# Patient Record
Sex: Male | Born: 1945
Health system: Southern US, Community
[De-identification: ages and names within clinical notes are randomized; demographics above are authoritative.]

## PROBLEM LIST (undated history)

## (undated) DIAGNOSIS — E785 Hyperlipidemia, unspecified: Secondary | ICD-10-CM

## (undated) DIAGNOSIS — I519 Heart disease, unspecified: Secondary | ICD-10-CM

## (undated) DIAGNOSIS — Z951 Presence of aortocoronary bypass graft: Secondary | ICD-10-CM

## (undated) DIAGNOSIS — Z860100 Personal history of colon polyps, unspecified: Secondary | ICD-10-CM

## (undated) DIAGNOSIS — M2540 Effusion, unspecified joint: Secondary | ICD-10-CM

## (undated) DIAGNOSIS — Z8669 Personal history of other diseases of the nervous system and sense organs: Secondary | ICD-10-CM

## (undated) DIAGNOSIS — I255 Ischemic cardiomyopathy: Secondary | ICD-10-CM

## (undated) DIAGNOSIS — Z95810 Presence of automatic (implantable) cardiac defibrillator: Secondary | ICD-10-CM

## (undated) DIAGNOSIS — I10 Essential (primary) hypertension: Secondary | ICD-10-CM

## (undated) DIAGNOSIS — J189 Pneumonia, unspecified organism: Secondary | ICD-10-CM

## (undated) DIAGNOSIS — I714 Abdominal aortic aneurysm, without rupture, unspecified: Secondary | ICD-10-CM

## (undated) DIAGNOSIS — K219 Gastro-esophageal reflux disease without esophagitis: Secondary | ICD-10-CM

## (undated) DIAGNOSIS — M1990 Unspecified osteoarthritis, unspecified site: Secondary | ICD-10-CM

## (undated) DIAGNOSIS — Z8619 Personal history of other infectious and parasitic diseases: Secondary | ICD-10-CM

## (undated) DIAGNOSIS — D649 Anemia, unspecified: Secondary | ICD-10-CM

## (undated) DIAGNOSIS — I2510 Atherosclerotic heart disease of native coronary artery without angina pectoris: Secondary | ICD-10-CM

## (undated) DIAGNOSIS — K5790 Diverticulosis of intestine, part unspecified, without perforation or abscess without bleeding: Secondary | ICD-10-CM

## (undated) DIAGNOSIS — E119 Type 2 diabetes mellitus without complications: Secondary | ICD-10-CM

## (undated) DIAGNOSIS — IMO0001 Reserved for inherently not codable concepts without codable children: Secondary | ICD-10-CM

## (undated) DIAGNOSIS — I219 Acute myocardial infarction, unspecified: Secondary | ICD-10-CM

## (undated) HISTORY — DX: Atherosclerotic heart disease of native coronary artery without angina pectoris: I25.10

## (undated) HISTORY — DX: Presence of automatic (implantable) cardiac defibrillator: Z95.810

## (undated) HISTORY — DX: Hyperlipidemia, unspecified: E78.5

## (undated) HISTORY — DX: Acute myocardial infarction, unspecified: I21.9

## (undated) HISTORY — DX: Abdominal aortic aneurysm, without rupture, unspecified: I71.40

## (undated) HISTORY — DX: Type 2 diabetes mellitus without complications: E11.9

## (undated) HISTORY — DX: Ischemic cardiomyopathy: I25.5

## (undated) HISTORY — DX: Essential (primary) hypertension: I10

## (undated) HISTORY — DX: Heart disease, unspecified: I51.9

## (undated) HISTORY — DX: Abdominal aortic aneurysm, without rupture: I71.4

## (undated) HISTORY — DX: Presence of aortocoronary bypass graft: Z95.1

## (undated) HISTORY — PX: DOPPLER ECHOCARDIOGRAPHY: SHX263

---

## 1999-04-19 DIAGNOSIS — I219 Acute myocardial infarction, unspecified: Secondary | ICD-10-CM

## 1999-04-19 HISTORY — DX: Acute myocardial infarction, unspecified: I21.9

## 1999-04-19 HISTORY — PX: CORONARY ARTERY BYPASS GRAFT: SHX141

## 1999-04-19 HISTORY — PX: CARDIAC CATHETERIZATION: SHX172

## 2000-01-11 ENCOUNTER — Encounter: Payer: Self-pay | Admitting: Cardiovascular Disease

## 2000-01-11 ENCOUNTER — Inpatient Hospital Stay (HOSPITAL_COMMUNITY): Admission: AD | Admit: 2000-01-11 | Discharge: 2000-01-20 | Payer: Self-pay | Admitting: Cardiovascular Disease

## 2000-01-14 ENCOUNTER — Encounter: Payer: Self-pay | Admitting: Thoracic Surgery (Cardiothoracic Vascular Surgery)

## 2000-01-15 ENCOUNTER — Encounter: Payer: Self-pay | Admitting: Thoracic Surgery (Cardiothoracic Vascular Surgery)

## 2000-01-16 ENCOUNTER — Encounter: Payer: Self-pay | Admitting: Cardiothoracic Surgery

## 2000-01-16 ENCOUNTER — Encounter: Payer: Self-pay | Admitting: Thoracic Surgery (Cardiothoracic Vascular Surgery)

## 2000-01-17 ENCOUNTER — Encounter: Payer: Self-pay | Admitting: Cardiothoracic Surgery

## 2000-01-18 ENCOUNTER — Encounter: Payer: Self-pay | Admitting: Thoracic Surgery (Cardiothoracic Vascular Surgery)

## 2001-10-27 ENCOUNTER — Emergency Department (HOSPITAL_COMMUNITY): Admission: EM | Admit: 2001-10-27 | Discharge: 2001-10-27 | Payer: Self-pay | Admitting: *Deleted

## 2001-10-27 ENCOUNTER — Encounter: Payer: Self-pay | Admitting: *Deleted

## 2001-10-28 ENCOUNTER — Encounter: Payer: Self-pay | Admitting: Internal Medicine

## 2001-10-28 ENCOUNTER — Encounter (INDEPENDENT_AMBULATORY_CARE_PROVIDER_SITE_OTHER): Payer: Self-pay | Admitting: Specialist

## 2001-10-28 ENCOUNTER — Inpatient Hospital Stay (HOSPITAL_COMMUNITY): Admission: EM | Admit: 2001-10-28 | Discharge: 2001-11-13 | Payer: Self-pay | Admitting: Pulmonary Disease

## 2001-10-29 ENCOUNTER — Encounter: Payer: Self-pay | Admitting: Pulmonary Disease

## 2001-10-30 ENCOUNTER — Encounter: Payer: Self-pay | Admitting: Pulmonary Disease

## 2001-10-31 ENCOUNTER — Encounter (INDEPENDENT_AMBULATORY_CARE_PROVIDER_SITE_OTHER): Payer: Self-pay | Admitting: Cardiology

## 2001-10-31 ENCOUNTER — Encounter: Payer: Self-pay | Admitting: Pulmonary Disease

## 2001-11-01 ENCOUNTER — Encounter: Payer: Self-pay | Admitting: Pulmonary Disease

## 2001-11-02 ENCOUNTER — Encounter: Payer: Self-pay | Admitting: Pulmonary Disease

## 2001-11-03 ENCOUNTER — Encounter: Payer: Self-pay | Admitting: Pulmonary Disease

## 2001-11-05 ENCOUNTER — Encounter: Payer: Self-pay | Admitting: Pulmonary Disease

## 2001-11-06 ENCOUNTER — Encounter: Payer: Self-pay | Admitting: Pulmonary Disease

## 2001-11-07 ENCOUNTER — Encounter: Payer: Self-pay | Admitting: Pulmonary Disease

## 2001-11-08 ENCOUNTER — Encounter: Payer: Self-pay | Admitting: Pulmonary Disease

## 2001-11-12 ENCOUNTER — Encounter: Payer: Self-pay | Admitting: Pulmonary Disease

## 2001-11-13 ENCOUNTER — Encounter: Payer: Self-pay | Admitting: Pulmonary Disease

## 2003-09-03 ENCOUNTER — Ambulatory Visit (HOSPITAL_COMMUNITY): Admission: RE | Admit: 2003-09-03 | Discharge: 2003-09-03 | Payer: Self-pay | Admitting: Internal Medicine

## 2006-03-29 ENCOUNTER — Encounter (HOSPITAL_COMMUNITY): Admission: RE | Admit: 2006-03-29 | Discharge: 2006-04-17 | Payer: Self-pay | Admitting: Internal Medicine

## 2006-09-08 ENCOUNTER — Ambulatory Visit (HOSPITAL_COMMUNITY): Admission: RE | Admit: 2006-09-08 | Discharge: 2006-09-08 | Payer: Self-pay | Admitting: Cardiovascular Disease

## 2010-06-29 ENCOUNTER — Other Ambulatory Visit: Payer: Self-pay | Admitting: Cardiovascular Disease

## 2010-06-29 DIAGNOSIS — I719 Aortic aneurysm of unspecified site, without rupture: Secondary | ICD-10-CM

## 2010-07-08 ENCOUNTER — Ambulatory Visit
Admission: RE | Admit: 2010-07-08 | Discharge: 2010-07-08 | Disposition: A | Discharge status: Home / Self Care | Payer: Medicare Other | Source: Ambulatory Visit | Attending: Cardiovascular Disease | Admitting: Cardiovascular Disease

## 2010-07-08 DIAGNOSIS — I719 Aortic aneurysm of unspecified site, without rupture: Secondary | ICD-10-CM

## 2010-07-08 MED ORDER — IOHEXOL 300 MG/ML  SOLN
100.0000 mL | Freq: Once | INTRAMUSCULAR | Status: AC | PRN
  Administered 2010-07-08: 100 mL via INTRAVENOUS

## 2010-08-24 ENCOUNTER — Encounter (INDEPENDENT_AMBULATORY_CARE_PROVIDER_SITE_OTHER): Payer: Medicare Other | Admitting: Vascular Surgery

## 2010-08-24 DIAGNOSIS — I714 Abdominal aortic aneurysm, without rupture: Secondary | ICD-10-CM

## 2010-08-25 NOTE — Consult Note (Signed)
NEW PATIENT CONSULTATION  SAXTON, CHAIN DOB:  24-Mar-1946                                       08/24/2010 EAVWU#:[id number]  Patient presents today for discussion regarding infrarenal abdominal aortic aneurysm.  This was known at the time of his coronary bypass grafting in 2001 and has been followed with serial ultrasounds.  He did have an ultrasound scan in March suggestion possibly up to 5.3 cm aneurysm and had a CT for further follow-up of this showing 5.0 cm.  I have reviewed the CT scan images.  This is comparable to a study in 2008 with the CT showing a 5 cm aneurysm at that time as well.  He has no symptoms referable to this.  His history is significant for non-insulin diabetes, prior myocardial infarction prior to his coronary artery bypass grafting in 2001.  History of congestive heart failure and defibrillator placement.  SOCIAL HISTORY:  He is widowed with 1 child.  He is retired.  He does not smoke, having quit in 2001.  Does not drink alcohol.  FAMILY HISTORY:  Significant for heart valve replacement in a brother. Coronary artery bypass grafting in father.  REVIEW OF SYSTEMS:  No weight loss or gain.  He weighs 207 pounds.  He is 6 feet tall. CARDIAC:  Positive for shortness of breath with exertion. GI:  Reflux. Otherwise normal.  PHYSICAL EXAMINATION:  A well-developed and well-nourished white male appearing his stated age in no acute distress.  Blood pressure is 141/81, pulse 68, respirations 12.  HEENT:  Normal.  Chest:  Clear bilaterally without rales, rhonchi or wheezes.  Heart:  He has normal carotid pulsation bilaterally without bruits.  A regular rate and rhythm of the heart.  His radial pulses are 2+ bilaterally.  He has 2+ popliteal and 2+ posterior tibial pulses without evidence of peripheral aneurysms.  Abdomen:  Moderate obese.  I do not feel an aneurysm.  No masses noted.  No tenderness.  Musculoskeletal shows no  major deformities or cyanosis.  Neurologic:  No focal weakness or paresthesias.  Skin without ulcers or rashes.  I reviewed his scan with patient in detail.  I explained that it is reassuring that his CT scan has not changed since the study in 2008.  I have recommended that he have serial 56-month follow-up.  He is doing this at the time of his 19-month evaluations with Dr. Alanda Amass and will continue to do this.  We would request that Dr. Alanda Amass notify us should he have any increase in the size of this.  CT suggests that he does have probable appropriate anatomy for stent grafting, which would be a candidate.  We will him if he has progression of size on serial ultrasounds in follow-up with Dr. Alanda Amass.    Larina Earthly, M.D. Electronically Signed  TFE/MEDQ  D:  08/24/2010  T:  08/25/2010  Job:  [id number]  cc:   Madelin Rear. Sherwood Gambler, MD Richard A. Alanda Amass, M.D.

## 2010-09-03 NOTE — Discharge Summary (Signed)
[name]. [facility]  Patient:    Jordan Ross, Jordan Ross                        MRN: [id number] Adm. Date:  [id number] Disc. Date: 01/20/00 Attending:  Tressie Stalker Dictator:   Lissa Merlin, P.A. CC:         Lennette Bihari, M.D.  Dr. Quinn Axe A. Alanda Amass, M.D.  Oley Balm Sung Amabile, M.D. [facility]   Discharge Summary  DATE OF BIRTH:  30-Apr-1945  SURGEON:  Dr. Tressie Stalker.  CARDIOLOGIST:  Dr. Tresa Endo.  PRIMARY CARE:  Dr. Jodelle Green.  CARDIAC CATHETERIZATION:  Dr. Alanda Amass.  PULMONARY:  Dr. Sung Amabile.  ADMISSION DIAGNOSES: 1. Acute myocardial infarction. 2. Class IV post-infarction angina. 3. Class IV congestive heart failure.  DISCHARGE DIAGNOSES: 1. Severe three vessel coronary artery disease with moderate to severe left    ventricular dysfunction with ejection fraction 25-30% per cardiac    catheterization on January 11, 2000. 2. Status post coronary artery bypass grafting x 4 on January 14, 2000, with    the following grafts:  Left internal mammary artery to left anterior    descending, sequential saphenous vein graft from ramus intermediate to    circumflex, saphenous vein graft to posterior descending artery.    Intraoperative transesophageal echocardiogram showed bicuspid aortic valve. 3. Postoperative pneumonia versus adult respiratory distress syndrome, treated    with antibiotics and steroids, improved. 4. Steroid-induced hyperglycemia, treated with sliding scale insulin,    stable/improved. 5. Bilateral renal artery stenosis per cardiac catheterization.  PAST MEDICAL HISTORY: 1. No prior history of CAD. 2. Smoking 1-1/2 packs per day x 40 years. 3. Peptic ulcer disease.  HISTORY OF PRESENT ILLNESS:  This is a 65 year old white male who relates substernal chest pressure beginning the evening of January 09, 2000, which occurred through January 10, 2000.  He was seen by his primary care physician on January 11, 2000.  He was  diagnosed with acute MI.  He was transferred to [facility] CCU via EMS.  He had substernal chest pain at rest and EKG changes.  He underwent cardiac catheterization by Dr. Susa Griffins, which showed severe three vessel coronary disease with moderate to severe left ventricular dysfunction.  Cardiac surgical consultation was obtained.  Dr. Cornelius Moras evaluated Mr. Rocks and the cardiac catheterization data. He agreed that CABG was the best treatment alternative.  The risks, benefits, details, and alternatives of surgery were discussed, and it was agreed to proceed.  [name] underwent surgery with no complications on January 14, 2000.  He was off coronary pulmonary bypass, in normal sinus rhythm on low-dose milrinone.  Intraoperative TEE showed bicuspid aortic valve.  There was no aortic insufficiency or aortic stenosis.  He was transported to the SICU in stable condition.  On postoperative day #1, Mr. Delapena was noted to have some wheezing.  He was kept in the unit to work on his respiratory function.  On postoperative day #2, it became apparent that his respiratory status was compromised.  ARDS was suspected.  A pulmonary consult was ordered. Dr. Sung Amabile evaluated [name] and concluded that he had ARDS versus pneumonia.  He was put on broad antibiotic coverage, bronchodilators, and steroids.  He was kept in the unit.  He was otherwise doing well, awake and alert.  Laboratory work was satisfactory with the exception of an elevated WBC and blood glucose.  This was attributed to steroids.  [name] has no  previous history of diabetes.  [name] was requiring face mask 40% initially.  This was gradually weaned, and he had begun to make a quick improvement.  By January 18, 2000, he was feeling well, afebrile, in normal sinus rhythm.  He was 94-98% on 4 L nasal cannula.  Incisions were healing well.  There was an opacity on the chest x-ray which showed improvement.  He was deemed  suitable to transfer to Unit 2000.  On January 19, 2000, postoperative day #5, [name] is doing well on Unit 2000.  He feels well. He has no shortness of breath.  He still has a mild brown productive cough. He is walking.  He is taking p.o. well.  His bowel and bladder are functioning well.  His blood pressure is stable on the low side at 90/60.  He is 69, in sinus rhythm on telemetry and 92% on room air.  Laboratory work is satisfactory.  Physical exam is satisfactory.  His incisions are healing well. He has some mild wheezes on exam.  He is back down to his preoperative weight. Lasix is discontinued today.  Sputum culture returns with normal flora.  He is doing quite well today and is anticipated to be discharged home pending satisfactory morning rounds on Thursday, January 20, 2000.  MEDICATIONS: 1. Enteric-coated aspirin 325 mg 1 p.o. q.a.m. 2. Tenormin 25 mg tablet 1/2 tablet p.o. q.12h. 3. Altace 1.25 mg 1 p.o. b.i.d. 4. Lipitor 10 mg 1 p.o. q.h.s. 5. Tequin 400 mg 1 p.o. q.d. x 4 days. 6. Combivent MDI 2 puffs b.i.d. x 7 days. 7. Pulmicort MDI 2 puffs b.i.d. x 7 days. 8. Percocet 1-2 p.o. q.4-6h. p.r.n. for pain.  ALLERGIES:  No known drug allergies.  SPECIAL INSTRUCTIONS:  [name] is told to do no driving, no lifting more than 10 pounds.  He is to maintain a low fat diet.  He is told he can shower. Use mild soap and water only.  To call the office if he notices anything unusual with his wound such as redness, swelling, drainage, or fever.  He is told to get a chest x-ray when he sees Dr. Tresa Endo in follow-up and to bring it with him when he sees Dr. Cornelius Moras.  FOLLOW-UP: 1. Mr. Sayre is instructed to call Dr. Tresa Endo after discharge to arrange a    two-week follow-up appointment.  2. He will see Dr. Cornelius Moras in around three weeks.  The office will call with the    day and time. DD:  01/19/00 TD:  01/20/00 Job: [id number]

## 2010-09-03 NOTE — Procedures (Signed)
[place]. [facility]  Patient:    Jordan Ross, Jordan Ross Visit Number: [id number] MRN: [id number]          Service Type: EMS Location: ED Attending Physician:  [name]. Dictated by:   Danice Goltz, M.D. Proc. Date: 10/30/01 Admit Date:  10/28/2001 Discharge Date: 10/28/2001                             Procedure Report  DATE OF BIRTH:  1945-11-03.  PROCEDURE:  Bedside fiberoptic bronchoscopy.  CLINICAL NOTE:  Mr. Jarrells is a 65 year old gentleman with bilateral diffuse infiltrates and severe hypoxemia that required emergent intubation this morning.  Bronchoscopy is being done to evaluate pulmonary infiltrates, whether these are infectious versus active alveolitis.  DESCRIPTION OF PROCEDURE:  The patient was receiving ventilatory support.  He had an 8.5 endotracheal tube in place.  A Portex adapter for the bronchoscope was placed.  The patient was maintained on mechanical ventilation throughout the procedure.  The Pentax 18 French fiberoptic bronchoscope was then passed via the Portex adapter and the ET tube was found to be 4 cm above the carina. The carina looked sharp.  The bronchial tree was then examined, the right tracheobronchial tree to include right upper lobe, right middle lobe, and right lower lobe, all looked clean without much inflammation.  The patient had scattered small mucus plugs that were suctioned.  The bronchoscope was then retracted to the carina and then advanced to the left mainstem bronchus.  The left upper lobe, lingula, and lower lobe subsegments again looked clean and without any undue inflammation.  There were no purulent secretions noted except scattered mucus plugs, and these were suctioned.  At this point the bronchoscope was then wedged on the right middle lobe and we proceeded to do bronchoalveolar lavage with 20 cc aliquots of saline and a total of 40 cc of BAL returned after 40 cc was instilled.  This was  sent for analysis.  IMPRESSION:  Bilateral diffuse infiltrates which by bronchoscopy do not appear to be infectious.  PLAN:  Send fluid for cell count, T-cell subsets, cytology, and cultures to include PCP.  Further plan pending results of the above. Dictated by:   Danice Goltz, M.D. Attending Physician:  [name] DD:  10/30/01 TD:  11/01/01 Job: [id number]

## 2010-09-03 NOTE — H&P (Signed)
[place]  Patient:    Jordan Ross, Jordan Ross Visit Number: [id number] MRN: [id number]          Service Type: EMS Location: ED Attending Physician:  [name]. Dictated by:   Oley Balm Sung Amabile, M.D. [facility] Admit Date:  10/28/2001 Discharge Date: 10/28/2001   CC:         Elfredia Nevins, M.D.  Richard A. Alanda Amass, M.D.   History and Physical  DATE OF BIRTH:  1946/02/21  ADMISSION DIAGNOSES: 1. Hypoxic respiratory failure. 2. Pneumonia. 3. History of coronary artery disease. 4. Smoker.  HISTORY OF PRESENT ILLNESS:  The patient is a 65 year old gentleman with a past medical history notable for coronary artery disease as documented below. He presented to [facility] emergency department on the day prior to this admission with a week of symptoms.  His symptoms included myalgias, subjective fever, shortness of breath, purulent sputum (yellow), and dry mouth.  He was found to have bilateral pulmonary infiltrates.  He was seen by Dr. Elfredia Nevins and apparently was given antibiotics for this.  He was told to follow up on the day of this admission and upon followup, was noted to be mildly hypotensive with systolic pressures in the 80s; he was also noted to be hypoxic with oxygen saturations in the 80s as well.  He was given Lasix with some improvement in his symptoms.  Chest x-ray continued to demonstrate bilateral pulmonary infiltrates.  He was transferred here for further evaluation and management.  Upon arrival, he was fully alert and awake and appropriate.  He denies pleuritic and anginal chest pain.  His sputum is now brown but he denies frank hemoptysis.  He denies headache and sinus symptoms. No nausea, vomiting, diarrhea or dysuria.  No lower extremity edema or calf tenderness.  No paroxysmal nocturnal dyspnea nor orthopnea.  PAST MEDICAL HISTORY:  Status post acute myocardial infarction.  Status post coronary artery  bypass surgery in September of 2001.  His cardiologist is Dr. Pearletha Furl. Alanda Amass.  His surgeon was Dr. Purcell Nails.  He has no documented history of pulmonary disease, though hospital records indicate that he did have problems with bronchospasm and ARDS after bypass surgery.  MEDICATIONS:  These are unknown.  He states that he takes four or five medications for his heart.  Hospital records from 2001 indicate that he was discharged home on enteric-coated aspirin, Tenormin and Altace.   SOCIAL HISTORY:  He continues to smoke one and a half packs of cigarettes per day.  He is retired.  He has no significant history of occupational exposures.  FAMILY HISTORY:  Noncontributory.  REVIEW OF SYSTEMS:  Review of systems is as per the history of present illness.  PHYSICAL EXAMINATION:  VITAL SIGNS:  Temperature 97.0, blood pressure 120/60, pulse 90 and regular, respirations 25 and unlabored.  Oxygen saturation is 90-94% on 6 L by nasal cannula.  GENERAL:  He is well-developed, well-nourished, in no acute cardiac or respiratory distress.  HEENT:  No acute abnormalities.  NECK:  Supple without adenopathy or jugular venous distention.  Thyroid gland is normal.  CHEST:  Examination reveals dullness to percussion in the left base.  He has diffuse rhonchi and bronchial breath sounds posteriorly in the left base. There are a few scattered wheezes heard anteriorly.  CARDIAC:  Exam reveals a regular rate and rhythm with a 1/6 systolic murmur heard intermittently at the left lower sternal border.  At times, this has a blowing  quality suggestive of mitral regurgitation, however, the murmur is nonradiating.  ABDOMEN:  Soft with normal bowel sounds.  Liver is palpable at the right costal margin.  Spleen is not palpable.  There are no masses.  EXTREMITIES:  No clubbing, cyanosis, or edema.  NEUROLOGIC:  No focal deficits.  DATA:  Chest x-rays from [facility] are poor quality  as they are copies.  They seem to indicate bilateral infiltrates, most significantly in the left lower lobe.  Cardiac silhouette is normal.  Laboratory data from [facility] reveal a B natriuretic peptide of 476.  Cardiac panel is normal.  Comprehensive metabolic panel reveals a potassium of 3.0 and a glucose of 190.  Rest of the chemistries are normal or unremarkable.  CBC reveals a hemoglobin of 11.9, hematocrit 34.4, platelet count 240,000, white blood cell count of 10,700 with predominance of neutrophils.  IMPRESSION: 1. Hypoxic respiratory failure secondary to left lower lobe pneumonia.  This    occurs after a week of viral-type symptoms and might represent a post-viral    pneumonia.  He will be covered broadly with ceftriaxone and azithromycin    pending sputum cultures data. 2. History of coronary artery disease -- he will be continue on his cardiac    medications and these will be clarified.  Dr. Alanda Amass will be notified of    his hospitalization. 3. Hypokalemia -- supplement. 4. Hyperglycemia -- follow.  Provide sliding-scale insulin as needed for    glucoses over 200. 5. Smoker -- I have counseled him extensively regarding the need for smoking    cessation, given his underlying lung disease and cardiac disease.  He will    be covered with empiric bronchodilators.  No steroids will be provided in    the face of an acute infection and hyperglycemia. Dictated by:   Oley Balm Sung Amabile, M.D. [facility] Attending Physician:  [name] DD:  10/28/01 TD:  10/30/01 Job: [id number]

## 2010-09-03 NOTE — Op Note (Signed)
   NAME:  Jordan Ross, Jordan Ross                         ACCOUNT NO.:  [id number]   MEDICAL RECORD NO.:  [id number]                   PATIENT TYPE:  INP   LOCATION:  3728                                 FACILITY:  [facility]   PHYSICIAN:  Cristy Hilts. Jacinto Halim, M.D.                  DATE OF BIRTH:  Aug 27, 1945   DATE OF PROCEDURE:  DATE OF DISCHARGE:  11/13/2001                                 OPERATIVE REPORT   PROCEDURE:  Adenosine Cardiolite stress test.   INDICATIONS FOR PROCEDURE:  [name] is a 65 year old gentleman who was  admitted to the hospital with pneumonia and congestive heart failure.  He  does have significant cardiac history and was found to have an abnormal  echocardiogram suggestive of papillary muscle dysfunction.  A Cardiolite  stress test is being performed to evaluate for suspected progression of  coronary artery disease given recurrent pulmonary edema.   STRESS DATA:  Resting ECG demonstrates normal sinus rhythm, normal axis.  There are Q-waves noted in V1 through V3 suggestive of old anterior wall  myocardial infarction.   Resting heart rate is 67 beats per minute, blood pressure of 100/68 mmHg.  With adenosine infusion, the maximum heart rate was 92 beats per minute with  a blood pressure of 80/67 mmHg.  With adenosine infusion there was no ST-T  wave change or ischemia.  The patient complained of flushing, which rapidly  resolved and did not recur.  He had occasional PVCs noted.   FINAL INTERPRETATION:  1. Resting electrocardiogram demonstrates sinus rhythm with left atrial     abnormality.  Q-waves in V1 through V3 suggestive of old anterior wall     myocardial infarction.  2. Stress electrocardiogram was nondiagnostic for ischemia as a     pharmacological stress test.  3. Nuclear images are pending.                                               Cristy Hilts. Jacinto Halim, M.D.    Pilar Plate  D:  11/12/2001  T:  11/16/2001  Job:  [id number]   cc:   Oley Balm. Sung Amabile, M.D. [facility]

## 2010-09-03 NOTE — Op Note (Signed)
[place]. [facility]  Patient:    Jordan Ross, Jordan Ross                        MRN: [id number] Proc. Date: 01/14/00 Adm. Date:  [id number] Attending:  Tressie Stalker CC:         Richard A. Alanda Amass, M.D.  Lennette Bihari, M.D.  Dr. Luciana Axe  CVTS office   Operative Report  PREOPERATIVE DIAGNOSIS:  Severe three vessel coronary artery disease, status post acute myocardial infarction with class 4 post-infarction angina and class 4 congestive heart failure.  POSTOPERATIVE DIAGNOSIS:  Severe three vessel coronary artery disease, status post acute myocardial infarction with class 4 post-infarction angina and class 4 congestive heart failure.  PROCEDURE:  Median sternotomy for coronary artery bypass grafting x 4 (left internal mammary artery to distal left anterior descending coronary artery, saphenous vein graft to ramus intermediate branch and sequential saphenous vein graft to circumflex marginal branch, saphenous vein graft to posterior descending coronary artery).  SURGEON:  Salvatore Decent. Cornelius Moras, M.D.  ASSISTANT:  Lissa Merlin, P.A.  ANESTHESIA:  General.  BRIEF CLINICAL NOTE:  The patient is a 65 year old gentleman from [place], followed by Dr. Luciana Axe, and referred by Dr. Susa Griffins and Dr. Nicki Guadalajara for management of coronary artery disease.  The patient has no documented previous history of coronary artery disease, but does have a history of hypertension, tobacco abuse, and a family history of coronary artery disease.  He presents with two day history of symptoms of severe substernal chest pain occurring at rest, and electrocardiogram and EKG demonstrate findings consistent with acute myocardial infarction.  the patient was admitted and underwent cardiac catheterization by Dr. Susa Griffins which demonstrates severe three vessel coronary artery disease with moderate to severe left ventricular dysfunction.  Cardiac surgical  consultation was obtained.  The patient and his family are counseled at length regarding the indications and potential benefits of coronary artery bypass grafting.  They understand the associated risks of surgery including, but not limited to risks of death, stroke, myocardial infarction, congestive heart failure, bleeding requiring blood transfusion, arrhythmia, infection, and recurrent coronary artery disease.  They accept these risks as well as any unforeseen complications, and agreed to proceed with surgery as described.  DESCRIPTION OF PROCEDURE:  The patient was brought to the operating room on the above mentioned date and invasive hemodynamic monitoring was established by the anesthesia service under the care and direction of Burna Forts, M.D.  The patient was placed in the supine position on the operating table. Initial pulmonary artery pressures are noted to be in the 60s with systemic arterial pressure in the 90s.  Following induction of general endotracheal anesthesia, transesophageal echocardiogram was performed by Dr. Jacklynn Bue.  This demonstrates severe left ventricular dysfunction with overall ejection fraction estimated at 30%. There is severe akinesis of the distal anterior wall of the left ventricle, as well as the entire anterior apical portion, the septum, and a majority of the lateral wall of the left ventricle.  There is trivial mitral regurgitation. There is a bicuspid aortic valve.  There is no sign of any aortic insufficiency nor aortic stenosis.  No other abnormalities were identified.  The patients chest, abdomen, both groins, and both lower extremities are prepared and draped in the usual sterile manner.  Intravenous antibiotics are administered.  A median sternotomy incision is performed and the left internal mammary artery is dissected from the chest wall and prepared for  bypass grafting.  The left internal mammary artery is felt to be a good  quality conduit for bypass grafting.  Simultaneously, saphenous vein was obtained from the patients left leg through a series of longitudinal incisions.  The saphenous vein is felt to be a good quality conduit for bypass grafting.  The patient was heparinized systemically.  The pericardium was opened.  The ascending aorta is palpated normal without any palpable plaques or calcifications.  The ascending aorta is somewhat rotated and the heart is somewhat rotated due to its massive size.  The ascending aorta and the right atrial appendage are cannulated without difficulty.  Adequate heparinization is verified.  Cardiopulmonary bypass was begun and the surface of the heart was inspected. There is severe left ventricular dysfunction with moderate left ventricular hypertrophy.  The majority of the distal anterior wall and apex of the heart appeared severely damaged with likely recent myocardial infarction on top of chronic scarring.  There is scarring involving the lateral wall of the left ventricle as well.  Distal sites are selected for coronary bypass grafting. Portions of the saphenous vein and the left internal mammary artery are trimmed to appropriate length.  A temperature probe was placed in the left ventricular septum and a styrofoam pad is placed to protect the left phrenic nerve from thermal injury.  A cardioplegia catheter was placed in the ascending aorta.  The patient was cooled to 28 degrees systemic temperature.  The aortic cross-clamp was applied and cardioplegia was delivered in an antegrade fashion through the aortic root.  Iced saline slush is applied for topical hypothermia.  Myocardial cooling is somewhat slow requiring a large dose of cardioplegia to ascertain adequate cooling.  Repeat doses of cardioplegia are  administered both through the aortic root and down subsequently placed vein grafts intermittently throughout the cross-clamp portion of the operation. The  following distal coronary anastomoses are performed:  #1 - The posterior descending coronary artery is grafted with a saphenous vein graft in an end-to-side fashion using running 7-0 Prolene suture.  This coronary measures 1.5 mm in diameter and arises from the distal left circumflex system.  It is of good quality at the site of distal bypass.  #2 - The ramus intermediate branch is graft with a saphenous vein graft in a side-to-side fashion using running 7-0 Prolene suture.  This coronary measured 1.4 mm in diameter and is of good quality.  #3 - The second circumflex marginal branch was grafted with a sequential saphenous vein graft using the vein placed to the ramus intermediate branch. This coronary measures 1.6 mm in diameter and is of good quality.  It is chronically occluded proximally.  The third circumflex marginal branch is somewhat smaller and is not grafted, and appears to be chronically occluded.  #4 - The distal left anterior descending coronary artery is grafted with the left internal mammary artery using a running 8-0 Prolene suture.  This coronary measures 2.0 mm in diameter and is of fair to good quality at the site of distal bypass.  Both proximal saphenous vein anastomoses are performed to the ascending aorta prior to removal of the aortic cross-clamp.  The septal temperature is noted to rise rapidly and dramatically upon reperfusion of the left internal mammary artery.  All air is evacuated from the aortic root.  The aortic cross-clamp is removed after a total cross-clamp time of 70 minutes.  The heart began to beat spontaneously without need for cardioversion.  All proximal and distal anastomoses are inspected  for hemostasis and appropriate graft orientation.  The saphenous vein to the ramus intermediate and circumflex marginal branch is felt to be a little bit short with a slight amount of tension applied upon it as it crosses the pulmonary artery due to the  patients massive size of the left heart with filling.  Therefore, a small interposition segment of saphenous vein was added to this vein proximally under a separate partial occlusion clamp.  Epicardial pacing wires were affixed to the right ventricular outflow tract and to the right atrial appendage.  The patient was weaned from cardiopulmonary without difficulty. The patients rhythm at separation from bypass is sinus tachycardia.  Low dose milrinone infusion is maintained.  Total cardiopulmonary bypass time for the operation is 117 minutes.  The venous and arterial cannulae are removed uneventfully.  Protamine is administered to reverse the anticoagulation.  Followup transesophageal echocardiogram performed after separation from bypass and demonstrates essentially no change in the left ventricular function.  No other abnormalities are identified.  The mediastinum and the left chest are irrigated with saline solution containing vancomycin.  Meticulous surgical hemostasis was ascertained.  The mediastinum and the left chest are drained with three chest tubes placed through separate stab incisions inferiorly.  The median sternotomy is closed in a routine fashion.  The left lower extremity incisions are closed in multiple layers in the routine fashion.  All skin incisions are closed with a subcuticular skin closure.  The patient tolerated the procedure well and was transported to the surgical intensive care unit in stable condition.  There were no intraoperative complications.  All sponge, instrument, and needle counts were verified correct at completion of the operation.  No autologous blood products were administered. DD:  01/14/00 TD:  01/15/00 Job: [id number]

## 2010-09-03 NOTE — Procedures (Signed)
[place]. [facility]  Patient:    Jordan Ross, Jordan Ross Visit Number: [id number] MRN: [id number]          Service Type: EMS Location: ED Attending Physician:  [name]. Dictated by:   Shela Commons. Claybon Jabs, M.D. Proc. Date: 10/30/01 Admit Date:  10/28/2001 Discharge Date: 10/28/2001                             Procedure Report  HISTORY OF PRESENT ILLNESS:  The patient is a 65 year old white male who is in the intensive care unit with increasing work of breathing and acute respiratory failure.  Barbaraann Share, M.D., consulted anesthesia for intubation because the patient was awake and alert and would require sedation during intubation.  I discussed the risks and benefits of the procedure with the patient who gave informed consent.  Upon my arrival the patient was resting comfortable.  Saturation was 88. Heart rate was 110, blood pressure 110/67.  The patient understood the purpose of the procedure and reported that he was NPO since midnight.  The patient received oxygen by mask and then 120 mg of Diprivan and 100 mg of succinylcholine were administered in a rapid sequence fashion.  The patient became unconscious, was ventilated with the mask.  The patient did begin to drop his saturation at this point and the patient then received direct laryngoscopy with a MAC III blade.  An 8.5 endotracheal tube was passed through the cords and taped at 23 cm at the lip.  The patient had bilateral breath sounds and saturation increased into the 90s following intubation.  Dr. __________ was there at bedside during the intubation and the patient was placed on the ventilator and sedated per the pulmonary critical care service orders.  Chest x-ray was also ordered and the patients saturation increased to 94.  Blood pressure 110/60 with a heart rate of 100.  The patient tolerated the procedure well. Dictated by:   Shela Commons. Claybon Jabs, M.D. Attending Physician:  [name] DD:  10/30/01 TD:  11/01/01 Job: [id number]

## 2010-09-03 NOTE — Discharge Summary (Signed)
NAME:  Jordan Ross, Jordan Ross                         ACCOUNT NO.:  [id number]   MEDICAL RECORD NO.:  [id number]                   PATIENT TYPE:  INP   LOCATION:  3728                                 FACILITY:  [facility]   PHYSICIAN:  Oley Balm. Sung Amabile, M.D. [facility]          DATE OF BIRTH:  [birth year]   DATE OF ADMISSION:  10/28/2001  DATE OF DISCHARGE:  11/13/2001                                 DISCHARGE SUMMARY   ADMISSION DIAGNOSES:  1. Hypoxic respiratory failure.  2. Left lower lobe pneumonia.  3. History of coronary artery disease.  4. Hypokalemia.  5. Hyperglycemia.  6. Smoker.   DISCHARGE DIAGNOSES:  1. Hypoxic respiratory failure.  2. Left lower lobe pneumonia.  3. History of coronary artery disease.  4. Hypokalemia.  5. Hyperglycemia.  6. Smoker.  7. Congestive heart failure.  8. Acute respiratory distress syndrome.  9. Upper gastrointestinal bleed with peptic ulcer disease due to stress     ulceration.  10.      Anemia secondary to above.  11.      Stress hyperglycemia.   HISTORY OF PRESENT ILLNESS:  The patient is a 65 year old gentleman with a  history of coronary artery disease.  He suffered a myocardial infarction in  September 2001 and underwent coronary artery bypass grafting.  Nonetheless,  he continued to smoke.  He did have a history of pneumonia.  He presented in  transfer on October 28, 2001 with hypoxic respiratory failure and a left lower  lobe infiltrate.  He was admitted to the intensive care unit due to mild  hypotension with a diagnosis of pneumonia.   HOSPITAL COURSE:  He was initially covered with ceftriaxone and  azithromycin.  Empiric bronchodilators were provided.  Intravenous fluids  were administered for hypotension.  For several days, he required high-flow  oxygen and ultimately required intubation on October 30, 2001.  At that time,  his chest x-ray had progressed to bilateral alveolar infiltrates consistent  with pulmonary edema versus ARDS.  He  underwent bronchoscopy on October 30, 2001 which was unrevealing.  All cultures were negative.  He developed upper  GI bleeding and was seen by [facility] on October 30, 2001.  Upper endoscopy demonstrated a duodenal ulcer and he was placed on  high-dosed Protonix for this.  He was started on corticosteroids for  presumed alveolitis versus ARDS versus late phase ARDS.  However, on November 01, 2001, Dr. Jayme Cloud felt that his picture was most consistent with  pulmonary edema and he was placed on dobutamine and underwent aggressive  diuresis.  With these interventions, his pulmonary infiltrates improved  dramatically.  He was seen by Dr. Kandis Cocking colleagues who assisted in the  management of congestive heart failure for the duration of the  hospitalization.  He underwent Swan-Ganz catheter placement on November 05, 2001  to better clarify his hemodynamics.  It was felt by that time that  his  hemodynamics had been optimized.  By 11/08/2001, he had passed a  spontaneous breathing trial and was extubated.  However, he developed  progressively increasing oxygen requirements for a couple of days after  extubation.  With further attention to management of congestive heart  failure, his oxygen requirements improved and he was transferred out of the  intensive care unit on November 11, 2001.  He underwent a Cardiolite study on  November 12, 2001 which demonstrated an ejection fraction of approximately 35%  with no inducible ischemia.  By the day of discharge, he was saturating at  95% on room air.  He was noted to be markedly weakened but with no other  acute medical problems requiring hospitalization.  His chest x-ray on this  day continues to show patchy bilateral infiltrates but these are resolving  compared to prior films.  Therefore, he is deemed much improved and ready  for discharge.   DISCHARGE MEDICATIONS:  1. Aspirin one a day.  2. Diovan 40 mg b.i.d.  3. Coreg 3.125 mg  b.i.d.  4. Digoxin 0.125 mg q.d.  5. Protonix 40 mg q.a.m. x6 weeks (through December 16, 2001).   DISCHARGE PLANNING:  He will be discharged home with home physical therapy  and nursing followup.  He is to call Dr. Kandis Cocking office for a followup  cardiology appointment in 2-3 weeks.  He is to call Dr. Sung Amabile' office for  a followup appointment at 4 weeks to include a repeat chest x-ray.                                               Oley Balm Sung Amabile, M.D. [facility]    DBS/MEDQ  D:  11/13/2001  T:  11/17/2001  Job:  [id number]   cc:   Madelin Rear. Sherwood Gambler, M.D.  [address]  [place]  [place] [address]  Fax: [phone]

## 2010-09-03 NOTE — Procedures (Signed)
[facility]  Patient:    Jordan Ross, Jordan Ross Visit Number: [id number] MRN: [id number]          Service Type: EMS Location: ED Attending Physician:  [name]. Dictated by:   Kari Baars, M.D. Proc. Date: 10/28/01 Admit Date:  10/28/2001 Discharge Date: 10/28/2001                            EKG Interpretations  DATE:  October 28, 2001, at 0648 hours.  PROCEDURE:  Electrocardiogram  PHYSICIAN:  Kari Baars, M.D.  DESCRIPTION OF PROCEDURE:  The rhythm is a sinus rhythm with a rate of about 90.  There are Q-waves inferiorly which may indicate a previous inferior myocardial infarction.  There are also Q-waves across the precordium, which may indicate a previous anterior myocardial infarction.  IMPRESSION:  Abnormal electrocardiogram.  Clinical correlation is suggested. Dictated by:   Kari Baars, M.D. Attending Physician:  [name] DD:  10/29/01 TD:  10/31/01 Job: [id number]

## 2010-09-03 NOTE — Procedures (Signed)
[place]. [facility]  Patient:    Jordan Ross, Jordan Ross                        MRN: [id number] Proc. Date: 01/14/00 Adm. Date:  [id number] Attending:  Tressie Stalker CC:         Anesthesia Department   Procedure Report  PROCEDURE:  Transesophageal echocardiogram.  ANESTHESIOLOGIST:  Burna Forts, M.D.  INDICATIONS:  Mr. Lori is a 65 year old gentleman, a patient of Dr. Kenn File, who presents today for a coronary artery bypass grafting by Dr. Purcell Nails.  He is known to have had recently a myocardial infarction, and the plan is for the placement of the transesophageal echocardiogram for evaluation of cardiac structures, as well as left ventricular function.  DESCRIPTION OF PROCEDURE:  The patient is brought to the holding area on the morning of surgery, where under local anesthesia and sedation, radial arterial and pulmonary lines are inserted.  He is then taken to the operating room for a routine induction of general anesthesia.  The trachea is intubated.  A TE probe is then lubricated and passed oropharyngeally into the stomach, and then withdrawn for imaging of the cardiac structures.  PRE-CARDIOPULMONARY BYPASS TRANSESOPHAGEAL EXAMINATION: Left ventricle:  There is markedly diminished function of the left ventricle in the short axis view.  There is a nearly akinetic anterior and anteroseptal wall portion.  There is a slightly dilated area at the apex which is also nearly akinetic.  The long axis view better demonstrates the akinetic apical area of this somewhat enlarged left ventricular chamber.  The papillary muscles are well-outlined.  There are no other masses, nor is there a clot noted within.  Mitral valve:  There is mild thickening of the mitral valve.  It appears, however, to open well and close appropriately, and collect in the appropriate fashion.  It is functionally normal.  On Doppler examination there is a trace mitral  regurgitant flow noted across this valve.  Left atrium:  This is an essentially normal left atrial chamber and the intra-atrial septum is again noted.  It is intact.  No flow could be demonstrated across its septum on Doppler examination.  The area appendage is seen.  No masses are noted within.  Aortic valve:  Of note, in the aortic valve initially is that it is a bicuspid valve.  There did appear to be originally three cusps, but there appears to be a confused raphe noted with a functionally bicuspid and bicuspid valve opening.  The opening is large enough so that it does not obstruct flow, nor is there any diastolic regurgitant jet noted on Doppler examination.  Overall there is no obstruction of flow during the systolic ejection, nor is there any regurgitant flow as previously mentioned, but it is definitely a bicuspid valve.  The ascending aorta just above that appears essentially normal.  Right ventricle, tricuspid valve, and right atrium:  Are normal in their appearance and function.  The patient was placed on cardiopulmonary bypass.  Hypothermia is instituted. The coronary artery bypass grafting is carried out.  The patient is then rewarmed and separated from cardiopulmonary bypass with the initial attempt.  POST-CARDIOPULMONARY BYPASS TRANSESOPHAGEAL EXAMINATION:  (Limited examination):  Left ventricle:  In the early bypass period the left ventricular chamber appears vigorous with the institution of inotropes, milrinone, and dopamine. There is good overall contractility noted in the lateral inferior, and a portion of the anterior wall.  The  anteroseptal area appears again as previously-described in the pre-cardiopulmonary bypass, with an akinetic portion of that anterior wall.  Overall there appears to be improved contractility compared to the pre-cardiopulmonary bypass condition.  The patient was returned to the cardiac intensive care unit in stable condition. DD:   01/15/00 TD:  01/15/00 Job: [id number]

## 2011-07-08 DIAGNOSIS — E291 Testicular hypofunction: Secondary | ICD-10-CM | POA: Diagnosis not present

## 2011-09-20 DIAGNOSIS — I6529 Occlusion and stenosis of unspecified carotid artery: Secondary | ICD-10-CM | POA: Diagnosis not present

## 2011-09-20 DIAGNOSIS — I714 Abdominal aortic aneurysm, without rupture: Secondary | ICD-10-CM | POA: Diagnosis not present

## 2011-10-24 DIAGNOSIS — E291 Testicular hypofunction: Secondary | ICD-10-CM | POA: Diagnosis not present

## 2011-11-30 DIAGNOSIS — E291 Testicular hypofunction: Secondary | ICD-10-CM | POA: Diagnosis not present

## 2011-12-17 DIAGNOSIS — G8929 Other chronic pain: Secondary | ICD-10-CM | POA: Diagnosis not present

## 2011-12-17 DIAGNOSIS — N529 Male erectile dysfunction, unspecified: Secondary | ICD-10-CM | POA: Diagnosis not present

## 2011-12-17 DIAGNOSIS — I10 Essential (primary) hypertension: Secondary | ICD-10-CM | POA: Diagnosis not present

## 2012-01-19 DIAGNOSIS — E291 Testicular hypofunction: Secondary | ICD-10-CM | POA: Diagnosis not present

## 2012-03-07 DIAGNOSIS — E291 Testicular hypofunction: Secondary | ICD-10-CM | POA: Diagnosis not present

## 2012-03-07 DIAGNOSIS — M6580 Other synovitis and tenosynovitis, unspecified site: Secondary | ICD-10-CM | POA: Diagnosis not present

## 2012-03-07 DIAGNOSIS — Z6829 Body mass index (BMI) 29.0-29.9, adult: Secondary | ICD-10-CM | POA: Diagnosis not present

## 2012-03-07 DIAGNOSIS — E1159 Type 2 diabetes mellitus with other circulatory complications: Secondary | ICD-10-CM | POA: Diagnosis not present

## 2012-03-07 DIAGNOSIS — E785 Hyperlipidemia, unspecified: Secondary | ICD-10-CM | POA: Diagnosis not present

## 2012-03-07 DIAGNOSIS — I10 Essential (primary) hypertension: Secondary | ICD-10-CM | POA: Diagnosis not present

## 2012-03-27 ENCOUNTER — Other Ambulatory Visit: Payer: Self-pay | Admitting: Cardiovascular Disease

## 2012-03-27 DIAGNOSIS — I714 Abdominal aortic aneurysm, without rupture: Secondary | ICD-10-CM

## 2012-03-27 DIAGNOSIS — I2510 Atherosclerotic heart disease of native coronary artery without angina pectoris: Secondary | ICD-10-CM | POA: Diagnosis not present

## 2012-03-27 DIAGNOSIS — I719 Aortic aneurysm of unspecified site, without rupture: Secondary | ICD-10-CM | POA: Diagnosis not present

## 2012-03-27 DIAGNOSIS — E782 Mixed hyperlipidemia: Secondary | ICD-10-CM | POA: Diagnosis not present

## 2012-03-28 DIAGNOSIS — R6889 Other general symptoms and signs: Secondary | ICD-10-CM | POA: Diagnosis not present

## 2012-04-03 ENCOUNTER — Ambulatory Visit
Admission: RE | Admit: 2012-04-03 | Discharge: 2012-04-03 | Disposition: A | Discharge status: Home / Self Care | Payer: Medicare Other | Source: Ambulatory Visit | Attending: Cardiovascular Disease | Admitting: Cardiovascular Disease

## 2012-04-03 ENCOUNTER — Other Ambulatory Visit (HOSPITAL_COMMUNITY): Payer: Self-pay | Admitting: Cardiovascular Disease

## 2012-04-03 DIAGNOSIS — I729 Aneurysm of unspecified site: Secondary | ICD-10-CM

## 2012-04-03 DIAGNOSIS — K7689 Other specified diseases of liver: Secondary | ICD-10-CM | POA: Diagnosis not present

## 2012-04-03 DIAGNOSIS — I10 Essential (primary) hypertension: Secondary | ICD-10-CM

## 2012-04-03 DIAGNOSIS — I509 Heart failure, unspecified: Secondary | ICD-10-CM

## 2012-04-03 DIAGNOSIS — I714 Abdominal aortic aneurysm, without rupture: Secondary | ICD-10-CM

## 2012-04-03 DIAGNOSIS — Z95810 Presence of automatic (implantable) cardiac defibrillator: Secondary | ICD-10-CM

## 2012-04-03 DIAGNOSIS — I519 Heart disease, unspecified: Secondary | ICD-10-CM

## 2012-04-03 DIAGNOSIS — K5730 Diverticulosis of large intestine without perforation or abscess without bleeding: Secondary | ICD-10-CM | POA: Diagnosis not present

## 2012-04-03 MED ORDER — IOHEXOL 300 MG/ML  SOLN
125.0000 mL | Freq: Once | INTRAMUSCULAR | Status: AC | PRN
  Administered 2012-04-03: 125 mL via INTRAVENOUS

## 2012-04-13 ENCOUNTER — Ambulatory Visit (HOSPITAL_COMMUNITY)
Admission: RE | Admit: 2012-04-13 | Discharge: 2012-04-13 | Disposition: A | Discharge status: Home / Self Care | Payer: Medicare Other | Source: Ambulatory Visit | Attending: Cardiovascular Disease | Admitting: Cardiovascular Disease

## 2012-04-13 DIAGNOSIS — I729 Aneurysm of unspecified site: Secondary | ICD-10-CM | POA: Diagnosis not present

## 2012-04-13 DIAGNOSIS — I714 Abdominal aortic aneurysm, without rupture: Secondary | ICD-10-CM | POA: Diagnosis not present

## 2012-04-13 NOTE — Progress Notes (Signed)
Aorta duplex completed 

## 2012-04-16 DIAGNOSIS — E291 Testicular hypofunction: Secondary | ICD-10-CM | POA: Diagnosis not present

## 2012-04-17 ENCOUNTER — Encounter (HOSPITAL_COMMUNITY): Payer: Medicare Other

## 2012-04-17 ENCOUNTER — Ambulatory Visit (HOSPITAL_COMMUNITY)
Admission: RE | Admit: 2012-04-17 | Discharge: 2012-04-17 | Disposition: A | Discharge status: Home / Self Care | Payer: Medicare Other | Source: Ambulatory Visit | Attending: Cardiovascular Disease | Admitting: Cardiovascular Disease

## 2012-04-17 DIAGNOSIS — I509 Heart failure, unspecified: Secondary | ICD-10-CM

## 2012-04-17 DIAGNOSIS — I519 Heart disease, unspecified: Secondary | ICD-10-CM

## 2012-04-17 DIAGNOSIS — Z95810 Presence of automatic (implantable) cardiac defibrillator: Secondary | ICD-10-CM

## 2012-04-17 NOTE — Progress Notes (Signed)
2D Echo Performed 04/17/2012    Daziyah Cogan, RCS  

## 2012-05-04 ENCOUNTER — Encounter (HOSPITAL_COMMUNITY): Payer: Medicare Other

## 2012-08-29 ENCOUNTER — Other Ambulatory Visit (HOSPITAL_COMMUNITY): Payer: Self-pay | Admitting: Cardiovascular Disease

## 2012-08-29 DIAGNOSIS — Z09 Encounter for follow-up examination after completed treatment for conditions other than malignant neoplasm: Secondary | ICD-10-CM

## 2012-09-13 ENCOUNTER — Ambulatory Visit (HOSPITAL_COMMUNITY)
Admission: RE | Admit: 2012-09-13 | Discharge: 2012-09-13 | Disposition: A | Discharge status: Home / Self Care | Payer: Medicare Other | Source: Ambulatory Visit | Attending: Cardiovascular Disease | Admitting: Cardiovascular Disease

## 2012-09-13 DIAGNOSIS — Z09 Encounter for follow-up examination after completed treatment for conditions other than malignant neoplasm: Secondary | ICD-10-CM

## 2012-09-13 DIAGNOSIS — I714 Abdominal aortic aneurysm, without rupture, unspecified: Secondary | ICD-10-CM | POA: Insufficient documentation

## 2012-09-13 NOTE — Progress Notes (Signed)
Aorta Duplex Completed. Emmory Solivan, RDMS, RVT  

## 2012-10-10 DIAGNOSIS — M7750 Other enthesopathy of unspecified foot: Secondary | ICD-10-CM | POA: Diagnosis not present

## 2012-10-10 DIAGNOSIS — Q748 Other specified congenital malformations of limb(s): Secondary | ICD-10-CM | POA: Diagnosis not present

## 2012-10-10 DIAGNOSIS — M79609 Pain in unspecified limb: Secondary | ICD-10-CM | POA: Diagnosis not present

## 2012-10-31 DIAGNOSIS — M79609 Pain in unspecified limb: Secondary | ICD-10-CM | POA: Diagnosis not present

## 2012-10-31 DIAGNOSIS — M7750 Other enthesopathy of unspecified foot: Secondary | ICD-10-CM | POA: Diagnosis not present

## 2012-12-24 ENCOUNTER — Other Ambulatory Visit: Payer: Self-pay | Admitting: *Deleted

## 2012-12-24 DIAGNOSIS — E119 Type 2 diabetes mellitus without complications: Secondary | ICD-10-CM | POA: Diagnosis not present

## 2012-12-24 DIAGNOSIS — I2510 Atherosclerotic heart disease of native coronary artery without angina pectoris: Secondary | ICD-10-CM | POA: Diagnosis not present

## 2012-12-24 DIAGNOSIS — I6529 Occlusion and stenosis of unspecified carotid artery: Secondary | ICD-10-CM

## 2012-12-24 DIAGNOSIS — Z4502 Encounter for adjustment and management of automatic implantable cardiac defibrillator: Secondary | ICD-10-CM | POA: Diagnosis not present

## 2012-12-24 DIAGNOSIS — I509 Heart failure, unspecified: Secondary | ICD-10-CM | POA: Diagnosis not present

## 2012-12-24 DIAGNOSIS — I672 Cerebral atherosclerosis: Secondary | ICD-10-CM

## 2012-12-24 LAB — PACEMAKER DEVICE OBSERVATION

## 2013-01-07 ENCOUNTER — Ambulatory Visit (HOSPITAL_COMMUNITY)
Admission: RE | Admit: 2013-01-07 | Discharge: 2013-01-07 | Disposition: A | Discharge status: Home / Self Care | Payer: Medicare Other | Source: Ambulatory Visit | Attending: Cardiovascular Disease | Admitting: Cardiovascular Disease

## 2013-01-07 DIAGNOSIS — R0989 Other specified symptoms and signs involving the circulatory and respiratory systems: Secondary | ICD-10-CM | POA: Diagnosis not present

## 2013-01-07 DIAGNOSIS — I672 Cerebral atherosclerosis: Secondary | ICD-10-CM | POA: Diagnosis not present

## 2013-01-07 DIAGNOSIS — I6529 Occlusion and stenosis of unspecified carotid artery: Secondary | ICD-10-CM | POA: Diagnosis not present

## 2013-01-07 NOTE — Progress Notes (Signed)
Carotid Duplex Completed. Brittanni Cariker, BS, RDMS, RVT  

## 2013-01-10 ENCOUNTER — Encounter: Payer: Self-pay | Admitting: Cardiovascular Disease

## 2013-02-22 ENCOUNTER — Telehealth (HOSPITAL_COMMUNITY): Payer: Self-pay | Admitting: *Deleted

## 2013-03-06 DIAGNOSIS — Z23 Encounter for immunization: Secondary | ICD-10-CM | POA: Diagnosis not present

## 2013-03-06 DIAGNOSIS — E1129 Type 2 diabetes mellitus with other diabetic kidney complication: Secondary | ICD-10-CM | POA: Diagnosis not present

## 2013-03-06 DIAGNOSIS — N182 Chronic kidney disease, stage 2 (mild): Secondary | ICD-10-CM | POA: Diagnosis not present

## 2013-03-06 DIAGNOSIS — Z6828 Body mass index (BMI) 28.0-28.9, adult: Secondary | ICD-10-CM | POA: Diagnosis not present

## 2013-03-06 DIAGNOSIS — Z79899 Other long term (current) drug therapy: Secondary | ICD-10-CM | POA: Diagnosis not present

## 2013-03-06 DIAGNOSIS — I2510 Atherosclerotic heart disease of native coronary artery without angina pectoris: Secondary | ICD-10-CM | POA: Diagnosis not present

## 2013-03-12 ENCOUNTER — Ambulatory Visit (HOSPITAL_COMMUNITY)
Admission: RE | Admit: 2013-03-12 | Discharge: 2013-03-12 | Disposition: A | Discharge status: Home / Self Care | Payer: Medicare Other | Source: Ambulatory Visit | Attending: Cardiovascular Disease | Admitting: Cardiovascular Disease

## 2013-03-12 ENCOUNTER — Other Ambulatory Visit (HOSPITAL_COMMUNITY): Payer: Self-pay | Admitting: Cardiovascular Disease

## 2013-03-12 DIAGNOSIS — I7409 Other arterial embolism and thrombosis of abdominal aorta: Secondary | ICD-10-CM | POA: Diagnosis not present

## 2013-03-12 DIAGNOSIS — I10 Essential (primary) hypertension: Secondary | ICD-10-CM

## 2013-03-12 DIAGNOSIS — I714 Abdominal aortic aneurysm, without rupture, unspecified: Secondary | ICD-10-CM | POA: Insufficient documentation

## 2013-03-12 NOTE — Progress Notes (Signed)
Aorta Duplex Completed. Desmen Schoffstall, BS, RDMS, RVT  

## 2013-03-20 ENCOUNTER — Telehealth: Payer: Self-pay | Admitting: Cardiovascular Disease

## 2013-03-20 NOTE — Telephone Encounter (Signed)
Returning your call concerning test results

## 2013-03-21 NOTE — Telephone Encounter (Signed)
Spoke with patient and gave doppler results.  appt made to see Dr Allyson Sabal

## 2013-03-29 ENCOUNTER — Ambulatory Visit (INDEPENDENT_AMBULATORY_CARE_PROVIDER_SITE_OTHER): Payer: Medicare Other | Admitting: Cardiovascular Disease

## 2013-03-29 ENCOUNTER — Encounter: Payer: Self-pay | Admitting: Cardiovascular Disease

## 2013-03-29 VITALS — BP 122/68 | HR 68 | Ht 72.0 in | Wt 208.0 lb

## 2013-03-29 DIAGNOSIS — I714 Abdominal aortic aneurysm, without rupture, unspecified: Secondary | ICD-10-CM

## 2013-03-29 DIAGNOSIS — I2510 Atherosclerotic heart disease of native coronary artery without angina pectoris: Secondary | ICD-10-CM

## 2013-03-29 DIAGNOSIS — I2589 Other forms of chronic ischemic heart disease: Secondary | ICD-10-CM | POA: Diagnosis not present

## 2013-03-29 DIAGNOSIS — I255 Ischemic cardiomyopathy: Secondary | ICD-10-CM

## 2013-03-29 DIAGNOSIS — Z95810 Presence of automatic (implantable) cardiac defibrillator: Secondary | ICD-10-CM | POA: Diagnosis not present

## 2013-03-29 DIAGNOSIS — I10 Essential (primary) hypertension: Secondary | ICD-10-CM

## 2013-03-29 DIAGNOSIS — E785 Hyperlipidemia, unspecified: Secondary | ICD-10-CM

## 2013-03-29 DIAGNOSIS — I701 Atherosclerosis of renal artery: Secondary | ICD-10-CM | POA: Insufficient documentation

## 2013-03-29 DIAGNOSIS — E1159 Type 2 diabetes mellitus with other circulatory complications: Secondary | ICD-10-CM | POA: Insufficient documentation

## 2013-03-29 DIAGNOSIS — E119 Type 2 diabetes mellitus without complications: Secondary | ICD-10-CM

## 2013-03-29 NOTE — Assessment & Plan Note (Signed)
On statin therapy followed by his PCP 

## 2013-03-29 NOTE — Assessment & Plan Note (Signed)
Controlled on current medications 

## 2013-03-29 NOTE — Assessment & Plan Note (Signed)
Status post anterior wall myocardial infarction in 2001 with a total LAD, high-grade circumflex as well as bilateral renal artery stenosis. EF was 35%. A viability study showed viability and he subsequently underwent coronary artery bypass grafting x4 by Dr. Cornelius Moras September 2001 with a LIMA to his LAD and a vein graft to an obtuse diagonal branch, vein graft to circumflex and to the PDA. Echo performed 04/17/12 revealed an EF of 30-35%. He does complain of dyspnea or shortness of breath but denies chest pain. He had a Myoview stress test performed/20/12 showed scar in the LAD territory.

## 2013-03-29 NOTE — Patient Instructions (Signed)
  Your physician wants you to follow-up with him in : 1 year                                             and with an extender in : 6 months                     You will receive a reminder letter in the mail one month in advance. If you don't receive a letter, please call our office to schedule the follow-up appointment.    Your physician has ordered the following tests: abdominal dopplers in 6 months and a lexiscan myoview in 1 year

## 2013-03-29 NOTE — Assessment & Plan Note (Signed)
Found at the time of angiography in 2001 prior to his bypass surgery. His most recent renal Dopplers performed 03/22/11 did not show this to be

## 2013-03-29 NOTE — Progress Notes (Signed)
03/29/2013 Jordan Ross   07/15/1945  [id number]  Primary Physician Cassell Ross., Ross Primary Cardiologist: Jordan Ross Jordan Ross   HPI:  Jordan Ross is a 67 year old mildly overweight widowed Caucasian male father of one child who is retired from working at [employer] and [employer]. He is formally a patient of Jordan Ross eyedrops. He is a long complicated past medical history notable for coronary artery disease status post anterior wall hypofunction in 2001 treated with coronary artery bypass grafting by Jordan Ross  September 2001. His LIMA to his LAD, vein graft to a ramus branch, circumflex and PDA. His EF was in the 35% range.essentially underwent ICD implantation for primary prevention October 2000 which was followed by Jordan Ross. His other problems include a known abdominal aortic aneurysm measuring 5 cm following the ultrasound semiannually. He has a history of hypertension, hyperlipidemia and non-insulin-requiring diabetes. He has chronic shortness of breath is likely related to a history of tobacco abuse having smoked 40-pack-years and quit back in 2001 at the time of his open heart surgery but denies chest pain. The stress Myoview performed 04/07/11 showed anterior scar without ischemia.   Current Outpatient Prescriptions  Medication Sig Dispense Refill  . aspirin 81 MG tablet Take 162 mg by mouth daily.      [name] atorvastatin (LIPITOR) 40 MG tablet Take 40 mg by mouth daily.      . carvedilol (COREG) 12.5 MG tablet Take 12.5 mg by mouth 2 (two) times daily.      . digoxin (LANOXIN) 0.125 MG tablet Take 125 mcg by mouth daily.      [name] glipiZIDE (GLUCOTROL XL) 10 MG 24 hr tablet Take 10 mg by mouth 2 (two) times daily.      . metFORMIN (GLUCOPHAGE) 500 MG tablet Take 500 mg by mouth. 2 tablets twice a day      . niacin (NIASPAN) 500 MG CR tablet Take 500 mg by mouth daily.      . pantoprazole (PROTONIX) 40 MG tablet Take 40 mg by mouth daily.       [name] spironolactone (ALDACTONE) 25 MG tablet Take 25 mg by mouth daily.      . valsartan (DIOVAN) 160 MG tablet Take 160 mg by mouth daily.       No current facility-administered medications for this visit.    No Known Allergies  History   Social History  . Marital Status: Widowed    Spouse Name: N/A    Number of Children: N/A  . Years of Education: N/A   Occupational History  . Not on file.   Social History Main Topics  . Smoking status: Former Games developer  . Smokeless tobacco: Not on file  . Alcohol Use: Not on file  . Drug Use: Not on file  . Sexual Activity: Not on file   Other Topics Concern  . Not on file   Social History Narrative  . No narrative on file     Review of Systems: General: negative for chills, fever, night sweats or weight changes.  Cardiovascular: negative for chest pain, dyspnea on exertion, edema, orthopnea, palpitations, paroxysmal nocturnal dyspnea or shortness of breath Dermatological: negative for rash Respiratory: negative for cough or wheezing Urologic: negative for hematuria Abdominal: negative for nausea, vomiting, diarrhea, bright red blood per rectum, melena, or hematemesis Neurologic: negative for visual changes, syncope, or dizziness All other systems reviewed and are otherwise negative except as noted above.    Blood pressure  122/68, pulse 68, height 6' (1.829 m), weight 208 lb (94.348 kg).  General appearance: alert and no distress Neck: no adenopathy, no carotid bruit, no JVD, supple, symmetrical, trachea midline and thyroid not enlarged, symmetric, no tenderness/mass/nodules Lungs: clear to auscultation bilaterally Heart: regular rate and rhythm, S1, S2 normal, no murmur, click, rub or gallop Abdomen: soft, non-tender; bowel sounds normal; no masses,  no organomegaly Extremities: extremities normal, atraumatic, no cyanosis or edema Pulses: 2+ and symmetric  EKG not performed today  ASSESSMENT AND PLAN:   Coronary artery  disease Status post anterior wall myocardial infarction in 2001 with a total LAD, high-grade circumflex as well as bilateral renal artery stenosis. EF was 35%. A viability study showed viability and he subsequently underwent coronary artery bypass grafting x4 by Jordan Ross September 2001 with a LIMA to his LAD and a vein graft to an obtuse diagonal branch, vein graft to circumflex and to the PDA. Echo performed 04/17/12 revealed an EF of 30-35%. He does complain of dyspnea or shortness of breath but denies chest pain. He had a Myoview stress test performed/20/12 showed scar in the LAD territory.  ICD (implantable cardioverter-defibrillator), single, in situ Placed for primary prevention October 2010. It was placed for primary prevention  Followup is scheduled with Dr. Graciela Husbands  Essential hypertension Controlled on current medications  Hyperlipidemia On statin therapy followed by his PCP  Abdominal aortic aneurysm Cognitive ultrasound recently measuring 5 cm. We'll currently we'll continue to follow this semiannually  Bilateral renal artery stenosis Found at the time of angiography in 2001 prior to his bypass surgery. His most recent renal Dopplers performed 03/22/11 did not show this to be      Jordan Ross [place], [facility] 03/29/2013 12:43 PM

## 2013-03-29 NOTE — Assessment & Plan Note (Signed)
Cognitive ultrasound recently measuring 5 cm. We'll currently we'll continue to follow this semiannually

## 2013-03-29 NOTE — Assessment & Plan Note (Signed)
Placed for primary prevention October 2010. It was placed for primary prevention  Followup is scheduled with Dr. Graciela Husbands

## 2013-04-29 ENCOUNTER — Encounter: Payer: Self-pay | Admitting: Cardiovascular Disease

## 2013-04-29 ENCOUNTER — Ambulatory Visit (INDEPENDENT_AMBULATORY_CARE_PROVIDER_SITE_OTHER): Payer: Medicare Other | Admitting: Cardiovascular Disease

## 2013-04-29 VITALS — BP 116/68 | HR 59 | Ht 72.0 in | Wt 206.0 lb

## 2013-04-29 DIAGNOSIS — I714 Abdominal aortic aneurysm, without rupture, unspecified: Secondary | ICD-10-CM

## 2013-04-29 DIAGNOSIS — I509 Heart failure, unspecified: Secondary | ICD-10-CM | POA: Diagnosis not present

## 2013-04-29 DIAGNOSIS — Z95810 Presence of automatic (implantable) cardiac defibrillator: Secondary | ICD-10-CM | POA: Diagnosis not present

## 2013-04-29 DIAGNOSIS — I2510 Atherosclerotic heart disease of native coronary artery without angina pectoris: Secondary | ICD-10-CM

## 2013-04-29 DIAGNOSIS — I255 Ischemic cardiomyopathy: Secondary | ICD-10-CM

## 2013-04-29 DIAGNOSIS — I2589 Other forms of chronic ischemic heart disease: Secondary | ICD-10-CM | POA: Diagnosis not present

## 2013-04-29 LAB — MDC_IDC_ENUM_SESS_TYPE_INCLINIC
Brady Statistic RV Percent Paced: 0.11 %
HIGH POWER IMPEDANCE MEASURED VALUE: 47.2808
Implantable Pulse Generator Serial Number: 737526
Lead Channel Impedance Value: 462.5 Ohm
Lead Channel Pacing Threshold Amplitude: 0.5 V
Lead Channel Pacing Threshold Amplitude: 0.5 V
Lead Channel Pacing Threshold Pulse Width: 0.4 ms
Lead Channel Pacing Threshold Pulse Width: 0.4 ms
Lead Channel Sensing Intrinsic Amplitude: 11.9 mV
Lead Channel Setting Sensing Sensitivity: 0.5 mV
MDC IDC SESS DTM: 20150112170505
MDC IDC SET LEADCHNL RV PACING AMPLITUDE: 2.5 V
MDC IDC SET LEADCHNL RV PACING PULSEWIDTH: 0.4 ms
MDC IDC SET ZONE DETECTION INTERVAL: 310 ms

## 2013-04-29 LAB — ICD DEVICE OBSERVATION

## 2013-04-29 NOTE — Assessment & Plan Note (Signed)
Despite significant cardiomyopathy, he does not have overt findings to suggest congestive heart failure. NYHA functional class II (shortness of breath on exertion) status could easily be due to emphysema as well as CHF.

## 2013-04-29 NOTE — Assessment & Plan Note (Signed)
Asymptomatic and with a fairly recent low risk nuclear scintigram, large scar in the LAD artery distribution.

## 2013-04-29 NOTE — Assessment & Plan Note (Signed)
Normal function of his defibrillator. He does not require ventricular pacing. No episodes of tachyarrhythmia have been recorded. The lead parameters are excellent. Impedance 460: (High-voltage 47 ohm), threshold 0.5 V at 0.4 ms, R waves 11.9 mV. Thoracic impedance recordings showed some decline in early December but have subsequently normalized. He denies have any problems or shortness of breath or edema at that time. No permanent changes are made the device settings today.

## 2013-04-29 NOTE — Progress Notes (Signed)
Patient ID: Jordan Ross, male   DOB: May 23, 1945, 68 y.o.   MRN: [id number]     Reason for office visit Implantable defibrillator followup  Jordan Ross is a 68 year old mildly overweight man with ischemic cardiomyopathy, previously a patient of Dr. Terance Ice. Was entered with a anterior wall myocardial infarction in 2001 treated with coronary artery bypass grafting by Dr. Roxy Manns (His LIMA to his LAD, vein graft to a ramus branch, circumflex and PDA). His EF was in the 35% range. He underwent ICD implantation (single-chamber St. Jude Fortify) for primary prevention October 2010. He has not received either appropriate or inappropriate shocks.  His other problems include a known abdominal aortic aneurysm measuring 5 cm following the ultrasound semiannually. He has a history of hypertension, hyperlipidemia and non-insulin-requiring diabetes. He has chronic shortness of breath is likely related to a history of tobacco abuse having smoked 40-pack-years and quit back in 2001 at the time of his open heart surgery. He denies chest pain. The stress Myoview performed 04/07/11 showed anterior scar without ischemia.   No Known Allergies  Current Outpatient Prescriptions  Medication Sig Dispense Refill  . aspirin 81 MG tablet Take 162 mg by mouth daily.      [name] atorvastatin (LIPITOR) 40 MG tablet Take 40 mg by mouth daily.      . carvedilol (COREG) 12.5 MG tablet Take 12.5 mg by mouth 2 (two) times daily.      . digoxin (LANOXIN) 0.125 MG tablet Take 125 mcg by mouth daily.      [name] glipiZIDE (GLUCOTROL XL) 10 MG 24 hr tablet Take 10 mg by mouth 2 (two) times daily.      . metFORMIN (GLUCOPHAGE) 500 MG tablet Take 500 mg by mouth. 2 tablets twice a day      . niacin (NIASPAN) 500 MG CR tablet Take 500 mg by mouth daily.      . pantoprazole (PROTONIX) 40 MG tablet Take 40 mg by mouth daily.      [name] spironolactone (ALDACTONE) 25 MG tablet Take 25 mg by mouth daily.      . valsartan (DIOVAN) 160 MG tablet  Take 160 mg by mouth daily.       No current facility-administered medications for this visit.    Past Medical History  Diagnosis Date  . Status post coronary artery bypass grafting   . Ischemic cardiomyopathy   . Abdominal aortic aneurysm   . Hypertension   . Hyperlipidemia   . Type 2 diabetes mellitus   . ICD (implantable cardioverter-defibrillator), single, in situ     No past surgical history on file.  No family history on file.  History   Social History  . Marital Status: Widowed    Spouse Name: N/A    Number of Children: N/A  . Years of Education: N/A   Occupational History  . Not on file.   Social History Main Topics  . Smoking status: Former Research scientist (life sciences)  . Smokeless tobacco: Not on file  . Alcohol Use: Not on file  . Drug Use: Not on file  . Sexual Activity: Not on file   Other Topics Concern  . Not on file   Social History Narrative  . No narrative on file    Review of systems: The patient specifically denies any chest pain at rest or with exertion, dyspnea at rest or with exertion, orthopnea, paroxysmal nocturnal dyspnea, syncope, palpitations, focal neurological deficits, intermittent claudication, lower extremity edema, unexplained weight gain, cough, hemoptysis or wheezing.  The patient also denies abdominal pain, nausea, vomiting, dysphagia, diarrhea, constipation, polyuria, polydipsia, dysuria, hematuria, frequency, urgency, abnormal bleeding or bruising, fever, chills, unexpected weight changes, mood swings, change in skin or hair texture, change in voice quality, auditory or visual problems, allergic reactions or rashes, new musculoskeletal complaints other than usual "aches and pains".   PHYSICAL EXAM BP 116/68  Pulse 59  Ht 6' (1.829 m)  Wt 206 lb (93.441 kg)  BMI 27.93 kg/m2  General: Alert, oriented x3, no distress Head: no evidence of trauma, PERRL, EOMI, no exophtalmos or lid lag, no myxedema, no xanthelasma; normal ears, nose and  oropharynx Neck: normal jugular venous pulsations and no hepatojugular reflux; brisk carotid pulses without delay and no carotid bruits Chest: clear to auscultation, no signs of consolidation by percussion or palpation, normal fremitus, symmetrical and full respiratory excursions, healthy subclavian defibrillator site Cardiovascular: normal position and quality of the apical impulse, regular rhythm, normal first and second heart sounds, no murmurs, rubs or gallops Abdomen: no tenderness or distention, no masses by palpation, no abnormal pulsatility or arterial bruits, normal bowel sounds, no hepatosplenomegaly Extremities: no clubbing, cyanosis or edema; 2+ radial, ulnar and brachial pulses bilaterally; 2+ right femoral, posterior tibial and dorsalis pedis pulses; 2+ left femoral, posterior tibial and dorsalis pedis pulses; no subclavian or femoral bruits Neurological: grossly nonfocal   EKG: Sinus rhythm with PVCs, Maj. intraventricular conduction delay (QRS 126 ms), most closely resembling a left bundle branch block with persistent ST segment elevation and Q waves in leads V1 through V4 and T wave inversion all the way through V6, consistent with a "frozen infarct" pattern. Left axis deviation.   ASSESSMENT AND PLAN ICD (implantable cardioverter-defibrillator), single, in situ Normal function of his defibrillator. He does not require ventricular pacing. No episodes of tachyarrhythmia have been recorded. The lead parameters are excellent. Impedance 460: (High-voltage 47 ohm), threshold 0.5 V at 0.4 ms, R waves 11.9 mV. Thoracic impedance recordings showed some decline in early December but have subsequently normalized. He denies have any problems or shortness of breath or edema at that time. No permanent changes are made the device settings today.  Coronary artery disease Asymptomatic and with a fairly recent low risk nuclear scintigram, large scar in the LAD artery distribution.  Cardiomyopathy,  ischemic Despite significant cardiomyopathy, he does not have overt findings to suggest congestive heart failure. NYHA functional class II (shortness of breath on exertion) status could easily be due to emphysema as well as CHF.  Abdominal aortic aneurysm Hopefully he will be a candidate for aneurysm stent graft repair and avoid open aortic surgery.   Orders Placed This Encounter  Procedures  . EKG 12-Lead   No orders of the defined types were placed in this encounter.    Holli Humbles, MD, [place] [phone] office [phone] pager

## 2013-04-29 NOTE — Assessment & Plan Note (Signed)
Hopefully he will be a candidate for aneurysm stent graft repair and avoid open aortic surgery.

## 2013-04-29 NOTE — Patient Instructions (Addendum)
Remote monitoring is used to monitor your ICD from home. This monitoring reduces the number of office visits required to check your device to one time per year. It allows [place] to keep an eye on the functioning of your device to ensure it is working properly. You are scheduled for a device check from home on 07-31-2013. You may send your transmission at any time that day. If you have a wireless device, the transmission will be sent automatically. After your physician reviews your transmission, you will receive a postcard with your next transmission date.  Your physician recommends that you schedule a follow-up appointment in: 12 months with Dr.Croitoru

## 2013-04-30 ENCOUNTER — Encounter: Payer: Self-pay | Admitting: Cardiovascular Disease

## 2013-07-31 ENCOUNTER — Ambulatory Visit (INDEPENDENT_AMBULATORY_CARE_PROVIDER_SITE_OTHER): Payer: Medicare Other | Admitting: *Deleted

## 2013-07-31 DIAGNOSIS — I2589 Other forms of chronic ischemic heart disease: Secondary | ICD-10-CM | POA: Diagnosis not present

## 2013-07-31 DIAGNOSIS — I509 Heart failure, unspecified: Secondary | ICD-10-CM | POA: Diagnosis not present

## 2013-07-31 DIAGNOSIS — I255 Ischemic cardiomyopathy: Secondary | ICD-10-CM

## 2013-07-31 LAB — MDC_IDC_ENUM_SESS_TYPE_REMOTE
Battery Remaining Longevity: 78 mo
Battery Remaining Percentage: 66 %
Battery Voltage: 2.96 V
Date Time Interrogation Session: 20150415094716
HighPow Impedance: 45 Ohm
Implantable Pulse Generator Serial Number: 737526
Lead Channel Impedance Value: 450 Ohm
Lead Channel Sensing Intrinsic Amplitude: 11.9 mV
Lead Channel Setting Pacing Pulse Width: 0.4 ms
Lead Channel Setting Sensing Sensitivity: 0.5 mV
MDC IDC MSMT LEADCHNL RV PACING THRESHOLD AMPLITUDE: 0.5 V
MDC IDC MSMT LEADCHNL RV PACING THRESHOLD PULSEWIDTH: 0.4 ms
MDC IDC SET LEADCHNL RV PACING AMPLITUDE: 2.5 V
MDC IDC STAT BRADY RV PERCENT PACED: 1 %
Zone Setting Detection Interval: 310 ms

## 2013-08-09 ENCOUNTER — Encounter: Payer: Self-pay | Admitting: *Deleted

## 2013-08-14 ENCOUNTER — Encounter: Payer: Self-pay | Admitting: Cardiovascular Disease

## 2013-09-04 ENCOUNTER — Telehealth: Payer: Self-pay | Admitting: Cardiovascular Disease

## 2013-09-05 NOTE — Telephone Encounter (Signed)
Closed encounter °

## 2013-10-01 ENCOUNTER — Telehealth (HOSPITAL_COMMUNITY): Payer: Self-pay | Admitting: *Deleted

## 2013-10-08 ENCOUNTER — Ambulatory Visit (HOSPITAL_COMMUNITY)
Admission: RE | Admit: 2013-10-08 | Discharge: 2013-10-08 | Disposition: A | Discharge status: Home / Self Care | Payer: Medicare Other | Source: Ambulatory Visit | Attending: Cardiovascular Disease | Admitting: Cardiovascular Disease

## 2013-10-08 DIAGNOSIS — I714 Abdominal aortic aneurysm, without rupture, unspecified: Secondary | ICD-10-CM | POA: Diagnosis not present

## 2013-10-08 NOTE — Progress Notes (Signed)
Aorta Duplex Completed. Messi Twedt, BS, RDMS, RVT  

## 2013-10-15 ENCOUNTER — Encounter: Payer: Self-pay | Admitting: *Deleted

## 2013-11-12 ENCOUNTER — Encounter: Payer: Self-pay | Admitting: Cardiovascular Disease

## 2013-11-12 ENCOUNTER — Ambulatory Visit (INDEPENDENT_AMBULATORY_CARE_PROVIDER_SITE_OTHER): Payer: Medicare Other | Admitting: Cardiovascular Disease

## 2013-11-12 VITALS — BP 140/82 | HR 72 | Resp 20 | Ht 72.0 in | Wt 207.2 lb

## 2013-11-12 DIAGNOSIS — Z95810 Presence of automatic (implantable) cardiac defibrillator: Secondary | ICD-10-CM | POA: Diagnosis not present

## 2013-11-12 DIAGNOSIS — I2510 Atherosclerotic heart disease of native coronary artery without angina pectoris: Secondary | ICD-10-CM

## 2013-11-12 DIAGNOSIS — I2589 Other forms of chronic ischemic heart disease: Secondary | ICD-10-CM

## 2013-11-12 DIAGNOSIS — I509 Heart failure, unspecified: Secondary | ICD-10-CM

## 2013-11-12 DIAGNOSIS — I255 Ischemic cardiomyopathy: Secondary | ICD-10-CM

## 2013-11-12 LAB — MDC_IDC_ENUM_SESS_TYPE_INCLINIC
HighPow Impedance: 45.7198
Implantable Pulse Generator Serial Number: 737526
Lead Channel Pacing Threshold Amplitude: 0.5 V
Lead Channel Pacing Threshold Amplitude: 0.5 V
Lead Channel Pacing Threshold Pulse Width: 0.4 ms
Lead Channel Pacing Threshold Pulse Width: 0.4 ms
Lead Channel Sensing Intrinsic Amplitude: 11.9 mV
Lead Channel Setting Pacing Amplitude: 2.5 V
Lead Channel Setting Pacing Pulse Width: 0.4 ms
Lead Channel Setting Sensing Sensitivity: 0.5 mV
MDC IDC MSMT BATTERY REMAINING LONGEVITY: 75.6 mo
MDC IDC MSMT LEADCHNL RV IMPEDANCE VALUE: 462.5 Ohm
MDC IDC SESS DTM: 20150728135959
MDC IDC SET ZONE DETECTION INTERVAL: 310 ms
MDC IDC STAT BRADY RV PERCENT PACED: 0.15 %

## 2013-11-12 NOTE — Patient Instructions (Signed)
Remote monitoring is used to monitor your  ICD from home. This monitoring reduces the number of office visits required to check your device to one time per year. It allows [place] to keep an eye on the functioning of your device to ensure it is working properly. You are scheduled for a device check from home on 02-12-2014. You may send your transmission at any time that day. If you have a wireless device, the transmission will be sent automatically. After your physician reviews your transmission, you will receive a postcard with your next transmission date.  Your physician recommends that you schedule a follow-up appointment in: 12 months with Dr.Croitoru

## 2013-11-14 ENCOUNTER — Encounter: Payer: Self-pay | Admitting: Cardiovascular Disease

## 2013-11-14 NOTE — Progress Notes (Signed)
Patient ID: Jordan Ross, male   DOB: [birth year], 68 y.o.   MRN: [id number]      Reason for office visit ICD followup  Jordan Ross is a 68 year old mildly overweight man with ischemic cardiomyopathy, followed by Dr. Gwenlyn Found. He presented with an anterior wall myocardial infarction in 2001 treated with coronary artery bypass grafting by Dr. Roxy Manns (His LIMA to his LAD, vein graft to a ramus branch, circumflex and PDA). His EF was in the 35% range. He underwent ICD implantation (single-chamber St. Jude Fortify) for primary prevention October 2010. He has not received either appropriate or inappropriate shocks. His other problems include a known abdominal aortic aneurysm measuring 5 cm followed by ultrasound semiannually. He has a history of hypertension, hyperlipidemia and non-insulin-requiring diabetes. He has chronic shortness of breath, likely related to a history of tobacco abuse, having smoked 40-pack-years, quit 2001 at the time of his open heart surgery. He denies chest pain. The stress Myoview performed 04/07/11 showed anterior scar without ischemia.  Here today for defibrillator check, he has not had any new cardiovascular events. His abdominal aortic aneurysm still measures around 5 cm in diameter. His electrocardiogram shows a "frozen" anterior myocardial infarction. His dyspnea remains at baseline. In the month of July he had a temporary drop in his thoracic impedance measured by [facility], this resolved spontaneously in about 10 days. He does recall having some worsening dyspnea around that time .The interrogation otherwise shows normal function. There is virtually no ventricular pacing. He has not had any high ventricular rates.   No Known Allergies  Current Outpatient Prescriptions  Medication Sig Dispense Refill  . aspirin 81 MG tablet Take 162 mg by mouth daily.      [name] atorvastatin (LIPITOR) 40 MG tablet Take 40 mg by mouth daily.      . carvedilol (COREG) 12.5 MG tablet Take 12.5 mg by  mouth 2 (two) times daily.      . digoxin (LANOXIN) 0.125 MG tablet Take 125 mcg by mouth daily.      [name] glipiZIDE (GLUCOTROL XL) 10 MG 24 hr tablet Take 20 mg by mouth daily with breakfast.       . metFORMIN (GLUCOPHAGE) 500 MG tablet Take 1,000 mg by mouth 2 (two) times daily with a meal.       . niacin (NIASPAN) 500 MG CR tablet Take 500 mg by mouth daily.      . pantoprazole (PROTONIX) 40 MG tablet Take 40 mg by mouth daily.      [name] spironolactone (ALDACTONE) 25 MG tablet Take 25 mg by mouth daily.      . valsartan (DIOVAN) 160 MG tablet Take 160 mg by mouth daily.       No current facility-administered medications for this visit.    Past Medical History  Diagnosis Date  . Status post coronary artery bypass grafting   . Ischemic cardiomyopathy   . Abdominal aortic aneurysm   . Hypertension   . Hyperlipidemia   . Type 2 diabetes mellitus   . ICD (implantable cardioverter-defibrillator), single, in situ     No past surgical history on file.  No family history on file.  History   Social History  . Marital Status: Widowed    Spouse Name: N/A    Number of Children: N/A  . Years of Education: N/A   Occupational History  . Not on file.   Social History Main Topics  . Smoking status: Former Smoker -- 28 years    Quit  date: 11/13/1998  . Smokeless tobacco: Never Used  . Alcohol Use: Yes     Comment: occasional   . Drug Use: No  . Sexual Activity: Not on file   Other Topics Concern  . Not on file   Social History Narrative  . No narrative on file    Review of systems: The patient specifically denies any chest pain at rest or with exertion, dyspnea at rest, orthopnea, paroxysmal nocturnal dyspnea, syncope, palpitations, focal neurological deficits, intermittent claudication, lower extremity edema, unexplained weight gain, cough, hemoptysis or wheezing.  The patient also denies abdominal pain, nausea, vomiting, dysphagia, diarrhea, constipation, polyuria, polydipsia,  dysuria, hematuria, frequency, urgency, abnormal bleeding or bruising, fever, chills, unexpected weight changes, mood swings, change in skin or hair texture, change in voice quality, auditory or visual problems, allergic reactions or rashes, new musculoskeletal complaints other than usual "aches and pains".   PHYSICAL EXAM BP 140/82  Pulse 72  Resp 20  Ht 6' (1.829 m)  Wt 207 lb 3.2 oz (93.985 kg)  BMI 28.10 kg/m2  General: Alert, oriented x3, no distress Head: no evidence of trauma, PERRL, EOMI, no exophtalmos or lid lag, no myxedema, no xanthelasma; normal ears, nose and oropharynx Neck: normal jugular venous pulsations and no hepatojugular reflux; brisk carotid pulses without delay and no carotid bruits Chest: clear to auscultation, no signs of consolidation by percussion or palpation, normal fremitus, symmetrical and full respiratory excursions, healthy subclavian ICD site Cardiovascular: normal position and quality of the apical impulse, regular rhythm, normal first and second heart sounds, no murmurs, rubs or gallops Abdomen: no tenderness or distention, no masses by palpation, no abnormal pulsatility or arterial bruits, normal bowel sounds, no hepatosplenomegaly Extremities: no clubbing, cyanosis or edema; 2+ radial, ulnar and brachial pulses bilaterally; 2+ right femoral, posterior tibial and dorsalis pedis pulses; 2+ left femoral, posterior tibial and dorsalis pedis pulses; no subclavian or femoral bruits Neurological: grossly nonfocal   EKG: Normal sinus rhythm, left axis deviation, frozen anterior MI, left ventricular hypertrophy   ASSESSMENT AND PLAN   normal ICD function. Brief episode of hypervolemia was minimally symptomatic and has resolved spontaneously. No adjustments were made to device settings are his medications. Followup with Dr. Gwenlyn Found for congestive heart failure and CAD. Remote Carelink Q3 months and office ICD check in 12 months.  Orders Placed This Encounter    Procedures  . Implantable device check   No orders of the defined types were placed in this encounter.    Holli Humbles, MD, [place] [phone] office [phone] pager

## 2013-12-30 ENCOUNTER — Encounter: Payer: Self-pay | Admitting: Cardiovascular Disease

## 2014-01-22 DIAGNOSIS — E663 Overweight: Secondary | ICD-10-CM | POA: Diagnosis not present

## 2014-01-22 DIAGNOSIS — Z6828 Body mass index (BMI) 28.0-28.9, adult: Secondary | ICD-10-CM | POA: Diagnosis not present

## 2014-01-22 DIAGNOSIS — Z125 Encounter for screening for malignant neoplasm of prostate: Secondary | ICD-10-CM | POA: Diagnosis not present

## 2014-01-22 DIAGNOSIS — D649 Anemia, unspecified: Secondary | ICD-10-CM | POA: Diagnosis not present

## 2014-01-22 DIAGNOSIS — Z23 Encounter for immunization: Secondary | ICD-10-CM | POA: Diagnosis not present

## 2014-01-22 DIAGNOSIS — E1129 Type 2 diabetes mellitus with other diabetic kidney complication: Secondary | ICD-10-CM | POA: Diagnosis not present

## 2014-01-22 DIAGNOSIS — I10 Essential (primary) hypertension: Secondary | ICD-10-CM | POA: Diagnosis not present

## 2014-01-22 DIAGNOSIS — E119 Type 2 diabetes mellitus without complications: Secondary | ICD-10-CM | POA: Diagnosis not present

## 2014-02-05 ENCOUNTER — Encounter (INDEPENDENT_AMBULATORY_CARE_PROVIDER_SITE_OTHER): Payer: Self-pay | Admitting: *Deleted

## 2014-02-13 ENCOUNTER — Ambulatory Visit (INDEPENDENT_AMBULATORY_CARE_PROVIDER_SITE_OTHER): Payer: Medicare Other | Admitting: *Deleted

## 2014-02-13 ENCOUNTER — Encounter: Payer: Self-pay | Admitting: Cardiovascular Disease

## 2014-02-13 DIAGNOSIS — I255 Ischemic cardiomyopathy: Secondary | ICD-10-CM | POA: Diagnosis not present

## 2014-02-13 LAB — MDC_IDC_ENUM_SESS_TYPE_REMOTE
Battery Remaining Percentage: 62 %
HighPow Impedance: 48 Ohm
Implantable Pulse Generator Serial Number: 737526
Lead Channel Impedance Value: 490 Ohm
Lead Channel Pacing Threshold Amplitude: 0.5 V
Lead Channel Pacing Threshold Pulse Width: 0.4 ms
Lead Channel Sensing Intrinsic Amplitude: 12 mV
Lead Channel Setting Pacing Pulse Width: 0.4 ms
Lead Channel Setting Sensing Sensitivity: 0.5 mV
MDC IDC MSMT BATTERY REMAINING LONGEVITY: 73 mo
MDC IDC MSMT BATTERY VOLTAGE: 2.96 V
MDC IDC SESS DTM: 20151029065508
MDC IDC SET LEADCHNL RV PACING AMPLITUDE: 2.5 V
MDC IDC SET ZONE DETECTION INTERVAL: 310 ms
MDC IDC STAT BRADY RV PERCENT PACED: 1 %

## 2014-02-13 NOTE — Progress Notes (Signed)
Remote ICD transmission.   

## 2014-02-19 ENCOUNTER — Encounter: Payer: Self-pay | Admitting: Cardiology

## 2014-03-05 ENCOUNTER — Ambulatory Visit (INDEPENDENT_AMBULATORY_CARE_PROVIDER_SITE_OTHER): Payer: Medicare Other | Admitting: Internal Medicine

## 2014-03-05 ENCOUNTER — Encounter (INDEPENDENT_AMBULATORY_CARE_PROVIDER_SITE_OTHER): Payer: Self-pay | Admitting: *Deleted

## 2014-03-05 ENCOUNTER — Other Ambulatory Visit (INDEPENDENT_AMBULATORY_CARE_PROVIDER_SITE_OTHER): Payer: Self-pay | Admitting: *Deleted

## 2014-03-05 ENCOUNTER — Telehealth (INDEPENDENT_AMBULATORY_CARE_PROVIDER_SITE_OTHER): Payer: Self-pay | Admitting: *Deleted

## 2014-03-05 ENCOUNTER — Encounter (INDEPENDENT_AMBULATORY_CARE_PROVIDER_SITE_OTHER): Payer: Self-pay | Admitting: Internal Medicine

## 2014-03-05 VITALS — BP 110/72 | HR 72 | Temp 98.5°F | Ht 72.0 in | Wt 204.1 lb

## 2014-03-05 DIAGNOSIS — K625 Hemorrhage of anus and rectum: Secondary | ICD-10-CM

## 2014-03-05 DIAGNOSIS — I2510 Atherosclerotic heart disease of native coronary artery without angina pectoris: Secondary | ICD-10-CM

## 2014-03-05 DIAGNOSIS — D649 Anemia, unspecified: Secondary | ICD-10-CM | POA: Diagnosis not present

## 2014-03-05 DIAGNOSIS — Z1211 Encounter for screening for malignant neoplasm of colon: Secondary | ICD-10-CM

## 2014-03-05 MED ORDER — PEG 3350-KCL-NA BICARB-NACL 420 G PO SOLR
4000.0000 mL | Freq: Once | ORAL | Status: DC
Start: 2014-03-05 — End: 2015-05-15

## 2014-03-05 NOTE — Patient Instructions (Signed)
Colonoscopy.  The risks and benefits such as perforation, bleeding, and infection were reviewed with the patient and is agreeable. 

## 2014-03-05 NOTE — Telephone Encounter (Signed)
Patient needs trilyte 

## 2014-03-05 NOTE — Progress Notes (Signed)
   Subjective:    Patient ID: PASTOR SGRO, male    DOB: July 27, 1945, 68 y.o.   MRN: [id number]  HPI Referred boy Dr. Gerarda Fraction for iron deficiency anemia/colonoscopy. He tell me he has not had a colonoscopy since the 1990s with hx of polyps ([place]) He has not seen any rectal bleeding. He did have a positive stool card.   He says the blood is dark.  His BMs dark in color.  Appetite is good. No weight loss.  No abdominal pain. No family hx of colon cancer. Acid reflux controlled with the PPI.   01/28/2014 Iron 67, UIBC 306, TIBC 373, %Sat 18, Vitamin B12  212, Folate 7.0, H and H 12.2 and 35.9, Ferritin 42,  MCV 87.6, Platelet ct 294 Review of Systems Retired from [employer] Widowed One child in good health        Objective:   Physical Exam  Filed Vitals:   03/05/14 1101  Height: 6' (1.829 m)  Weight: 204 lb 1.6 oz (92.579 kg)   Alert and oriented. Skin warm and dry. Oral mucosa is moist.   . Sclera anicteric, conjunctivae is pink. Thyroid not enlarged. No cervical lymphadenopathy. Lungs clear. Heart regular rate and rhythm.  Abdomen is soft. Bowel sounds are positive. No hepatomegaly. No abdominal masses felt. No tenderness.  No edema to lower extremities.   Stool brown and guaiac negative.        Assessment & Plan:  Rectal bleeding. Colonic neoplasm, polyp, AVM needs to be ruled out. Hemorrohoids also in the differential. Hx of colonic polyps

## 2014-03-11 ENCOUNTER — Other Ambulatory Visit (HOSPITAL_COMMUNITY): Payer: Self-pay | Admitting: Cardiovascular Disease

## 2014-03-11 DIAGNOSIS — I714 Abdominal aortic aneurysm, without rupture, unspecified: Secondary | ICD-10-CM

## 2014-03-19 ENCOUNTER — Encounter: Payer: Self-pay | Admitting: Cardiovascular Disease

## 2014-03-21 ENCOUNTER — Encounter (HOSPITAL_COMMUNITY)
Admission: RE | Disposition: A | Discharge status: Home / Self Care | Payer: Self-pay | Source: Ambulatory Visit | Attending: Internal Medicine

## 2014-03-21 ENCOUNTER — Ambulatory Visit (HOSPITAL_COMMUNITY)
Admission: RE | Admit: 2014-03-21 | Discharge: 2014-03-21 | Disposition: A | Discharge status: Home / Self Care | Payer: Medicare Other | Source: Ambulatory Visit | Attending: Internal Medicine | Admitting: Internal Medicine

## 2014-03-21 DIAGNOSIS — E119 Type 2 diabetes mellitus without complications: Secondary | ICD-10-CM | POA: Diagnosis not present

## 2014-03-21 DIAGNOSIS — Z95810 Presence of automatic (implantable) cardiac defibrillator: Secondary | ICD-10-CM | POA: Diagnosis not present

## 2014-03-21 DIAGNOSIS — Z951 Presence of aortocoronary bypass graft: Secondary | ICD-10-CM | POA: Insufficient documentation

## 2014-03-21 DIAGNOSIS — K598 Other specified functional intestinal disorders: Secondary | ICD-10-CM | POA: Diagnosis not present

## 2014-03-21 DIAGNOSIS — I10 Essential (primary) hypertension: Secondary | ICD-10-CM | POA: Insufficient documentation

## 2014-03-21 DIAGNOSIS — K644 Residual hemorrhoidal skin tags: Secondary | ICD-10-CM | POA: Insufficient documentation

## 2014-03-21 DIAGNOSIS — E785 Hyperlipidemia, unspecified: Secondary | ICD-10-CM | POA: Insufficient documentation

## 2014-03-21 DIAGNOSIS — I2510 Atherosclerotic heart disease of native coronary artery without angina pectoris: Secondary | ICD-10-CM | POA: Insufficient documentation

## 2014-03-21 DIAGNOSIS — R195 Other fecal abnormalities: Secondary | ICD-10-CM | POA: Diagnosis not present

## 2014-03-21 DIAGNOSIS — Z86010 Personal history of colonic polyps: Secondary | ICD-10-CM | POA: Insufficient documentation

## 2014-03-21 DIAGNOSIS — Z79899 Other long term (current) drug therapy: Secondary | ICD-10-CM | POA: Diagnosis not present

## 2014-03-21 DIAGNOSIS — K5730 Diverticulosis of large intestine without perforation or abscess without bleeding: Secondary | ICD-10-CM | POA: Diagnosis not present

## 2014-03-21 DIAGNOSIS — Z87891 Personal history of nicotine dependence: Secondary | ICD-10-CM | POA: Insufficient documentation

## 2014-03-21 DIAGNOSIS — Z7982 Long term (current) use of aspirin: Secondary | ICD-10-CM | POA: Diagnosis not present

## 2014-03-21 DIAGNOSIS — K625 Hemorrhage of anus and rectum: Secondary | ICD-10-CM

## 2014-03-21 DIAGNOSIS — K921 Melena: Secondary | ICD-10-CM | POA: Diagnosis not present

## 2014-03-21 DIAGNOSIS — D509 Iron deficiency anemia, unspecified: Secondary | ICD-10-CM | POA: Insufficient documentation

## 2014-03-21 DIAGNOSIS — K649 Unspecified hemorrhoids: Secondary | ICD-10-CM | POA: Diagnosis not present

## 2014-03-21 DIAGNOSIS — D481 Neoplasm of uncertain behavior of connective and other soft tissue: Secondary | ICD-10-CM | POA: Diagnosis not present

## 2014-03-21 HISTORY — PX: COLONOSCOPY: SHX5424

## 2014-03-21 LAB — GLUCOSE, CAPILLARY: GLUCOSE-CAPILLARY: 158 mg/dL — AB (ref 70–99)

## 2014-03-21 SURGERY — COLONOSCOPY
Anesthesia: Moderate Sedation

## 2014-03-21 MED ORDER — MIDAZOLAM HCL 5 MG/5ML IJ SOLN
INTRAMUSCULAR | Status: DC | PRN
  Administered 2014-03-21: 1 mg via INTRAVENOUS
  Administered 2014-03-21 (×2): 2 mg via INTRAVENOUS

## 2014-03-21 MED ORDER — STERILE WATER FOR IRRIGATION IR SOLN
Status: DC | PRN
  Administered 2014-03-21: 09:00:00

## 2014-03-21 MED ORDER — MEPERIDINE HCL 50 MG/ML IJ SOLN
INTRAMUSCULAR | Status: DC | PRN
  Administered 2014-03-21: 25 mg via INTRAVENOUS

## 2014-03-21 NOTE — Op Note (Signed)
COLONOSCOPY PROCEDURE REPORT  PATIENT:  Jordan Ross  MR#:  [id number] Birthdate:  November 06, 1945, 68 y.o., male Endoscopist:  Dr. Rogene Houston, MD Referred By:  Dr. Glo Herring, MD  Procedure Date: 03/21/2014  Procedure:   Colonoscopy  Indications: Patient is 68 year old Caucasian male with history of colonic polyps and recent noted to have heme-positive stool and iron deficiency anemia. Last colonoscopy was about 20 years ago.  Informed Consent:  The procedure and risks were reviewed with the patient and informed consent was obtained.  Medications:  Demerol 25 mg IV Versed 5 mg IV  Description of procedure:  After a digital rectal exam was performed, that colonoscope was advanced from the anus through the rectum and colon to the area of the cecum, ileocecal valve and appendiceal orifice. The cecum was deeply intubated. These structures were well-seen and photographed for the record. From the level of the cecum and ileocecal valve, the scope was slowly and cautiously withdrawn. The mucosal surfaces were carefully surveyed utilizing scope tip to flexion to facilitate fold flattening as needed. The scope was pulled down into the rectum where a thorough exam including retroflexion was performed.  Findings:   Prep satisfactory. Small scattered diverticula noted at transverse, descending and sigmoid colon. 12 mm submucosal lesion at mid sigmoid colon consistent with lipoma. Mucosa was edematous and erythematous therefore biopsy taken. Normal rectal mucosa. Small external hemorrhoids.    Therapeutic/Diagnostic Maneuvers Performed:  See above   Complications:  None  Cecal Withdrawal Time:  12 minutes  Impression:  Examination performed to cecum. Few small diverticula at transverse descending and sigmoid colon. 12 mm submucosal lipoma at mid sigmoid colon. Biopsy taken from overlying mucosa as it appeared erythematous and edematous. Small external  hemorrhoids.  Recommendations:  Standard instructions given. Patient will resume usual medications including ferrous fumarate. I will contact patient with biopsy results and further recommendations.  REHMAN,NAJEEB U  03/21/2014 9:17 AM  CC: Dr. Glo Herring., MD & Dr. Rayne Du ref. provider found

## 2014-03-21 NOTE — H&P (Signed)
Jordan Ross is an 68 y.o. male.   Chief Complaint: Patient is here for colonoscopy. HPI: Patient is 68 year old Caucasian male with multiple medical problems including CAD and ischemic cardiomyopathy who was found to have iron deficiency anemia. He denies abdominal pain change in bowel habits rectal bleeding or melena. His stool was noted to be heme positive by Dr. Gerarda Fraction. He has chronic GERD. He is on pantoprazole with excellent symptom control. Is also low-dose aspirin. He does not take other OTC NSAIDs. He has good appetite. There is no history of peptic ulcer disease. He has history of colonic polyps. Last colonoscopy was about 20 years ago when he was living in [place]. Family history is negative for CRC.  Past Medical History  Diagnosis Date  . Status post coronary artery bypass grafting   . Ischemic cardiomyopathy   . Abdominal aortic aneurysm   . Hypertension   . Hyperlipidemia   . Type 2 diabetes mellitus   . ICD (implantable cardioverter-defibrillator), single, in situ     No past surgical history on file.  No family history on file. Social History:  reports that he quit smoking about 15 years ago. He has never used smokeless tobacco. He reports that he drinks alcohol. He reports that he does not use illicit drugs.  Allergies: No Known Allergies  Medications Prior to Admission  Medication Sig Dispense Refill  . aspirin 81 MG tablet Take 162 mg by mouth daily.    [name] atorvastatin (LIPITOR) 40 MG tablet Take 40 mg by mouth daily.    . carvedilol (COREG) 12.5 MG tablet Take 12.5 mg by mouth 2 (two) times daily.    . digoxin (LANOXIN) 0.125 MG tablet Take 125 mcg by mouth daily.    . ferrous fumarate (HEMOCYTE - 106 MG FE) 325 (106 FE) MG TABS tablet Take 1 tablet by mouth.    [name] glipiZIDE (GLUCOTROL XL) 10 MG 24 hr tablet Take 20 mg by mouth daily with breakfast.     . metFORMIN (GLUCOPHAGE) 500 MG tablet Take 1,000 mg by mouth 2 (two) times daily with a meal.     .  niacin (NIASPAN) 500 MG CR tablet Take 500 mg by mouth daily.    . pantoprazole (PROTONIX) 40 MG tablet Take 40 mg by mouth daily.    . polyethylene glycol-electrolytes (NULYTELY/GOLYTELY) 420 G solution Take 4,000 mLs by mouth once. 4000 mL 0  . spironolactone (ALDACTONE) 25 MG tablet Take 25 mg by mouth daily.    . valsartan (DIOVAN) 160 MG tablet Take 160 mg by mouth daily.      Results for orders placed or performed during the hospital encounter of 03/21/14 (from the past 48 hour(s))  Glucose, capillary     Status: Abnormal   Collection Time: 03/21/14  8:25 AM  Result Value Ref Range   Glucose-Capillary 158 (H) 70 - 99 mg/dL   No results found.  ROS  There were no vitals taken for this visit. Physical Exam  Constitutional: He appears well-developed and well-nourished.  HENT:  Mouth/Throat: Oropharynx is clear and moist.  Eyes: Conjunctivae are normal. No scleral icterus.  Neck: No thyromegaly present.  Cardiovascular: Normal rate and regular rhythm.   Murmur (grade 3/6 systolic ejection murmur best heard at aortic area.) heard. Respiratory: Effort normal and breath sounds normal.  GI: Soft. He exhibits no distension and no mass. There is no tenderness.  Musculoskeletal: He exhibits no edema.  Lymphadenopathy:    He has no cervical adenopathy.  Neurological: He is alert.  Skin: Skin is warm and dry.     Assessment/Plan Heme positive stool and iron deficiency anemia. History of colonic polyps. Diagnostic colonoscopy.  Jeyden Coffelt U 03/21/2014, 8:41 AM

## 2014-03-21 NOTE — Discharge Instructions (Signed)
Resume usual medications including ferrous fumarate or iron pills as before. Resume high fiber diet. No driving for 24 hours. Patient will call with biopsy results.  High-Fiber Diet Fiber is found in fruits, vegetables, and grains. A high-fiber diet encourages the addition of more whole grains, legumes, fruits, and vegetables in your diet. The recommended amount of fiber for adult males is 38 g per day. For adult females, it is 25 g per day. Pregnant and lactating women should get 28 g of fiber per day. If you have a digestive or bowel problem, ask your caregiver for advice before adding high-fiber foods to your diet. Eat a variety of high-fiber foods instead of only a select few type of foods.  PURPOSE  To increase stool bulk.  To make bowel movements more regular to prevent constipation.  To lower cholesterol.  To prevent overeating. WHEN IS THIS DIET USED?  It may be used if you have constipation and hemorrhoids.  It may be used if you have uncomplicated diverticulosis (intestine condition) and irritable bowel syndrome.  It may be used if you need help with weight management.  It may be used if you want to add it to your diet as a protective measure against atherosclerosis, diabetes, and cancer. SOURCES OF FIBER  Whole-grain breads and cereals.  Fruits, such as apples, oranges, bananas, berries, prunes, and pears.  Vegetables, such as green peas, carrots, sweet potatoes, beets, broccoli, cabbage, spinach, and artichokes.  Legumes, such split peas, soy, lentils.  Almonds. FIBER CONTENT IN FOODS Starches and Grains / Dietary Fiber (g)  Cheerios, 1 cup / 3 g  Corn Flakes cereal, 1 cup / 0.7 g  Rice crispy treat cereal, 1 cup / 0.3 g  Instant oatmeal (cooked),  cup / 2 g  Frosted wheat cereal, 1 cup / 5.1 g  Brown, long-grain rice (cooked), 1 cup / 3.5 g  White, long-grain rice (cooked), 1 cup / 0.6 g  Enriched macaroni (cooked), 1 cup / 2.5 g Legumes / Dietary  Fiber (g)  Baked beans (canned, plain, or vegetarian),  cup / 5.2 g  Kidney beans (canned),  cup / 6.8 g  Pinto beans (cooked),  cup / 5.5 g Breads and Crackers / Dietary Fiber (g)  Plain or honey graham crackers, 2 squares / 0.7 g  Saltine crackers, 3 squares / 0.3 g  Plain, salted pretzels, 10 pieces / 1.8 g  Whole-wheat bread, 1 slice / 1.9 g  White bread, 1 slice / 0.7 g  Raisin bread, 1 slice / 1.2 g  Plain bagel, 3 oz / 2 g  Flour tortilla, 1 oz / 0.9 g  Corn tortilla, 1 small / 1.5 g  Hamburger or hotdog bun, 1 small / 0.9 g Fruits / Dietary Fiber (g)  Apple with skin, 1 medium / 4.4 g  Sweetened applesauce,  cup / 1.5 g  Banana,  medium / 1.5 g  Grapes, 10 grapes / 0.4 g  Orange, 1 small / 2.3 g  Raisin, 1.5 oz / 1.6 g  Melon, 1 cup / 1.4 g Vegetables / Dietary Fiber (g)  Green beans (canned),  cup / 1.3 g  Carrots (cooked),  cup / 2.3 g  Broccoli (cooked),  cup / 2.8 g  Peas (cooked),  cup / 4.4 g  Mashed potatoes,  cup / 1.6 g  Lettuce, 1 cup / 0.5 g  Corn (canned),  cup / 1.6 g  Tomato,  cup / 1.1 g Document Released: 04/04/2005 Document  Revised: 10/04/2011 Document Reviewed: 07/07/2011 [facility] Patient Information 2015 [place], [place]. This information is not intended to replace advice given to you by your health care provider. Make sure you discuss any questions you have with your health care provider. Colonoscopy, Care After These instructions give you information on caring for yourself after your procedure. Your doctor may also give you more specific instructions. Call your doctor if you have any problems or questions after your procedure. HOME CARE  Do not drive for 24 hours.  Do not sign important papers or use machinery for 24 hours.  You may shower.  You may go back to your usual activities, but go slower for the first 24 hours.  Take rest breaks often during the first 24 hours.  Walk around or use warm packs  on your belly (abdomen) if you have belly cramping or gas.  Drink enough fluids to keep your pee (urine) clear or pale yellow.  Resume your normal diet. Avoid heavy or fried foods.  Avoid drinking alcohol for 24 hours or as told by your doctor.  Only take medicines as told by your doctor. If a tissue sample (biopsy) was taken during the procedure:   Do not take aspirin or blood thinners for 7 days, or as told by your doctor.  Do not drink alcohol for 7 days, or as told by your doctor.  Eat soft foods for the first 24 hours. GET HELP IF: You still have a small amount of blood in your poop (stool) 2-3 days after the procedure. GET HELP RIGHT AWAY IF:  You have more than a small amount of blood in your poop.  You see clumps of tissue (blood clots) in your poop.  Your belly is puffy (swollen).  You feel sick to your stomach (nauseous) or throw up (vomit).  You have a fever.  You have belly pain that gets worse and medicine does not help. MAKE SURE YOU:  Understand these instructions.  Will watch your condition.  Will get help right away if you are not doing well or get worse. Document Released: 05/07/2010 Document Revised: 04/09/2013 Document Reviewed: 12/10/2012 [facility] Patient Information 2015 [place], [place]. This information is not intended to replace advice given to you by your health care provider. Make sure you discuss any questions you have with your health care provider.

## 2014-03-27 ENCOUNTER — Encounter (HOSPITAL_COMMUNITY): Payer: Self-pay | Admitting: Internal Medicine

## 2014-04-07 ENCOUNTER — Ambulatory Visit (HOSPITAL_COMMUNITY)
Admission: RE | Admit: 2014-04-07 | Discharge: 2014-04-07 | Disposition: A | Discharge status: Home / Self Care | Payer: Medicare Other | Source: Ambulatory Visit | Attending: Cardiology | Admitting: Cardiology

## 2014-04-07 ENCOUNTER — Telehealth (INDEPENDENT_AMBULATORY_CARE_PROVIDER_SITE_OTHER): Payer: Self-pay | Admitting: *Deleted

## 2014-04-07 DIAGNOSIS — I714 Abdominal aortic aneurysm, without rupture, unspecified: Secondary | ICD-10-CM

## 2014-04-07 NOTE — Telephone Encounter (Signed)
Abdurahman would like for someone to return his call with the TCS and Biopsy results. He could not make out what Dr. Laural Golden said on the answering machine. His return phone number is [phone].

## 2014-04-07 NOTE — Progress Notes (Signed)
Abdominal Aorta Duplex Completed. Kaimana Lurz, BS, RDMS, RVT  

## 2014-04-09 ENCOUNTER — Encounter: Payer: Self-pay | Admitting: *Deleted

## 2014-04-15 ENCOUNTER — Encounter (INDEPENDENT_AMBULATORY_CARE_PROVIDER_SITE_OTHER): Payer: Self-pay | Admitting: *Deleted

## 2014-04-15 NOTE — Telephone Encounter (Signed)
No answer at home. It looks like Dr. Laural Golden talked with him on the 23rd of this month.

## 2014-04-17 ENCOUNTER — Encounter (INDEPENDENT_AMBULATORY_CARE_PROVIDER_SITE_OTHER): Payer: Self-pay

## 2014-04-22 NOTE — Telephone Encounter (Signed)
Message left at home 

## 2014-05-19 ENCOUNTER — Ambulatory Visit (INDEPENDENT_AMBULATORY_CARE_PROVIDER_SITE_OTHER): Payer: Medicare Other | Admitting: Cardiovascular Disease

## 2014-05-19 ENCOUNTER — Encounter: Payer: Self-pay | Admitting: Cardiovascular Disease

## 2014-05-19 VITALS — BP 96/70 | HR 76 | Ht 72.0 in | Wt 193.7 lb

## 2014-05-19 DIAGNOSIS — I701 Atherosclerosis of renal artery: Secondary | ICD-10-CM

## 2014-05-19 DIAGNOSIS — E785 Hyperlipidemia, unspecified: Secondary | ICD-10-CM | POA: Diagnosis not present

## 2014-05-19 DIAGNOSIS — I714 Abdominal aortic aneurysm, without rupture, unspecified: Secondary | ICD-10-CM

## 2014-05-19 DIAGNOSIS — I10 Essential (primary) hypertension: Secondary | ICD-10-CM

## 2014-05-19 DIAGNOSIS — I2510 Atherosclerotic heart disease of native coronary artery without angina pectoris: Secondary | ICD-10-CM | POA: Diagnosis not present

## 2014-05-19 NOTE — Assessment & Plan Note (Signed)
History of hyperlipidemia on atorvastatin 40 mg a day followed by his PCP 

## 2014-05-19 NOTE — Assessment & Plan Note (Signed)
History of hypertension with blood pressure measured today at 96/70 on carvedilol and valsartan. Continue current meds at current dosing

## 2014-05-19 NOTE — Assessment & Plan Note (Signed)
History of of abdominal aortic aneurysm measuring 5 cm by recent ultrasound performed 04/07/14. This is followed on a semiannual basis.

## 2014-05-19 NOTE — Assessment & Plan Note (Signed)
History of bilateral renal artery stenosis followed by duplex ultrasound

## 2014-05-19 NOTE — Assessment & Plan Note (Signed)
History of CAD status post remote anterior wall myocardial infarction in 2001 with subsequent coronary artery bypass grafting by Dr. Roxy Manns in September 2001. His LAD, vein to ramus branch, circumflex and PDA. He does have ischemic coronary myopathy with an EF in the 30-35% range and has had an ICD implanted for primary prevention. This is currently followed by Dr. Sallyanne Kuster. His last Myoview performed 04/07/11 showed anterior scar without ischemia. He denies chest pain or shortness of breath.

## 2014-05-19 NOTE — Patient Instructions (Signed)
Your physician wants you to follow-up in 1 year with Dr. Berry. You will receive a reminder letter in the mail 2 months in advance. If you do not receive a letter, please call our office to schedule the follow-up appointment.  

## 2014-05-19 NOTE — Progress Notes (Signed)
05/19/2014 Jordan Ross   03/27/1946  [id number]  Primary Physician Glo Herring., MD Primary Cardiologist: Lorretta Harp MD Renae Gloss'  HPI:  [name] is a 69-year-old mildly overweight widowed Caucasian male father of one child who is retired from working at [employer] and [employer]. He is formally a patient of Dr. Terance Ice I last saw him one year ago.Jordan Ross He is a long complicated past medical history notable for coronary artery disease status post anterior wall myocardial infarction in 2001 treated with coronary artery bypass grafting by Dr. Roxy Manns September 2001. His LIMA to his LAD, vein graft to a ramus branch, circumflex and PDA. His EF was in the 35% range.essentially underwent ICD implantation for primary prevention October 2000 which was followed by Dr. Rollene Fare. His other problems include a known abdominal aortic aneurysm measuring 5 cm following the ultrasound semiannually. He has a history of hypertension, hyperlipidemia and non-insulin-requiring diabetes. He has chronic shortness of breath is likely related to a history of tobacco abuse having smoked 40-pack-years and quit back in 2001 at the time of his open heart surgery but denies chest pain. The stress Myoview performed 04/07/11 showed anterior scar without ischemia. Since I saw him one year ago he has remained asymptomatic. Dr. Sallyanne Kuster follows him for his ICD.   Current Outpatient Prescriptions  Medication Sig Dispense Refill  . aspirin 81 MG tablet Take 162 mg by mouth daily.    Jordan Ross atorvastatin (LIPITOR) 40 MG tablet Take 40 mg by mouth daily.    . carvedilol (COREG) 12.5 MG tablet Take 12.5 mg by mouth 2 (two) times daily.    . digoxin (LANOXIN) 0.125 MG tablet Take 125 mcg by mouth daily.    . ferrous fumarate (HEMOCYTE - 106 MG FE) 325 (106 FE) MG TABS tablet Take 1 tablet by mouth.    Jordan Ross glipiZIDE (GLUCOTROL XL) 10 MG 24 hr tablet Take 20 mg by mouth daily with breakfast.     .  INVOKANA 300 MG TABS tablet     . metFORMIN (GLUCOPHAGE) 500 MG tablet Take 1,000 mg by mouth 2 (two) times daily with a meal.     . niacin (NIASPAN) 500 MG CR tablet Take 500 mg by mouth daily.    . pantoprazole (PROTONIX) 40 MG tablet Take 40 mg by mouth daily.    . polyethylene glycol-electrolytes (NULYTELY/GOLYTELY) 420 G solution Take 4,000 mLs by mouth once. 4000 mL 0  . spironolactone (ALDACTONE) 25 MG tablet Take 25 mg by mouth daily.    . valsartan (DIOVAN) 160 MG tablet Take 160 mg by mouth daily.     No current facility-administered medications for this visit.    No Known Allergies  History   Social History  . Marital Status: Widowed    Spouse Name: N/A    Number of Children: N/A  . Years of Education: N/A   Occupational History  . Not on file.   Social History Main Topics  . Smoking status: Former Smoker -- 61 years    Quit date: 11/13/1998  . Smokeless tobacco: Never Used  . Alcohol Use: Yes     Comment: occasional   . Drug Use: No  . Sexual Activity: Not on file   Other Topics Concern  . Not on file   Social History Narrative     Review of Systems: General: negative for chills, fever, night sweats or weight changes.  Cardiovascular: negative for chest pain, dyspnea on exertion, edema, orthopnea,  palpitations, paroxysmal nocturnal dyspnea or shortness of breath Dermatological: negative for rash Respiratory: negative for cough or wheezing Urologic: negative for hematuria Abdominal: negative for nausea, vomiting, diarrhea, bright red blood per rectum, melena, or hematemesis Neurologic: negative for visual changes, syncope, or dizziness All other systems reviewed and are otherwise negative except as noted above.    Blood pressure 96/70, pulse 76, height 6' (1.829 m), weight 193 lb 11.2 oz (87.862 kg).  General appearance: alert and no distress Neck: no adenopathy, no carotid bruit, no JVD, supple, symmetrical, trachea midline and thyroid not enlarged,  symmetric, no tenderness/mass/nodules Lungs: clear to auscultation bilaterally Heart: regular rate and rhythm, S1, S2 normal, no murmur, click, rub or gallop Extremities: extremities normal, atraumatic, no cyanosis or edema  EKG normal sinus rhythm at 76 with poor R-wave progression and occasional PVCs. There is persistent J-point elevation in the anterior precordial leads with left axis deviation. I personally reviewed this EKG  ASSESSMENT AND PLAN:   Hyperlipidemia History of hyperlipidemia on atorvastatin 40 mg a day followed by his PCP   Essential hypertension History of hypertension with blood pressure measured today at 96/70 on carvedilol and valsartan. Continue current meds at current dosing   Coronary artery disease History of CAD status post remote anterior wall myocardial infarction in 2001 with subsequent coronary artery bypass grafting by Dr. Roxy Manns in September 2001. His LAD, vein to ramus branch, circumflex and PDA. He does have ischemic coronary myopathy with an EF in the 30-35% range and has had an ICD implanted for primary prevention. This is currently followed by Dr. Sallyanne Kuster. His last Myoview performed 04/07/11 showed anterior scar without ischemia. He denies chest pain or shortness of breath.   Bilateral renal artery stenosis History of bilateral renal artery stenosis followed by duplex ultrasound   Abdominal aortic aneurysm History of of abdominal aortic aneurysm measuring 5 cm by recent ultrasound performed 04/07/14. This is followed on a semiannual basis.       Lorretta Harp MD FACP,FACC,FAHA, [facility] 05/19/2014 5:13 PM

## 2014-05-20 ENCOUNTER — Ambulatory Visit (INDEPENDENT_AMBULATORY_CARE_PROVIDER_SITE_OTHER): Payer: Medicare Other | Admitting: *Deleted

## 2014-05-20 DIAGNOSIS — I255 Ischemic cardiomyopathy: Secondary | ICD-10-CM | POA: Diagnosis not present

## 2014-05-20 NOTE — Progress Notes (Signed)
Remote ICD transmission.   

## 2014-05-21 LAB — MDC_IDC_ENUM_SESS_TYPE_REMOTE
Battery Voltage: 2.95 V
Brady Statistic RV Percent Paced: 1 %
Date Time Interrogation Session: 20160202072537
HIGH POWER IMPEDANCE MEASURED VALUE: 48 Ohm
Implantable Pulse Generator Serial Number: 737526
Lead Channel Pacing Threshold Amplitude: 0.5 V
Lead Channel Sensing Intrinsic Amplitude: 11.9 mV
MDC IDC MSMT BATTERY REMAINING LONGEVITY: 71 mo
MDC IDC MSMT BATTERY REMAINING PERCENTAGE: 60 %
MDC IDC MSMT LEADCHNL RV IMPEDANCE VALUE: 480 Ohm
MDC IDC MSMT LEADCHNL RV PACING THRESHOLD PULSEWIDTH: 0.4 ms
MDC IDC SET LEADCHNL RV PACING AMPLITUDE: 2.5 V
MDC IDC SET LEADCHNL RV PACING PULSEWIDTH: 0.4 ms
MDC IDC SET LEADCHNL RV SENSING SENSITIVITY: 0.5 mV
Zone Setting Detection Interval: 310 ms

## 2014-05-23 ENCOUNTER — Ambulatory Visit: Payer: Medicare Other | Admitting: Cardiovascular Disease

## 2014-06-02 ENCOUNTER — Encounter: Payer: Self-pay | Admitting: Cardiology

## 2014-06-06 ENCOUNTER — Encounter: Payer: Self-pay | Admitting: Cardiovascular Disease

## 2014-08-19 ENCOUNTER — Ambulatory Visit (INDEPENDENT_AMBULATORY_CARE_PROVIDER_SITE_OTHER): Payer: Medicare Other | Admitting: *Deleted

## 2014-08-19 ENCOUNTER — Encounter: Payer: Self-pay | Admitting: Cardiovascular Disease

## 2014-08-19 DIAGNOSIS — I255 Ischemic cardiomyopathy: Secondary | ICD-10-CM | POA: Diagnosis not present

## 2014-08-27 NOTE — Progress Notes (Signed)
Remote ICD transmission.   

## 2014-08-28 LAB — CUP PACEART REMOTE DEVICE CHECK
Battery Remaining Longevity: 68 mo
Battery Remaining Percentage: 58 %
HighPow Impedance: 45 Ohm
Lead Channel Pacing Threshold Pulse Width: 0.4 ms
Lead Channel Sensing Intrinsic Amplitude: 11.9 mV
Lead Channel Setting Pacing Amplitude: 2.5 V
Lead Channel Setting Sensing Sensitivity: 0.5 mV
MDC IDC MSMT BATTERY VOLTAGE: 2.95 V
MDC IDC MSMT LEADCHNL RV IMPEDANCE VALUE: 440 Ohm
MDC IDC MSMT LEADCHNL RV PACING THRESHOLD AMPLITUDE: 0.5 V
MDC IDC SESS DTM: 20160503075222
MDC IDC SET LEADCHNL RV PACING PULSEWIDTH: 0.4 ms
MDC IDC SET ZONE DETECTION INTERVAL: 310 ms
MDC IDC STAT BRADY RV PERCENT PACED: 1 %
Pulse Gen Serial Number: 737526

## 2014-09-02 ENCOUNTER — Telehealth: Payer: Self-pay | Admitting: *Deleted

## 2014-09-02 NOTE — Telephone Encounter (Signed)
Called pt regarding abnormal Corvue. Pt denies any ShOB, LE edema, abdominal bloat, or CP. Pt states that "everything has been just fine". Will inform Dr.Croitoru. Patient voiced understanding.

## 2014-09-02 NOTE — Telephone Encounter (Signed)
Thanks. No changes in meds or follow up

## 2014-09-08 DIAGNOSIS — Z6827 Body mass index (BMI) 27.0-27.9, adult: Secondary | ICD-10-CM | POA: Diagnosis not present

## 2014-09-08 DIAGNOSIS — K219 Gastro-esophageal reflux disease without esophagitis: Secondary | ICD-10-CM | POA: Diagnosis not present

## 2014-09-08 DIAGNOSIS — E1129 Type 2 diabetes mellitus with other diabetic kidney complication: Secondary | ICD-10-CM | POA: Diagnosis not present

## 2014-09-08 DIAGNOSIS — E291 Testicular hypofunction: Secondary | ICD-10-CM | POA: Diagnosis not present

## 2014-09-08 DIAGNOSIS — Z0001 Encounter for general adult medical examination with abnormal findings: Secondary | ICD-10-CM | POA: Diagnosis not present

## 2014-09-08 DIAGNOSIS — I10 Essential (primary) hypertension: Secondary | ICD-10-CM | POA: Diagnosis not present

## 2014-09-08 DIAGNOSIS — E663 Overweight: Secondary | ICD-10-CM | POA: Diagnosis not present

## 2014-09-08 DIAGNOSIS — Z1389 Encounter for screening for other disorder: Secondary | ICD-10-CM | POA: Diagnosis not present

## 2014-09-11 ENCOUNTER — Encounter: Payer: Self-pay | Admitting: Cardiology

## 2014-09-11 DIAGNOSIS — E663 Overweight: Secondary | ICD-10-CM | POA: Diagnosis not present

## 2014-09-11 DIAGNOSIS — Z0000 Encounter for general adult medical examination without abnormal findings: Secondary | ICD-10-CM | POA: Diagnosis not present

## 2014-09-11 DIAGNOSIS — E291 Testicular hypofunction: Secondary | ICD-10-CM | POA: Diagnosis not present

## 2014-09-11 DIAGNOSIS — Z1389 Encounter for screening for other disorder: Secondary | ICD-10-CM | POA: Diagnosis not present

## 2014-09-11 DIAGNOSIS — E1129 Type 2 diabetes mellitus with other diabetic kidney complication: Secondary | ICD-10-CM | POA: Diagnosis not present

## 2014-09-11 DIAGNOSIS — Z125 Encounter for screening for malignant neoplasm of prostate: Secondary | ICD-10-CM | POA: Diagnosis not present

## 2014-09-11 DIAGNOSIS — Z6827 Body mass index (BMI) 27.0-27.9, adult: Secondary | ICD-10-CM | POA: Diagnosis not present

## 2014-09-11 DIAGNOSIS — E538 Deficiency of other specified B group vitamins: Secondary | ICD-10-CM | POA: Diagnosis not present

## 2014-09-18 DIAGNOSIS — E538 Deficiency of other specified B group vitamins: Secondary | ICD-10-CM | POA: Diagnosis not present

## 2014-10-28 ENCOUNTER — Other Ambulatory Visit: Payer: Self-pay | Admitting: Cardiovascular Disease

## 2014-10-28 DIAGNOSIS — I10 Essential (primary) hypertension: Secondary | ICD-10-CM | POA: Diagnosis not present

## 2014-10-28 DIAGNOSIS — I714 Abdominal aortic aneurysm, without rupture, unspecified: Secondary | ICD-10-CM

## 2014-10-28 DIAGNOSIS — E1165 Type 2 diabetes mellitus with hyperglycemia: Secondary | ICD-10-CM | POA: Diagnosis not present

## 2014-10-28 DIAGNOSIS — E785 Hyperlipidemia, unspecified: Secondary | ICD-10-CM | POA: Diagnosis not present

## 2014-10-28 DIAGNOSIS — I701 Atherosclerosis of renal artery: Secondary | ICD-10-CM

## 2014-11-06 DIAGNOSIS — E1165 Type 2 diabetes mellitus with hyperglycemia: Secondary | ICD-10-CM | POA: Diagnosis not present

## 2014-11-06 DIAGNOSIS — E785 Hyperlipidemia, unspecified: Secondary | ICD-10-CM | POA: Diagnosis not present

## 2014-11-06 DIAGNOSIS — I10 Essential (primary) hypertension: Secondary | ICD-10-CM | POA: Diagnosis not present

## 2014-11-12 ENCOUNTER — Encounter: Payer: Self-pay | Admitting: Cardiovascular Disease

## 2014-11-27 ENCOUNTER — Ambulatory Visit (HOSPITAL_COMMUNITY)
Admission: RE | Admit: 2014-11-27 | Discharge: 2014-11-27 | Disposition: A | Discharge status: Home / Self Care | Payer: Medicare Other | Source: Ambulatory Visit | Attending: Cardiology | Admitting: Cardiology

## 2014-11-27 ENCOUNTER — Inpatient Hospital Stay (HOSPITAL_COMMUNITY): Admission: RE | Admit: 2014-11-27 | Payer: Medicare Other | Source: Ambulatory Visit

## 2014-11-27 DIAGNOSIS — I701 Atherosclerosis of renal artery: Secondary | ICD-10-CM | POA: Diagnosis not present

## 2014-11-28 ENCOUNTER — Other Ambulatory Visit (HOSPITAL_COMMUNITY): Payer: Medicare Other

## 2014-12-16 ENCOUNTER — Encounter: Payer: Self-pay | Admitting: Cardiovascular Disease

## 2014-12-16 ENCOUNTER — Ambulatory Visit (INDEPENDENT_AMBULATORY_CARE_PROVIDER_SITE_OTHER): Payer: Medicare Other | Admitting: Cardiovascular Disease

## 2014-12-16 VITALS — BP 120/80 | HR 58 | Ht 72.0 in | Wt 190.0 lb

## 2014-12-16 DIAGNOSIS — I701 Atherosclerosis of renal artery: Secondary | ICD-10-CM

## 2014-12-16 DIAGNOSIS — Z95810 Presence of automatic (implantable) cardiac defibrillator: Secondary | ICD-10-CM | POA: Diagnosis not present

## 2014-12-16 DIAGNOSIS — I255 Ischemic cardiomyopathy: Secondary | ICD-10-CM

## 2014-12-16 LAB — CUP PACEART INCLINIC DEVICE CHECK
Battery Remaining Longevity: 66 mo
HighPow Impedance: 46.4608
Lead Channel Impedance Value: 462.5 Ohm
Lead Channel Sensing Intrinsic Amplitude: 11.9 mV
Lead Channel Setting Sensing Sensitivity: 0.5 mV
MDC IDC MSMT LEADCHNL RV PACING THRESHOLD AMPLITUDE: 0.5 V
MDC IDC MSMT LEADCHNL RV PACING THRESHOLD AMPLITUDE: 0.5 V
MDC IDC MSMT LEADCHNL RV PACING THRESHOLD PULSEWIDTH: 0.4 ms
MDC IDC MSMT LEADCHNL RV PACING THRESHOLD PULSEWIDTH: 0.4 ms
MDC IDC PG SERIAL: 737526
MDC IDC SESS DTM: 20160830101931
MDC IDC SET LEADCHNL RV PACING AMPLITUDE: 2.5 V
MDC IDC SET LEADCHNL RV PACING PULSEWIDTH: 0.4 ms
MDC IDC STAT BRADY RV PERCENT PACED: 0.17 %
Zone Setting Detection Interval: 310 ms

## 2014-12-16 NOTE — Patient Instructions (Signed)
Remote monitoring is used to monitor your ICD from home. This monitoring reduces the number of office visits required to check your device to one time per year. It allows [place] to monitor the functioning of your device to ensure it is working properly. You are scheduled for a device check from home on March 18, 2015. You may send your transmission at any time that day. If you have a wireless device, the transmission will be sent automatically. After your physician reviews your transmission, you will receive a postcard with your next transmission date.  Dr. Sallyanne Kuster recommends that you schedule a follow-up appointment in: [place]

## 2014-12-16 NOTE — Progress Notes (Signed)
Patient ID: Jordan Ross, male   DOB: 26-Nov-1945, 69 y.o.   MRN: [id number]     Cardiology Office Note   Date:  12/16/2014   ID:  Jordan Ross, DOB [birth year], MRN [id number]  PCP:  Jordan Herring., MD  Cardiologist:  Jordan Burow, MD;  Jordan Klein, MD   Chief Complaint  Patient presents with  . Annual Exam    Patient has no complaints.      History of Present Illness: Jordan Ross is a 70 y.o. male who presents for defibrillator follow up. He feels great, just spent a week at the [facility].  He presented with an anterior wall myocardial infarction in 2001 treated with coronary artery bypass grafting by Jordan Ross (His LIMA to his LAD, vein graft to a ramus branch, circumflex and PDA). His EF was in the 35% range. He underwent ICD implantation (single-chamber St. Jude Fortify) for primary prevention October 2010. He has not received either appropriate or inappropriate shocks. His other problems include a known abdominal aortic aneurysm measuring 5 cm followed by ultrasound semiannually. He has a history of hypertension, hyperlipidemia and non-insulin-requiring diabetes. He has chronic shortness of breath, likely related to a history of tobacco abuse, having smoked 40-pack-years, quit 2001 at the time of his open heart surgery. He denies chest pain. The stress Myoview performed 04/07/11 showed anterior scar without ischemia.  Here today for defibrillator check, he has not had any new cardiovascular events. His abdominal aortic aneurysm still measures around 5 cm in diameter. His electrocardiogram shows a "frozen" anterior myocardial infarction. His dyspnea remains at baseline. In the month of July and August he shows a drop in his thoracic impedance measured by [facility], but no worsening dyspnea around that time .The interrogation otherwise shows normal function. There is virtually no ventricular pacing.   Last November he had a 15-beat episode of NSVT. Today, detection was taken out from  8 beats to 16 beats.   Past Medical History  Diagnosis Date  . Status post coronary artery bypass grafting   . Ischemic cardiomyopathy   . Abdominal aortic aneurysm   . Hypertension   . Hyperlipidemia   . Type 2 diabetes mellitus   . ICD (implantable cardioverter-defibrillator), single, in situ   . Coronary artery disease     AWMI 2001  . LV dysfunction     Past Surgical History  Procedure Laterality Date  . Colonoscopy N/A 03/21/2014    Procedure: COLONOSCOPY;  Surgeon: Jordan Houston, MD;  [facility];  Service: Endoscopy;  Laterality: N/A;  855 - moved to 12/4 @ 8:30 - Ann notified pt  . Doppler echocardiography      2011 38% EF.2013 EF 30-35%     Current Outpatient Prescriptions  Medication Sig Dispense Refill  . aspirin 81 MG tablet Take 81 mg by mouth daily.     [name] atorvastatin (LIPITOR) 40 MG tablet Take 40 mg by mouth daily.    . carvedilol (COREG) 12.5 MG tablet Take 12.5 mg by mouth 2 (two) times daily.    . digoxin (LANOXIN) 0.125 MG tablet Take 125 mcg by mouth daily.    . ferrous fumarate (HEMOCYTE - 106 MG FE) 325 (106 FE) MG TABS tablet Take 1 tablet by mouth.    . INVOKANA 300 MG TABS tablet     . IRON PO Take 65 mg by mouth daily.    . metFORMIN (GLUCOPHAGE) 500 MG tablet Take 1,000 mg by mouth 2 (two) times daily  with a meal.     . niacin (NIASPAN) 500 MG CR tablet Take 500 mg by mouth daily.    . pantoprazole (PROTONIX) 40 MG tablet Take 40 mg by mouth daily.    . polyethylene glycol-electrolytes (NULYTELY/GOLYTELY) 420 G solution Take 4,000 mLs by mouth once. 4000 mL 0  . spironolactone (ALDACTONE) 25 MG tablet Take 25 mg by mouth daily.    . valsartan (DIOVAN) 160 MG tablet Take 160 mg by mouth daily.     No current facility-administered medications for this visit.    Allergies:   Review of patient's allergies indicates no known allergies.    Social History:  The patient  reports that he quit smoking about 16 years ago. He has never  used smokeless tobacco. He reports that he drinks alcohol. He reports that he does not use illicit drugs.    ROS:  Please see the history of present illness.    Otherwise, review of systems positive for none.   All other systems are reviewed and negative.    PHYSICAL EXAM: VS:  BP 120/80 mmHg  Pulse 58  Ht 6' (1.829 m)  Wt 190 lb (86.183 kg)  BMI 25.76 kg/m2 , BMI Body mass index is 25.76 kg/(m^2).  General: Alert, oriented x3, no distress Head: no evidence of trauma, PERRL, EOMI, no exophtalmos or lid lag, no myxedema, no xanthelasma; normal ears, nose and oropharynx Neck: normal jugular venous pulsations and no hepatojugular reflux; brisk carotid pulses without delay and no carotid bruits Chest: clear to auscultation, no signs of consolidation by percussion or palpation, normal fremitus, symmetrical and full respiratory excursions Cardiovascular: normal position and quality of the apical impulse, regular rhythm, normal first and second heart sounds, no murmurs, rubs or gallops. Healthy left subclavian ICD site. Abdomen: no tenderness or distention, no masses by palpation, no abnormal pulsatility or arterial bruits, normal bowel sounds, no hepatosplenomegaly Extremities: no clubbing, cyanosis or edema; 2+ radial, ulnar and brachial pulses bilaterally; 2+ right femoral, posterior tibial and dorsalis pedis pulses; 2+ left femoral, posterior tibial and dorsalis pedis pulses; no subclavian or femoral bruits Neurological: grossly nonfocal Psych: euthymic mood, full affect   EKG:  EKG is ordered today. The ekg ordered today demonstrates Normal sinus rhythm, left axis deviation, frozen anterior MI, left ventricular hypertrophy   Recent Labs: No results found for requested labs within last 365 days.    Lipid Panel No results found for: CHOL, TRIG, HDL, CHOLHDL, VLDL, LDLCALC, LDLDIRECT    Wt Readings from Last 3 Encounters:  12/16/14 190 lb (86.183 kg)  05/19/14 193 lb 11.2 oz  (87.862 kg)  03/05/14 204 lb 1.6 oz (92.579 kg)      ASSESSMENT AND PLAN:  Normal ICD function. Tachy detection taken out to 16 intervals to reduce likelihood of unnecessary shocks. Reminded about the need for dietary sodium restriction.  Followup with Dr. Gwenlyn Found for congestive heart failure and CAD. Remote Carelink Q3 months and office ICD check in 12 months.    Current medicines are reviewed at length with the patient today.  The patient does not have concerns regarding medicines.  The following changes have been made:  no change  Labs/ tests ordered today include:  No orders of the defined types were placed in this encounter.    Patient Instructions  Remote monitoring is used to monitor your ICD from home. This monitoring reduces the number of office visits required to check your device to one time per year. It allows [place] to  monitor the functioning of your device to ensure it is working properly. You are scheduled for a device check from home on March 18, 2015. You may send your transmission at any time that day. If you have a wireless device, the transmission will be sent automatically. After your physician reviews your transmission, you will receive a postcard with your next transmission date.  Dr. Sallyanne Kuster recommends that you schedule a follow-up appointment in: ONE YEAR        SignedSanda Klein, MD  12/16/2014 9:24 AM    Jordan Klein, MD, [facility] [phone] office [phone] pager

## 2014-12-16 NOTE — Addendum Note (Signed)
Addended by: Janett Labella A on: 12/16/2014 02:07 PM   Modules accepted: Orders

## 2014-12-17 DIAGNOSIS — I701 Atherosclerosis of renal artery: Secondary | ICD-10-CM | POA: Diagnosis not present

## 2014-12-25 DIAGNOSIS — I10 Essential (primary) hypertension: Secondary | ICD-10-CM | POA: Diagnosis not present

## 2014-12-25 DIAGNOSIS — E1159 Type 2 diabetes mellitus with other circulatory complications: Secondary | ICD-10-CM | POA: Diagnosis not present

## 2014-12-25 DIAGNOSIS — E782 Mixed hyperlipidemia: Secondary | ICD-10-CM | POA: Diagnosis not present

## 2014-12-30 ENCOUNTER — Encounter: Payer: Self-pay | Admitting: Cardiovascular Disease

## 2015-01-02 ENCOUNTER — Encounter: Payer: Medicare Other | Attending: Internal Medicine | Admitting: Nutrition

## 2015-01-02 ENCOUNTER — Encounter: Payer: Self-pay | Admitting: Nutrition

## 2015-01-02 VITALS — Ht 72.0 in | Wt 189.9 lb

## 2015-01-02 DIAGNOSIS — E1165 Type 2 diabetes mellitus with hyperglycemia: Secondary | ICD-10-CM

## 2015-01-02 DIAGNOSIS — I10 Essential (primary) hypertension: Secondary | ICD-10-CM | POA: Diagnosis not present

## 2015-01-02 DIAGNOSIS — E118 Type 2 diabetes mellitus with unspecified complications: Secondary | ICD-10-CM | POA: Insufficient documentation

## 2015-01-02 DIAGNOSIS — E785 Hyperlipidemia, unspecified: Secondary | ICD-10-CM | POA: Diagnosis not present

## 2015-01-02 DIAGNOSIS — IMO0002 Reserved for concepts with insufficient information to code with codable children: Secondary | ICD-10-CM

## 2015-01-02 DIAGNOSIS — Z713 Dietary counseling and surveillance: Secondary | ICD-10-CM | POA: Diagnosis not present

## 2015-01-02 DIAGNOSIS — E1159 Type 2 diabetes mellitus with other circulatory complications: Secondary | ICD-10-CM | POA: Diagnosis not present

## 2015-01-02 NOTE — Patient Instructions (Signed)
  Goals 1. Follow the Plate Method 2.. Cut out sodas and change to diet sodas. 3.  Will drink three 5 bottles of water. 4 .Walk 30 minutes 5 days per day. 5. Get A1C down to 7% in three months.

## 2015-01-02 NOTE — Progress Notes (Signed)
Diabetes Self-Management Education  Visit Type: First/Initial  Appt. Start Time: 1330 Appt. End Time: 1430  01/02/2015  [name], identified by name and date of birth, is a 69 y.o. male with a diagnosis of Diabetes: Type 2.  He lives by himself. His daughter is here with him. Usually only eats 2 meals per day. Sleeps late and doesn't eat til close to 11 am. Drinks regular sodas. Wants to get his A1C down. Feels good with recent weight loss from Invokana 300 mg/d.[name] Taking Metformin 1000 mg BID. Tests his BS once a day. Cooks some at home but eats out a lot. Has cut down on sweets and snacks. Doesn't drink much water but willing to start. Active in the yard.  ASSESSMENT  Height 6' (1.829 m), weight 189 lb 14.4 oz (86.138 kg). Body mass index is 25.75 kg/(m^2).      Diabetes Self-Management Education - 01/02/15 1507    Visit Information   Visit Type First/Initial   Initial Visit   Diabetes Type Type 2   Are you taking your medications as prescribed? Yes   Health Coping   How would you rate your overall health? Good   Psychosocial Assessment   Patient Belief/Attitude about Diabetes Motivated to manage diabetes   Self-care barriers None   Self-management support Friends;Family;Doctor's office   Other persons present Patient;Family Member   Patient Concerns Nutrition/Meal planning;Monitoring;Healthy Lifestyle;Glycemic Control   Special Needs None   Preferred Learning Style No preference indicated   Learning Readiness Ready   How often do you need to have someone help you when you read instructions, pamphlets, or other written materials from your doctor or pharmacy? 2 - Rarely   What is the last grade level you completed in school? 11   Complications   Last HgB A1C per patient/outside source 8.7 %   How often do you check your blood sugar? 1-2 times/day   Fasting Blood glucose range (mg/dL) 180-200   Number of hypoglycemic episodes per month 0   Number of hyperglycemic  episodes per week 6   Can you tell when your blood sugar is high? No   Have you had a dilated eye exam in the past 12 months? No   Have you had a dental exam in the past 12 months? No   Are you checking your feet? Yes   How many days per week are you checking your feet? 7   Dietary Intake   Breakfast Sausage, egg biscuit, Dr. Malachi Bonds  Usually skips it due to sleeping in late.   Lunch eggs, pancakes, coke, fried potatoes/onions   Dinner [employer], crackers, coke   Snack (evening) meat skins, water,   Beverage(s) sodas, some water, coffee   Exercise   Exercise Type Light (walking / raking leaves)   How many days per week to you exercise? 2   How many minutes per day do you exercise? 20   Total minutes per week of exercise 40   Patient Education   Previous Diabetes Education No   Disease state  Definition of diabetes, type 1 and 2, and the diagnosis of diabetes;Explored patient's options for treatment of their diabetes   Nutrition management  Role of diet in the treatment of diabetes and the relationship between the three main macronutrients and blood glucose level;Carbohydrate counting;Reviewed blood glucose goals for pre and post meals and how to evaluate the patients' food intake on their blood glucose level.;Meal timing in regards to the patients' current diabetes  medication.;Information on hints to eating out and maintain blood glucose control.;Meal options for control of blood glucose level and chronic complications.   Physical activity and exercise  Role of exercise on diabetes management, blood pressure control and cardiac health.;Helped patient identify appropriate exercises in relation to his/her diabetes, diabetes complications and other health issue.   Medications Reviewed patients medication for diabetes, action, purpose, timing of dose and side effects.;Reviewed medication adjustment guidelines for hyperglycemia and sick days.   Monitoring Taught/evaluated SMBG meter.;Purpose  and frequency of SMBG.;Taught/discussed recording of test results and interpretation of SMBG.;Identified appropriate SMBG and/or A1C goals.;Daily foot exams;Yearly dilated eye exam   Acute complications Discussed and identified patients' treatment of hyperglycemia.;Covered sick day management with medication and food.   Chronic complications Relationship between chronic complications and blood glucose control;Identified and discussed with patient  current chronic complications;Reviewed with patient heart disease, higher risk of, and prevention   Psychosocial adjustment Worked with patient to identify barriers to care and solutions;Identified and addressed patients feelings and concerns about diabetes   Personal strategies to promote health Lifestyle issues that need to be addressed for better diabetes care;Helped patient develop diabetes management plan for (enter comment)   Individualized Goals (developed by patient)   Nutrition Follow meal plan discussed;General guidelines for healthy choices and portions discussed;Adjust meds/carbs with exercise as discussed   Physical Activity Exercise 3-5 times per week;30 minutes per day   Medications take my medication as prescribed   Monitoring  test my blood glucose as discussed;test blood glucose pre and post meals as discussed   Reducing Risk examine blood glucose patterns;do foot checks daily;increase portions of healthy fats   Health Coping discuss diabetes with (comment)  daughter and MD   Outcomes   Expected Outcomes Demonstrated interest in learning. Expect positive outcomes   Future DMSE 4-6 wks   Program Status Completed      Individualized Plan for Diabetes Self-Management Training:   Learning Objective:  Patient will have a greater understanding of diabetes self-management. Patient education plan is to attend individual and/or group sessions per assessed needs and concerns.   Plan:   Patient Instructions   Goals 1. Follow the Plate  Method 2.. Cut out sodas and change to diet sodas. 3.  Will drink three 5 bottles of water. 4 .Walk 30 minutes 5 days per day. 5. Get A1C down to 7% in three months.    Expected Outcomes:  Demonstrated interest in learning. Expect positive outcomes  Education material provided: Living Well with Diabetes, A1C conversion sheet, Meal plan card, My Plate and Carbohydrate counting sheet  If problems or questions, patient to contact team via:  Phone  Future DSME appointment: 4-6 wks

## 2015-01-12 DIAGNOSIS — E1159 Type 2 diabetes mellitus with other circulatory complications: Secondary | ICD-10-CM | POA: Diagnosis not present

## 2015-01-12 DIAGNOSIS — I10 Essential (primary) hypertension: Secondary | ICD-10-CM | POA: Diagnosis not present

## 2015-01-12 DIAGNOSIS — E785 Hyperlipidemia, unspecified: Secondary | ICD-10-CM | POA: Diagnosis not present

## 2015-01-13 ENCOUNTER — Telehealth: Payer: Self-pay | Admitting: "Endocrinology

## 2015-01-13 NOTE — Telephone Encounter (Signed)
Left message for pt that prescription was called in.

## 2015-01-13 NOTE — Telephone Encounter (Signed)
Miranda, the daughter called in to say that [facility] hasn't received an insulin script. The patient was told we would send in a new script  Either call the daughter back [phone] or call [place] back at [phone]

## 2015-01-22 ENCOUNTER — Ambulatory Visit: Payer: Self-pay | Admitting: "Endocrinology

## 2015-02-04 ENCOUNTER — Encounter: Payer: Self-pay | Admitting: Nutrition

## 2015-02-04 ENCOUNTER — Encounter: Payer: Medicare Other | Attending: Internal Medicine | Admitting: Nutrition

## 2015-02-04 DIAGNOSIS — Z794 Long term (current) use of insulin: Secondary | ICD-10-CM | POA: Diagnosis not present

## 2015-02-04 DIAGNOSIS — E1165 Type 2 diabetes mellitus with hyperglycemia: Secondary | ICD-10-CM | POA: Insufficient documentation

## 2015-02-04 DIAGNOSIS — E669 Obesity, unspecified: Secondary | ICD-10-CM | POA: Diagnosis not present

## 2015-02-04 DIAGNOSIS — IMO0002 Reserved for concepts with insufficient information to code with codable children: Secondary | ICD-10-CM

## 2015-02-04 DIAGNOSIS — E118 Type 2 diabetes mellitus with unspecified complications: Secondary | ICD-10-CM

## 2015-02-04 DIAGNOSIS — Z713 Dietary counseling and surveillance: Secondary | ICD-10-CM | POA: Diagnosis not present

## 2015-02-04 DIAGNOSIS — Z6825 Body mass index (BMI) 25.0-25.9, adult: Secondary | ICD-10-CM | POA: Diagnosis not present

## 2015-02-04 NOTE — Progress Notes (Signed)
Diabetes Self-Management Education  Visit Type: Follow-up  Appt. Start Time: 1100 Appt. End Time: 1130 02/05/2015  [name], identified by name and date of birth, is a 69 y.o. male with a diagnosis of Diabetes:  .   ASSESSMENT  Wt Readings from Last 3 Encounters:  01/02/15 189 lb 14.4 oz (86.138 kg)  12/16/14 190 lb (86.183 kg)  05/19/14 193 lb 11.2 oz (87.862 kg)   Ht Readings from Last 3 Encounters:  01/02/15 6' (1.829 m)  12/16/14 6' (1.829 m)  05/19/14 6' (1.829 m)   There is no weight on file to calculate BMI.       Diabetes Self-Management Education - 02/04/15 1437    Visit Information   Visit Type Follow-up      Individualized Plan for Diabetes Self-Management Training:   Learning Objective:  Patient will have a greater understanding of diabetes self-management. Patient education plan is to attend individual and/or group sessions per assessed needs and concerns.   Plan:   Patient Instructions  Goals:  1. Cut out sodas. 2. Only eat 1 apple with meals and not 4. :) 3. Increase physical activity as tolerated. 4.  Cut out snacks between meals unless eating low carb vegetables. 5. Aim for monring blood sugar to 130 mg/dl or less and bedtime blood sugar between 100-150. 6. Get A1C down ot 7.5% in three months. 7. Eat carrots while watching tv if needed.   Expected Outcomes:  Demonstrated interest in learning. Expect positive outcomes  Education material provided: Meal plan card, My Plate and Carbohydrate counting sheet  If problems or questions, patient to contact team via:  Phone  Future DSME appointment: 3-4 months

## 2015-02-04 NOTE — Patient Instructions (Signed)
Goals:  1. Cut out sodas. 2. Only eat 1 apple with meals and not 4. :) 3. Increase physical activity as tolerated. 4.  Cut out snacks between meals unless eating low carb vegetables. 5. Aim for monring blood sugar to 130 mg/dl or less and bedtime blood sugar between 100-150. 6. Get A1C down ot 7.5% in three months. 7. Eat carrots while watching tv if needed.

## 2015-03-17 ENCOUNTER — Ambulatory Visit (INDEPENDENT_AMBULATORY_CARE_PROVIDER_SITE_OTHER): Payer: Medicare Other | Admitting: *Deleted

## 2015-03-17 DIAGNOSIS — I255 Ischemic cardiomyopathy: Secondary | ICD-10-CM | POA: Diagnosis not present

## 2015-03-17 DIAGNOSIS — E1159 Type 2 diabetes mellitus with other circulatory complications: Secondary | ICD-10-CM | POA: Diagnosis not present

## 2015-03-17 DIAGNOSIS — I10 Essential (primary) hypertension: Secondary | ICD-10-CM | POA: Diagnosis not present

## 2015-03-17 DIAGNOSIS — E782 Mixed hyperlipidemia: Secondary | ICD-10-CM | POA: Diagnosis not present

## 2015-03-17 LAB — HEMOGLOBIN A1C: Hgb A1c MFr Bld: 7.4 % — AB (ref 4.0–6.0)

## 2015-03-18 NOTE — Progress Notes (Signed)
Remote ICD transmission.   

## 2015-03-23 ENCOUNTER — Encounter: Payer: Self-pay | Admitting: Nutrition

## 2015-03-23 ENCOUNTER — Ambulatory Visit (INDEPENDENT_AMBULATORY_CARE_PROVIDER_SITE_OTHER): Payer: Medicare Other | Admitting: "Endocrinology

## 2015-03-23 ENCOUNTER — Encounter: Payer: Self-pay | Admitting: "Endocrinology

## 2015-03-23 ENCOUNTER — Encounter: Payer: Medicare Other | Attending: Internal Medicine | Admitting: Nutrition

## 2015-03-23 VITALS — BP 141/81 | HR 69 | Ht 72.0 in | Wt 188.0 lb

## 2015-03-23 VITALS — Ht 72.0 in | Wt 188.4 lb

## 2015-03-23 DIAGNOSIS — I10 Essential (primary) hypertension: Secondary | ICD-10-CM

## 2015-03-23 DIAGNOSIS — E785 Hyperlipidemia, unspecified: Secondary | ICD-10-CM

## 2015-03-23 DIAGNOSIS — Z713 Dietary counseling and surveillance: Secondary | ICD-10-CM | POA: Insufficient documentation

## 2015-03-23 DIAGNOSIS — E1159 Type 2 diabetes mellitus with other circulatory complications: Secondary | ICD-10-CM

## 2015-03-23 DIAGNOSIS — E118 Type 2 diabetes mellitus with unspecified complications: Secondary | ICD-10-CM | POA: Insufficient documentation

## 2015-03-23 DIAGNOSIS — Z794 Long term (current) use of insulin: Secondary | ICD-10-CM

## 2015-03-23 DIAGNOSIS — E1165 Type 2 diabetes mellitus with hyperglycemia: Secondary | ICD-10-CM

## 2015-03-23 DIAGNOSIS — I255 Ischemic cardiomyopathy: Secondary | ICD-10-CM

## 2015-03-23 DIAGNOSIS — IMO0002 Reserved for concepts with insufficient information to code with codable children: Secondary | ICD-10-CM

## 2015-03-23 NOTE — Progress Notes (Signed)
Subjective:    Patient ID: Jordan Ross, male    DOB: [birth year], PCP Glo Herring., MD   Past Medical History  Diagnosis Date  . Status post coronary artery bypass grafting   . Ischemic cardiomyopathy   . Abdominal aortic aneurysm ([place])   . Hypertension   . Hyperlipidemia   . Type 2 diabetes mellitus ([place])   . ICD (implantable cardioverter-defibrillator), single, in situ   . Coronary artery disease     AWMI 2001  . LV dysfunction    Past Surgical History  Procedure Laterality Date  . Colonoscopy N/A 03/21/2014    Procedure: COLONOSCOPY;  Surgeon: Rogene Houston, MD;  [facility];  Service: Endoscopy;  Laterality: N/A;  855 - moved to 12/4 @ 8:30 - Ann notified pt  . Doppler echocardiography      2011 38% EF.2013 EF 30-35%   Social History   Social History  . Marital Status: Widowed    Spouse Name: N/A  . Number of Children: N/A  . Years of Education: N/A   Social History Main Topics  . Smoking status: Former Smoker -- 26 years    Quit date: 11/13/1998  . Smokeless tobacco: Never Used  . Alcohol Use: Yes     Comment: occasional   . Drug Use: No  . Sexual Activity: Not Asked   Other Topics Concern  . None   Social History Narrative   Outpatient Encounter Prescriptions as of 03/23/2015  Medication Sig  . aspirin 81 MG tablet Take 81 mg by mouth daily.   [name] atorvastatin (LIPITOR) 40 MG tablet Take 40 mg by mouth daily.  . carvedilol (COREG) 12.5 MG tablet Take 12.5 mg by mouth 2 (two) times daily.  . digoxin (LANOXIN) 0.125 MG tablet Take 125 mcg by mouth daily.  . Insulin Glargine (LANTUS SOLOSTAR) 100 UNIT/ML Solostar Pen Inject 30 Units into the skin at bedtime.  . INVOKANA 300 MG TABS tablet   . IRON PO Take 65 mg by mouth daily.  . metFORMIN (GLUCOPHAGE) 500 MG tablet Take 1,000 mg by mouth 2 (two) times daily with a meal.   . niacin (NIASPAN) 500 MG CR tablet Take 500 mg by mouth daily.  . pantoprazole (PROTONIX) 40 MG tablet Take 40  mg by mouth daily.  . valsartan (DIOVAN) 160 MG tablet Take 160 mg by mouth daily.  . polyethylene glycol-electrolytes (NULYTELY/GOLYTELY) 420 G solution Take 4,000 mLs by mouth once.  [name] spironolactone (ALDACTONE) 25 MG tablet Take 25 mg by mouth daily.  . [DISCONTINUED] ferrous fumarate (HEMOCYTE - 106 MG FE) 325 (106 FE) MG TABS tablet Take 1 tablet by mouth.  . [DISCONTINUED] insulin detemir (LEVEMIR) 100 UNIT/ML injection Inject 30 Units into the skin at bedtime.   No facility-administered encounter medications on file as of 03/23/2015.   ALLERGIES: No Known Allergies VACCINATION STATUS:  There is no immunization history on file for this patient.  Diabetes He presents for his follow-up diabetic visit. He has type 2 diabetes mellitus. Onset time: He was diagnosed at approximate age of 57 years. His disease course has been improving. There are no hypoglycemic associated symptoms. There are no diabetic associated symptoms. There are no hypoglycemic complications. Symptoms are improving. Diabetic complications include heart disease. Risk factors for coronary artery disease include diabetes mellitus, dyslipidemia, hypertension, male sex, sedentary lifestyle and tobacco exposure. Current diabetic treatment includes insulin injections and oral agent (dual therapy). He is compliant with treatment most of the time.  His weight is stable. He is following a generally unhealthy diet. He has had a previous visit with a dietitian. His home blood glucose trend is decreasing steadily. His overall blood glucose range is 140-180 mg/dl. An ACE inhibitor/angiotensin II receptor blocker is being taken. Eye exam is not current.  Hyperlipidemia This is a chronic problem. The current episode started more than 1 year ago. Current antihyperlipidemic treatment includes statins.  Hypertension This is a chronic problem. The current episode started more than 1 year ago. Risk factors for coronary artery disease include  dyslipidemia. Past treatments include ACE inhibitors.     Review of Systems  Objective:    BP 141/81 mmHg  Pulse 69  Ht 6' (1.829 m)  Wt 188 lb (85.276 kg)  BMI 25.49 kg/m2  SpO2 98%  Wt Readings from Last 3 Encounters:  03/23/15 188 lb 6.4 oz (85.458 kg)  03/23/15 188 lb (85.276 kg)  01/02/15 189 lb 14.4 oz (86.138 kg)    Physical Exam  Results for orders placed or performed in visit on 03/23/15  Hemoglobin A1c  Result Value Ref Range   Hgb A1c MFr Bld 7.4 (A) 4.0 - 6.0 %   Complete Blood Count (Most recent): No results found for: WBC, HGB, HCT, MCV, PLT Chemistry (most recent): No results found for: NA, K, CL, CO2, BUN, CREATININE, GLUF Diabetic Labs (most recent): Lab Results  Component Value Date   HGBA1C 7.4* 03/17/2015      Assessment & Plan:   1. Type 2 diabetes mellitus with vascular disease (HCC)  - His diabetes is  complicated by coronary artery disease and patient remains at a high risk for more acute and chronic complications of diabetes which include CAD, CVA, CKD, retinopathy, and neuropathy. These are all discussed in detail with the patient.  Patient came with improved glucose profile, and  recent A1c of 7.4 %.  Glucose logs and insulin administration records pertaining to this visit,  to be scanned into patient's records.  Recent labs reviewed.   - I have re-counseled the patient on diet management and weight loss  by adopting a carbohydrate restricted / protein rich  Diet.  - Suggestion is made for patient to avoid simple carbohydrates   from their diet including Cakes , Desserts, Ice Cream,  Soda (  diet and regular) , Sweet Tea , Candies,  Chips, Cookies, Artificial Sweeteners,   and "Sugar-free" Products .  This will help patient to have stable blood glucose profile and potentially avoid unintended  Weight gain.  - Patient is advised to stick to a routine mealtimes to eat 3 meals  a day and avoid unnecessary snacks ( to snack only to correct  hypoglycemia).  - The patient  will be  scheduled with Jearld Fenton, RDN, CDE for individualized DM education.  - I have approached patient with the following individualized plan to manage diabetes and patient agrees.   -He will continue MTF 1gm po BID and Invokana 300 mg po qday. I will continue  Lantus to 30 units qhs associated with monitoring BG BID.  - Patient will be considered for incretin therapy as appropriate next visit. - Patient specific target  for A1c; LDL, HDL, Triglycerides, and  Waist Circumference were discussed in detail.  2) BP/HTN:  Controlled. Continue current medications including ACEI/ARB. 3) Lipids/HPL:  continue statins. 4)  Weight/Diet: CDE consult in progress, exercise, and carbohydrates information provided.  5) Chronic Care/Health Maintenance:  -Patient  is on ACEI/ARB and Statin medications and  encouraged to continue to follow up with Ophthalmology, Podiatrist at least yearly or according to recommendations, and advised to  stay away from smoking. I have recommended yearly flu vaccine and pneumonia vaccination at least every 5 years; moderate intensity exercise for up to 150 minutes weekly; and  sleep for at least 7 hours a day.  - 25 minutes of time was spent on the care of this patient , 50% of which was applied for counseling on diabetes complications and their preventions.  - I advised patient to maintain close follow up with Glo Herring., MD for primary care needs.  Patient is asked to bring meter and  blood glucose logs during their next visit.   Follow up plan: -Return in about 3 months (around 06/21/2015) for diabetes, high blood pressure, high cholesterol, follow up with pre-visit labs, meter, and logs.  Glade Lloyd, MD Phone: [phone]  Fax: [phone]   03/23/2015, 8:20 PM

## 2015-03-23 NOTE — Patient Instructions (Signed)

## 2015-03-25 NOTE — Progress Notes (Signed)
Diabetes Self-Management Education  Visit Type:  Follow-up  Appt. Start Time: 1530 Appt. End Time: 1430  03/25/2015  [name], identified by name and date of birth, is a 69 y.o. male with a diagnosis of Diabetes:  . He is here with his daughter. He has managed to cut out all sodas, drinking water, cut out snacks and has done very well. Feels much better. Lab Results  Component Value Date   HGBA1C 7.4* 03/17/2015     ASSESSMENT  Height 6' (1.829 m), weight 188 lb 6.4 oz (85.458 kg). Body mass index is 25.55 kg/(m^2).       Diabetes Self-Management Education - 03/23/15 1539    Psychosocial Assessment   Patient Belief/Attitude about Diabetes Motivated to manage diabetes   Self-care barriers None   Learning Readiness Change in progress   Complications   Last HgB A1C per patient/outside source 7.4 %   How often do you check your blood sugar? 3-4 times/day   Fasting Blood glucose range (mg/dL) 70-129   Postprandial Blood glucose range (mg/dL) 130-179   Number of hypoglycemic episodes per month 0   Number of hyperglycemic episodes per week 0   Can you tell when your blood sugar is high? Yes   Are you checking your feet? Yes   How many days per week are you checking your feet? 7   Dietary Intake   Breakfast egg sandwich and 1 slice bacon or sauage.   Lunch Toss salad with no meat, ham and with [place] or french, 2 wafers.   Dinner 1 cup pintos, 1/2 corn, 2 bicsuits,potatoes, water.   Beverage(s) water   Exercise   Exercise Type Light (walking / raking leaves)   How many days per week to you exercise? 7   How many minutes per day do you exercise? 0   Total minutes per week of exercise 0   Patient Education   Nutrition management  Carbohydrate counting;Reviewed blood glucose goals for pre and post meals and how to evaluate the patients' food intake on their blood glucose level.;Meal options for control of blood glucose level and chronic complications.   Physical activity  and exercise  Role of exercise on diabetes management, blood pressure control and cardiac health.   Monitoring Identified appropriate SMBG and/or A1C goals.   Chronic complications Relationship between chronic complications and blood glucose control;Identified and discussed with patient  current chronic complications   Personal strategies to promote health Lifestyle issues that need to be addressed for better diabetes care   Individualized Goals (developed by patient)   Nutrition Follow meal plan discussed;General guidelines for healthy choices and portions discussed   Physical Activity Exercise 3-5 times per week   Monitoring  test my blood glucose as discussed   Reducing Risk examine blood glucose patterns   Patient Self-Evaluation of Goals - Patient rates self as meeting previously set goals (% of time)   Nutrition >75%   Physical Activity >75%   Medications >75%   Monitoring >75%   Problem Solving >75%   Reducing Risk >75%   Health Coping >75%   Outcomes   Program Status Completed   Subsequent Visit   Since your last visit have you continued or begun to take your medications as prescribed? Yes   Since your last visit have you had your blood pressure checked? Yes   Is your most recent blood pressure lower, unchanged, or higher since your last visit? Lower   Since your last visit have you experienced  any weight changes? Loss   Weight Loss (lbs) 2   Since your last visit, are you checking your blood glucose at least once a day? Yes      Learning Objective:  Patient will have a greater understanding of diabetes self-management. Patient education plan is to attend individual and/or group sessions per assessed needs and concerns.   Plan:  Goals; 1. Keep up the good work!! 2. Increase physical activity 45 minutes daily as tolerated. 3. Read food labels. 4. Increase fiber rich foods in diet as tolerated. 5. Get A1C down to 7% in three months. 6. Measure foods out for portion  control.   Expected Outcomes:  Demonstrated interest in learning. Expect positive outcomes  Education material provided: Meal plan card, My Plate and Carbohydrate counting sheet  If problems or questions, patient to contact team via:  Phone  Future DSME appointment: - 3-4 months

## 2015-03-25 NOTE — Patient Instructions (Signed)
Goals; 1. Keep up the good work!! 2. Increase physical activity 45 minutes daily as tolerated. 3. Read food labels. 4. Increase fiber rich foods in diet as tolerated. 5. Get A1C down to 7% in three months. 6. Measure foods out for portion control.

## 2015-03-26 LAB — CUP PACEART REMOTE DEVICE CHECK
Battery Remaining Longevity: 64 mo
Battery Remaining Percentage: 53 %
Brady Statistic RV Percent Paced: 1 %
HighPow Impedance: 44 Ohm
Implantable Lead Implant Date: 20101028
Implantable Lead Location: 753860
Lead Channel Impedance Value: 440 Ohm
Lead Channel Pacing Threshold Pulse Width: 0.4 ms
Lead Channel Setting Pacing Pulse Width: 0.4 ms
MDC IDC MSMT BATTERY VOLTAGE: 2.93 V
MDC IDC MSMT LEADCHNL RV PACING THRESHOLD AMPLITUDE: 0.5 V
MDC IDC MSMT LEADCHNL RV SENSING INTR AMPL: 11.9 mV
MDC IDC SESS DTM: 20161129070336
MDC IDC SET LEADCHNL RV PACING AMPLITUDE: 2.5 V
MDC IDC SET LEADCHNL RV SENSING SENSITIVITY: 0.5 mV
Pulse Gen Serial Number: 737526

## 2015-03-27 ENCOUNTER — Encounter: Payer: Self-pay | Admitting: Cardiology

## 2015-04-01 ENCOUNTER — Ambulatory Visit (INDEPENDENT_AMBULATORY_CARE_PROVIDER_SITE_OTHER): Payer: Medicare Other | Admitting: Cardiovascular Disease

## 2015-04-01 ENCOUNTER — Encounter: Payer: Self-pay | Admitting: Cardiovascular Disease

## 2015-04-01 VITALS — BP 122/86 | HR 64 | Ht 72.0 in | Wt 189.7 lb

## 2015-04-01 DIAGNOSIS — I701 Atherosclerosis of renal artery: Secondary | ICD-10-CM

## 2015-04-01 DIAGNOSIS — I10 Essential (primary) hypertension: Secondary | ICD-10-CM

## 2015-04-01 DIAGNOSIS — I2510 Atherosclerotic heart disease of native coronary artery without angina pectoris: Secondary | ICD-10-CM | POA: Diagnosis not present

## 2015-04-01 DIAGNOSIS — I714 Abdominal aortic aneurysm, without rupture, unspecified: Secondary | ICD-10-CM

## 2015-04-01 DIAGNOSIS — I255 Ischemic cardiomyopathy: Secondary | ICD-10-CM

## 2015-04-01 DIAGNOSIS — I2583 Coronary atherosclerosis due to lipid rich plaque: Secondary | ICD-10-CM

## 2015-04-01 NOTE — Assessment & Plan Note (Signed)
History of hypertension blood pressure measures at 122/86. He is on carvedilol, spironolactone and valsartan. Continue current meds at current dosing

## 2015-04-01 NOTE — Assessment & Plan Note (Signed)
History of ICD implanted by Dr. Rollene Fare October 2010 for primary prevention followed by Dr. Sallyanne Kuster.

## 2015-04-01 NOTE — Progress Notes (Signed)
04/01/2015 Jordan Ross   Jan 31, 1946  [id number]  Primary Physician Glo Herring., MD Primary Cardiologist: Lorretta Harp MD Renae Gloss   HPI:   Jordan Ross is a 69 year old mildly overweight widowed Caucasian male father of one child who is retired from working at [employer] and [employer]. He is formally a patient of Dr. Terance Ice I last saw him 05/19/14. He is a long complicated past medical history notable for coronary artery disease status post anterior wall myocardial infarction in 2001 treated with coronary artery bypass grafting by Dr. Roxy Manns September 2001. His LIMA to his LAD, vein graft to a ramus branch, circumflex and PDA. His EF was in the 35% range.essentially underwent ICD implantation for primary prevention October 2000 which was followed by Dr. Rollene Fare. His other problems include a known abdominal aortic aneurysm measuring 5 cm following the ultrasound semiannually. He has a history of hypertension, hyperlipidemia and non-insulin-requiring diabetes. He has chronic shortness of breath is likely related to a history of tobacco abuse having smoked 40-pack-years and quit back in 2001 at the time of his open heart surgery but denies chest pain. The stress Myoview performed 04/07/11 showed anterior scar without ischemia. Since I saw him one year ago he has remained asymptomatic. Dr. Sallyanne Kuster follows him for his ICD.   Current Outpatient Prescriptions  Medication Sig Dispense Refill  . aspirin 81 MG tablet Take 81 mg by mouth daily.     [name] atorvastatin (LIPITOR) 40 MG tablet Take 40 mg by mouth daily.    . carvedilol (COREG) 12.5 MG tablet Take 12.5 mg by mouth 2 (two) times daily.    . digoxin (LANOXIN) 0.125 MG tablet Take 125 mcg by mouth daily.    [name] GLIPIZIDE XL 10 MG 24 hr tablet Take 1 tablet by mouth daily.    . Insulin Glargine (LANTUS SOLOSTAR) 100 UNIT/ML Solostar Pen Inject 30 Units into the skin at bedtime.    . INVOKANA 300 MG TABS  tablet     . IRON PO Take 65 mg by mouth daily.    . metFORMIN (GLUCOPHAGE) 500 MG tablet Take 1,000 mg by mouth 2 (two) times daily with a meal.     . niacin (NIASPAN) 500 MG CR tablet Take 500 mg by mouth daily.    . pantoprazole (PROTONIX) 40 MG tablet Take 40 mg by mouth daily.    . polyethylene glycol-electrolytes (NULYTELY/GOLYTELY) 420 G solution Take 4,000 mLs by mouth once. 4000 mL 0  . spironolactone (ALDACTONE) 25 MG tablet Take 25 mg by mouth daily.    . valsartan (DIOVAN) 160 MG tablet Take 160 mg by mouth daily.     No current facility-administered medications for this visit.    No Known Allergies  Social History   Social History  . Marital Status: Widowed    Spouse Name: N/A  . Number of Children: N/A  . Years of Education: N/A   Occupational History  . Not on file.   Social History Main Topics  . Smoking status: Former Smoker -- 21 years    Quit date: 11/13/1998  . Smokeless tobacco: Never Used  . Alcohol Use: Yes     Comment: occasional   . Drug Use: No  . Sexual Activity: Not on file   Other Topics Concern  . Not on file   Social History Narrative     Review of Systems: General: negative for chills, fever, night sweats or weight changes.  Cardiovascular: negative for  chest pain, dyspnea on exertion, edema, orthopnea, palpitations, paroxysmal nocturnal dyspnea or shortness of breath Dermatological: negative for rash Respiratory: negative for cough or wheezing Urologic: negative for hematuria Abdominal: negative for nausea, vomiting, diarrhea, bright red blood per rectum, melena, or hematemesis Neurologic: negative for visual changes, syncope, or dizziness All other systems reviewed and are otherwise negative except as noted above.    Blood pressure 122/86, pulse 64, height 6' (1.829 m), weight 189 lb 11.2 oz (86.047 kg).  General appearance: alert and no distress Neck: no adenopathy, no carotid bruit, no JVD, supple, symmetrical, trachea midline  and thyroid not enlarged, symmetric, no tenderness/mass/nodules Lungs: clear to auscultation bilaterally Heart: regular rate and rhythm, S1, S2 normal, no murmur, click, rub or gallop Extremities: extremities normal, atraumatic, no cyanosis or edema  EKG not performed today  ASSESSMENT AND PLAN:   ICD (implantable cardioverter-defibrillator), single, in situ History of ICD implanted by Dr. Rollene Fare October 2010 for primary prevention followed by Dr. Sallyanne Kuster.  Hyperlipidemia History of hyperlipidemia on statin therapy followed by his PCP  Essential hypertension History of hypertension blood pressure measures at 122/86. He is on carvedilol, spironolactone and valsartan. Continue current meds at current dosing  Coronary artery disease History of coronary artery disease status post anterior wall myocardial infarction back in 2001 with subsequent coronary artery has grafting by Dr. Roxy Manns September 2001. LIMA to his LAD, I think graft to the ramus branch, circumflex and PDA. His EF is in the 35%. He has ischemic cardiomyopathy.he denies chest pain or shortness of breath.  Bilateral renal artery stenosis History of bilateral renal artery stenosis followed by renal duplex last performed 11/27/14 revealing patent renal arteries.  Abdominal aortic aneurysm History of a moderate size abdominal aortic Aneurysm in the 5 cm range last checked 04/07/14. We will recheck abdominal ultrasound      Lorretta Harp MD [facility], [facility] 04/01/2015 4:29 PM

## 2015-04-01 NOTE — Assessment & Plan Note (Signed)
History of hyperlipidemia on statin therapy followed by his PCP 

## 2015-04-01 NOTE — Assessment & Plan Note (Signed)
History of coronary artery disease status post anterior wall myocardial infarction back in 2001 with subsequent coronary artery has grafting by Dr. Roxy Manns September 2001. LIMA to his LAD, I think graft to the ramus branch, circumflex and PDA. His EF is in the 35%. He has ischemic cardiomyopathy.he denies chest pain or shortness of breath.

## 2015-04-01 NOTE — Assessment & Plan Note (Signed)
History of bilateral renal artery stenosis followed by renal duplex last performed 11/27/14 revealing patent renal arteries.

## 2015-04-01 NOTE — Patient Instructions (Signed)
Medication Instructions:  Your physician recommends that you continue on your current medications as directed. Please refer to the Current Medication list given to you today.   Labwork: I will get your lab work from your Dr. Liliane Channel office   Testing/Procedures: Your physician has requested that you have an abdominal aorta duplex. During this test, an ultrasound is used to evaluate the aorta. Allow 30 minutes for this exam. Do not eat after midnight the day before and avoid carbonated beverages   Follow-Up: Your physician wants you to follow-up in: 12 months with Dr. Gwenlyn Found. You will receive a reminder letter in the mail two months in advance. If you don't receive a letter, please call our office to schedule the follow-up appointment.   Any Other Special Instructions Will Be Listed Below (If Applicable).     If you need a refill on your cardiac medications before your next appointment, please call your pharmacy.

## 2015-04-01 NOTE — Assessment & Plan Note (Signed)
History of a moderate size abdominal aortic Aneurysm in the 5 cm range last checked 04/07/14. We will recheck abdominal ultrasound

## 2015-04-06 DIAGNOSIS — Z6826 Body mass index (BMI) 26.0-26.9, adult: Secondary | ICD-10-CM | POA: Diagnosis not present

## 2015-04-06 DIAGNOSIS — E782 Mixed hyperlipidemia: Secondary | ICD-10-CM | POA: Diagnosis not present

## 2015-04-06 DIAGNOSIS — E119 Type 2 diabetes mellitus without complications: Secondary | ICD-10-CM | POA: Diagnosis not present

## 2015-04-06 DIAGNOSIS — K219 Gastro-esophageal reflux disease without esophagitis: Secondary | ICD-10-CM | POA: Diagnosis not present

## 2015-04-06 DIAGNOSIS — Z23 Encounter for immunization: Secondary | ICD-10-CM | POA: Diagnosis not present

## 2015-04-06 DIAGNOSIS — Z1389 Encounter for screening for other disorder: Secondary | ICD-10-CM | POA: Diagnosis not present

## 2015-04-07 ENCOUNTER — Ambulatory Visit (HOSPITAL_COMMUNITY)
Admission: RE | Admit: 2015-04-07 | Discharge: 2015-04-07 | Disposition: A | Discharge status: Home / Self Care | Payer: Medicare Other | Source: Ambulatory Visit | Attending: Cardiology | Admitting: Cardiology

## 2015-04-07 DIAGNOSIS — I700 Atherosclerosis of aorta: Secondary | ICD-10-CM | POA: Insufficient documentation

## 2015-04-07 DIAGNOSIS — I10 Essential (primary) hypertension: Secondary | ICD-10-CM | POA: Diagnosis not present

## 2015-04-07 DIAGNOSIS — E119 Type 2 diabetes mellitus without complications: Secondary | ICD-10-CM | POA: Diagnosis not present

## 2015-04-07 DIAGNOSIS — I708 Atherosclerosis of other arteries: Secondary | ICD-10-CM | POA: Insufficient documentation

## 2015-04-07 DIAGNOSIS — E785 Hyperlipidemia, unspecified: Secondary | ICD-10-CM | POA: Diagnosis not present

## 2015-04-07 DIAGNOSIS — I714 Abdominal aortic aneurysm, without rupture, unspecified: Secondary | ICD-10-CM

## 2015-04-10 ENCOUNTER — Other Ambulatory Visit: Payer: Self-pay

## 2015-04-10 DIAGNOSIS — I714 Abdominal aortic aneurysm, without rupture, unspecified: Secondary | ICD-10-CM

## 2015-04-24 ENCOUNTER — Encounter: Payer: Self-pay | Admitting: Surgery

## 2015-05-04 ENCOUNTER — Encounter: Payer: Self-pay | Admitting: Surgery

## 2015-05-04 ENCOUNTER — Ambulatory Visit (INDEPENDENT_AMBULATORY_CARE_PROVIDER_SITE_OTHER): Payer: Medicare Other | Admitting: Surgery

## 2015-05-04 VITALS — BP 147/80 | HR 54 | Temp 97.7°F | Resp 16 | Ht 72.0 in | Wt 195.0 lb

## 2015-05-04 DIAGNOSIS — I716 Thoracoabdominal aortic aneurysm, without rupture, unspecified: Secondary | ICD-10-CM

## 2015-05-04 DIAGNOSIS — Z01810 Encounter for preprocedural cardiovascular examination: Secondary | ICD-10-CM

## 2015-05-04 NOTE — Progress Notes (Signed)
Filed Vitals:   05/04/15 0943 05/04/15 0952  BP: 151/76 147/80  Pulse: 55 54  Temp: 97.7 F (36.5 C)   TempSrc: Oral   Resp: 16   Height: 6' (1.829 m)   Weight: 195 lb (88.451 kg)   SpO2: 99%

## 2015-05-04 NOTE — Progress Notes (Signed)
Patient name: Jordan Ross MRN: [id number] DOB: 12/05/1945 Sex: male   Referred by: Dr. Gwenlyn Found  Reason for referral:  Chief Complaint  Patient presents with  . New Evaluation    Ref. by Dr. Gwenlyn Found  C/O  AAA  5.6  U/S at [place]: This is a very pleasant 70 year old gentleman who was referred to me for evaluation of an abdominal aortic aneurysm.  His most recent ultrasound was in December 2015 which showed a 5.0 cm aneurysm.  This was recently repeated and it is now grown to 5.6 cm.  He denies any symptoms.  He does not have any back pain or abdominal pain.  The patient suffers coronary artery disease and is status post CABG in 2001.  He is medically managed for hypercholesterolemia with a statin.  His blood pressure is controlled with an ARB.  He suffers from diabetes which is poorly controlled.  His hemoglobin A1c from 03/17/2015 was 7.4.  He has a history of bilateral renal artery stenosis.  He has a Data processing manager in place.  Past Medical History  Diagnosis Date  . Status post coronary artery bypass grafting   . Ischemic cardiomyopathy   . Abdominal aortic aneurysm ([place])   . Hypertension   . Hyperlipidemia   . Type 2 diabetes mellitus ([place])   . ICD (implantable cardioverter-defibrillator), single, in situ   . Coronary artery disease     AWMI 2001  . LV dysfunction   . CHF (congestive heart failure) ([place])   . Myocardial infarction [facility]) Sept. 25, 2001    Heart Attack    Past Surgical History  Procedure Laterality Date  . Colonoscopy N/A 03/21/2014    Procedure: COLONOSCOPY;  Surgeon: Rogene Houston, MD;  Location: [facility];  Service: Endoscopy;  Laterality: N/A;  855 - moved to 12/4 @ 8:30 - Ann notified pt  . Doppler echocardiography      2011 38% EF.2013 EF 30-35%    Social History   Social History  . Marital Status: Widowed    Spouse Name: N/A  . Number of Children: N/A  . Years of Education: N/A   Occupational  History  . Not on file.   Social History Main Topics  . Smoking status: Former Smoker -- 50 years    Quit date: 11/13/1998  . Smokeless tobacco: Never Used  . Alcohol Use: Yes     Comment: occasional   . Drug Use: No  . Sexual Activity: Not on file   Other Topics Concern  . Not on file   Social History Narrative    History reviewed. No pertinent family history.  Allergies as of 05/04/2015  . (No Known Allergies)    Current Outpatient Prescriptions on File Prior to Visit  Medication Sig Dispense Refill  . aspirin 81 MG tablet Take 81 mg by mouth daily.     [name] atorvastatin (LIPITOR) 40 MG tablet Take 40 mg by mouth daily.    . carvedilol (COREG) 12.5 MG tablet Take 12.5 mg by mouth 2 (two) times daily.    . digoxin (LANOXIN) 0.125 MG tablet Take 125 mcg by mouth daily.    . Insulin Glargine (LANTUS SOLOSTAR) 100 UNIT/ML Solostar Pen Inject 30 Units into the skin at bedtime.    . INVOKANA 300 MG TABS tablet     . IRON PO Take 65 mg by mouth daily.    . metFORMIN (GLUCOPHAGE) 500 MG tablet  Take 1,000 mg by mouth 2 (two) times daily with a meal.     . niacin (NIASPAN) 500 MG CR tablet Take 500 mg by mouth daily.    . pantoprazole (PROTONIX) 40 MG tablet Take 40 mg by mouth daily.    [name] spironolactone (ALDACTONE) 25 MG tablet Take 25 mg by mouth daily.    . valsartan (DIOVAN) 160 MG tablet Take 160 mg by mouth daily.    [name] GLIPIZIDE XL 10 MG 24 hr tablet Take 1 tablet by mouth daily. Reported on 05/04/2015    . polyethylene glycol-electrolytes (NULYTELY/GOLYTELY) 420 G solution Take 4,000 mLs by mouth once. (Patient not taking: Reported on 05/04/2015) 4000 mL 0   No current facility-administered medications on file prior to visit.     REVIEW OF SYSTEMS: Cardiovascular: No chest pain, chest pressure, palpitations, orthopnea, or dyspnea on exertion. No claudication or rest pain,  No history of DVT or phlebitis. Pulmonary: No productive cough, asthma or wheezing. Neurologic: No  weakness, paresthesias, aphasia, or amaurosis. No dizziness. Hematologic: No bleeding problems or clotting disorders. Musculoskeletal: No joint pain or joint swelling. Gastrointestinal: No blood in stool or hematemesis Genitourinary: No dysuria or hematuria. Psychiatric:: No history of major depression. Integumentary: No rashes or ulcers. Constitutional: No fever or chills.  PHYSICAL EXAMINATION:  Filed Vitals:   05/04/15 0943 05/04/15 0952  BP: 151/76 147/80  Pulse: 55 54  Temp: 97.7 F (36.5 C)   TempSrc: Oral   Resp: 16   Height: 6' (1.829 m)   Weight: 195 lb (88.451 kg)   SpO2: 99%    Body mass index is 26.44 kg/(m^2). General: The patient appears their stated age.   HEENT:  No gross abnormalities Pulmonary: Respirations are non-labored Abdomen: Soft and non-tender .  Nontender pulsatile mass Musculoskeletal: There are no major deformities.   Neurologic: No focal weakness or paresthesias are detected, Skin: There are no ulcer or rashes noted. Psychiatric: The patient has normal affect. Cardiovascular: There is a regular rate and rhythm without significant murmur appreciated.  Palpable pedal pulses.  No carotid bruit.  Diagnostic Studies: I have reviewed his outside ultrasound which shows a 5.6 cm infrarenal abdominal aortic aneurysm  He has carotid Doppler studies from 2 years ago which were within normal limits  Assessment:  Abdominal aortic aneurysm Plan: I discussed with the patient that I need to get a CT angiogram of the chest abdomen and pelvis performed a siding on the most appropriate surgical option.  I will going to have this done this week and have him follow-up on Monday     V. Leia Alf, M.D. [facility] [place] Office: [phone] Pager:  [phone]

## 2015-05-07 ENCOUNTER — Other Ambulatory Visit: Payer: Self-pay | Admitting: Surgery

## 2015-05-07 DIAGNOSIS — Z01810 Encounter for preprocedural cardiovascular examination: Secondary | ICD-10-CM | POA: Diagnosis not present

## 2015-05-07 DIAGNOSIS — I716 Thoracoabdominal aortic aneurysm, without rupture: Secondary | ICD-10-CM | POA: Diagnosis not present

## 2015-05-07 LAB — CREATININE, SERUM: CREATININE: 0.9 mg/dL (ref 0.70–1.25)

## 2015-05-08 ENCOUNTER — Encounter: Payer: Self-pay | Admitting: Surgery

## 2015-05-11 ENCOUNTER — Encounter: Payer: Self-pay | Admitting: Surgery

## 2015-05-11 ENCOUNTER — Ambulatory Visit
Admission: RE | Admit: 2015-05-11 | Discharge: 2015-05-11 | Disposition: A | Discharge status: Home / Self Care | Payer: Medicare Other | Source: Ambulatory Visit | Attending: Surgery | Admitting: Surgery

## 2015-05-11 ENCOUNTER — Ambulatory Visit (INDEPENDENT_AMBULATORY_CARE_PROVIDER_SITE_OTHER): Payer: Medicare Other | Admitting: Surgery

## 2015-05-11 VITALS — BP 119/75 | HR 62 | Ht 72.0 in | Wt 192.8 lb

## 2015-05-11 DIAGNOSIS — Z01810 Encounter for preprocedural cardiovascular examination: Secondary | ICD-10-CM

## 2015-05-11 DIAGNOSIS — I716 Thoracoabdominal aortic aneurysm, without rupture, unspecified: Secondary | ICD-10-CM

## 2015-05-11 DIAGNOSIS — I714 Abdominal aortic aneurysm, without rupture, unspecified: Secondary | ICD-10-CM

## 2015-05-11 DIAGNOSIS — I712 Thoracic aortic aneurysm, without rupture: Secondary | ICD-10-CM | POA: Diagnosis not present

## 2015-05-11 MED ORDER — IOPAMIDOL (ISOVUE-370) INJECTION 76%
75.0000 mL | Freq: Once | INTRAVENOUS | Status: AC | PRN
  Administered 2015-05-11: 75 mL via INTRAVENOUS

## 2015-05-11 NOTE — Progress Notes (Signed)
Patient name: Jordan Ross MRN: [id number] DOB: 10-Mar-1946 Sex: male     Chief Complaint  Patient presents with  . Re-evaluation    1 wk f/u - CTA abd/pelvis/chest prior    HISTORY OF PRESENT ILLNESS:  The patient is back after having had CT scan to evaluate an abdominal aortic aneurysm.   He initially presented with an ultrasound which revealed a 5.6 and meter aneurysm. He is asymptomatic.  The patient suffers coronary artery disease and is status post CABG in 2001. He is medically managed for hypercholesterolemia with a statin. His blood pressure is controlled with an ARB. He suffers from diabetes which is poorly controlled. His hemoglobin A1c from 03/17/2015 was 7.4. He has a history of bilateral renal artery stenosis. He has a Data processing manager in place.  Past Medical History  Diagnosis Date  . Status post coronary artery bypass grafting   . Ischemic cardiomyopathy   . Abdominal aortic aneurysm ([place])   . Hypertension   . Hyperlipidemia   . Type 2 diabetes mellitus ([place])   . ICD (implantable cardioverter-defibrillator), single, in situ   . Coronary artery disease     AWMI 2001  . LV dysfunction   . CHF (congestive heart failure) ([place])   . Myocardial infarction [facility]) Sept. 25, 2001    Heart Attack    Past Surgical History  Procedure Laterality Date  . Colonoscopy N/A 03/21/2014    Procedure: COLONOSCOPY;  Surgeon: Rogene Houston, MD;  Location[facility];  Service: Endoscopy;  Laterality: N/A;  855 - moved to 12/4 @ 8:30 - Ann notified pt  . Doppler echocardiography      2011 38% EF.2013 EF 30-35%    Social History   Social History  . Marital Status: Widowed    Spouse Name: N/A  . Number of Children: N/A  . Years of Education: N/A   Occupational History  . Not on file.   Social History Main Topics  . Smoking status: Former Smoker -- 90 years    Quit date: 11/13/1998  . Smokeless tobacco: Never Used  . Alcohol Use: Yes     Comment:  occasional   . Drug Use: No  . Sexual Activity: Not on file   Other Topics Concern  . Not on file   Social History Narrative    Family History  Problem Relation Age of Onset  . COPD Mother   . Heart disease Brother     Allergies as of 05/11/2015  . (No Known Allergies)    Current Outpatient Prescriptions on File Prior to Visit  Medication Sig Dispense Refill  . aspirin 81 MG tablet Take 81 mg by mouth daily.     [name] atorvastatin (LIPITOR) 40 MG tablet Take 40 mg by mouth daily.    . carvedilol (COREG) 12.5 MG tablet Take 12.5 mg by mouth 2 (two) times daily.    . digoxin (LANOXIN) 0.125 MG tablet Take 125 mcg by mouth daily.    [name] GLIPIZIDE XL 10 MG 24 hr tablet Take 1 tablet by mouth daily. Reported on 05/04/2015    . Insulin Glargine (LANTUS SOLOSTAR) 100 UNIT/ML Solostar Pen Inject 30 Units into the skin at bedtime.    . INVOKANA 300 MG TABS tablet     . IRON PO Take 65 mg by mouth daily.    . metFORMIN (GLUCOPHAGE) 500 MG tablet Take 1,000 mg by mouth 2 (two) times daily with a meal.     .  niacin (NIASPAN) 500 MG CR tablet Take 500 mg by mouth daily.    . pantoprazole (PROTONIX) 40 MG tablet Take 40 mg by mouth daily.    . polyethylene glycol-electrolytes (NULYTELY/GOLYTELY) 420 G solution Take 4,000 mLs by mouth once. 4000 mL 0  . spironolactone (ALDACTONE) 25 MG tablet Take 25 mg by mouth daily.    . valsartan (DIOVAN) 160 MG tablet Take 160 mg by mouth daily.     No current facility-administered medications on file prior to visit.     REVIEW OF SYSTEMS:  no change from visit 1 week ago  PHYSICAL EXAMINATION:   Vital signs are  Filed Vitals:   05/11/15 1141  BP: 119/75  Pulse: 62  Height: 6' (1.829 m)  Weight: 192 lb 12.8 oz (87.454 kg)  SpO2: 98%   Body mass index is 26.14 kg/(m^2). General: The patient appears their stated age. HEENT:  No gross abnormalities Pulmonary:  Non labored breathing Abdomen: Soft and non-tender Musculoskeletal: There are no  major deformities. Neurologic: No focal weakness or paresthesias are detected, Skin: There are no ulcer or rashes noted. Psychiatric: The patient has normal affect.  palpable pedal pulses   Diagnostic Studies  I have reviewed CT angiogram which reveals a 5 cm infrarenal abdominal aortic aneurysm  Assessment:   abdominal aortic aneurysm Plan:  I discussed with the patient that I feel he is a candidate for endovascular repair.  We discussed the details of the procedure which included illustrations.  We discussed the risks and benefits of the procedure which include but are not limited to death, stroke, cardiac point complications, intestinal ischemia, renal ischemia and lower extremity ischemia.  He understands these and wishes to proceed.  His operation has been scheduled for Friday, February 3  V. Leia Alf, M.D. [facility] [place] Office: [phone] Pager:  [phone]

## 2015-05-13 ENCOUNTER — Telehealth: Payer: Self-pay

## 2015-05-13 ENCOUNTER — Other Ambulatory Visit: Payer: Self-pay

## 2015-05-13 NOTE — Telephone Encounter (Signed)
Error erroneous encounter

## 2015-05-18 ENCOUNTER — Encounter (HOSPITAL_COMMUNITY): Payer: Self-pay

## 2015-05-18 ENCOUNTER — Encounter (HOSPITAL_COMMUNITY)
Admission: RE | Admit: 2015-05-18 | Discharge: 2015-05-18 | Disposition: A | Discharge status: Home / Self Care | Payer: Medicare Other | Source: Ambulatory Visit | Attending: Surgery | Admitting: Surgery

## 2015-05-18 DIAGNOSIS — I2510 Atherosclerotic heart disease of native coronary artery without angina pectoris: Secondary | ICD-10-CM | POA: Insufficient documentation

## 2015-05-18 DIAGNOSIS — Z01818 Encounter for other preprocedural examination: Secondary | ICD-10-CM | POA: Insufficient documentation

## 2015-05-18 DIAGNOSIS — Z79899 Other long term (current) drug therapy: Secondary | ICD-10-CM | POA: Insufficient documentation

## 2015-05-18 DIAGNOSIS — E119 Type 2 diabetes mellitus without complications: Secondary | ICD-10-CM | POA: Diagnosis not present

## 2015-05-18 DIAGNOSIS — I255 Ischemic cardiomyopathy: Secondary | ICD-10-CM | POA: Insufficient documentation

## 2015-05-18 DIAGNOSIS — Z87891 Personal history of nicotine dependence: Secondary | ICD-10-CM | POA: Diagnosis not present

## 2015-05-18 DIAGNOSIS — I5022 Chronic systolic (congestive) heart failure: Secondary | ICD-10-CM | POA: Insufficient documentation

## 2015-05-18 DIAGNOSIS — Z794 Long term (current) use of insulin: Secondary | ICD-10-CM | POA: Insufficient documentation

## 2015-05-18 DIAGNOSIS — I714 Abdominal aortic aneurysm, without rupture: Secondary | ICD-10-CM | POA: Diagnosis not present

## 2015-05-18 DIAGNOSIS — Z95810 Presence of automatic (implantable) cardiac defibrillator: Secondary | ICD-10-CM | POA: Insufficient documentation

## 2015-05-18 DIAGNOSIS — Z955 Presence of coronary angioplasty implant and graft: Secondary | ICD-10-CM | POA: Insufficient documentation

## 2015-05-18 DIAGNOSIS — I252 Old myocardial infarction: Secondary | ICD-10-CM | POA: Diagnosis not present

## 2015-05-18 DIAGNOSIS — Z7982 Long term (current) use of aspirin: Secondary | ICD-10-CM | POA: Insufficient documentation

## 2015-05-18 DIAGNOSIS — Z0183 Encounter for blood typing: Secondary | ICD-10-CM | POA: Diagnosis not present

## 2015-05-18 DIAGNOSIS — Z01812 Encounter for preprocedural laboratory examination: Secondary | ICD-10-CM | POA: Diagnosis not present

## 2015-05-18 HISTORY — DX: Personal history of colonic polyps: Z86.010

## 2015-05-18 HISTORY — DX: Anemia, unspecified: D64.9

## 2015-05-18 HISTORY — DX: Personal history of other diseases of the nervous system and sense organs: Z86.69

## 2015-05-18 HISTORY — DX: Presence of automatic (implantable) cardiac defibrillator: Z95.810

## 2015-05-18 HISTORY — DX: Reserved for inherently not codable concepts without codable children: IMO0001

## 2015-05-18 HISTORY — DX: Effusion, unspecified joint: M25.40

## 2015-05-18 HISTORY — DX: Gastro-esophageal reflux disease without esophagitis: K21.9

## 2015-05-18 HISTORY — DX: Personal history of other infectious and parasitic diseases: Z86.19

## 2015-05-18 HISTORY — DX: Unspecified osteoarthritis, unspecified site: M19.90

## 2015-05-18 HISTORY — DX: Personal history of colon polyps, unspecified: Z86.0100

## 2015-05-18 HISTORY — DX: Pneumonia, unspecified organism: J18.9

## 2015-05-18 HISTORY — DX: Diverticulosis of intestine, part unspecified, without perforation or abscess without bleeding: K57.90

## 2015-05-18 LAB — URINALYSIS, ROUTINE W REFLEX MICROSCOPIC
Bilirubin Urine: NEGATIVE
HGB URINE DIPSTICK: NEGATIVE
KETONES UR: NEGATIVE mg/dL
Leukocytes, UA: NEGATIVE
Nitrite: NEGATIVE
PH: 5.5 (ref 5.0–8.0)
PROTEIN: NEGATIVE mg/dL
Specific Gravity, Urine: 1.027 (ref 1.005–1.030)

## 2015-05-18 LAB — BLOOD GAS, ARTERIAL
Acid-base deficit: 2.2 mmol/L — ABNORMAL HIGH (ref 0.0–2.0)
BICARBONATE: 22.1 meq/L (ref 20.0–24.0)
Drawn by: 42180
FIO2: 0.21
O2 Saturation: 97.5 %
PATIENT TEMPERATURE: 98.6
PH ART: 7.384 (ref 7.350–7.450)
PO2 ART: 106 mmHg — AB (ref 80.0–100.0)
TCO2: 23.2 mmol/L (ref 0–100)
pCO2 arterial: 37.8 mmHg (ref 35.0–45.0)

## 2015-05-18 LAB — CBC
HEMATOCRIT: 42 % (ref 39.0–52.0)
HEMOGLOBIN: 14.2 g/dL (ref 13.0–17.0)
MCH: 29.7 pg (ref 26.0–34.0)
MCHC: 33.8 g/dL (ref 30.0–36.0)
MCV: 87.9 fL (ref 78.0–100.0)
Platelets: 242 10*3/uL (ref 150–400)
RBC: 4.78 MIL/uL (ref 4.22–5.81)
RDW: 13 % (ref 11.5–15.5)
WBC: 9.5 10*3/uL (ref 4.0–10.5)

## 2015-05-18 LAB — TYPE AND SCREEN
ABO/RH(D): A POS
ANTIBODY SCREEN: NEGATIVE

## 2015-05-18 LAB — COMPREHENSIVE METABOLIC PANEL
ALK PHOS: 42 U/L (ref 38–126)
ALT: 11 U/L — AB (ref 17–63)
ANION GAP: 11 (ref 5–15)
AST: 16 U/L (ref 15–41)
Albumin: 4 g/dL (ref 3.5–5.0)
BILIRUBIN TOTAL: 0.5 mg/dL (ref 0.3–1.2)
BUN: 14 mg/dL (ref 6–20)
CALCIUM: 9.9 mg/dL (ref 8.9–10.3)
CO2: 22 mmol/L (ref 22–32)
CREATININE: 0.99 mg/dL (ref 0.61–1.24)
Chloride: 107 mmol/L (ref 101–111)
GFR calc non Af Amer: >60 mL/min (ref >60)
GLUCOSE: 142 mg/dL — AB (ref 65–99)
Potassium: 4.2 mmol/L (ref 3.5–5.1)
SODIUM: 140 mmol/L (ref 135–145)
TOTAL PROTEIN: 6.8 g/dL (ref 6.5–8.1)

## 2015-05-18 LAB — URINE MICROSCOPIC-ADD ON
Bacteria, UA: NONE SEEN
RBC / HPF: NONE SEEN RBC/hpf (ref 0–5)

## 2015-05-18 LAB — ABO/RH: ABO/RH(D): A POS

## 2015-05-18 LAB — PROTIME-INR
INR: 1.06 (ref 0.00–1.49)
Prothrombin Time: 14 seconds (ref 11.6–15.2)

## 2015-05-18 LAB — APTT: aPTT: 28 seconds (ref 24–37)

## 2015-05-18 LAB — GLUCOSE, CAPILLARY: GLUCOSE-CAPILLARY: 108 mg/dL — AB (ref 65–99)

## 2015-05-18 LAB — SURGICAL PCR SCREEN
MRSA, PCR: NEGATIVE
Staphylococcus aureus: POSITIVE — AB

## 2015-05-18 MED ORDER — CHLORHEXIDINE GLUCONATE 4 % EX LIQD: 60.0000 mL | Freq: Once | CUTANEOUS | Status: DC

## 2015-05-18 NOTE — Progress Notes (Addendum)
Cardiologist is Dr.Berry with last visit in epic from 04-01-15  Medical Md is Dr.Lawrence Fusco in [place]  Echo reports in epic from 2003/2013  Stress test reports in epic from 2003/2012  Heart cath in epic from 2001  EKG in epic from 12-16-14  CXR denies in past yr

## 2015-05-18 NOTE — Progress Notes (Signed)
Spoke with [facility]  Rep,Brian Small. Notified of surgery scheduled for 05/22/15 with surgery time of 1055 and pt arriving at [place].

## 2015-05-18 NOTE — Progress Notes (Signed)
Mupirocin script called into [facility] in [place]

## 2015-05-18 NOTE — Pre-Procedure Instructions (Signed)
Jordan Ross  05/18/2015      [facility] - [place], [place] [address] Phone: [phone] Fax: [phone]    Your procedure is scheduled on Fri, Feb 3 @ 10:55 AM  Report to [facility] Admitting at 8:45 AM  Call this number if you have problems the morning of surgery:  [phone]   Remember:  Do not eat food or drink liquids after midnight.  Take these medicines the morning of surgery with A SIP OF WATER Carvedilol(Coreg),Digoxin(Lanoxin), and Pantoprazole(Protonix)              No Goody's,BC's,Aleve,Ibuprofen,Motrin,Advil,Fish Oil,or any Herbal Medications.               How to Manage Your Diabetes Before Surgery   Why is it important to control my blood sugar before and after surgery?   Improving blood sugar levels before and after surgery helps healing and can limit problems.  A way of improving blood sugar control is eating a healthy diet by:  - Eating less sugar and carbohydrates  - Increasing activity/exercise  - Talk with your doctor about reaching your blood sugar goals  High blood sugars (greater than 180 mg/dL) can raise your risk of infections and slow down your recovery so you will need to focus on controlling your diabetes during the weeks before surgery.  Make sure that the doctor who takes care of your diabetes knows about your planned surgery including the date and location.  How do I manage my blood sugars before surgery?   Check your blood sugar at least 4 times a day, 2 days before surgery to make sure that they are not too high or low.   Check your blood sugar the morning of your surgery when you wake up and every 2               hours until you get to the Short-Stay unit.  If your blood sugar is less than 70 mg/dL, you will need to treat for low blood sugar by:  Treat a low blood sugar (less than 70 mg/dL) with 1/2 cup of clear juice (cranberry or apple), 4 glucose tablets,  OR glucose gel.  Recheck blood sugar in 15 minutes after treatment (to make sure it is greater than 70 mg/dL).  If blood sugar is not greater than 70 mg/dL on re-check, call [phone] for further instructions.   Report your blood sugar to the Short-Stay nurse when you get to Short-Stay.  References:  [facility], 2007 "How to Manage your Diabetes Before and After Surgery".  What do I do about my diabetes medications?   Do not take oral diabetes medicines (pills) the morning of surgery.  THE NIGHT BEFORE SURGERY, take 24 units of Lantus Insulin.        Do not take other diabetes injectables the day of surgery including Byetta, Victoza, Bydureon, and Trulicity.    If your CBG is greater than 220 mg/dL, you may take 1/2 of your sliding scale (correction) dose of insulin.   For patients with "Insulin Pumps":  Contact your diabetes doctor for specific instructions before surgery.   Decrease basal insulin rates by 20% at midnight the night before surgery.  Note that if your surgery is planned to be longer than 2 hours, your insulin pump will be removed and intravenous (IV) insulin will be started and managed by the  nurses and anesthesiologist.  You will be able to restart your insulin pump once you are awake and able to manage it.  Make sure to bring insulin pump supplies to the hospital with you in case your site needs to be changed.         Do not wear jewelry, make-up or nail polish.  Do not wear lotions, powders, or perfumes.  You may wear deodorant.  Do not shave 48 hours prior to surgery.  Men may shave face and neck.  Do not bring valuables to the hospital.  [facility] is not responsible for any belongings or valuables.  Contacts, dentures or bridgework may not be worn into surgery.  Leave your suitcase in the car.  After surgery it may be brought to your room.  For patients admitted to the hospital, discharge time will be determined  by your treatment team.  Patients discharged the day of surgery will not be allowed to drive home.    Special instructions: [name] - Preparing for Surgery  Before surgery, you can play an important role.  Because skin is not sterile, your skin needs to be as free of germs as possible.  You can reduce the number of germs on you skin by washing with CHG (chlorahexidine gluconate) soap before surgery.  CHG is an antiseptic cleaner which kills germs and bonds with the skin to continue killing germs even after washing.  Please DO NOT use if you have an allergy to CHG or antibacterial soaps.  If your skin becomes reddened/irritated stop using the CHG and inform your nurse when you arrive at Short Stay.  Do not shave (including legs and underarms) for at least 48 hours prior to the first CHG shower.  You may shave your face.  Please follow these instructions carefully:   1.  Shower with CHG Soap the night before surgery and the                                morning of Surgery.  2.  If you choose to wash your hair, wash your hair first as usual with your       normal shampoo.  3.  After you shampoo, rinse your hair and body thoroughly to remove the                      Shampoo.  4.  Use CHG as you would any other liquid soap.  You can apply chg directly       to the skin and wash gently with scrungie or a clean washcloth.  5.  Apply the CHG Soap to your body ONLY FROM THE NECK DOWN.        Do not use on open wounds or open sores.  Avoid contact with your eyes,       ears, mouth and genitals (private parts).  Wash genitals (private parts)       with your normal soap.  6.  Wash thoroughly, paying special attention to the area where your surgery        will be performed.  7.  Thoroughly rinse your body with warm water from the neck down.  8.  DO NOT shower/wash with your normal soap after using and rinsing off       the CHG Soap.  9.  Pat yourself dry with a clean towel.  10.  Wear clean  pajamas.            11.  Place clean sheets on your bed the night of your first shower and do not        sleep with pets.  Day of Surgery  Do not apply any lotions/deoderants the morning of surgery.  Please wear clean clothes to the hospital/surgery center.    Please read over the following fact sheets that you were given. Pain Booklet, Coughing and Deep Breathing, Blood Transfusion Information, MRSA Information and Surgical Site Infection Prevention

## 2015-05-19 LAB — HEMOGLOBIN A1C
Hgb A1c MFr Bld: 7.4 % — ABNORMAL HIGH (ref 4.8–5.6)
MEAN PLASMA GLUCOSE: 166 mg/dL

## 2015-05-19 NOTE — Progress Notes (Signed)
Anesthesia chart review: Patient is a 70 year old male scheduled for endovascular stent graft repair of AAA on 05/22/2015 by Dr. Trula Slade. Patient was referred to Dr. Trula Slade by his cardiologist Dr Gwenlyn Found following 04/07/15 abdominal aortic U/S showing increased AAA size to 4.4 cm X 5.6 cm.   History includes CAD/anterior wall MI s/p CABG 01/14/00 (LIMA-LAD, SVG-Ramus, SVG-CX-PDA), ischemic cardiomyopathy, chronic systolic CHF, [employer]. Jude Fortify ICD single chamber 02/12/09, DM2, renal artery stenosis, former smoker, AAA.   PCP is Dr. Gerarda Fraction. Cardiologist is Dr. Gwenlyn Found, last visit 04/01/15. Patient denied CP and SOB at that time. EP cardiologist is Dr. Sallyanne Kuster, last visit 12/16/14. Endocrinologist is Dr. Dorris Fetch, last visit 03/23/15.  Meds include aspirin, Lipitor, Coreg, digoxin, glipizide XL, Lantus, Invokana, iron, metformin, niacin, Protonix, spironolactone, valsartan.  12/16/14 EKG: SB at 58 bpm, LAF/LAFB, LVH with QRS widening and repolarization abnormality, anterior infarct (age undetermined). Overall, I think his EKG is stable when compared to 12/24/12 tracing.  04/17/12 Echo: Study Conclusions - Left ventricle: The cavity size was moderately dilated. Systolic function was moderately to severely reduced. The estimated ejection fraction was in the range of 30% to 35%. Severe hypokinesis of the anteroseptal myocardium. Moderate hypokinesis of the apical myocardium. Doppler parameters are consistent with abnormal left ventricular relaxation (grade 1 diastolic dysfunction). - Aortic valve: Valve area: 1.05cm^2(VTI). Valve area: 0.99cm^2 (Vmax). - Mitral valve: Calcified annulus. Mild regurgitation. Valve area by pressure half-time: 1.48cm^2. Valve area by continuity equation (using LVOT flow): 1.32cm^2. - Left atrium: The atrium was mildly dilated. - Atrial septum: No defect or patent foramen ovale was identified. Impressions: - EF aapproximately 35% with antero-septal & apical  wall motion abnormalities. ICD wire in RV. No significant change since last study of 09/2009.  04/07/11 Nuclear stress test: A moderate to severe perfusion defect is seen in the mid anterior, apical anterior, apical and septal regions. This is consistent with infarct/scar. The post stress LV is somewhat enlarged in size. The rest LV is somewhat enlarged in size. There is no scintigraphic evidence of inducible myocardial ischemia. The post stress EF is 23%. Global LV systolic function is severely reduced. EKG is negative for ischemia. Compared to the previous study, there is no significant change. Abnormal myocardial perfusion imaging with no significant ischemia demonstrated. There is moderate LV dilation with a moderate sized LAD distribution scar without ischemia. This is a low risk scan. Clinical correlation recommended.   05/11/15 CTA Chest/Abd/Pelvis: IMPRESSION: - Minimal change in the size of the abdominal aortic aneurysm. The aneurysm now measures up to 5.0 cm and previously measured 4.9 cm in 2013. - Aneurysm of the ascending thoracic aorta measuring up to 4.2 cm. Recommend annual imaging followup by CTA or MRA. This recommendation follows 2010 ACCF/AHA/AATS/ACR/ASA/SCA/SCAI/SIR/STS/SVM Guidelines for the Diagnosis and Management of Patients with Thoracic Aortic Disease. Circulation. 2010; [id number]. - Post CABG with evidence of prior infarct involving the left ventricle apex. - Stable calcified plaque at the left lung base, likely related to prior asbestos exposure. - Narrowing at the origin of the common iliac arteries as described.  11/27/14 Abdominal aorta and renal artery duplex: Impressions:  Stable infrarenal pisiform AAA measuring 5 x 5 cm. Normal bilateral kidney size. Normal right renal artery. 1-59% left renal artery stenosis. Greater than 70% SMA stenosis.  01/07/14 Carotid U/S: Bilateral ICA 0-49% diameter reduction (velocities suggest low in the range). This is a mildly abnormal  carotid duplex Doppler evaluation. When compared to prior study there is no significant change. Is recommended this test  be repeated when clinically indicated.  Preoperative labs noted. A1C is 7.4.   Patient with known CAD, ICD but with recent cardiology follow-up. Patient was asymptomatic at that time. He was referred to [facility] by Dr. Gwenlyn Found. Discussed with anesthesiologist Dr. Tobias Alexander. If patient remains asymptomatic from a CV/CHF standpoint then it is anticipated that he can proceed as planned.  Nursing staff to follow-up on ICD perioperative RX form.  George Hugh [facility] Short Stay Center/Anesthesiology Phone [phone] 05/19/2015 3:04 PM

## 2015-05-21 MED ORDER — DEXTROSE 5 % IV SOLN
1.5000 g | INTRAVENOUS | Status: AC
  Administered 2015-05-22: 1.5 g via INTRAVENOUS
  Filled 2015-05-21: qty 1.5

## 2015-05-21 MED ORDER — SODIUM CHLORIDE 0.9 % IV SOLN: INTRAVENOUS | Status: DC

## 2015-05-21 NOTE — Progress Notes (Signed)
Refaxed device order from to device clinic after they said they could not find the first one.  They said they will get it back to [place] today.

## 2015-05-22 ENCOUNTER — Inpatient Hospital Stay (HOSPITAL_COMMUNITY): Payer: Medicare Other | Admitting: Vascular Surgery

## 2015-05-22 ENCOUNTER — Encounter (HOSPITAL_COMMUNITY)
Admission: RE | Disposition: A | Discharge status: Home / Self Care | Payer: Self-pay | Source: Ambulatory Visit | Attending: Surgery

## 2015-05-22 ENCOUNTER — Encounter (HOSPITAL_COMMUNITY): Payer: Self-pay | Admitting: *Deleted

## 2015-05-22 ENCOUNTER — Inpatient Hospital Stay (HOSPITAL_COMMUNITY): Payer: Medicare Other

## 2015-05-22 ENCOUNTER — Inpatient Hospital Stay (HOSPITAL_COMMUNITY)
Admission: RE | Admit: 2015-05-22 | Discharge: 2015-05-23 | DRG: 269 | Disposition: A | Discharge status: Home / Self Care | Payer: Medicare Other | Source: Ambulatory Visit | Attending: Surgery | Admitting: Surgery

## 2015-05-22 ENCOUNTER — Inpatient Hospital Stay (HOSPITAL_COMMUNITY): Payer: Medicare Other | Admitting: Certified Registered"

## 2015-05-22 DIAGNOSIS — I509 Heart failure, unspecified: Secondary | ICD-10-CM | POA: Diagnosis present

## 2015-05-22 DIAGNOSIS — Z95810 Presence of automatic (implantable) cardiac defibrillator: Secondary | ICD-10-CM | POA: Diagnosis not present

## 2015-05-22 DIAGNOSIS — Z87891 Personal history of nicotine dependence: Secondary | ICD-10-CM | POA: Diagnosis not present

## 2015-05-22 DIAGNOSIS — E119 Type 2 diabetes mellitus without complications: Secondary | ICD-10-CM | POA: Diagnosis present

## 2015-05-22 DIAGNOSIS — I110 Hypertensive heart disease with heart failure: Secondary | ICD-10-CM | POA: Diagnosis not present

## 2015-05-22 DIAGNOSIS — I714 Abdominal aortic aneurysm, without rupture, unspecified: Secondary | ICD-10-CM | POA: Diagnosis present

## 2015-05-22 DIAGNOSIS — Z7982 Long term (current) use of aspirin: Secondary | ICD-10-CM

## 2015-05-22 DIAGNOSIS — I2510 Atherosclerotic heart disease of native coronary artery without angina pectoris: Secondary | ICD-10-CM | POA: Diagnosis present

## 2015-05-22 DIAGNOSIS — I252 Old myocardial infarction: Secondary | ICD-10-CM

## 2015-05-22 DIAGNOSIS — Z794 Long term (current) use of insulin: Secondary | ICD-10-CM

## 2015-05-22 DIAGNOSIS — I701 Atherosclerosis of renal artery: Secondary | ICD-10-CM | POA: Diagnosis not present

## 2015-05-22 DIAGNOSIS — E785 Hyperlipidemia, unspecified: Secondary | ICD-10-CM | POA: Diagnosis present

## 2015-05-22 DIAGNOSIS — Z9889 Other specified postprocedural states: Secondary | ICD-10-CM

## 2015-05-22 DIAGNOSIS — Z951 Presence of aortocoronary bypass graft: Secondary | ICD-10-CM | POA: Diagnosis not present

## 2015-05-22 DIAGNOSIS — K219 Gastro-esophageal reflux disease without esophagitis: Secondary | ICD-10-CM | POA: Diagnosis not present

## 2015-05-22 DIAGNOSIS — Z95828 Presence of other vascular implants and grafts: Secondary | ICD-10-CM | POA: Diagnosis not present

## 2015-05-22 DIAGNOSIS — M1990 Unspecified osteoarthritis, unspecified site: Secondary | ICD-10-CM | POA: Diagnosis not present

## 2015-05-22 DIAGNOSIS — I255 Ischemic cardiomyopathy: Secondary | ICD-10-CM | POA: Diagnosis present

## 2015-05-22 HISTORY — PX: ABDOMINAL AORTIC ENDOVASCULAR STENT GRAFT: SHX5707

## 2015-05-22 LAB — CBC
HCT: 36.1 % — ABNORMAL LOW (ref 39.0–52.0)
HEMATOCRIT: 36.4 % — AB (ref 39.0–52.0)
Hemoglobin: 12.2 g/dL — ABNORMAL LOW (ref 13.0–17.0)
Hemoglobin: 12.3 g/dL — ABNORMAL LOW (ref 13.0–17.0)
MCH: 29.7 pg (ref 26.0–34.0)
MCH: 29.6 pg (ref 26.0–34.0)
MCHC: 33.8 g/dL (ref 30.0–36.0)
MCHC: 33.8 g/dL (ref 30.0–36.0)
MCV: 87.8 fL (ref 78.0–100.0)
MCV: 87.7 fL (ref 78.0–100.0)
PLATELETS: 191 10*3/uL (ref 150–400)
PLATELETS: 186 10*3/uL (ref 150–400)
RBC: 4.11 MIL/uL — ABNORMAL LOW (ref 4.22–5.81)
RBC: 4.15 MIL/uL — AB (ref 4.22–5.81)
RDW: 13.1 % (ref 11.5–15.5)
RDW: 13.1 % (ref 11.5–15.5)
WBC: 7.1 10*3/uL (ref 4.0–10.5)
WBC: 8 10*3/uL (ref 4.0–10.5)

## 2015-05-22 LAB — BASIC METABOLIC PANEL
ANION GAP: 7 (ref 5–15)
BUN: 11 mg/dL (ref 6–20)
CALCIUM: 8.7 mg/dL — AB (ref 8.9–10.3)
CO2: 26 mmol/L (ref 22–32)
CREATININE: 0.91 mg/dL (ref 0.61–1.24)
Chloride: 110 mmol/L (ref 101–111)
Glucose, Bld: 94 mg/dL (ref 65–99)
Potassium: 3.6 mmol/L (ref 3.5–5.1)
SODIUM: 143 mmol/L (ref 135–145)

## 2015-05-22 LAB — PROTIME-INR
INR: 1.2 (ref 0.00–1.49)
Prothrombin Time: 15.3 seconds — ABNORMAL HIGH (ref 11.6–15.2)

## 2015-05-22 LAB — GLUCOSE, CAPILLARY
GLUCOSE-CAPILLARY: 128 mg/dL — AB (ref 65–99)
GLUCOSE-CAPILLARY: 86 mg/dL (ref 65–99)
Glucose-Capillary: 78 mg/dL (ref 65–99)
Glucose-Capillary: 117 mg/dL — ABNORMAL HIGH (ref 65–99)

## 2015-05-22 LAB — MAGNESIUM: Magnesium: 1.5 mg/dL — ABNORMAL LOW (ref 1.7–2.4)

## 2015-05-22 LAB — CREATININE, SERUM
Creatinine, Ser: 0.88 mg/dL (ref 0.61–1.24)
GFR calc Af Amer: >60 mL/min (ref >60)
GFR calc non Af Amer: >60 mL/min (ref >60)

## 2015-05-22 LAB — APTT: APTT: 32 s (ref 24–37)

## 2015-05-22 SURGERY — INSERTION, ENDOVASCULAR STENT GRAFT, AORTA, ABDOMINAL
Anesthesia: General | Site: Abdomen

## 2015-05-22 MED ORDER — NEOSTIGMINE METHYLSULFATE 10 MG/10ML IV SOLN
INTRAVENOUS | Status: AC
  Filled 2015-05-22: qty 1

## 2015-05-22 MED ORDER — SODIUM CHLORIDE 0.9 % IJ SOLN
INTRAMUSCULAR | Status: AC
  Filled 2015-05-22: qty 10

## 2015-05-22 MED ORDER — METOPROLOL TARTRATE 1 MG/ML IV SOLN: 2.0000 mg | INTRAVENOUS | Status: DC | PRN

## 2015-05-22 MED ORDER — LIDOCAINE HCL (CARDIAC) 20 MG/ML IV SOLN
INTRAVENOUS | Status: DC | PRN
  Administered 2015-05-22: 60 mg via INTRAVENOUS

## 2015-05-22 MED ORDER — LABETALOL HCL 5 MG/ML IV SOLN: 10.0000 mg | INTRAVENOUS | Status: DC | PRN

## 2015-05-22 MED ORDER — PHENOL 1.4 % MT LIQD
1.0000 | OROMUCOSAL | Status: DC | PRN
Start: 2015-05-22 — End: 2015-05-23

## 2015-05-22 MED ORDER — IODIXANOL 320 MG/ML IV SOLN
INTRAVENOUS | Status: DC | PRN
  Administered 2015-05-22: 100 mL via INTRAVENOUS

## 2015-05-22 MED ORDER — MAGNESIUM SULFATE 2 GM/50ML IV SOLN: 2.0000 g | Freq: Every day | INTRAVENOUS | Status: DC | PRN

## 2015-05-22 MED ORDER — LACTATED RINGERS IV SOLN: INTRAVENOUS | Status: DC

## 2015-05-22 MED ORDER — ATORVASTATIN CALCIUM 40 MG PO TABS
40.0000 mg | ORAL_TABLET | Freq: Every day | ORAL | Status: DC
Start: 2015-05-22 — End: 2015-05-23
  Administered 2015-05-22: 40 mg via ORAL
  Filled 2015-05-22: qty 1

## 2015-05-22 MED ORDER — PROPOFOL 10 MG/ML IV BOLUS
INTRAVENOUS | Status: AC
  Filled 2015-05-22: qty 20

## 2015-05-22 MED ORDER — FENTANYL CITRATE (PF) 100 MCG/2ML IJ SOLN
INTRAMUSCULAR | Status: DC | PRN
  Administered 2015-05-22 (×2): 100 ug via INTRAVENOUS
  Administered 2015-05-22: 50 ug via INTRAVENOUS

## 2015-05-22 MED ORDER — PROMETHAZINE HCL 25 MG/ML IJ SOLN: 6.2500 mg | INTRAMUSCULAR | Status: DC | PRN

## 2015-05-22 MED ORDER — SODIUM CHLORIDE 0.9 % IV SOLN
500.0000 mL | Freq: Once | INTRAVENOUS | Status: DC | PRN
Start: 2015-05-22 — End: 2015-05-23

## 2015-05-22 MED ORDER — EPHEDRINE SULFATE 50 MG/ML IJ SOLN
INTRAMUSCULAR | Status: DC | PRN
  Administered 2015-05-22 (×5): 10 mg via INTRAVENOUS

## 2015-05-22 MED ORDER — LIDOCAINE HCL (CARDIAC) 20 MG/ML IV SOLN
INTRAVENOUS | Status: AC
  Filled 2015-05-22: qty 5

## 2015-05-22 MED ORDER — POTASSIUM CHLORIDE CRYS ER 20 MEQ PO TBCR: 20.0000 meq | EXTENDED_RELEASE_TABLET | Freq: Every day | ORAL | Status: DC | PRN

## 2015-05-22 MED ORDER — ENOXAPARIN SODIUM 40 MG/0.4ML ~~LOC~~ SOLN
40.0000 mg | SUBCUTANEOUS | Status: DC
  Administered 2015-05-23: 40 mg via SUBCUTANEOUS
  Filled 2015-05-22: qty 0.4

## 2015-05-22 MED ORDER — GLYCOPYRROLATE 0.2 MG/ML IJ SOLN
INTRAMUSCULAR | Status: AC
  Filled 2015-05-22: qty 3

## 2015-05-22 MED ORDER — PROPOFOL 10 MG/ML IV BOLUS
INTRAVENOUS | Status: DC | PRN
  Administered 2015-05-22: 120 mg via INTRAVENOUS

## 2015-05-22 MED ORDER — DIGOXIN 125 MCG PO TABS
125.0000 ug | ORAL_TABLET | Freq: Every day | ORAL | Status: DC
  Administered 2015-05-23: 125 ug via ORAL
  Filled 2015-05-22: qty 1

## 2015-05-22 MED ORDER — DEXTROSE 5 % IV SOLN
1.5000 g | Freq: Two times a day (BID) | INTRAVENOUS | Status: DC
  Administered 2015-05-22: 1.5 g via INTRAVENOUS
  Filled 2015-05-22 (×3): qty 1.5

## 2015-05-22 MED ORDER — INSULIN ASPART 100 UNIT/ML ~~LOC~~ SOLN: 0.0000 [IU] | Freq: Three times a day (TID) | SUBCUTANEOUS | Status: DC

## 2015-05-22 MED ORDER — ONDANSETRON HCL 4 MG/2ML IJ SOLN
INTRAMUSCULAR | Status: AC
  Filled 2015-05-22: qty 2

## 2015-05-22 MED ORDER — METFORMIN HCL 500 MG PO TABS
1000.0000 mg | ORAL_TABLET | Freq: Two times a day (BID) | ORAL | Status: DC
  Administered 2015-05-23: 1000 mg via ORAL
  Filled 2015-05-22 (×2): qty 2

## 2015-05-22 MED ORDER — ROCURONIUM BROMIDE 50 MG/5ML IV SOLN
INTRAVENOUS | Status: AC
  Filled 2015-05-22: qty 1

## 2015-05-22 MED ORDER — NIACIN ER (ANTIHYPERLIPIDEMIC) 500 MG PO TBCR
500.0000 mg | EXTENDED_RELEASE_TABLET | Freq: Every day | ORAL | Status: DC
  Administered 2015-05-22: 500 mg via ORAL
  Filled 2015-05-22 (×2): qty 1

## 2015-05-22 MED ORDER — FENTANYL CITRATE (PF) 250 MCG/5ML IJ SOLN
INTRAMUSCULAR | Status: AC
  Filled 2015-05-22: qty 5

## 2015-05-22 MED ORDER — CARVEDILOL 12.5 MG PO TABS
12.5000 mg | ORAL_TABLET | Freq: Two times a day (BID) | ORAL | Status: DC
  Administered 2015-05-22 – 2015-05-23 (×2): 12.5 mg via ORAL
  Filled 2015-05-22 (×2): qty 1

## 2015-05-22 MED ORDER — MIDAZOLAM HCL 5 MG/5ML IJ SOLN
INTRAMUSCULAR | Status: DC | PRN
Start: 2015-05-22 — End: 2015-05-22
  Administered 2015-05-22: 2 mg via INTRAVENOUS

## 2015-05-22 MED ORDER — ARTIFICIAL TEARS OP OINT
TOPICAL_OINTMENT | OPHTHALMIC | Status: AC
  Filled 2015-05-22: qty 3.5

## 2015-05-22 MED ORDER — ROCURONIUM BROMIDE 100 MG/10ML IV SOLN
INTRAVENOUS | Status: DC | PRN
  Administered 2015-05-22: 50 mg via INTRAVENOUS

## 2015-05-22 MED ORDER — PROTAMINE SULFATE 10 MG/ML IV SOLN
INTRAVENOUS | Status: DC | PRN
  Administered 2015-05-22: 50 mg via INTRAVENOUS

## 2015-05-22 MED ORDER — SUCCINYLCHOLINE CHLORIDE 20 MG/ML IJ SOLN
INTRAMUSCULAR | Status: AC
  Filled 2015-05-22: qty 1

## 2015-05-22 MED ORDER — ACETAMINOPHEN 325 MG PO TABS
325.0000 mg | ORAL_TABLET | ORAL | Status: DC | PRN
  Administered 2015-05-22 – 2015-05-23 (×2): 650 mg via ORAL
  Filled 2015-05-22 (×2): qty 2

## 2015-05-22 MED ORDER — EPHEDRINE SULFATE 50 MG/ML IJ SOLN
INTRAMUSCULAR | Status: AC
  Filled 2015-05-22: qty 1

## 2015-05-22 MED ORDER — SODIUM CHLORIDE 0.9 % IV SOLN
INTRAVENOUS | Status: DC | PRN
  Administered 2015-05-22: 500 mL

## 2015-05-22 MED ORDER — GUAIFENESIN-DM 100-10 MG/5ML PO SYRP: 15.0000 mL | ORAL_SOLUTION | ORAL | Status: DC | PRN

## 2015-05-22 MED ORDER — ONDANSETRON HCL 4 MG/2ML IJ SOLN
INTRAMUSCULAR | Status: DC | PRN
  Administered 2015-05-22: 4 mg via INTRAVENOUS

## 2015-05-22 MED ORDER — OXYCODONE HCL 5 MG PO TABS
5.0000 mg | ORAL_TABLET | ORAL | Status: DC | PRN
  Administered 2015-05-22 – 2015-05-23 (×3): 5 mg via ORAL
  Filled 2015-05-22 (×3): qty 1

## 2015-05-22 MED ORDER — ALUM & MAG HYDROXIDE-SIMETH 200-200-20 MG/5ML PO SUSP: 15.0000 mL | ORAL | Status: DC | PRN

## 2015-05-22 MED ORDER — HYDRALAZINE HCL 20 MG/ML IJ SOLN: 5.0000 mg | INTRAMUSCULAR | Status: DC | PRN

## 2015-05-22 MED ORDER — GLYCOPYRROLATE 0.2 MG/ML IJ SOLN
INTRAMUSCULAR | Status: DC | PRN
  Administered 2015-05-22 (×2): 0.1 mg via INTRAVENOUS

## 2015-05-22 MED ORDER — 0.9 % SODIUM CHLORIDE (POUR BTL) OPTIME
TOPICAL | Status: DC | PRN
  Administered 2015-05-22: 1000 mL

## 2015-05-22 MED ORDER — PANTOPRAZOLE SODIUM 40 MG PO TBEC
40.0000 mg | DELAYED_RELEASE_TABLET | Freq: Every day | ORAL | Status: DC
  Administered 2015-05-23: 40 mg via ORAL
  Filled 2015-05-22: qty 1

## 2015-05-22 MED ORDER — ACETAMINOPHEN 650 MG RE SUPP: 325.0000 mg | RECTAL | Status: DC | PRN

## 2015-05-22 MED ORDER — LACTATED RINGERS IV SOLN
INTRAVENOUS | Status: DC | PRN
  Administered 2015-05-22 (×2): via INTRAVENOUS

## 2015-05-22 MED ORDER — DOCUSATE SODIUM 100 MG PO CAPS
100.0000 mg | ORAL_CAPSULE | Freq: Every day | ORAL | Status: DC
  Administered 2015-05-23: 100 mg via ORAL
  Filled 2015-05-22: qty 1

## 2015-05-22 MED ORDER — CANAGLIFLOZIN 300 MG PO TABS
300.0000 mg | ORAL_TABLET | Freq: Every day | ORAL | Status: DC
  Filled 2015-05-22 (×2): qty 1

## 2015-05-22 MED ORDER — SUGAMMADEX SODIUM 200 MG/2ML IV SOLN
INTRAVENOUS | Status: DC | PRN
  Administered 2015-05-22: 200 mg via INTRAVENOUS

## 2015-05-22 MED ORDER — INSULIN GLARGINE 100 UNIT/ML ~~LOC~~ SOLN
30.0000 [IU] | Freq: Every day | SUBCUTANEOUS | Status: DC
  Administered 2015-05-22: 30 [IU] via SUBCUTANEOUS
  Filled 2015-05-22 (×2): qty 0.3

## 2015-05-22 MED ORDER — SPIRONOLACTONE 25 MG PO TABS
25.0000 mg | ORAL_TABLET | Freq: Every day | ORAL | Status: DC
  Administered 2015-05-22 – 2015-05-23 (×2): 25 mg via ORAL
  Filled 2015-05-22 (×2): qty 1

## 2015-05-22 MED ORDER — ASPIRIN EC 81 MG PO TBEC
81.0000 mg | DELAYED_RELEASE_TABLET | Freq: Every day | ORAL | Status: DC
  Administered 2015-05-22 – 2015-05-23 (×2): 81 mg via ORAL
  Filled 2015-05-22 (×3): qty 1

## 2015-05-22 MED ORDER — MEPERIDINE HCL 25 MG/ML IJ SOLN: 6.2500 mg | INTRAMUSCULAR | Status: DC | PRN

## 2015-05-22 MED ORDER — OXYCODONE-ACETAMINOPHEN 5-325 MG PO TABS: 1.0000 | ORAL_TABLET | Freq: Four times a day (QID) | ORAL | Status: DC | PRN

## 2015-05-22 MED ORDER — SUGAMMADEX SODIUM 200 MG/2ML IV SOLN
INTRAVENOUS | Status: AC
  Filled 2015-05-22: qty 2

## 2015-05-22 MED ORDER — ONDANSETRON HCL 4 MG/2ML IJ SOLN
4.0000 mg | Freq: Four times a day (QID) | INTRAMUSCULAR | Status: DC | PRN
Start: 2015-05-22 — End: 2015-05-23

## 2015-05-22 MED ORDER — FENTANYL CITRATE (PF) 100 MCG/2ML IJ SOLN: 25.0000 ug | INTRAMUSCULAR | Status: DC | PRN

## 2015-05-22 MED ORDER — HEPARIN SODIUM (PORCINE) 1000 UNIT/ML IJ SOLN
INTRAMUSCULAR | Status: DC | PRN
  Administered 2015-05-22: 8 mL via INTRAVENOUS

## 2015-05-22 MED ORDER — MIDAZOLAM HCL 2 MG/2ML IJ SOLN
INTRAMUSCULAR | Status: AC
  Filled 2015-05-22: qty 2

## 2015-05-22 MED ORDER — IRBESARTAN 150 MG PO TABS
150.0000 mg | ORAL_TABLET | Freq: Every day | ORAL | Status: DC
  Administered 2015-05-22: 150 mg via ORAL
  Filled 2015-05-22 (×2): qty 1

## 2015-05-22 SURGICAL SUPPLY — 62 items
BAG SNAP BAND KOVER 36X36 (MISCELLANEOUS) ×2 IMPLANT
BLADE SURG CLIPPER 3M 9600 (MISCELLANEOUS) ×3 IMPLANT
CANISTER SUCTION 2500CC (MISCELLANEOUS) ×3 IMPLANT
CATH ANGIO 5F BER2 65CM (CATHETERS) ×2 IMPLANT
CATH BEACON 5.038 65CM KMP-01 (CATHETERS) IMPLANT
CATH OMNI FLUSH .035X70CM (CATHETERS) ×2 IMPLANT
COVER PROBE W GEL 5X96 (DRAPES) ×1 IMPLANT
DEVICE CLOSURE PERCLS PRGLD 6F (VASCULAR PRODUCTS) IMPLANT
DRAPE ZERO GRAVITY STERILE (DRAPES) ×3 IMPLANT
DRSG TEGADERM 2-3/8X2-3/4 SM (GAUZE/BANDAGES/DRESSINGS) ×6 IMPLANT
DRYSEAL FLEXSHEATH 12FR 33CM (SHEATH) ×2
DRYSEAL FLEXSHEATH 18FR 33CM (SHEATH) ×2
ELECT REM PT RETURN 9FT ADLT (ELECTROSURGICAL) ×6
ELECTRODE REM PT RTRN 9FT ADLT (ELECTROSURGICAL) ×2 IMPLANT
EXCLUDER AORTIC EXTEND 32X4.5 (Vascular Products) ×2 IMPLANT
EXCLUDER TNK LEG 31MX14X13 (Endovascular Graft) IMPLANT
EXCLUDER TRUNK LEG 31MX14X13 (Endovascular Graft) ×3 IMPLANT
GAUZE SPONGE 2X2 8PLY STRL LF (GAUZE/BANDAGES/DRESSINGS) ×2 IMPLANT
GLOVE BIOGEL PI IND STRL 6.5 (GLOVE) IMPLANT
GLOVE BIOGEL PI IND STRL 7.5 (GLOVE) ×1 IMPLANT
GLOVE BIOGEL PI INDICATOR 6.5 (GLOVE) ×4
GLOVE BIOGEL PI INDICATOR 7.5 (GLOVE) ×4
GLOVE ECLIPSE 6.5 STRL STRAW (GLOVE) ×2 IMPLANT
GLOVE ECLIPSE 7.0 STRL STRAW (GLOVE) ×2 IMPLANT
GLOVE SURG SS PI 7.5 STRL IVOR (GLOVE) ×3 IMPLANT
GOWN STRL REUS W/ TWL LRG LVL3 (GOWN DISPOSABLE) ×2 IMPLANT
GOWN STRL REUS W/ TWL XL LVL3 (GOWN DISPOSABLE) ×1 IMPLANT
GOWN STRL REUS W/TWL LRG LVL3 (GOWN DISPOSABLE) ×6
GOWN STRL REUS W/TWL XL LVL3 (GOWN DISPOSABLE) ×3
GRAFT BALLN CATH 65CM (STENTS) IMPLANT
GRAFT EXCLUDER LEG 12X10 (Endovascular Graft) ×2 IMPLANT
GRAFT EXCLUDER LEG 12X14 (Endovascular Graft) ×2 IMPLANT
HEMOSTAT SNOW SURGICEL 2X4 (HEMOSTASIS) IMPLANT
KIT BASIN OR (CUSTOM PROCEDURE TRAY) ×3 IMPLANT
KIT ROOM TURNOVER OR (KITS) ×3 IMPLANT
LIQUID BAND (GAUZE/BANDAGES/DRESSINGS) ×3 IMPLANT
NDL PERC 18GX7CM (NEEDLE) ×1 IMPLANT
NEEDLE PERC 18GX7CM (NEEDLE) ×3 IMPLANT
NS IRRIG 1000ML POUR BTL (IV SOLUTION) ×3 IMPLANT
PACK ENDOVASCULAR (PACKS) ×3 IMPLANT
PAD ARMBOARD 7.5X6 YLW CONV (MISCELLANEOUS) ×6 IMPLANT
PERCLOSE PROGLIDE 6F (VASCULAR PRODUCTS) ×18
SHEATH AVANTI 11CM 8FR (MISCELLANEOUS) ×2 IMPLANT
SHEATH BRITE TIP 8FR 23CM (MISCELLANEOUS) ×2 IMPLANT
SHEATH DRYSEAL FLEX 12FR 33CM (SHEATH) IMPLANT
SHEATH DRYSEAL FLEX 18FR 33CM (SHEATH) IMPLANT
SHIELD RADPAD SCOOP 12X17 (MISCELLANEOUS) ×4 IMPLANT
SPONGE GAUZE 2X2 STER 10/PKG (GAUZE/BANDAGES/DRESSINGS) ×2
STENT GRAFT BALLN CATH 65CM (STENTS) ×2
STOPCOCK MORSE 400PSI 3WAY (MISCELLANEOUS) ×3 IMPLANT
SUT ETHILON 3 0 PS 1 (SUTURE) IMPLANT
SUT PROLENE 5 0 C 1 24 (SUTURE) IMPLANT
SUT VIC AB 2-0 CT1 27 (SUTURE)
SUT VIC AB 2-0 CT1 TAPERPNT 27 (SUTURE) IMPLANT
SUT VIC AB 3-0 SH 27 (SUTURE)
SUT VIC AB 3-0 SH 27X BRD (SUTURE) IMPLANT
SUT VICRYL 4-0 PS2 18IN ABS (SUTURE) ×6 IMPLANT
SYR 30ML LL (SYRINGE) ×3 IMPLANT
TRAY FOLEY W/METER SILVER 16FR (SET/KITS/TRAYS/PACK) ×3 IMPLANT
TUBING HIGH PRESSURE 120CM (CONNECTOR) ×3 IMPLANT
WIRE AMPLATZ SS-J .035X180CM (WIRE) ×4 IMPLANT
WIRE BENTSON .035X145CM (WIRE) ×4 IMPLANT

## 2015-05-22 NOTE — Progress Notes (Signed)
Noe Gens actually resumed care on admission to pacu was charting under robin roberts Kaveh Kissinger was primary caregiver and charter but under wrong name

## 2015-05-22 NOTE — Progress Notes (Signed)
      Patient alert and oriented Palpable DP/PT bilaterally Groins soft without hematoma  S/P EVAR Disposition stable Pending bed on 3S  Thora Scherman MAUREEN PA-C

## 2015-05-22 NOTE — Anesthesia Procedure Notes (Signed)
Procedure Name: Intubation Date/Time: 05/22/2015 10:18 AM Performed by: Lance Coon Pre-anesthesia Checklist: Patient identified Patient Re-evaluated:Patient Re-evaluated prior to inductionOxygen Delivery Method: Circle system utilized Preoxygenation: Pre-oxygenation with 100% oxygen Intubation Type: IV induction Ventilation: Mask ventilation without difficulty Laryngoscope Size: Miller and 2 Grade View: Grade I Tube type: Oral Tube size: 7.5 mm Number of attempts: 1 Airway Equipment and Method: Stylet Placement Confirmation: ETT inserted through vocal cords under direct vision,  positive ETCO2 and CO2 detector Secured at: 22 cm Tube secured with: Tape Dental Injury: Teeth and Oropharynx as per pre-operative assessment

## 2015-05-22 NOTE — Interval H&P Note (Signed)
History and Physical Interval Note:  05/22/2015 9:38 AM  Jordan Ross  has presented today for surgery, with the diagnosis of AAA  I71.4  The various methods of treatment have been discussed with the patient and family. After consideration of risks, benefits and other options for treatment, the patient has consented to  Procedure(s): ABDOMINAL AORTIC ENDOVASCULAR STENT GRAFT (N/A) as a surgical intervention .  The patient's history has been reviewed, patient examined, no change in status, stable for surgery.  I have reviewed the patient's chart and labs.  Questions were answered to the patient's satisfaction.     Annamarie Major

## 2015-05-22 NOTE — Transfer of Care (Signed)
Immediate Anesthesia Transfer of Care Note  Patient: Jordan Ross  Procedure(s) Performed: Procedure(s): ABDOMINAL AORTIC ENDOVASCULAR STENT GRAFT (N/A)  Patient Location: PACU  Anesthesia Type:General  Level of Consciousness: awake, alert , oriented and patient cooperative  Airway & Oxygen Therapy: Patient Spontanous Breathing and Patient connected to nasal cannula oxygen  Post-op Assessment: Report given to RN, Post -op Vital signs reviewed and stable and Patient moving all extremities X 4  Post vital signs: Reviewed and stable  Last Vitals:  Filed Vitals:   05/22/15 0858  BP: 146/81  Pulse: 52  Temp: [id number] C    Complications: No apparent anesthesia complications

## 2015-05-22 NOTE — Anesthesia Preprocedure Evaluation (Addendum)
Anesthesia Evaluation  Patient identified by MRN, date of birth, ID band Patient awake    Reviewed: Allergy & Precautions, NPO status , Patient's Chart, lab work & pertinent test results  Airway Mallampati: II  TM Distance: >3 FB Neck ROM: Full    Dental  (+) Upper Dentures, Chipped, Missing, Dental Advisory Given,    Pulmonary shortness of breath, pneumonia, former smoker,    breath sounds clear to auscultation       Cardiovascular hypertension, Pt. on medications and Pt. on home beta blockers + CAD, + Past MI, + CABG and + Peripheral Vascular Disease  + Cardiac Defibrillator  Rhythm:Regular Rate:Normal     Neuro/Psych negative neurological ROS  negative psych ROS   GI/Hepatic Neg liver ROS, GERD  Medicated and Controlled,  Endo/Other  diabetes, Type 2, Insulin Dependent  Renal/GU Renal disease  negative genitourinary   Musculoskeletal  (+) Arthritis , Osteoarthritis,    Abdominal   Peds negative pediatric ROS (+)  Hematology negative hematology ROS (+)   Anesthesia Other Findings   Reproductive/Obstetrics negative OB ROS                          Lab Results  Component Value Date   WBC 9.5 05/18/2015   HGB 14.2 05/18/2015   HCT 42.0 05/18/2015   MCV 87.9 05/18/2015   PLT 242 05/18/2015   Lab Results  Component Value Date   CREATININE 0.99 05/18/2015   BUN 14 05/18/2015   NA 140 05/18/2015   K 4.2 05/18/2015   CL 107 05/18/2015   CO2 22 05/18/2015   Lab Results  Component Value Date   INR 1.06 05/18/2015   11/2014 EKG: sinus bradycardia, LVH .    Anesthesia Physical Anesthesia Plan  ASA: III  Anesthesia Plan: General   Post-op Pain Management:    Induction: Intravenous  Airway Management Planned: Oral ETT  Additional Equipment: Arterial line  Intra-op Plan:   Post-operative Plan: Extubation in OR  Informed Consent: I have reviewed the patients History and  Physical, chart, labs and discussed the procedure including the risks, benefits and alternatives for the proposed anesthesia with the patient or authorized representative who has indicated his/her understanding and acceptance.   Dental advisory given  Plan Discussed with: CRNA, Anesthesiologist and Surgeon  Anesthesia Plan Comments:        Anesthesia Quick Evaluation

## 2015-05-22 NOTE — Progress Notes (Signed)
Pt admitted into 3s15 from PACU with RN on monitor. Pt awake, oriented, VSS, and bilateral pedal pulses palpable. Bilateral groin incisions dressing clean, dry and intact, level zeros. CBG 78. Family at bedside. Will continue to monitor.

## 2015-05-22 NOTE — H&P (View-Only) (Signed)
Patient name: Jordan Ross MRN: [id number] DOB: Jan 23, 1946 Sex: male     Chief Complaint  Patient presents with  . Re-evaluation    1 wk f/u - CTA abd/pelvis/chest prior    HISTORY OF PRESENT ILLNESS:  The patient is back after having had CT scan to evaluate an abdominal aortic aneurysm.   He initially presented with an ultrasound which revealed a 5.6 and meter aneurysm. He is asymptomatic.  The patient suffers coronary artery disease and is status post CABG in 2001. He is medically managed for hypercholesterolemia with a statin. His blood pressure is controlled with an ARB. He suffers from diabetes which is poorly controlled. His hemoglobin A1c from 03/17/2015 was 7.4. He has a history of bilateral renal artery stenosis. He has a Data processing manager in place.  Past Medical History  Diagnosis Date  . Status post coronary artery bypass grafting   . Ischemic cardiomyopathy   . Abdominal aortic aneurysm ([place])   . Hypertension   . Hyperlipidemia   . Type 2 diabetes mellitus ([place])   . ICD (implantable cardioverter-defibrillator), single, in situ   . Coronary artery disease     AWMI 2001  . LV dysfunction   . CHF (congestive heart failure) ([place])   . Myocardial infarction [facility]) Sept. 25, 2001    Heart Attack    Past Surgical History  Procedure Laterality Date  . Colonoscopy N/A 03/21/2014    Procedure: COLONOSCOPY;  Surgeon: Rogene Houston, MD;  Location: [facility]ITE;  Service: Endoscopy;  Laterality: N/A;  855 - moved to 12/4 @ 8:30 - Ann notified pt  . Doppler echocardiography      2011 38% EF.2013 EF 30-35%    Social History   Social History  . Marital Status: Widowed    Spouse Name: N/A  . Number of Children: N/A  . Years of Education: N/A   Occupational History  . Not on file.   Social History Main Topics  . Smoking status: Former Smoker -- 59 years    Quit date: 11/13/1998  . Smokeless tobacco: Never Used  . Alcohol Use: Yes     Comment:  occasional   . Drug Use: No  . Sexual Activity: Not on file   Other Topics Concern  . Not on file   Social History Narrative    Family History  Problem Relation Age of Onset  . COPD Mother   . Heart disease Brother     Allergies as of 05/11/2015  . (No Known Allergies)    Current Outpatient Prescriptions on File Prior to Visit  Medication Sig Dispense Refill  . aspirin 81 MG tablet Take 81 mg by mouth daily.     [name] atorvastatin (LIPITOR) 40 MG tablet Take 40 mg by mouth daily.    . carvedilol (COREG) 12.5 MG tablet Take 12.5 mg by mouth 2 (two) times daily.    . digoxin (LANOXIN) 0.125 MG tablet Take 125 mcg by mouth daily.    [name] GLIPIZIDE XL 10 MG 24 hr tablet Take 1 tablet by mouth daily. Reported on 05/04/2015    . Insulin Glargine (LANTUS SOLOSTAR) 100 UNIT/ML Solostar Pen Inject 30 Units into the skin at bedtime.    . INVOKANA 300 MG TABS tablet     . IRON PO Take 65 mg by mouth daily.    . metFORMIN (GLUCOPHAGE) 500 MG tablet Take 1,000 mg by mouth 2 (two) times daily with a meal.     .  niacin (NIASPAN) 500 MG CR tablet Take 500 mg by mouth daily.    . pantoprazole (PROTONIX) 40 MG tablet Take 40 mg by mouth daily.    . polyethylene glycol-electrolytes (NULYTELY/GOLYTELY) 420 G solution Take 4,000 mLs by mouth once. 4000 mL 0  . spironolactone (ALDACTONE) 25 MG tablet Take 25 mg by mouth daily.    . valsartan (DIOVAN) 160 MG tablet Take 160 mg by mouth daily.     No current facility-administered medications on file prior to visit.     REVIEW OF SYSTEMS:  no change from visit 1 week ago  PHYSICAL EXAMINATION:   Vital signs are  Filed Vitals:   05/11/15 1141  BP: 119/75  Pulse: 62  Height: 6' (1.829 m)  Weight: 192 lb 12.8 oz (87.454 kg)  SpO2: 98%   Body mass index is 26.14 kg/(m^2). General: The patient appears their stated age. HEENT:  No gross abnormalities Pulmonary:  Non labored breathing Abdomen: Soft and non-tender Musculoskeletal: There are no  major deformities. Neurologic: No focal weakness or paresthesias are detected, Skin: There are no ulcer or rashes noted. Psychiatric: The patient has normal affect.  palpable pedal pulses   Diagnostic Studies  I have reviewed CT angiogram which reveals a 5 cm infrarenal abdominal aortic aneurysm  Assessment:   abdominal aortic aneurysm Plan:  I discussed with the patient that I feel he is a candidate for endovascular repair.  We discussed the details of the procedure which included illustrations.  We discussed the risks and benefits of the procedure which include but are not limited to death, stroke, cardiac point complications, intestinal ischemia, renal ischemia and lower extremity ischemia.  He understands these and wishes to proceed.  His operation has been scheduled for Friday, February 3  V. Leia Alf, M.D. [facility] Office: [phone] Pager:  [phone]

## 2015-05-23 LAB — CBC
HCT: 37.6 % — ABNORMAL LOW (ref 39.0–52.0)
HEMOGLOBIN: 12.7 g/dL — AB (ref 13.0–17.0)
MCH: 29.5 pg (ref 26.0–34.0)
MCHC: 33.8 g/dL (ref 30.0–36.0)
MCV: 87.2 fL (ref 78.0–100.0)
PLATELETS: 181 10*3/uL (ref 150–400)
RBC: 4.31 MIL/uL (ref 4.22–5.81)
RDW: 13.1 % (ref 11.5–15.5)
WBC: 9.3 10*3/uL (ref 4.0–10.5)

## 2015-05-23 LAB — BASIC METABOLIC PANEL
ANION GAP: 11 (ref 5–15)
BUN: 8 mg/dL (ref 6–20)
CHLORIDE: 102 mmol/L (ref 101–111)
CO2: 27 mmol/L (ref 22–32)
Calcium: 9 mg/dL (ref 8.9–10.3)
Creatinine, Ser: 0.97 mg/dL (ref 0.61–1.24)
GFR calc Af Amer: >60 mL/min (ref >60)
GLUCOSE: 92 mg/dL (ref 65–99)
POTASSIUM: 3.7 mmol/L (ref 3.5–5.1)
Sodium: 140 mmol/L (ref 135–145)

## 2015-05-23 LAB — GLUCOSE, CAPILLARY: GLUCOSE-CAPILLARY: 138 mg/dL — AB (ref 65–99)

## 2015-05-23 LAB — HEMOGLOBIN A1C
HEMOGLOBIN A1C: 7.4 % — AB (ref 4.8–5.6)
MEAN PLASMA GLUCOSE: 166 mg/dL

## 2015-05-23 NOTE — Op Note (Signed)
Patient name: Jordan Ross MRN: [id number] DOB: 08-10-1945 Sex: male  05/22/2015 Pre-operative Diagnosis:   abdominal aortic aneurysm Post-operative diagnosis:  Same Surgeon:  Annamarie Major Assistants:   Gerri Lins Procedure:    #1: Endovascular repair of abdominal aortic aneurysm      #2: Bilateral ultrasound-guided common femoral artery access      #3: proximal extension    #4: distal extension    #5: catheter aorta 2    #6: Abdominal aortogram Anesthesia:   Gen. Blood Loss:  See anesthesia record Specimens:   none  Findings:   Type I endoleak after initial repair necessitating a cough which resulted type I endoleak. There was a type II endoleak.  Devices used: Main body was a Dealer , primary [id number].  Ipsilateral left extension was a [id number].  Contralateral right extension was a [id number].  Proximal cuff was a Gore 32 x 4  Indications:   The patient has an enlarging abdominal aortic aneurysm.  He comes in today for endovascular repair  Procedure:  The patient was identified in the holding area and taken to [facility]  The patient was then placed supine on the table. general anesthesia was administered.  The patient was prepped and draped in the usual sterile fashion.  A time out was called and antibiotics were administered.   Ultrasound was used to evaluate bilateral common femoral arteries which are widely patent with minimal calcification.  A digital ultrasound image was acquired.  A #11 blade was used to make a skin nick. Bilateral common femoral artery was then cannulated under ultrasound guidance with an 18-gauge needle. A 035 wire was then advanced without resistance.  The subcutaneous tract was dilated with an 8 [place] dilator.  Provide devices were deployed at the 11:00 and 1:00 position bilaterally for pre-closure.  8 French sheaths were placed bilaterally. The patient was fully heparinized. Because of some bleeding around the cannulation site on the  right, a Amplatz superstiff wire was placed and a 12 French sheath was advanced into the aorta. Similarly on the  Left side, an Amplatz superstiff wire was placed and a 24 French sheath was advanced into the aorta.  With the pigtail on the right side, an abdominal aortogram was performed locating the renal arteries.  The main body device was prepared on the back table.  This was a [id number].  He was advanced to the 24 [place] sheath and positioned at the level of renal arteries and then deployed down to the contralateral gate.  A Omni flush catheter was used to cannulate the contralateral gate.  The cath was able to be freely rotated within the main body, confirming successful cannulation.  An Amplatz superstiff wire was then placed.  The image detector was rotated to a left anterior oblique and a retrograde injection through the sheath in the right groin was performed locating the right hypogastric artery.  The contralateral limb was then prepared on the back table.  This was a [id number].  He was advanced to the sheath positioned appropriately and deployed landing just proximal to the right hypogastric artery.  Next the remaining portion of the ipsilateral limb was deployed.  The image detector was rotated to a right anterior oblique position and a retrograde injection through the left sheath was performed locating the left hypogastric artery origin.  The ipsilateral extension was then prepared on the back table and advanced the sheath and positioned appropriate.  It was then deployed landing just proximal  to the left hypogastric artery.  This was a Gore12x10.  Next, the Q-50 balloon was used to mold the proximal and distal attachment sites as well as device overlap.  A completion arteriogram was then performed.  I felt there was a type I endoleak, most likely from completing of the proximal graft since it was slightly oversized given the conical neck.  In order to treat this, I selected a Gore 32 x 4.0 proximal  cuff.  This was deployed appropriately and then molded with the Q-50 balloon.  A completion arteriogram revealed resolution of the type I endoleak.  There was a late type II leak present.  After these findings, I was satisfied with the repair.  Bentson wires were placed bilaterally.  The sheaths were withdrawn and arteriotomy closure was completed by securing the remainder sleep deployed Pro-glide devices.  An additional pro-glide device was required on the right side.  50 mg of protamine was administered.  Hemostasis was satisfactory after holding pressure on both groins for 5 minutes.  The skin incisions were closed with 4-0 Vicryl followed by Dermabond.  The patient had palpable pedal pulses at the end of the case.  There were no immediate complications.   Disposition:  To PACU in stable condition.   Theotis Burrow, M.D. [facility] [place] Office: [phone] Pager:  [phone]

## 2015-05-23 NOTE — Progress Notes (Signed)
Discharge instructions given. No questions or concerns at this time. IVs d/c'd. Tip intact. Pt tolerated well. Pt d/c'd via family vehicle.

## 2015-05-23 NOTE — Progress Notes (Signed)
[facility] [place]  Subjective  - feels ok   Objective 105/60 69 98.3 F (36.8 C) (Oral) 17 97%  Intake/Output Summary (Last 24 hours) at 05/23/15 0716 Last data filed at 05/23/15 0600  Gross per 24 hour  Intake   1550 ml  Output   2130 ml  Net   -580 ml   Palpable pedal pulses No abdominal pain No groin hematoma Tolerating diet, voiding Assessment/Planning: Doing well s/p EVAR D/c home today  Ruta Hinds 05/23/2015 7:16 AM --  Laboratory Lab Results:  Recent Labs  05/22/15 1617 05/23/15 0508  WBC 8.0 9.3  HGB 12.3* 12.7*  HCT 36.4* 37.6*  PLT 186 181   BMET  Recent Labs  05/22/15 1300 05/22/15 1617 05/23/15 0508  NA 143  --  140  K 3.6  --  3.7  CL 110  --  102  CO2 26  --  27  GLUCOSE 94  --  92  BUN 11  --  8  CREATININE 0.91 0.88 0.97  CALCIUM 8.7*  --  9.0    COAG Lab Results  Component Value Date   INR 1.20 05/22/2015   INR 1.06 05/18/2015   No results found for: PTT

## 2015-05-25 ENCOUNTER — Other Ambulatory Visit: Payer: Self-pay | Admitting: *Deleted

## 2015-05-25 DIAGNOSIS — Z95828 Presence of other vascular implants and grafts: Secondary | ICD-10-CM

## 2015-05-25 DIAGNOSIS — I714 Abdominal aortic aneurysm, without rupture, unspecified: Secondary | ICD-10-CM

## 2015-05-25 NOTE — Anesthesia Postprocedure Evaluation (Signed)
Anesthesia Post Note  Patient: Jordan Ross  Procedure(s) Performed: Procedure(s) (LRB): ABDOMINAL AORTIC ENDOVASCULAR STENT GRAFT (N/A)  Patient location during evaluation: PACU Anesthesia Type: General Level of consciousness: awake Pain management: pain level controlled Vital Signs Assessment: post-procedure vital signs reviewed and stable Respiratory status: spontaneous breathing and respiratory function stable Cardiovascular status: stable Postop Assessment: no signs of nausea or vomiting Anesthetic complications: no    Last Vitals:  Filed Vitals:   05/23/15 0303 05/23/15 0737  BP: 105/60 100/71  Pulse: 69 79  Temp: 36.8 C   Resp: 17 20    Last Pain:  Filed Vitals:   05/23/15 0737  PainSc: Asleep                 Gavinn Collard

## 2015-05-26 ENCOUNTER — Encounter (HOSPITAL_COMMUNITY): Payer: Self-pay | Admitting: Surgery

## 2015-05-29 ENCOUNTER — Telehealth: Payer: Self-pay | Admitting: *Deleted

## 2015-05-29 NOTE — Telephone Encounter (Signed)
Order for perioperative prescription for implanted cardiac device faxed to [facility] 05/19/2015

## 2015-06-01 ENCOUNTER — Other Ambulatory Visit: Payer: Self-pay | Admitting: "Endocrinology

## 2015-06-04 NOTE — Discharge Summary (Signed)
[facility] Discharge Summary   Patient ID:  Jordan Ross MRN: [id number] DOB/AGE: 21-Jul-1945 70 y.o.  Admit date: 05/22/2015 Discharge date: 05/23/2015  Date of Surgery: 05/22/2015 Surgeon: Surgeon(s): Serafina Mitchell, MD  Admission Diagnosis: AAA  I71.4  Discharge Diagnoses:  AAA  I71.4  Secondary Diagnoses: Past Medical History  Diagnosis Date  . Status post coronary artery bypass grafting   . Ischemic cardiomyopathy   . Abdominal aortic aneurysm ([place])   . Hyperlipidemia     takes Atorvastatin and Niacin daily  . ICD (implantable cardioverter-defibrillator), single, in situ   . Coronary artery disease     AWMI 2001  . LV dysfunction     takes Digoxin daily  . GERD (gastroesophageal reflux disease)     takes Protonix daily  . Diverticulosis   . Anemia     iron deficiency anemia,takes iron pill daily  . Hypertension     takes Diovan,Spironolactone, and Carvedilol daily  . Myocardial infarction ([place]) 2001  . AICD (automatic cardioverter/defibrillator) present     St Jude  . Shortness of breath dyspnea     with exertion.States he was a smoker for yr  . Pneumonia 40 yrs ago    hx of  . History of migraine 40 yrs ago  . Arthritis   . Joint swelling   . History of colon polyps     benign  . Type 2 diabetes mellitus (HCC)     takes Glipizide,Metformin,Invokana,and Lantus daily.Average fasting blood sugar runs about 140  . History of shingles     Procedure(s): ABDOMINAL AORTIC ENDOVASCULAR STENT GRAFT  Discharged Condition: good  HPI:  The patient is back after having had CT scan to evaluate an abdominal aortic aneurysm. He initially presented with an ultrasound which revealed a 5.6 and meter aneurysm. He is asymptomatic.  A CT angiogram which reveals a 5 cm infrarenal abdominal aortic aneurysm.    The patient suffers coronary artery disease and is status post CABG in 2001. He is medically managed for hypercholesterolemia with a statin. His  blood pressure is controlled with an ARB. He suffers from diabetes which is poorly controlled. His hemoglobin A1c from 03/17/2015 was 7.4. He has a history of bilateral renal artery stenosis. He has a Data processing manager in place.   Hospital Course:  Jordan Ross is a 69 y.o. male is S/P  Procedure(s): ABDOMINAL AORTIC ENDOVASCULAR STENT GRAFT  POD#1 Palpable pedal pulses No abdominal pain No groin hematoma Tolerating diet, voiding Assessment/Planning: Doing well s/p EVAR D/c home today    Significant Diagnostic Studies: CBC Lab Results  Component Value Date   WBC 9.3 05/23/2015   HGB 12.7* 05/23/2015   HCT 37.6* 05/23/2015   MCV 87.2 05/23/2015   PLT 181 05/23/2015    BMET    Component Value Date/Time   NA 140 05/23/2015 0508   K 3.7 05/23/2015 0508   CL 102 05/23/2015 0508   CO2 27 05/23/2015 0508   GLUCOSE 92 05/23/2015 0508   BUN 8 05/23/2015 0508   CREATININE 0.97 05/23/2015 0508   CREATININE 0.90 05/07/2015 1200   CALCIUM 9.0 05/23/2015 0508   GFRNONAA >60 05/23/2015 0508   GFRAA >60 05/23/2015 0508   COAG Lab Results  Component Value Date   INR 1.20 05/22/2015   INR 1.06 05/18/2015     Disposition:  Discharge to :Home Discharge Instructions    Call MD for:  redness, tenderness, or signs of infection (pain, swelling, bleeding, redness, odor  or green/yellow discharge around incision site)    Complete by:  As directed      Call MD for:  severe or increased pain, loss or decreased feeling  in affected limb(s)    Complete by:  As directed      Call MD for:  temperature >100.5    Complete by:  As directed      Discharge instructions    Complete by:  As directed   You may shower daily     Driving Restrictions    Complete by:  As directed   No driving for 1 week     Increase activity slowly    Complete by:  As directed   Walk with assistance use walker or cane as needed     Lifting restrictions    Complete by:  As directed   No lifting  for 6 weeks     Resume previous diet    Complete by:  As directed             Medication List    TAKE these medications        aspirin 81 MG tablet  Take 81 mg by mouth daily.     atorvastatin 40 MG tablet  Commonly known as:  LIPITOR  Take 40 mg by mouth daily.     carvedilol 12.5 MG tablet  Commonly known as:  COREG  Take 12.5 mg by mouth 2 (two) times daily.     digoxin 0.125 MG tablet  Commonly known as:  LANOXIN  Take 125 mcg by mouth daily.     GLIPIZIDE XL 10 MG 24 hr tablet  Generic drug:  glipiZIDE  Take 1 tablet by mouth daily. Reported on 05/04/2015     INVOKANA 300 MG Tabs tablet  Generic drug:  canagliflozin  Take 300 mg by mouth daily before breakfast.     IRON PO  Take 65 mg by mouth daily.     LANTUS SOLOSTAR 100 UNIT/ML Solostar Pen  Generic drug:  Insulin Glargine  Inject 30 Units into the skin at bedtime.     metFORMIN 500 MG tablet  Commonly known as:  GLUCOPHAGE  Take 1,000 mg by mouth 2 (two) times daily with a meal.     niacin 500 MG CR tablet  Commonly known as:  NIASPAN  Take 500 mg by mouth daily.     oxyCODONE-acetaminophen 5-325 MG tablet  Commonly known as:  PERCOCET/ROXICET  Take 1 tablet by mouth every 6 (six) hours as needed.     pantoprazole 40 MG tablet  Commonly known as:  PROTONIX  Take 40 mg by mouth daily.     spironolactone 25 MG tablet  Commonly known as:  ALDACTONE  Take 25 mg by mouth daily.     valsartan 160 MG tablet  Commonly known as:  DIOVAN  Take 160 mg by mouth daily.       Verbal and written Discharge instructions given to the patient. Wound care per Discharge AVS     Follow-up Information    Follow up with Annamarie Major, MD In 4 weeks.   Specialties:  Vascular Surgery, Cardiology   Why:  Office will call you to arrange your appt (sent)   Contact information:   [place] [address] [phone]       Signed: Laurence Slate [facility] 06/04/2015, 1:20 PM  - For VQI Registry  use --- Instructions: Press F2 to tab through selections.  Delete question if not  applicable.   Post-op:  Time to Extubation: [x ] In OR, [ ]  < 12 hrs, [ ]  12-24 hrs, [ ]  >=24 hrs Vasopressors Req. Post-op: No MI: [ ]  No, [ ]  Troponin only, [ ]  EKG or Clinical New Arrhythmia: No CHF: No ICU Stay: 1 days Transfusion: No  If yes, 0 units given  Complications: Resp failure: [x ] none, [ ]  Pneumonia, [ ]  Ventilator Chg in renal function: [x ] none, [ ]  Inc. Cr > 0.5, [ ]  Temp. Dialysis, [ ]  Permanent dialysis Leg ischemia: [x ] No, [ ]  Yes, no Surgery needed, [ ]  Yes, Surgery needed, [ ]  Amputation Bowel ischemia: [x ] No, [ ]  Medical Rx, [ ]  Surgical Rx Wound complication: [x ] No, [ ]  Superficial separation/infection, [ ]  Return to OR Return to OR: No  Return to OR for bleeding: No Stroke: [x ] None, [ ]  Minor, [ ]  Major  Discharge medications: Statin use:  Yes ASA use:  Yes Plavix use:  No  for medical reason   Beta blocker use:  Yes

## 2015-06-16 ENCOUNTER — Ambulatory Visit (INDEPENDENT_AMBULATORY_CARE_PROVIDER_SITE_OTHER): Payer: Medicare Other | Admitting: *Deleted

## 2015-06-16 ENCOUNTER — Encounter: Payer: Self-pay | Admitting: Surgery

## 2015-06-16 DIAGNOSIS — Z95810 Presence of automatic (implantable) cardiac defibrillator: Secondary | ICD-10-CM

## 2015-06-16 DIAGNOSIS — I255 Ischemic cardiomyopathy: Secondary | ICD-10-CM

## 2015-06-16 NOTE — Progress Notes (Signed)
Remote ICD transmission.   

## 2015-06-19 ENCOUNTER — Other Ambulatory Visit: Payer: Self-pay | Admitting: "Endocrinology

## 2015-06-19 DIAGNOSIS — E1159 Type 2 diabetes mellitus with other circulatory complications: Secondary | ICD-10-CM | POA: Diagnosis not present

## 2015-06-20 LAB — BASIC METABOLIC PANEL
BUN: 16 mg/dL (ref 7–25)
CHLORIDE: 101 mmol/L (ref 98–110)
CO2: 23 mmol/L (ref 20–31)
Calcium: 10.5 mg/dL — ABNORMAL HIGH (ref 8.6–10.3)
Creat: 1.12 mg/dL (ref 0.70–1.25)
Glucose, Bld: 152 mg/dL — ABNORMAL HIGH (ref 65–99)
POTASSIUM: 4.6 mmol/L (ref 3.5–5.3)
Sodium: 138 mmol/L (ref 135–146)

## 2015-06-20 LAB — HEMOGLOBIN A1C
Hgb A1c MFr Bld: 7.3 % — ABNORMAL HIGH (ref <5.7)
MEAN PLASMA GLUCOSE: 163 mg/dL — AB (ref <117)

## 2015-06-22 ENCOUNTER — Other Ambulatory Visit: Payer: Self-pay | Admitting: *Deleted

## 2015-06-22 ENCOUNTER — Ambulatory Visit (INDEPENDENT_AMBULATORY_CARE_PROVIDER_SITE_OTHER): Payer: Medicare Other | Admitting: Surgery

## 2015-06-22 ENCOUNTER — Encounter: Payer: Self-pay | Admitting: Surgery

## 2015-06-22 VITALS — BP 98/74 | HR 78 | Temp 97.1°F | Resp 14 | Ht 72.0 in | Wt 186.0 lb

## 2015-06-22 DIAGNOSIS — I714 Abdominal aortic aneurysm, without rupture, unspecified: Secondary | ICD-10-CM

## 2015-06-22 DIAGNOSIS — I6523 Occlusion and stenosis of bilateral carotid arteries: Secondary | ICD-10-CM

## 2015-06-22 NOTE — Progress Notes (Signed)
Patient name: Jordan Ross MRN: [id number] DOB: Aug 19, 1945 Sex: male     Chief Complaint  Patient presents with  . AAA    05-22-15 EVAR follow up     HISTORY OF PRESENT ILLNESS:  the patient is back for follow-up.  He is status post endovascular aneurysm repair on 05/22/2014. His postoperative course was uncomplicated.  He has no complaints today.  Past Medical History  Diagnosis Date  . Status post coronary artery bypass grafting   . Ischemic cardiomyopathy   . Abdominal aortic aneurysm ([place])   . Hyperlipidemia     takes Atorvastatin and Niacin daily  . ICD (implantable cardioverter-defibrillator), single, in situ   . Coronary artery disease     AWMI 2001  . LV dysfunction     takes Digoxin daily  . GERD (gastroesophageal reflux disease)     takes Protonix daily  . Diverticulosis   . Anemia     iron deficiency anemia,takes iron pill daily  . Hypertension     takes Diovan,Spironolactone, and Carvedilol daily  . Myocardial infarction ([place]) 2001  . AICD (automatic cardioverter/defibrillator) present     St Jude  . Shortness of breath dyspnea     with exertion.States he was a smoker for yr  . Pneumonia 40 yrs ago    hx of  . History of migraine 40 yrs ago  . Arthritis   . Joint swelling   . History of colon polyps     benign  . Type 2 diabetes mellitus (HCC)     takes Glipizide,Metformin,Invokana,and Lantus daily.Average fasting blood sugar runs about 140  . History of shingles     Past Surgical History  Procedure Laterality Date  . Colonoscopy N/A 03/21/2014    Procedure: COLONOSCOPY;  Surgeon: Rogene Houston, MD;  Location: [facility];  Service: Endoscopy;  Laterality: N/A;  855 - moved to 12/4 @ 8:30 - Ann notified pt  . Doppler echocardiography      2011 38% EF.2013 EF 30-35%  . Coronary artery bypass graft  2001    unsure if it was 3 or 4  . Cardiac catheterization  2001  . Abdominal aortic endovascular stent graft N/A 05/22/2015    Procedure:  ABDOMINAL AORTIC ENDOVASCULAR STENT GRAFT;  Surgeon: Serafina Mitchell, MD;  Location: [facility] OR;  Service: Vascular;  Laterality: N/A;    Social History   Social History  . Marital Status: Widowed    Spouse Name: N/A  . Number of Children: N/A  . Years of Education: N/A   Occupational History  . Not on file.   Social History Main Topics  . Smoking status: Former Smoker -- 40 years  . Smokeless tobacco: Never Used     Comment: quit smoking in 2001  . Alcohol Use: No  . Drug Use: No  . Sexual Activity: Yes   Other Topics Concern  . Not on file   Social History Narrative    Family History  Problem Relation Age of Onset  . COPD Mother   . Heart disease Brother     Allergies as of 06/22/2015  . (No Known Allergies)    Current Outpatient Prescriptions on File Prior to Visit  Medication Sig Dispense Refill  . aspirin 81 MG tablet Take 81 mg by mouth daily.     [name] atorvastatin (LIPITOR) 40 MG tablet Take 40 mg by mouth daily.    . carvedilol (COREG) 12.5 MG tablet Take 12.5 mg by mouth  2 (two) times daily.    . digoxin (LANOXIN) 0.125 MG tablet Take 125 mcg by mouth daily.    [name] EASY TOUCH PEN NEEDLES 31G X 8 MM MISC USE AT BEDTIME AS DIRECTED. 50 each 2  . Insulin Glargine (LANTUS SOLOSTAR) 100 UNIT/ML Solostar Pen Inject 30 Units into the skin at bedtime.    . INVOKANA 300 MG TABS tablet Take 300 mg by mouth daily before breakfast.     . IRON PO Take 65 mg by mouth daily.    . metFORMIN (GLUCOPHAGE) 500 MG tablet Take 1,000 mg by mouth 2 (two) times daily with a meal.     . niacin (NIASPAN) 500 MG CR tablet Take 500 mg by mouth daily.    [name] oxyCODONE-acetaminophen (PERCOCET/ROXICET) 5-325 MG tablet Take 1 tablet by mouth every 6 (six) hours as needed. 30 tablet 0  . pantoprazole (PROTONIX) 40 MG tablet Take 40 mg by mouth daily.    [name] spironolactone (ALDACTONE) 25 MG tablet Take 25 mg by mouth daily.    . valsartan (DIOVAN) 160 MG tablet Take 160 mg by mouth daily.    [name]  GLIPIZIDE XL 10 MG 24 hr tablet Take 1 tablet by mouth daily. Reported on 06/22/2015     No current facility-administered medications on file prior to visit.      PHYSICAL EXAMINATION:   Vital signs are  Filed Vitals:   06/22/15 1214  BP: 98/74  Pulse: 78  Temp: 97.1 F (36.2 C)  TempSrc: Oral  Resp: 14  Height: 6' (1.829 m)  Weight: 186 lb (84.369 kg)  SpO2: 98%   Body mass index is 25.22 kg/(m^2). General: The patient appears their stated age.  abdomen soft.  Both groin incisions are completely healed  Diagnostic Studies  unfortunately, the patient did not get scheduled for his CT scan  Assessment:  status post endovascular aneurysm repair Plan:  the patient will get a CT angiogram to evaluate his repair.  I'll contact him with results. He will need follow-up in 6 months.  If there is an endoleak on CT scan, he will get a repeat CT angiogram.  If there is no evidence of endoleak, I will have him follow-up with ultrasound.  I'm also getting carotid Doppler studies  at his next visit.    Eldridge Abrahams, M.D. [facility] [place] Office: [phone] Pager:  [phone]

## 2015-06-24 ENCOUNTER — Ambulatory Visit
Admission: RE | Admit: 2015-06-24 | Discharge: 2015-06-24 | Disposition: A | Discharge status: Home / Self Care | Payer: Medicare Other | Source: Ambulatory Visit | Attending: Surgery | Admitting: Surgery

## 2015-06-24 DIAGNOSIS — I714 Abdominal aortic aneurysm, without rupture, unspecified: Secondary | ICD-10-CM

## 2015-06-24 DIAGNOSIS — Z95828 Presence of other vascular implants and grafts: Secondary | ICD-10-CM

## 2015-06-24 MED ORDER — IOPAMIDOL (ISOVUE-370) INJECTION 76%
100.0000 mL | Freq: Once | INTRAVENOUS | Status: AC | PRN
  Administered 2015-06-24: 100 mL via INTRAVENOUS

## 2015-06-25 ENCOUNTER — Encounter: Payer: Self-pay | Admitting: "Endocrinology

## 2015-06-25 ENCOUNTER — Encounter: Payer: Medicare Other | Attending: Internal Medicine | Admitting: Nutrition

## 2015-06-25 ENCOUNTER — Encounter: Payer: Self-pay | Admitting: Nutrition

## 2015-06-25 ENCOUNTER — Ambulatory Visit (INDEPENDENT_AMBULATORY_CARE_PROVIDER_SITE_OTHER): Payer: Medicare Other | Admitting: "Endocrinology

## 2015-06-25 VITALS — Ht 72.0 in | Wt 186.0 lb

## 2015-06-25 VITALS — BP 95/64 | HR 64 | Ht 72.0 in | Wt 186.0 lb

## 2015-06-25 DIAGNOSIS — E1159 Type 2 diabetes mellitus with other circulatory complications: Secondary | ICD-10-CM

## 2015-06-25 DIAGNOSIS — E785 Hyperlipidemia, unspecified: Secondary | ICD-10-CM

## 2015-06-25 DIAGNOSIS — E1165 Type 2 diabetes mellitus with hyperglycemia: Secondary | ICD-10-CM

## 2015-06-25 DIAGNOSIS — E118 Type 2 diabetes mellitus with unspecified complications: Secondary | ICD-10-CM | POA: Insufficient documentation

## 2015-06-25 DIAGNOSIS — I255 Ischemic cardiomyopathy: Secondary | ICD-10-CM

## 2015-06-25 DIAGNOSIS — IMO0002 Reserved for concepts with insufficient information to code with codable children: Secondary | ICD-10-CM

## 2015-06-25 DIAGNOSIS — Z713 Dietary counseling and surveillance: Secondary | ICD-10-CM | POA: Insufficient documentation

## 2015-06-25 DIAGNOSIS — I10 Essential (primary) hypertension: Secondary | ICD-10-CM | POA: Diagnosis not present

## 2015-06-25 MED ORDER — GLUCOSE BLOOD VI STRP: ORAL_STRIP | Status: AC

## 2015-06-25 NOTE — Progress Notes (Signed)
Diabetes Self-Management Education  Visit Type:  Follow-up  Appt. Start Time: 1430 Appt. End Time: 1500  06/25/2015  [name], identified by name and date of birth, is a 70 y.o. male with a diagnosis of Diabetes: Type 2.  He is doing very well. Following diet accurately. BS log brought in. A1C 7.3%. ASSESSMENT  Height 6' (1.829 m), weight 186 lb (84.369 kg). Body mass index is 25.22 kg/(m^2).       Diabetes Self-Management Education - 06/25/15 1445    Health Coping   How would you rate your overall health? Good   Psychosocial Assessment   Patient Belief/Attitude about Diabetes Motivated to manage diabetes   Self-care barriers None   Self-management support Family;Friends   Patient Concerns Glycemic Control;Weight Control;Healthy Lifestyle   Special Needs None   Preferred Learning Style No preference indicated   Learning Readiness Change in progress   Complications   How often do you check your blood sugar? 1-2 times/day   Fasting Blood glucose range (mg/dL) 70-129   Postprandial Blood glucose range (mg/dL) 130-179   Number of hypoglycemic episodes per month 0   Number of hyperglycemic episodes per week 0   Can you tell when your blood sugar is high? Yes   What do you do if your blood sugar is high? drink water   Have you had a dilated eye exam in the past 12 months? Yes   Have you had a dental exam in the past 12 months? Yes   Are you checking your feet? Yes   How many days per week are you checking your feet? 7   Dietary Intake   Breakfast egg, toast, and sausage occassionally. water   Lunch Tomato sandwich on wheat bread, apple,  water   Dinner Boiled potatoes, beans, baked beans, tomatotes, slaw,    Beverage(s) water   Exercise   How many days per week to you exercise? 5   How many minutes per day do you exercise? 30   Total minutes per week of exercise 150   Patient Education   Previous Diabetes Education Yes (please comment)   Disease state  Explored  patient's options for treatment of their diabetes   Nutrition management  Carbohydrate counting;Meal options for control of blood glucose level and chronic complications.;Meal timing in regards to the patients' current diabetes medication.   Physical activity and exercise  Helped patient identify appropriate exercises in relation to his/her diabetes, diabetes complications and other health issue.   Medications Reviewed medication adjustment guidelines for hyperglycemia and sick days.   Monitoring Identified appropriate SMBG and/or A1C goals.   Acute complications Discussed and identified patients' treatment of hyperglycemia.;Taught treatment of hypoglycemia - the 15 rule.   Chronic complications Nephropathy, what it is, prevention of, the use of ACE, ARB's and early detection of through urine microalbumia.;Reviewed with patient heart disease, higher risk of, and prevention   Psychosocial adjustment Role of stress on diabetes;Identified and addressed patients feelings and concerns about diabetes   Personal strategies to promote health Helped patient develop diabetes management plan for (enter comment)   Individualized Goals (developed by patient)   Nutrition Follow meal plan discussed;Adjust meds/carbs with exercise as discussed   Physical Activity Exercise 5-7 days per week   Medications take my medication as prescribed   Monitoring  test my blood glucose as discussed   Reducing Risk do foot checks daily;increase portions of healthy fats   Patient Self-Evaluation of Goals - Patient rates self as meeting previously  set goals (% of time)   Nutrition >75%   Physical Activity >75%   Medications >75%   Monitoring >75%   Problem Solving >75%   Reducing Risk >75%   Health Coping >75%   Outcomes   Program Status Completed   Subsequent Visit   Since your last visit have you continued or begun to take your medications as prescribed? Yes   Since your last visit have you had your blood pressure  checked? Yes   Is your most recent blood pressure lower, unchanged, or higher since your last visit? Lower   Weight Loss (lbs) 3   Since your last visit, are you checking your blood glucose at least once a day? Yes      Learning Objective:  Patient will have a greater understanding of diabetes self-management. Patient education plan is to attend individual and/or group sessions per assessed needs and concerns.   Plan:   Goals: Keep up the good work! Eat three meals per day 45-60 grams of carbs per meal. Avoid snacks between meals unless a low blood sugar.. Exercise 30 minutes 5 days per week. Get A1C down below 7% in 3-6 months. Call if any questions.   Expected Outcomes:  Demonstrated interest in learning. Expect positive outcomes  Education material provided: My Plate and Carbohydrate counting sheet  If problems or questions, patient to contact team via:  Phone  Future DSME appointment: - PRN

## 2015-06-25 NOTE — Progress Notes (Signed)
Subjective:    Patient ID: Jordan Ross, male    DOB: 08-25-1945, PCP Glo Herring., MD   Past Medical History  Diagnosis Date  . Status post coronary artery bypass grafting   . Ischemic cardiomyopathy   . Abdominal aortic aneurysm ([place])   . Hyperlipidemia     takes Atorvastatin and Niacin daily  . ICD (implantable cardioverter-defibrillator), single, in situ   . Coronary artery disease     AWMI 2001  . LV dysfunction     takes Digoxin daily  . GERD (gastroesophageal reflux disease)     takes Protonix daily  . Diverticulosis   . Anemia     iron deficiency anemia,takes iron pill daily  . Hypertension     takes Diovan,Spironolactone, and Carvedilol daily  . Myocardial infarction ([place]) 2001  . AICD (automatic cardioverter/defibrillator) present     St Jude  . Shortness of breath dyspnea     with exertion.States he was a smoker for yr  . Pneumonia 40 yrs ago    hx of  . History of migraine 40 yrs ago  . Arthritis   . Joint swelling   . History of colon polyps     benign  . Type 2 diabetes mellitus (HCC)     takes Glipizide,Metformin,Invokana,and Lantus daily.Average fasting blood sugar runs about 140  . History of shingles    Past Surgical History  Procedure Laterality Date  . Colonoscopy N/A 03/21/2014    Procedure: COLONOSCOPY;  Surgeon: Rogene Houston, MD;  [facility];  Service: Endoscopy;  Laterality: N/A;  855 - moved to 12/4 @ 8:30 - Ann notified pt  . Doppler echocardiography      2011 38% EF.2013 EF 30-35%  . Coronary artery bypass graft  2001    unsure if it was 3 or 4  . Cardiac catheterization  2001  . Abdominal aortic endovascular stent graft N/A 05/22/2015    Procedure: ABDOMINAL AORTIC ENDOVASCULAR STENT GRAFT;  Surgeon: Serafina Mitchell, MD;  Location: [facility] OR;  Service: Vascular;  Laterality: N/A;   Social History   Social History  . Marital Status: Widowed    Spouse Name: N/A  . Number of Children: N/A  . Years of Education:  N/A   Social History Main Topics  . Smoking status: Former Smoker -- 40 years  . Smokeless tobacco: Never Used     Comment: quit smoking in 2001  . Alcohol Use: No  . Drug Use: No  . Sexual Activity: Yes   Other Topics Concern  . None   Social History Narrative   Outpatient Encounter Prescriptions as of 06/25/2015  Medication Sig  . aspirin 81 MG tablet Take 81 mg by mouth daily.   [name] atorvastatin (LIPITOR) 40 MG tablet Take 40 mg by mouth daily.  . carvedilol (COREG) 12.5 MG tablet Take 12.5 mg by mouth 2 (two) times daily.  . digoxin (LANOXIN) 0.125 MG tablet Take 125 mcg by mouth daily.  [name] EASY TOUCH PEN NEEDLES 31G X 8 MM MISC USE AT BEDTIME AS DIRECTED.  [name] glucose blood test strip Use as instructed  . Insulin Glargine (LANTUS SOLOSTAR) 100 UNIT/ML Solostar Pen Inject 30 Units into the skin at bedtime.  . INVOKANA 300 MG TABS tablet Take 300 mg by mouth daily before breakfast.   . IRON PO Take 65 mg by mouth daily.  . metFORMIN (GLUCOPHAGE) 500 MG tablet Take 1,000 mg by mouth 2 (two) times daily with a  meal.   . niacin (NIASPAN) 500 MG CR tablet Take 500 mg by mouth daily.  [name] oxyCODONE-acetaminophen (PERCOCET/ROXICET) 5-325 MG tablet Take 1 tablet by mouth every 6 (six) hours as needed.  . pantoprazole (PROTONIX) 40 MG tablet Take 40 mg by mouth daily.  [name] spironolactone (ALDACTONE) 25 MG tablet Take 25 mg by mouth daily.  . valsartan (DIOVAN) 160 MG tablet Take 160 mg by mouth daily.  . [DISCONTINUED] GLIPIZIDE XL 10 MG 24 hr tablet Take 1 tablet by mouth daily. Reported on 06/25/2015   No facility-administered encounter medications on file as of 06/25/2015.   ALLERGIES: No Known Allergies VACCINATION STATUS:  There is no immunization history on file for this patient.  Diabetes He presents for his follow-up diabetic visit. He has type 2 diabetes mellitus. Onset time: He was diagnosed at approximate age of 45 years. His disease course has been improving. There are no  hypoglycemic associated symptoms. Pertinent negatives for hypoglycemia include no confusion, headaches, pallor or seizures. There are no diabetic associated symptoms. Pertinent negatives for diabetes include no chest pain, no fatigue, no polydipsia, no polyphagia, no polyuria and no weakness. There are no hypoglycemic complications. Symptoms are improving. Diabetic complications include heart disease. Risk factors for coronary artery disease include diabetes mellitus, dyslipidemia, hypertension, male sex, sedentary lifestyle and tobacco exposure. Current diabetic treatment includes insulin injections and oral agent (dual therapy). He is compliant with treatment most of the time. His weight is stable. He is following a generally unhealthy diet. He has had a previous visit with a dietitian. His home blood glucose trend is decreasing steadily. His overall blood glucose range is 140-180 mg/dl. An ACE inhibitor/angiotensin II receptor blocker is being taken. Eye exam is not current.  Hyperlipidemia This is a chronic problem. The current episode started more than 1 year ago. Pertinent negatives include no chest pain, myalgias or shortness of breath. Current antihyperlipidemic treatment includes statins.  Hypertension This is a chronic problem. The current episode started more than 1 year ago. Pertinent negatives include no chest pain, headaches, neck pain, palpitations or shortness of breath. Risk factors for coronary artery disease include dyslipidemia. Past treatments include ACE inhibitors.     Review of Systems  Constitutional: Negative for fever, chills, fatigue and unexpected weight change.  HENT: Negative for dental problem, mouth sores and trouble swallowing.   Eyes: Negative for visual disturbance.  Respiratory: Negative for cough, choking, chest tightness, shortness of breath and wheezing.   Cardiovascular: Negative for chest pain, palpitations and leg swelling.  Gastrointestinal: Negative for  nausea, vomiting, abdominal pain, diarrhea, constipation and abdominal distention.  Endocrine: Negative for polydipsia, polyphagia and polyuria.  Genitourinary: Negative for dysuria, urgency, hematuria and flank pain.  Musculoskeletal: Negative for myalgias, back pain, gait problem and neck pain.  Skin: Negative for pallor, rash and wound.  Neurological: Negative for seizures, syncope, weakness, numbness and headaches.  Psychiatric/Behavioral: Negative.  Negative for confusion and dysphoric mood.    Objective:    BP 95/64 mmHg  Pulse 64  Ht 6' (1.829 m)  Wt 186 lb (84.369 kg)  BMI 25.22 kg/m2  SpO2 97%  Wt Readings from Last 3 Encounters:  06/25/15 186 lb (84.369 kg)  06/25/15 186 lb (84.369 kg)  06/22/15 186 lb (84.369 kg)    Physical Exam  Constitutional: He is oriented to person, place, and time. He appears well-developed and well-nourished. He is cooperative. No distress.  HENT:  Head: Normocephalic and atraumatic.  Eyes: EOM are normal.  Neck:  Normal range of motion. Neck supple. No tracheal deviation present. No thyromegaly present.  Cardiovascular: Normal rate, S1 normal, S2 normal and normal heart sounds.  Exam reveals no gallop.   No murmur heard. Pulses:      Dorsalis pedis pulses are 1+ on the right side, and 1+ on the left side.       Posterior tibial pulses are 1+ on the right side, and 1+ on the left side.  Pulmonary/Chest: Breath sounds normal. No respiratory distress. He has no wheezes.  Abdominal: Soft. Bowel sounds are normal. He exhibits no distension. There is no tenderness. There is no guarding and no CVA tenderness.  Musculoskeletal: He exhibits no edema.       Right shoulder: He exhibits no swelling and no deformity.  Neurological: He is alert and oriented to person, place, and time. He has normal strength and normal reflexes. No cranial nerve deficit or sensory deficit. Gait normal.  Skin: Skin is warm and dry. No rash noted. No cyanosis. Nails show no  clubbing.  Psychiatric: He has a normal mood and affect. His speech is normal and behavior is normal. Judgment and thought content normal. Cognition and memory are normal.    Results for orders placed or performed in visit on [id number]  Basic metabolic panel  Result Value Ref Range   Sodium 138 135 - 146 mmol/L   Potassium 4.6 3.5 - 5.3 mmol/L   Chloride 101 98 - 110 mmol/L   CO2 23 20 - 31 mmol/L   Glucose, Bld 152 (H) 65 - 99 mg/dL   BUN 16 7 - 25 mg/dL   Creat 1.12 0.70 - 1.25 mg/dL   Calcium 10.5 (H) 8.6 - 10.3 mg/dL  Hemoglobin A1c  Result Value Ref Range   Hgb A1c MFr Bld 7.3 (H) <5.7 %   Mean Plasma Glucose 163 (H) <117 mg/dL   Complete Blood Count (Most recent): Lab Results  Component Value Date   WBC 9.3 05/23/2015   HGB 12.7* 05/23/2015   HCT 37.6* 05/23/2015   MCV 87.2 05/23/2015   PLT 181 05/23/2015   Chemistry (most recent): Lab Results  Component Value Date   NA 138 06/19/2015   K 4.6 06/19/2015   CL 101 06/19/2015   CO2 23 06/19/2015   BUN 16 06/19/2015   CREATININE 1.12 06/19/2015   Diabetic Labs (most recent): Lab Results  Component Value Date   HGBA1C 7.3* 06/19/2015   HGBA1C 7.4* 05/22/2015   HGBA1C 7.4* 05/18/2015      Assessment & Plan:   1. Type 2 diabetes mellitus with vascular disease (HCC)  - His diabetes is  complicated by coronary artery disease and patient remains at a high risk for more acute and chronic complications of diabetes which include CAD, CVA, CKD, retinopathy, and neuropathy. These are all discussed in detail with the patient.  Patient came with improved glucose profile, and  recent A1c of 7.3 %.  Glucose logs and insulin administration records pertaining to this visit,  to be scanned into patient's records.  Recent labs reviewed.   - I have re-counseled the patient on diet management and weight loss  by adopting a carbohydrate restricted / protein rich  Diet.  - Suggestion is made for patient to avoid simple  carbohydrates   from their diet including Cakes , Desserts, Ice Cream,  Soda (  diet and regular) , Sweet Tea , Candies,  Chips, Cookies, Artificial Sweeteners,   and "Sugar-free" Products .  This will help  patient to have stable blood glucose profile and potentially avoid unintended  Weight gain.  - Patient is advised to stick to a routine mealtimes to eat 3 meals  a day and avoid unnecessary snacks ( to snack only to correct hypoglycemia).  - The patient  will be  scheduled with Jearld Fenton, RDN, CDE for individualized DM education.  - I have approached patient with the following individualized plan to manage diabetes and patient agrees.   -He will continue MTF 1gm po BID and Invokana 300 mg po qday. I will continue  Lantus to 30 units qhs associated with monitoring BG  BEFORE  BREAKFAST .  - Patient specific target  for A1c; LDL, HDL, Triglycerides, and  Waist Circumference were discussed in detail.  2) BP/HTN:  Controlled. Continue current medications including ACEI/ARB. 3) Lipids/HPL:  continue statins. 4)  Weight/Diet: CDE consult in progress, exercise, and carbohydrates information provided.  5) Chronic Care/Health Maintenance:  -Patient  is on ACEI/ARB and Statin medications and encouraged to continue to follow up with Ophthalmology, Podiatrist at least yearly or according to recommendations, and advised to  stay away from smoking. I have recommended yearly flu vaccine and pneumonia vaccination at least every 5 years; moderate intensity exercise for up to 150 minutes weekly; and  sleep for at least 7 hours a day.  - 25 minutes of time was spent on the care of this patient , 50% of which was applied for counseling on diabetes complications and their preventions.  - I advised patient to maintain close follow up with Glo Herring., MD for primary care needs.  Patient is asked to bring meter and  blood glucose logs during their next visit.   Follow up plan: -Return in about 3  months (around 09/25/2015) for diabetes, high blood pressure, high cholesterol, follow up with pre-visit labs, meter, and logs.  Glade Lloyd, MD Phone: [phone]  Fax: [phone]   06/25/2015, 4:42 PM

## 2015-06-25 NOTE — Progress Notes (Signed)
d 

## 2015-06-25 NOTE — Patient Instructions (Signed)

## 2015-06-25 NOTE — Patient Instructions (Signed)
  Goals: Keep up the good work! Eat three meals per day 45-60 grams of carbs per meal. Avoid snacks between meals unless a low blood sugar.. Exercise 30 minutes 5 days per week. Get A1C down below 7% in 3-6 months. Call if any questions.

## 2015-07-17 ENCOUNTER — Encounter: Payer: Self-pay | Admitting: Cardiology

## 2015-07-17 LAB — CUP PACEART REMOTE DEVICE CHECK
Battery Remaining Percentage: 52 %
Battery Voltage: 2.93 V
HIGH POWER IMPEDANCE MEASURED VALUE: 48 Ohm
Lead Channel Impedance Value: 530 Ohm
Lead Channel Pacing Threshold Amplitude: 0.75 V
Lead Channel Setting Pacing Amplitude: 2.5 V
Lead Channel Setting Sensing Sensitivity: 0.5 mV
MDC IDC LEAD IMPLANT DT: 20101028
MDC IDC LEAD LOCATION: 753860
MDC IDC MSMT BATTERY REMAINING LONGEVITY: 62 mo
MDC IDC MSMT LEADCHNL RV PACING THRESHOLD PULSEWIDTH: 0.4 ms
MDC IDC MSMT LEADCHNL RV SENSING INTR AMPL: 12 mV
MDC IDC PG SERIAL: 737526
MDC IDC SESS DTM: 20170228073238
MDC IDC SET LEADCHNL RV PACING PULSEWIDTH: 0.4 ms
MDC IDC STAT BRADY RV PERCENT PACED: 1 %

## 2015-08-03 ENCOUNTER — Encounter: Payer: Self-pay | Admitting: Cardiology

## 2015-09-02 DIAGNOSIS — Z7982 Long term (current) use of aspirin: Secondary | ICD-10-CM | POA: Diagnosis not present

## 2015-09-02 DIAGNOSIS — S4992XA Unspecified injury of left shoulder and upper arm, initial encounter: Secondary | ICD-10-CM | POA: Diagnosis not present

## 2015-09-02 DIAGNOSIS — S3992XA Unspecified injury of lower back, initial encounter: Secondary | ICD-10-CM | POA: Diagnosis not present

## 2015-09-02 DIAGNOSIS — S99912A Unspecified injury of left ankle, initial encounter: Secondary | ICD-10-CM | POA: Diagnosis not present

## 2015-09-02 DIAGNOSIS — M7989 Other specified soft tissue disorders: Secondary | ICD-10-CM | POA: Diagnosis not present

## 2015-09-02 DIAGNOSIS — Z79899 Other long term (current) drug therapy: Secondary | ICD-10-CM | POA: Diagnosis not present

## 2015-09-02 DIAGNOSIS — M545 Low back pain: Secondary | ICD-10-CM | POA: Diagnosis not present

## 2015-09-02 DIAGNOSIS — Z7984 Long term (current) use of oral hypoglycemic drugs: Secondary | ICD-10-CM | POA: Diagnosis not present

## 2015-09-02 DIAGNOSIS — M5136 Other intervertebral disc degeneration, lumbar region: Secondary | ICD-10-CM | POA: Diagnosis not present

## 2015-09-02 DIAGNOSIS — W11XXXA Fall on and from ladder, initial encounter: Secondary | ICD-10-CM | POA: Diagnosis not present

## 2015-09-02 DIAGNOSIS — S92002A Unspecified fracture of left calcaneus, initial encounter for closed fracture: Secondary | ICD-10-CM | POA: Diagnosis not present

## 2015-09-02 DIAGNOSIS — M25512 Pain in left shoulder: Secondary | ICD-10-CM | POA: Diagnosis not present

## 2015-09-02 DIAGNOSIS — S42255A Nondisplaced fracture of greater tuberosity of left humerus, initial encounter for closed fracture: Secondary | ICD-10-CM | POA: Diagnosis not present

## 2015-09-03 ENCOUNTER — Other Ambulatory Visit: Payer: Self-pay | Admitting: "Endocrinology

## 2015-09-07 DIAGNOSIS — S42255A Nondisplaced fracture of greater tuberosity of left humerus, initial encounter for closed fracture: Secondary | ICD-10-CM | POA: Diagnosis not present

## 2015-09-07 DIAGNOSIS — M79672 Pain in left foot: Secondary | ICD-10-CM | POA: Diagnosis not present

## 2015-09-07 DIAGNOSIS — S92012A Displaced fracture of body of left calcaneus, initial encounter for closed fracture: Secondary | ICD-10-CM | POA: Diagnosis not present

## 2015-09-07 DIAGNOSIS — M79622 Pain in left upper arm: Secondary | ICD-10-CM | POA: Diagnosis not present

## 2015-09-15 ENCOUNTER — Ambulatory Visit (INDEPENDENT_AMBULATORY_CARE_PROVIDER_SITE_OTHER): Payer: Medicare Other | Admitting: *Deleted

## 2015-09-15 DIAGNOSIS — Z95810 Presence of automatic (implantable) cardiac defibrillator: Secondary | ICD-10-CM | POA: Diagnosis not present

## 2015-09-15 DIAGNOSIS — I255 Ischemic cardiomyopathy: Secondary | ICD-10-CM | POA: Diagnosis not present

## 2015-09-16 NOTE — Progress Notes (Signed)
Remote ICD transmission.   

## 2015-09-28 DIAGNOSIS — S92012A Displaced fracture of body of left calcaneus, initial encounter for closed fracture: Secondary | ICD-10-CM | POA: Diagnosis not present

## 2015-09-28 DIAGNOSIS — S42255A Nondisplaced fracture of greater tuberosity of left humerus, initial encounter for closed fracture: Secondary | ICD-10-CM | POA: Diagnosis not present

## 2015-09-30 ENCOUNTER — Ambulatory Visit: Payer: Medicare Other | Admitting: "Endocrinology

## 2015-10-01 LAB — CUP PACEART REMOTE DEVICE CHECK
Date Time Interrogation Session: 20170530080002
HighPow Impedance: 41 Ohm
Implantable Lead Implant Date: 20101028
Implantable Lead Location: 753860
Lead Channel Pacing Threshold Amplitude: 0.75 V
Lead Channel Pacing Threshold Pulse Width: 0.4 ms
Lead Channel Sensing Intrinsic Amplitude: 11.9 mV
Lead Channel Setting Pacing Pulse Width: 0.4 ms
MDC IDC MSMT BATTERY REMAINING LONGEVITY: 60 mo
MDC IDC MSMT BATTERY REMAINING PERCENTAGE: 50 %
MDC IDC MSMT BATTERY VOLTAGE: 2.93 V
MDC IDC MSMT LEADCHNL RV IMPEDANCE VALUE: 430 Ohm
MDC IDC PG SERIAL: 737526
MDC IDC SET LEADCHNL RV PACING AMPLITUDE: 2.5 V
MDC IDC SET LEADCHNL RV SENSING SENSITIVITY: 0.5 mV
MDC IDC STAT BRADY RV PERCENT PACED: 1 %

## 2015-10-06 ENCOUNTER — Encounter: Payer: Self-pay | Admitting: Cardiology

## 2015-11-03 DIAGNOSIS — E663 Overweight: Secondary | ICD-10-CM | POA: Diagnosis not present

## 2015-11-03 DIAGNOSIS — E1129 Type 2 diabetes mellitus with other diabetic kidney complication: Secondary | ICD-10-CM | POA: Diagnosis not present

## 2015-11-03 DIAGNOSIS — Z125 Encounter for screening for malignant neoplasm of prostate: Secondary | ICD-10-CM | POA: Diagnosis not present

## 2015-11-03 DIAGNOSIS — E782 Mixed hyperlipidemia: Secondary | ICD-10-CM | POA: Diagnosis not present

## 2015-11-03 DIAGNOSIS — Z1389 Encounter for screening for other disorder: Secondary | ICD-10-CM | POA: Diagnosis not present

## 2015-11-03 DIAGNOSIS — Z6826 Body mass index (BMI) 26.0-26.9, adult: Secondary | ICD-10-CM | POA: Diagnosis not present

## 2015-11-03 DIAGNOSIS — I10 Essential (primary) hypertension: Secondary | ICD-10-CM | POA: Diagnosis not present

## 2015-11-03 DIAGNOSIS — E039 Hypothyroidism, unspecified: Secondary | ICD-10-CM | POA: Diagnosis not present

## 2015-11-03 DIAGNOSIS — E118 Type 2 diabetes mellitus with unspecified complications: Secondary | ICD-10-CM | POA: Diagnosis not present

## 2015-11-03 DIAGNOSIS — K219 Gastro-esophageal reflux disease without esophagitis: Secondary | ICD-10-CM | POA: Diagnosis not present

## 2015-11-03 LAB — LIPID PANEL
Cholesterol: 165 mg/dL (ref 0–200)
HDL: 46 mg/dL (ref 35–70)
LDL CALC: 83 mg/dL
TRIGLYCERIDES: 180 mg/dL — AB (ref 40–160)

## 2015-11-03 LAB — HEMOGLOBIN A1C: Hemoglobin A1C: 7

## 2015-11-03 LAB — TSH: TSH: 4.06 u[IU]/mL (ref <=5.90)

## 2015-11-18 ENCOUNTER — Ambulatory Visit (INDEPENDENT_AMBULATORY_CARE_PROVIDER_SITE_OTHER): Payer: Medicare Other | Admitting: "Endocrinology

## 2015-11-18 ENCOUNTER — Encounter: Payer: Self-pay | Admitting: "Endocrinology

## 2015-11-18 VITALS — BP 111/72 | HR 67 | Wt 189.2 lb

## 2015-11-18 DIAGNOSIS — E785 Hyperlipidemia, unspecified: Secondary | ICD-10-CM

## 2015-11-18 DIAGNOSIS — E1159 Type 2 diabetes mellitus with other circulatory complications: Secondary | ICD-10-CM | POA: Diagnosis not present

## 2015-11-18 DIAGNOSIS — I10 Essential (primary) hypertension: Secondary | ICD-10-CM

## 2015-11-18 DIAGNOSIS — I255 Ischemic cardiomyopathy: Secondary | ICD-10-CM | POA: Diagnosis not present

## 2015-11-18 NOTE — Progress Notes (Signed)
Subjective:    Patient ID: Jordan Ross, male    DOB: December 31, 1945, PCP Glo Herring., MD   Past Medical History:  Diagnosis Date  . Abdominal aortic aneurysm ([place])   . AICD (automatic cardioverter/defibrillator) present    St Jude  . Anemia    iron deficiency anemia,takes iron pill daily  . Arthritis   . Coronary artery disease    AWMI 2001  . Diverticulosis   . GERD (gastroesophageal reflux disease)    takes Protonix daily  . History of colon polyps    benign  . History of migraine 40 yrs ago  . History of shingles   . Hyperlipidemia    takes Atorvastatin and Niacin daily  . Hypertension    takes Diovan,Spironolactone, and Carvedilol daily  . ICD (implantable cardioverter-defibrillator), single, in situ   . Ischemic cardiomyopathy   . Joint swelling   . LV dysfunction    takes Digoxin daily  . Myocardial infarction ([place]) 2001  . Pneumonia 40 yrs ago   hx of  . Shortness of breath dyspnea    with exertion.States he was a smoker for yr  . Status post coronary artery bypass grafting   . Type 2 diabetes mellitus (HCC)    takes Glipizide,Metformin,Invokana,and Lantus daily.Average fasting blood sugar runs about 140   Past Surgical History:  Procedure Laterality Date  . ABDOMINAL AORTIC ENDOVASCULAR STENT GRAFT N/A 05/22/2015   Procedure: ABDOMINAL AORTIC ENDOVASCULAR STENT GRAFT;  Surgeon: Serafina Mitchell, MD;  Location: [place];  Service: Vascular;  Laterality: N/A;  . CARDIAC CATHETERIZATION  2001  . COLONOSCOPY N/A 03/21/2014   Procedure: COLONOSCOPY;  Surgeon: Rogene Houston, MD;  [facility];  Service: Endoscopy;  Laterality: N/A;  855 - moved to 12/4 @ 8:30 - Ann notified pt  . CORONARY ARTERY BYPASS GRAFT  2001   unsure if it was 3 or 4  . DOPPLER ECHOCARDIOGRAPHY     2011 38% EF.2013 EF 30-35%   Social History   Social History  . Marital status: Widowed    Spouse name: N/A  . Number of children: N/A  . Years of education: N/A   Social  History Main Topics  . Smoking status: Former Smoker    Years: 40.00  . Smokeless tobacco: Never Used     Comment: quit smoking in 2001  . Alcohol use No  . Drug use: No  . Sexual activity: Yes   Other Topics Concern  . None   Social History Narrative  . None   Outpatient Encounter Prescriptions as of 11/18/2015  Medication Sig  . aspirin 81 MG tablet Take 81 mg by mouth daily.   [name] atorvastatin (LIPITOR) 40 MG tablet Take 40 mg by mouth daily.  . carvedilol (COREG) 12.5 MG tablet Take 12.5 mg by mouth 2 (two) times daily.  . digoxin (LANOXIN) 0.125 MG tablet Take 125 mcg by mouth daily.  [name] EASY TOUCH PEN NEEDLES 31G X 8 MM MISC USE AT BEDTIME AS DIRECTED.  [name] glucose blood test strip Use as instructed  . INVOKANA 300 MG TABS tablet Take 300 mg by mouth daily before breakfast.   . IRON PO Take 65 mg by mouth daily.  [name] LANTUS SOLOSTAR 100 UNIT/ML Solostar Pen INJECT 30 UNITS SUBCUTANEOUSLY DAILY AT BEDTIME.  . metFORMIN (GLUCOPHAGE) 500 MG tablet Take 1,000 mg by mouth 2 (two) times daily with a meal.   . niacin (NIASPAN) 500 MG CR tablet Take 500 mg  by mouth daily.  [name] oxyCODONE-acetaminophen (PERCOCET/ROXICET) 5-325 MG tablet Take 1 tablet by mouth every 6 (six) hours as needed.  . pantoprazole (PROTONIX) 40 MG tablet Take 40 mg by mouth daily.  [name] spironolactone (ALDACTONE) 25 MG tablet Take 25 mg by mouth daily.  . valsartan (DIOVAN) 160 MG tablet Take 160 mg by mouth daily.   No facility-administered encounter medications on file as of 11/18/2015.    ALLERGIES: No Known Allergies VACCINATION STATUS:  There is no immunization history on file for this patient.  Diabetes  He presents for his follow-up diabetic visit. He has type 2 diabetes mellitus. Onset time: He was diagnosed at approximate age of 5 years. His disease course has been improving. There are no hypoglycemic associated symptoms. Pertinent negatives for hypoglycemia include no confusion, headaches, pallor or seizures.  There are no diabetic associated symptoms. Pertinent negatives for diabetes include no chest pain, no fatigue, no polydipsia, no polyphagia, no polyuria and no weakness. There are no hypoglycemic complications. Symptoms are improving. Diabetic complications include heart disease. Risk factors for coronary artery disease include diabetes mellitus, dyslipidemia, hypertension, male sex, sedentary lifestyle and tobacco exposure. Current diabetic treatment includes insulin injections and oral agent (dual therapy). He is compliant with treatment most of the time. His weight is stable. He is following a generally unhealthy diet. He has had a previous visit with a dietitian. His home blood glucose trend is decreasing steadily. His overall blood glucose range is 140-180 mg/dl. An ACE inhibitor/angiotensin II receptor blocker is being taken. Eye exam is not current.  Hyperlipidemia  This is a chronic problem. The current episode started more than 1 year ago. Pertinent negatives include no chest pain, myalgias or shortness of breath. Current antihyperlipidemic treatment includes statins.  Hypertension  This is a chronic problem. The current episode started more than 1 year ago. Pertinent negatives include no chest pain, headaches, neck pain, palpitations or shortness of breath. Risk factors for coronary artery disease include dyslipidemia. Past treatments include ACE inhibitors.     Review of Systems  Constitutional: Negative for chills, fatigue, fever and unexpected weight change.  HENT: Negative for dental problem, mouth sores and trouble swallowing.   Eyes: Negative for visual disturbance.  Respiratory: Negative for cough, choking, chest tightness, shortness of breath and wheezing.   Cardiovascular: Negative for chest pain, palpitations and leg swelling.  Gastrointestinal: Negative for abdominal distention, abdominal pain, constipation, diarrhea, nausea and vomiting.  Endocrine: Negative for polydipsia,  polyphagia and polyuria.  Genitourinary: Negative for dysuria, flank pain, hematuria and urgency.  Musculoskeletal: Negative for back pain, gait problem, myalgias and neck pain.  Skin: Negative for pallor, rash and wound.  Neurological: Negative for seizures, syncope, weakness, numbness and headaches.  Psychiatric/Behavioral: Negative.  Negative for confusion and dysphoric mood.    Objective:    BP 111/72   Pulse 67   Wt 189 lb 4 oz (85.8 kg)   BMI 25.67 kg/m   Wt Readings from Last 3 Encounters:  11/18/15 189 lb 4 oz (85.8 kg)  06/25/15 186 lb (84.4 kg)  06/25/15 186 lb (84.4 kg)    Physical Exam  Constitutional: He is oriented to person, place, and time. He appears well-developed and well-nourished. He is cooperative. No distress.  HENT:  Head: Normocephalic and atraumatic.  Eyes: EOM are normal.  Neck: Normal range of motion. Neck supple. No tracheal deviation present. No thyromegaly present.  Cardiovascular: Normal rate, S1 normal, S2 normal and normal heart sounds.  Exam reveals no  gallop.   No murmur heard. Pulses:      Dorsalis pedis pulses are 1+ on the right side, and 1+ on the left side.       Posterior tibial pulses are 1+ on the right side, and 1+ on the left side.  Pulmonary/Chest: Breath sounds normal. No respiratory distress. He has no wheezes.  Abdominal: Soft. Bowel sounds are normal. He exhibits no distension. There is no tenderness. There is no guarding and no CVA tenderness.  Musculoskeletal: He exhibits no edema.       Right shoulder: He exhibits no swelling and no deformity.  Left foot in a cast.  Neurological: He is alert and oriented to person, place, and time. He has normal strength and normal reflexes. No cranial nerve deficit or sensory deficit. Gait normal.  Skin: Skin is warm and dry. No rash noted. No cyanosis. Nails show no clubbing.  Psychiatric: He has a normal mood and affect. His speech is normal and behavior is normal. Judgment and thought  content normal. Cognition and memory are normal.    Results for orders placed or performed in visit on 11/18/15  Lipid panel  Result Value Ref Range   Triglycerides 180 (A) 40 - 160 mg/dL   Cholesterol 165 0 - 200 mg/dL   HDL 46 35 - 70 mg/dL   LDL Cholesterol 83 mg/dL  Hemoglobin A1c  Result Value Ref Range   Hemoglobin A1C 7   TSH  Result Value Ref Range   TSH 4.06 0.41 - 5.90 uIU/mL   Complete Blood Count (Most recent): Lab Results  Component Value Date   WBC 9.3 05/23/2015   HGB 12.7 (L) 05/23/2015   HCT 37.6 (L) 05/23/2015   MCV 87.2 05/23/2015   PLT 181 05/23/2015   Chemistry (most recent): Lab Results  Component Value Date   NA 138 06/19/2015   K 4.6 06/19/2015   CL 101 06/19/2015   CO2 23 06/19/2015   BUN 16 06/19/2015   CREATININE 1.12 06/19/2015   Diabetic Labs (most recent): Lab Results  Component Value Date   HGBA1C 7 11/03/2015   HGBA1C 7.3 (H) 06/19/2015   HGBA1C 7.4 (H) 05/22/2015      Assessment & Plan:   1. Type 2 diabetes mellitus with vascular disease (HCC)  - His diabetes is  complicated by coronary artery disease and patient remains at a high risk for more acute and chronic complications of diabetes which include CAD, CVA, CKD, retinopathy, and neuropathy. These are all discussed in detail with the patient.  Patient came with improved glucose profile, and  recent A1c of 7 %.  Glucose logs and insulin administration records pertaining to this visit,  to be scanned into patient's records.  Recent labs reviewed.   - I have re-counseled the patient on diet management and weight loss  by adopting a carbohydrate restricted / protein rich  Diet.  - Suggestion is made for patient to avoid simple carbohydrates   from their diet including Cakes , Desserts, Ice Cream,  Soda (  diet and regular) , Sweet Tea , Candies,  Chips, Cookies, Artificial Sweeteners,   and "Sugar-free" Products .  This will help patient to have stable blood glucose profile and  potentially avoid unintended  Weight gain.  - Patient is advised to stick to a routine mealtimes to eat 3 meals  a day and avoid unnecessary snacks ( to snack only to correct hypoglycemia).  - The patient  will be  scheduled with Kieth Brightly  Crumpton, RDN, CDE for individualized DM education.  - I have approached patient with the following individualized plan to manage diabetes and patient agrees.   -He will continue MTF 1gm po BID and Invokana 300 mg po qday. I will continue  Lantus to 30 units qhs associated with monitoring BG  BEFORE  BREAKFAST.  - Patient specific target  for A1c; LDL, HDL, Triglycerides, and  Waist Circumference were discussed in detail.  2) BP/HTN:  Controlled. Continue current medications including ACEI/ARB. 3) Lipids/HPL:  continue statins. 4)  Weight/Diet: CDE consult in progress, exercise, and carbohydrates information provided.  5) Chronic Care/Health Maintenance:  -Patient  is on ACEI/ARB and Statin medications and encouraged to continue to follow up with Ophthalmology, Podiatrist at least yearly or according to recommendations, and advised to  stay away from smoking. I have recommended yearly flu vaccine and pneumonia vaccination at least every 5 years; moderate intensity exercise for up to 150 minutes weekly; and  sleep for at least 7 hours a day.  - 25 minutes of time was spent on the care of this patient , 50% of which was applied for counseling on diabetes complications and their preventions.  - I advised patient to maintain close follow up with Glo Herring., MD for primary care needs.  Patient is asked to bring meter and  blood glucose logs during their next visit.   Follow up plan: -Return in about 3 months (around 02/18/2016) for meter, and logs.  Glade Lloyd, MD Phone: [phone]  Fax: [phone]   11/18/2015, 3:06 PM

## 2015-11-18 NOTE — Patient Instructions (Signed)

## 2015-11-23 DIAGNOSIS — M7522 Bicipital tendinitis, left shoulder: Secondary | ICD-10-CM | POA: Diagnosis not present

## 2015-11-23 DIAGNOSIS — S42255A Nondisplaced fracture of greater tuberosity of left humerus, initial encounter for closed fracture: Secondary | ICD-10-CM | POA: Diagnosis not present

## 2015-11-23 DIAGNOSIS — S92012A Displaced fracture of body of left calcaneus, initial encounter for closed fracture: Secondary | ICD-10-CM | POA: Diagnosis not present

## 2015-12-14 ENCOUNTER — Other Ambulatory Visit: Payer: Self-pay | Admitting: "Endocrinology

## 2015-12-15 ENCOUNTER — Encounter: Payer: Self-pay | Admitting: "Endocrinology

## 2015-12-16 ENCOUNTER — Encounter: Payer: Self-pay | Admitting: Cardiovascular Disease

## 2015-12-16 ENCOUNTER — Ambulatory Visit (INDEPENDENT_AMBULATORY_CARE_PROVIDER_SITE_OTHER): Payer: Medicare Other | Admitting: Cardiovascular Disease

## 2015-12-16 VITALS — BP 113/70 | HR 57 | Ht 72.0 in | Wt 188.8 lb

## 2015-12-16 DIAGNOSIS — I2510 Atherosclerotic heart disease of native coronary artery without angina pectoris: Secondary | ICD-10-CM | POA: Diagnosis not present

## 2015-12-16 DIAGNOSIS — I714 Abdominal aortic aneurysm, without rupture, unspecified: Secondary | ICD-10-CM

## 2015-12-16 DIAGNOSIS — I5022 Chronic systolic (congestive) heart failure: Secondary | ICD-10-CM

## 2015-12-16 DIAGNOSIS — I10 Essential (primary) hypertension: Secondary | ICD-10-CM

## 2015-12-16 DIAGNOSIS — E785 Hyperlipidemia, unspecified: Secondary | ICD-10-CM

## 2015-12-16 DIAGNOSIS — E1159 Type 2 diabetes mellitus with other circulatory complications: Secondary | ICD-10-CM

## 2015-12-16 DIAGNOSIS — I255 Ischemic cardiomyopathy: Secondary | ICD-10-CM

## 2015-12-16 DIAGNOSIS — Z95810 Presence of automatic (implantable) cardiac defibrillator: Secondary | ICD-10-CM | POA: Diagnosis not present

## 2015-12-16 NOTE — Patient Instructions (Signed)
Dr Sallyanne Kuster recommends that you continue on your current medications as directed. Please refer to the Current Medication list given to you today.  Remote monitoring is used to monitor your Pacemaker of ICD from home. This monitoring reduces the number of office visits required to check your device to one time per year. It allows [place] to keep an eye on the functioning of your device to ensure it is working properly. You are scheduled for a device check from home on Wednesday, November 30th, 2017. You may send your transmission at any time that day. If you have a wireless device, the transmission will be sent automatically. After your physician reviews your transmission, you will receive a postcard with your next transmission date.  Dr Sallyanne Kuster recommends that you schedule a follow-up appointment in 12 months with a defibrillator check. You will receive a reminder letter in the mail two months in advance. If you don't receive a letter, please call our office to schedule the follow-up appointment.  If you need a refill on your cardiac medications before your next appointment, please call your pharmacy.[name]

## 2015-12-16 NOTE — Progress Notes (Signed)
Cardiology Office Note    Date:  12/17/2015   ID:  Jordan Ross, DOB 11-14-1945, MRN [id number]  PCP:  Glo Herring., MD  Cardiologist:  Quay Burow, M.D. Sanda Klein, MD   Chief Complaint  Patient presents with  . Follow-up    History of Present Illness:  Jordan Ross is a 70 y.o. male for defibrillator follow-up. He last saw Dr. Gwenlyn Found for general cardiology follow-up in December and had post EVAR for AAA evaluation with Dr. Trula Slade in March. [name] feels well and has no complaints. A few months ago he fell from a ladder and injured his left shoulder, but has recovered from that. He denies exertional angina or dyspnea, palpitations, syncope, leg edema or claudication.  Interrogation of his single-chamber St. Jude fortify ICD shows normal device function. The device was implanted in 2010 and has about 4.8 years of remaining longevity. It has not recorded VT or VF were delivered to therapy. He does not require ventricular pacing. Lead parameters are excellent.  He presented with an anterior wall myocardial infarction in 2001 treated with coronary artery bypass grafting by Dr. Roxy Manns (LIMA to his LAD, SVGs to a ramus branch, circumflex and PDA). His EF was in the 35% range. He underwent ICD implantation (single-chamber St. Jude Fortify) for primary prevention October 2010. He has not received either appropriate or inappropriate shocks. His other problems include a known abdominal aortic aneurysm s/p EVAR 05/2015, bilateral renal artery stenosis, hypertension, hyperlipidemia and non-insulin-requiring diabetes. He has chronic shortness of breath, likely related to a history of tobacco abuse, having smoked 40-pack-years, quit 2001 at the time of his open heart surgery. The stress Myoview performed 04/07/11 showed anterior scar without ischemia.  Past Medical History:  Diagnosis Date  . Abdominal aortic aneurysm ([place])   . AICD (automatic cardioverter/defibrillator) present    St  Jude  . Anemia    iron deficiency anemia,takes iron pill daily  . Arthritis   . Coronary artery disease    AWMI 2001  . Diverticulosis   . GERD (gastroesophageal reflux disease)    takes Protonix daily  . History of colon polyps    benign  . History of migraine 40 yrs ago  . History of shingles   . Hyperlipidemia    takes Atorvastatin and Niacin daily  . Hypertension    takes Diovan,Spironolactone, and Carvedilol daily  . ICD (implantable cardioverter-defibrillator), single, in situ   . Ischemic cardiomyopathy   . Joint swelling   . LV dysfunction    takes Digoxin daily  . Myocardial infarction ([place]) 2001  . Pneumonia 40 yrs ago   hx of  . Shortness of breath dyspnea    with exertion.States he was a smoker for yr  . Status post coronary artery bypass grafting   . Type 2 diabetes mellitus (HCC)    takes Glipizide,Metformin,Invokana,and Lantus daily.Average fasting blood sugar runs about 140    Past Surgical History:  Procedure Laterality Date  . ABDOMINAL AORTIC ENDOVASCULAR STENT GRAFT N/A 05/22/2015   Procedure: ABDOMINAL AORTIC ENDOVASCULAR STENT GRAFT;  Surgeon: Serafina Mitchell, MD;  Location: [facility];  Service: Vascular;  Laterality: N/A;  . CARDIAC CATHETERIZATION  2001  . COLONOSCOPY N/A 03/21/2014   Procedure: COLONOSCOPY;  Surgeon: Rogene Houston, MD;  [facility];  Service: Endoscopy;  Laterality: N/A;  855 - moved to 12/4 @ 8:30 - Ann notified pt  . CORONARY ARTERY BYPASS GRAFT  2001   unsure if it was  3 or 4  . DOPPLER ECHOCARDIOGRAPHY     2011 38% EF.2013 EF 30-35%    Current Medications: Outpatient Medications Prior to Visit  Medication Sig Dispense Refill  . aspirin 81 MG tablet Take 81 mg by mouth daily.     [name] atorvastatin (LIPITOR) 40 MG tablet Take 40 mg by mouth daily.    . carvedilol (COREG) 12.5 MG tablet Take 12.5 mg by mouth 2 (two) times daily.    . digoxin (LANOXIN) 0.125 MG tablet Take 125 mcg by mouth daily.    [name] glucose blood test  strip Use as instructed 100 each 2  . INVOKANA 300 MG TABS tablet Take 300 mg by mouth daily before breakfast.     . IRON PO Take 65 mg by mouth daily.    [name] LANTUS SOLOSTAR 100 UNIT/ML Solostar Pen INJECT 30 UNITS SUBCUTANEOUSLY DAILY AT BEDTIME. 15 mL 2  . LITETOUCH PEN NEEDLES 31G X 8 MM MISC USE AT BEDTIME AS DIRECTED. 50 each 5  . metFORMIN (GLUCOPHAGE) 500 MG tablet Take 1,000 mg by mouth 2 (two) times daily with a meal.     . niacin (NIASPAN) 500 MG CR tablet Take 500 mg by mouth daily.    . pantoprazole (PROTONIX) 40 MG tablet Take 40 mg by mouth daily.    [name] spironolactone (ALDACTONE) 25 MG tablet Take 25 mg by mouth daily.    . valsartan (DIOVAN) 160 MG tablet Take 160 mg by mouth daily.    [name] oxyCODONE-acetaminophen (PERCOCET/ROXICET) 5-325 MG tablet Take 1 tablet by mouth every 6 (six) hours as needed. (Patient not taking: Reported on 12/16/2015) 30 tablet 0   No facility-administered medications prior to visit.      Allergies:   Review of patient's allergies indicates no known allergies.   Social History   Social History  . Marital status: Widowed    Spouse name: N/A  . Number of children: N/A  . Years of education: N/A   Social History Main Topics  . Smoking status: Former Smoker    Years: 40.00  . Smokeless tobacco: Never Used     Comment: quit smoking in 2001  . Alcohol use No  . Drug use: No  . Sexual activity: Yes   Other Topics Concern  . None   Social History Narrative  . None     Family History:  The patient's family history includes COPD in his mother; Heart disease in his brother.   ROS:   Please see the history of present illness.    ROS All other systems reviewed and are negative.   PHYSICAL EXAM:   VS:  BP 113/70 (BP Location: Right Arm, Patient Position: Sitting, Cuff Size: Normal)   Pulse (!) 57   Ht 6' (1.829 m)   Wt 188 lb 12.8 oz (85.6 kg)   SpO2 98%   BMI 25.61 kg/m    GEN: Well nourished, well developed, in no acute distress    HEENT: normal  Neck: no JVD, carotid bruits, or masses Cardiac: RRR; no murmurs, rubs, or gallops,no edema , also subclavian defibrillator sit Respiratory:  clear to auscultation bilaterally, normal work of breathing GI: soft, nontender, nondistended, + BS MS: no deformity or atrophy  Skin: warm and dry, no rash Neuro:  Alert and Oriented x 3, Strength and sensation are intact Psych: euthymic mood, full affect  Wt Readings from Last 3 Encounters:  12/16/15 188 lb 12.8 oz (85.6 kg)  11/18/15 189 lb 4 oz (85.8 kg)  06/25/15 186 lb (84.4 kg)      Studies/Labs Reviewed:   EKG:  EKG is ordered today.  The ekg ordered today demonstrates Sinus bradycardia with a "frozen infarct" pattern in the anterior leads, minor IVCD with QRS 110 ms, QTC 348 ms  Recent Labs: 05/18/2015: ALT 11 05/22/2015: Magnesium 1.5 05/23/2015: Hemoglobin 12.7; Platelets 181 06/19/2015: BUN 16; Creat 1.12; Potassium 4.6; Sodium 138 11/03/2015: TSH 4.06   Lipid Panel    Component Value Date/Time   CHOL 165 11/03/2015   TRIG 180 (A) 11/03/2015   HDL 46 11/03/2015   LDLCALC 83 11/03/2015     ASSESSMENT:    1. ICD (implantable cardioverter-defibrillator), single, in situ   2. Chronic systolic heart failure ([place])   3. Coronary artery disease involving native coronary artery of native heart without angina pectoris   4. Abdominal aortic aneurysm (AAA) without rupture ([place])   5. Essential hypertension   6. Hyperlipidemia   7. Type 2 diabetes mellitus with vascular disease ([place])      PLAN:  In order of problems listed above:  1. ICD: Normal device function, remote downloads every 3 months and office visit yearly 2. CHF: Clinically euvolemic, NYHA functional class I-II. 3. CAD s/p CABG: No angina pectoris, most recent nuclear study without reversible ischemia. 4. AAA s/p EVAR: Good results, followed by Dr. Trula Slade 5. HTN: Excellent control, beta blockers should be part of his medical regimen 6. HLP: Fair  lipid profile on statin 7. DM: Adequate control, most recent A1c 7% is an improvement.    Medication Adjustments/Labs and Tests Ordered: Current medicines are reviewed at length with the patient today.  Concerns regarding medicines are outlined above.  Medication changes, Labs and Tests ordered today are listed in the Patient Instructions below. Patient Instructions  Dr Sallyanne Kuster recommends that you continue on your current medications as directed. Please refer to the Current Medication list given to you today.  Remote monitoring is used to monitor your Pacemaker of ICD from home. This monitoring reduces the number of office visits required to check your device to one time per year. It allows [place] to keep an eye on the functioning of your device to ensure it is working properly. You are scheduled for a device check from home on Wednesday, November 30th, 2017. You may send your transmission at any time that day. If you have a wireless device, the transmission will be sent automatically. After your physician reviews your transmission, you will receive a postcard with your next transmission date.  Dr Sallyanne Kuster recommends that you schedule a follow-up appointment in 12 months with a defibrillator check. You will receive a reminder letter in the mail two months in advance. If you don't receive a letter, please call our office to schedule the follow-up appointment.  If you need a refill on your cardiac medications before your next appointment, please call your pharmacy.Mikael Spray, MD  12/17/2015 5:51 PM    [address], [place], [place]  [address] Phone: [phone]; Fax: [phone]

## 2015-12-17 DIAGNOSIS — I5022 Chronic systolic (congestive) heart failure: Secondary | ICD-10-CM | POA: Insufficient documentation

## 2015-12-25 DIAGNOSIS — M7552 Bursitis of left shoulder: Secondary | ICD-10-CM | POA: Diagnosis not present

## 2015-12-25 DIAGNOSIS — S92012A Displaced fracture of body of left calcaneus, initial encounter for closed fracture: Secondary | ICD-10-CM | POA: Diagnosis not present

## 2015-12-25 DIAGNOSIS — M7502 Adhesive capsulitis of left shoulder: Secondary | ICD-10-CM | POA: Diagnosis not present

## 2016-01-05 ENCOUNTER — Encounter: Payer: Self-pay | Admitting: Surgery

## 2016-01-11 ENCOUNTER — Ambulatory Visit (INDEPENDENT_AMBULATORY_CARE_PROVIDER_SITE_OTHER): Payer: Medicare Other | Admitting: Surgery

## 2016-01-11 ENCOUNTER — Encounter: Payer: Self-pay | Admitting: Surgery

## 2016-01-11 ENCOUNTER — Ambulatory Visit (INDEPENDENT_AMBULATORY_CARE_PROVIDER_SITE_OTHER)
Admission: RE | Admit: 2016-01-11 | Discharge: 2016-01-11 | Disposition: A | Discharge status: Home / Self Care | Payer: Medicare Other | Source: Ambulatory Visit | Attending: Surgery | Admitting: Surgery

## 2016-01-11 ENCOUNTER — Ambulatory Visit (HOSPITAL_COMMUNITY)
Admission: RE | Admit: 2016-01-11 | Discharge: 2016-01-11 | Disposition: A | Discharge status: Home / Self Care | Payer: Medicare Other | Source: Ambulatory Visit | Attending: Surgery | Admitting: Surgery

## 2016-01-11 VITALS — BP 107/65 | HR 53 | Temp 97.1°F | Resp 16 | Ht 72.0 in | Wt 188.0 lb

## 2016-01-11 DIAGNOSIS — I714 Abdominal aortic aneurysm, without rupture, unspecified: Secondary | ICD-10-CM

## 2016-01-11 DIAGNOSIS — I6523 Occlusion and stenosis of bilateral carotid arteries: Secondary | ICD-10-CM

## 2016-01-11 DIAGNOSIS — I255 Ischemic cardiomyopathy: Secondary | ICD-10-CM | POA: Diagnosis not present

## 2016-01-11 LAB — VAS US CAROTID
LCCADDIAS: 19 cm/s
LCCADSYS: 56 cm/s
LEFT ECA DIAS: -13 cm/s
LEFT VERTEBRAL DIAS: 15 cm/s
LICADDIAS: -30 cm/s
LICADSYS: -74 cm/s
LICAPDIAS: -15 cm/s
LICAPSYS: -49 cm/s
Left CCA prox dias: 22 cm/s
Left CCA prox sys: 81 cm/s
RIGHT CCA MID DIAS: 22 cm/s
RIGHT ECA DIAS: -10 cm/s
RIGHT VERTEBRAL DIAS: 11 cm/s
Right CCA prox dias: 12 cm/s
Right CCA prox sys: 96 cm/s
Right cca dist sys: -96 cm/s

## 2016-01-11 NOTE — Progress Notes (Signed)
[facility]  Patient name: Jordan Ross MRN: [id number] DOB: [birth year] Sex: male  REASON FOR VISIT: follow up  HPI: Jordan Ross is a 70 y.o. male who returns today for follow-up.  On 05-23-2015, he underwent endovascular repair of a 5 cm infrarenal abdominal aortic aneurysm.  His postoperative course was uncomplicated.  His initial CT scan showed no evidence of endoleak.  He is back today for follow-up.  Since I last saw him, he fell off of a ladder while trying to get a birds nest out of his carotids.  He suffered fractures to his left shoulder and foot.  Past Medical History:  Diagnosis Date  . Abdominal aortic aneurysm ([place])   . AICD (automatic cardioverter/defibrillator) present    St Jude  . Anemia    iron deficiency anemia,takes iron pill daily  . Arthritis   . Coronary artery disease    AWMI 2001  . Diverticulosis   . GERD (gastroesophageal reflux disease)    takes Protonix daily  . History of colon polyps    benign  . History of migraine 40 yrs ago  . History of shingles   . Hyperlipidemia    takes Atorvastatin and Niacin daily  . Hypertension    takes Diovan,Spironolactone, and Carvedilol daily  . ICD (implantable cardioverter-defibrillator), single, in situ   . Ischemic cardiomyopathy   . Joint swelling   . LV dysfunction    takes Digoxin daily  . Myocardial infarction ([place]) 2001  . Pneumonia 40 yrs ago   hx of  . Shortness of breath dyspnea    with exertion.States he was a smoker for yr  . Status post coronary artery bypass grafting   . Type 2 diabetes mellitus (HCC)    takes Glipizide,Metformin,Invokana,and Lantus daily.Average fasting blood sugar runs about 140    Family History  Problem Relation Age of Onset  . COPD Mother   . Heart disease Brother     SOCIAL HISTORY: Social History  Substance Use Topics  . Smoking status: Former Smoker    Years: 40.00  . Smokeless tobacco:  Never Used     Comment: quit smoking in 2001  . Alcohol use No    No Known Allergies  Current Outpatient Prescriptions  Medication Sig Dispense Refill  . aspirin 81 MG tablet Take 81 mg by mouth daily.     [name] atorvastatin (LIPITOR) 40 MG tablet Take 40 mg by mouth daily.    . carvedilol (COREG) 12.5 MG tablet Take 12.5 mg by mouth 2 (two) times daily.    . digoxin (LANOXIN) 0.125 MG tablet Take 125 mcg by mouth daily.    [name] glucose blood test strip Use as instructed 100 each 2  . INVOKANA 300 MG TABS tablet Take 300 mg by mouth daily before breakfast.     . IRON PO Take 65 mg by mouth daily.    [name] LANTUS SOLOSTAR 100 UNIT/ML Solostar Pen INJECT 30 UNITS SUBCUTANEOUSLY DAILY AT BEDTIME. 15 mL 2  . LITETOUCH PEN NEEDLES 31G X 8 MM MISC USE AT BEDTIME AS DIRECTED. 50 each 5  . metFORMIN (GLUCOPHAGE) 500 MG tablet Take 1,000 mg by mouth 2 (two) times daily with a meal.     . niacin (NIASPAN) 500 MG CR tablet Take 500 mg by mouth daily.    . pantoprazole (PROTONIX) 40 MG tablet Take 40 mg by mouth daily.    [name] spironolactone (ALDACTONE) 25 MG tablet Take 25 mg by  mouth daily.    . valsartan (DIOVAN) 160 MG tablet Take 160 mg by mouth daily.     No current facility-administered medications for this visit.     REVIEW OF SYSTEMS:  [X]  denotes positive finding, [ ]  denotes negative finding Cardiac  Comments:  Chest pain or chest pressure:    Shortness of breath upon exertion:    Short of breath when lying flat:    Irregular heart rhythm:        Vascular    Pain in calf, thigh, or hip brought on by ambulation:    Pain in feet at night that wakes you up from your sleep:     Blood clot in your veins:    Leg swelling:         Pulmonary    Oxygen at home:    Productive cough:     Wheezing:         Neurologic    Sudden weakness in arms or legs:     Sudden numbness in arms or legs:     Sudden onset of difficulty speaking or slurred speech:    Temporary loss of vision in one eye:       Problems with dizziness:         Gastrointestinal    Blood in stool:     Vomited blood:         Genitourinary    Burning when urinating:     Blood in urine:        Psychiatric    Major depression:         Hematologic    Bleeding problems:    Problems with blood clotting too easily:        Skin    Rashes or ulcers:        Constitutional    Fever or chills:      PHYSICAL EXAM: There were no vitals filed for this visit.  GENERAL: The patient is a well-nourished male, in no acute distress. The vital signs are documented above. CARDIAC: There is a regular rate and rhythm.  VASCULAR: Palpable pedal pulses bilaterally PULMONARY: There is good air exchange bilaterally without wheezing or rales. ABDOMEN: Soft and non-tender with normal pitched bowel sounds.  MUSCULOSKELETAL: There are no major deformities or cyanosis.  Tenderness in left foot and left shoulder NEUROLOGIC: No focal weakness or paresthesias are detected. SKIN: There are no ulcers or rashes noted. PSYCHIATRIC: The patient has a normal affect.  DATA:  Carotid Duplex:  <40% bilateral stenosis AAA: No evidence of endoleak.  The aneurysm has decreased in size.  It now measures with a maximum diameter of 4.4 cm  MEDICAL ISSUES: Carotid:  No further follow up needed as arteries were normal on duplex AAA:  S/p EVAR without complication.  Aneurysm continues to decrease in size without evidence of endoleak.  Follow-up 1 year with repeat ultrasound    Annamarie Major, MD [facility] [phone] Pager [phone]

## 2016-01-15 ENCOUNTER — Other Ambulatory Visit (HOSPITAL_COMMUNITY): Payer: Self-pay | Admitting: Orthopedic Surgery

## 2016-01-15 DIAGNOSIS — M25512 Pain in left shoulder: Secondary | ICD-10-CM | POA: Diagnosis not present

## 2016-01-15 DIAGNOSIS — M7552 Bursitis of left shoulder: Secondary | ICD-10-CM

## 2016-01-15 DIAGNOSIS — M7502 Adhesive capsulitis of left shoulder: Secondary | ICD-10-CM

## 2016-01-15 DIAGNOSIS — S42255A Nondisplaced fracture of greater tuberosity of left humerus, initial encounter for closed fracture: Secondary | ICD-10-CM | POA: Diagnosis not present

## 2016-01-19 LAB — CUP PACEART INCLINIC DEVICE CHECK
Implantable Lead Location: 753860
Lead Channel Setting Pacing Amplitude: 2.5 V
Lead Channel Setting Pacing Pulse Width: 0.4 ms
MDC IDC LEAD IMPLANT DT: 20101028
MDC IDC MSMT BATTERY REMAINING LONGEVITY: 56 mo
MDC IDC MSMT BATTERY REMAINING PERCENTAGE: 48 %
MDC IDC PG SERIAL: 737526
MDC IDC SESS DTM: 20171003091832
MDC IDC SET LEADCHNL RV SENSING SENSITIVITY: 0.5 mV

## 2016-01-20 ENCOUNTER — Encounter: Payer: Self-pay | Admitting: Cardiovascular Disease

## 2016-01-21 ENCOUNTER — Encounter: Payer: Self-pay | Admitting: "Endocrinology

## 2016-02-19 ENCOUNTER — Ambulatory Visit: Payer: Medicare Other | Admitting: "Endocrinology

## 2016-02-26 ENCOUNTER — Other Ambulatory Visit: Payer: Self-pay

## 2016-02-26 MED ORDER — LANCETS ULTRA THIN MISC: 5 refills | Status: AC

## 2016-02-26 MED ORDER — GLUCOSE BLOOD VI STRP: ORAL_STRIP | 5 refills | Status: DC

## 2016-02-29 ENCOUNTER — Other Ambulatory Visit: Payer: Self-pay | Admitting: "Endocrinology

## 2016-02-29 DIAGNOSIS — E1159 Type 2 diabetes mellitus with other circulatory complications: Secondary | ICD-10-CM | POA: Diagnosis not present

## 2016-03-01 LAB — COMPLETE METABOLIC PANEL WITH GFR
ALT: 14 U/L (ref 9–46)
AST: 20 U/L (ref 10–35)
Albumin: 4.4 g/dL (ref 3.6–5.1)
Alkaline Phosphatase: 47 U/L (ref 40–115)
BILIRUBIN TOTAL: 0.7 mg/dL (ref 0.2–1.2)
BUN: 14 mg/dL (ref 7–25)
CHLORIDE: 102 mmol/L (ref 98–110)
CO2: 28 mmol/L (ref 20–31)
CREATININE: 1.22 mg/dL (ref 0.70–1.25)
Calcium: 10.2 mg/dL (ref 8.6–10.3)
GFR, EST AFRICAN AMERICAN: 70 mL/min (ref >=60)
GFR, Est Non African American: 61 mL/min (ref >=60)
GLUCOSE: 149 mg/dL — AB (ref 65–99)
Potassium: 5.3 mmol/L (ref 3.5–5.3)
SODIUM: 139 mmol/L (ref 135–146)
TOTAL PROTEIN: 7.1 g/dL (ref 6.1–8.1)

## 2016-03-01 LAB — HEMOGLOBIN A1C
Hgb A1c MFr Bld: 7.2 % — ABNORMAL HIGH (ref <5.7)
MEAN PLASMA GLUCOSE: 160 mg/dL

## 2016-03-07 ENCOUNTER — Ambulatory Visit (INDEPENDENT_AMBULATORY_CARE_PROVIDER_SITE_OTHER): Payer: Medicare Other | Admitting: "Endocrinology

## 2016-03-07 ENCOUNTER — Encounter: Payer: Self-pay | Admitting: "Endocrinology

## 2016-03-07 VITALS — BP 138/72 | HR 76 | Ht 72.0 in | Wt 186.0 lb

## 2016-03-07 DIAGNOSIS — Z23 Encounter for immunization: Secondary | ICD-10-CM | POA: Diagnosis not present

## 2016-03-07 DIAGNOSIS — E782 Mixed hyperlipidemia: Secondary | ICD-10-CM

## 2016-03-07 DIAGNOSIS — I255 Ischemic cardiomyopathy: Secondary | ICD-10-CM

## 2016-03-07 DIAGNOSIS — I10 Essential (primary) hypertension: Secondary | ICD-10-CM

## 2016-03-07 DIAGNOSIS — E1159 Type 2 diabetes mellitus with other circulatory complications: Secondary | ICD-10-CM

## 2016-03-07 NOTE — Progress Notes (Signed)
Subjective:    Patient ID: Jordan Ross, male    DOB: [birth year], PCP Glo Herring., MD   Past Medical History:  Diagnosis Date  . Abdominal aortic aneurysm ([place])   . AICD (automatic cardioverter/defibrillator) present    [employer]  . Anemia    iron deficiency anemia,takes iron pill daily  . Arthritis   . Coronary artery disease    AWMI 2001  . Diverticulosis   . GERD (gastroesophageal reflux disease)    takes Protonix daily  . History of colon polyps    benign  . History of migraine 40 yrs ago  . History of shingles   . Hyperlipidemia    takes Atorvastatin and Niacin daily  . Hypertension    takes Diovan,Spironolactone, and Carvedilol daily  . ICD (implantable cardioverter-defibrillator), single, in situ   . Ischemic cardiomyopathy   . Joint swelling   . LV dysfunction    takes Digoxin daily  . Myocardial infarction 2001  . Pneumonia 40 yrs ago   hx of  . Shortness of breath dyspnea    with exertion.States he was a smoker for yr  . Status post coronary artery bypass grafting   . Type 2 diabetes mellitus (HCC)    takes Glipizide,Metformin,Invokana,and Lantus daily.Average fasting blood sugar runs about 140   Past Surgical History:  Procedure Laterality Date  . ABDOMINAL AORTIC ENDOVASCULAR STENT GRAFT N/A 05/22/2015   Procedure: ABDOMINAL AORTIC ENDOVASCULAR STENT GRAFT;  Surgeon: Serafina Mitchell, MD;  Location: [facility];  Service: Vascular;  Laterality: N/A;  . CARDIAC CATHETERIZATION  2001  . COLONOSCOPY N/A 03/21/2014   Procedure: COLONOSCOPY;  Surgeon: Rogene Houston, MD;  Location: [facility];  Service: Endoscopy;  Laterality: N/A;  855 - moved to 12/4 @ 8:30 - Ann notified pt  . CORONARY ARTERY BYPASS GRAFT  2001   unsure if it was 3 or 4  . DOPPLER ECHOCARDIOGRAPHY     2011 38% EF.2013 EF 30-35%   Social History   Social History  . Marital status: Widowed    Spouse name: N/A  . Number of children: N/A  . Years of education: N/A   Social  History Main Topics  . Smoking status: Former Smoker    Years: 40.00  . Smokeless tobacco: Never Used     Comment: quit smoking in 2001  . Alcohol use No  . Drug use: No  . Sexual activity: Yes   Other Topics Concern  . Not on file   Social History Narrative  . No narrative on file   Outpatient Encounter Prescriptions as of 03/07/2016  Medication Sig  . aspirin 81 MG tablet Take 81 mg by mouth daily.   [name] atorvastatin (LIPITOR) 40 MG tablet Take 40 mg by mouth daily.  . carvedilol (COREG) 12.5 MG tablet Take 12.5 mg by mouth 2 (two) times daily.  . digoxin (LANOXIN) 0.125 MG tablet Take 125 mcg by mouth daily.  [name] glucose blood test strip Use as instructed  . glucose blood test strip Use as instructed once daily  . INVOKANA 300 MG TABS tablet Take 300 mg by mouth daily before breakfast.   . IRON PO Take 65 mg by mouth daily.  [name] LANCETS ULTRA THIN MISC Test BG every morning  . LANTUS SOLOSTAR 100 UNIT/ML Solostar Pen INJECT 30 UNITS SUBCUTANEOUSLY DAILY AT BEDTIME.  [name] LITETOUCH PEN NEEDLES 31G X 8 MM MISC USE AT BEDTIME AS DIRECTED.  [name] metFORMIN (GLUCOPHAGE) 500 MG tablet  Take 1,000 mg by mouth 2 (two) times daily with a meal.   . niacin (NIASPAN) 500 MG CR tablet Take 500 mg by mouth daily.  . pantoprazole (PROTONIX) 40 MG tablet Take 40 mg by mouth daily.  [name] spironolactone (ALDACTONE) 25 MG tablet Take 25 mg by mouth daily.  . valsartan (DIOVAN) 160 MG tablet Take 160 mg by mouth daily.   No facility-administered encounter medications on file as of 03/07/2016.    ALLERGIES: No Known Allergies VACCINATION STATUS:  There is no immunization history on file for this patient.  Diabetes  He presents for his follow-up diabetic visit. He has type 2 diabetes mellitus. Onset time: He was diagnosed at approximate age of 66 years. His disease course has been improving. There are no hypoglycemic associated symptoms. Pertinent negatives for hypoglycemia include no confusion, headaches,  pallor or seizures. There are no diabetic associated symptoms. Pertinent negatives for diabetes include no chest pain, no fatigue, no polydipsia, no polyphagia, no polyuria and no weakness. There are no hypoglycemic complications. Symptoms are improving. Diabetic complications include heart disease. Risk factors for coronary artery disease include diabetes mellitus, dyslipidemia, hypertension, male sex, sedentary lifestyle and tobacco exposure. Current diabetic treatment includes insulin injections and oral agent (dual therapy). He is compliant with treatment most of the time. His weight is stable. He is following a generally unhealthy diet. He has had a previous visit with a dietitian. His home blood glucose trend is decreasing steadily. His overall blood glucose range is 140-180 mg/dl. An ACE inhibitor/angiotensin II receptor blocker is being taken. Eye exam is not current.  Hyperlipidemia  This is a chronic problem. The current episode started more than 1 year ago. Pertinent negatives include no chest pain, myalgias or shortness of breath. Current antihyperlipidemic treatment includes statins.  Hypertension  This is a chronic problem. The current episode started more than 1 year ago. Pertinent negatives include no chest pain, headaches, neck pain, palpitations or shortness of breath. Risk factors for coronary artery disease include dyslipidemia. Past treatments include ACE inhibitors.     Review of Systems  Constitutional: Negative for chills, fatigue, fever and unexpected weight change.  HENT: Negative for dental problem, mouth sores and trouble swallowing.   Eyes: Negative for visual disturbance.  Respiratory: Negative for cough, choking, chest tightness, shortness of breath and wheezing.   Cardiovascular: Negative for chest pain, palpitations and leg swelling.  Gastrointestinal: Negative for abdominal distention, abdominal pain, constipation, diarrhea, nausea and vomiting.  Endocrine: Negative  for polydipsia, polyphagia and polyuria.  Genitourinary: Negative for dysuria, flank pain, hematuria and urgency.  Musculoskeletal: Negative for back pain, gait problem, myalgias and neck pain.  Skin: Negative for pallor, rash and wound.  Neurological: Negative for seizures, syncope, weakness, numbness and headaches.  Psychiatric/Behavioral: Negative.  Negative for confusion and dysphoric mood.    Objective:    BP 138/72   Pulse 76   Ht 6' (1.829 m)   Wt 186 lb (84.4 kg)   BMI 25.23 kg/m   Wt Readings from Last 3 Encounters:  03/07/16 186 lb (84.4 kg)  01/11/16 188 lb (85.3 kg)  12/16/15 188 lb 12.8 oz (85.6 kg)    Physical Exam  Constitutional: He is oriented to person, place, and time. He appears well-developed and well-nourished. He is cooperative. No distress.  HENT:  Head: Normocephalic and atraumatic.  Eyes: EOM are normal.  Neck: Normal range of motion. Neck supple. No tracheal deviation present. No thyromegaly present.  Cardiovascular: Normal rate, S1 normal,  S2 normal and normal heart sounds.  Exam reveals no gallop.   No murmur heard. Pulses:      Dorsalis pedis pulses are 1+ on the right side, and 1+ on the left side.       Posterior tibial pulses are 1+ on the right side, and 1+ on the left side.  Pulmonary/Chest: Breath sounds normal. No respiratory distress. He has no wheezes.  Abdominal: Soft. Bowel sounds are normal. He exhibits no distension. There is no tenderness. There is no guarding and no CVA tenderness.  Musculoskeletal: He exhibits no edema.       Right shoulder: He exhibits no swelling and no deformity.  Left foot in a cast.  Neurological: He is alert and oriented to person, place, and time. He has normal strength and normal reflexes. No cranial nerve deficit or sensory deficit. Gait normal.  Skin: Skin is warm and dry. No rash noted. No cyanosis. Nails show no clubbing.  Psychiatric: He has a normal mood and affect. His speech is normal and behavior is  normal. Judgment and thought content normal. Cognition and memory are normal.    Results for orders placed or performed in visit on 02/29/16  COMPLETE METABOLIC PANEL WITH GFR  Result Value Ref Range   Sodium 139 135 - 146 mmol/L   Potassium 5.3 3.5 - 5.3 mmol/L   Chloride 102 98 - 110 mmol/L   CO2 28 20 - 31 mmol/L   Glucose, Bld 149 (H) 65 - 99 mg/dL   BUN 14 7 - 25 mg/dL   Creat 1.22 0.70 - 1.25 mg/dL   Total Bilirubin 0.7 0.2 - 1.2 mg/dL   Alkaline Phosphatase 47 40 - 115 U/L   AST 20 10 - 35 U/L   ALT 14 9 - 46 U/L   Total Protein 7.1 6.1 - 8.1 g/dL   Albumin 4.4 3.6 - 5.1 g/dL   Calcium 10.2 8.6 - 10.3 mg/dL   GFR, Est African American 70 >=60 mL/min   GFR, Est Non African American 61 >=60 mL/min  Hemoglobin A1c  Result Value Ref Range   Hgb A1c MFr Bld 7.2 (H) <5.7 %   Mean Plasma Glucose 160 mg/dL   Complete Blood Count (Most recent): Lab Results  Component Value Date   WBC 9.3 05/23/2015   HGB 12.7 (L) 05/23/2015   HCT 37.6 (L) 05/23/2015   MCV 87.2 05/23/2015   PLT 181 05/23/2015   Chemistry (most recent): Lab Results  Component Value Date   NA 139 02/29/2016   K 5.3 02/29/2016   CL 102 02/29/2016   CO2 28 02/29/2016   BUN 14 02/29/2016   CREATININE 1.22 02/29/2016   Diabetic Labs (most recent): Lab Results  Component Value Date   HGBA1C 7.2 (H) 02/29/2016   HGBA1C 7 11/03/2015   HGBA1C 7.3 (H) 06/19/2015      Assessment & Plan:   1. Type 2 diabetes mellitus with vascular disease (HCC)  - His diabetes is  complicated by coronary artery disease and patient remains at a high risk for more acute and chronic complications of diabetes which include CAD, CVA, CKD, retinopathy, and neuropathy. These are all discussed in detail with the patient.  Patient came with Controlled fasting glucose profile, and  recent A1c of 7 %.  Glucose logs and insulin administration records pertaining to this visit,  to be scanned into patient's records.  Recent labs  reviewed.   - I have re-counseled the patient on diet management and weight  loss  by adopting a carbohydrate restricted / protein rich  Diet.  - Suggestion is made for patient to avoid simple carbohydrates   from their diet including Cakes , Desserts, Ice Cream,  Soda (  diet and regular) , Sweet Tea , Candies,  Chips, Cookies, Artificial Sweeteners,   and "Sugar-free" Products .  This will help patient to have stable blood glucose profile and potentially avoid unintended  Weight gain.  - Patient is advised to stick to a routine mealtimes to eat 3 meals  a day and avoid unnecessary snacks ( to snack only to correct hypoglycemia).  - The patient  will be  scheduled with Jearld Fenton, RDN, CDE for individualized DM education.  - I have approached patient with the following individualized plan to manage diabetes and patient agrees.   -He will continue MTF 1gm po BID and Invokana 300 mg po qday. I will continue  Lantus to 30 units qhs associated with monitoring BG  BEFORE  BREAKFAST.  - Patient specific target  for A1c; LDL, HDL, Triglycerides, and  Waist Circumference were discussed in detail.  2) BP/HTN:  Controlled. Continue current medications including ACEI/ARB. 3) Lipids/HPL:  continue statins. 4)  Weight/Diet: CDE consult in progress, exercise, and carbohydrates information provided.  5) Chronic Care/Health Maintenance:  -Patient  is on ACEI/ARB and Statin medications and encouraged to continue to follow up with Ophthalmology, Podiatrist at least yearly or according to recommendations, and advised to  stay away from smoking. I have recommended yearly flu vaccine and pneumonia vaccination at least every 5 years; moderate intensity exercise for up to 150 minutes weekly; and  sleep for at least 7 hours a day.  - 25 minutes of time was spent on the care of this patient , 50% of which was applied for counseling on diabetes complications and their preventions.  - I advised patient to  maintain close follow up with Glo Herring., MD for primary care needs.  Patient is asked to bring meter and  blood glucose logs during their next visit.   Follow up plan: -Return in about 3 months (around 06/07/2016) for follow up with pre-visit labs, meter, and logs.  Glade Lloyd, MD Phone: [phone]  Fax: [phone]   03/07/2016, 2:48 PM

## 2016-03-14 ENCOUNTER — Other Ambulatory Visit: Payer: Self-pay | Admitting: "Endocrinology

## 2016-03-16 ENCOUNTER — Ambulatory Visit (INDEPENDENT_AMBULATORY_CARE_PROVIDER_SITE_OTHER): Payer: Medicare Other | Admitting: *Deleted

## 2016-03-16 DIAGNOSIS — I255 Ischemic cardiomyopathy: Secondary | ICD-10-CM

## 2016-03-17 NOTE — Addendum Note (Signed)
Addended by: Lianne Cure A on: 03/17/2016 12:52 PM   Modules accepted: Orders

## 2016-03-17 NOTE — Progress Notes (Signed)
Remote ICD transmission.   

## 2016-03-29 ENCOUNTER — Encounter: Payer: Self-pay | Admitting: Cardiology

## 2016-04-08 ENCOUNTER — Other Ambulatory Visit: Payer: Self-pay

## 2016-04-08 MED ORDER — FORA LANCETS MISC: 5 refills | Status: AC

## 2016-04-08 MED ORDER — GLUCOSE BLOOD VI STRP: ORAL_STRIP | 5 refills | Status: DC

## 2016-04-13 ENCOUNTER — Encounter: Payer: Self-pay | Admitting: Cardiology

## 2016-04-28 LAB — CUP PACEART REMOTE DEVICE CHECK
Battery Remaining Longevity: 55 mo
Brady Statistic RV Percent Paced: 1 %
HighPow Impedance: 49 Ohm
Implantable Lead Implant Date: 20101028
Implantable Lead Location: 753860
Lead Channel Pacing Threshold Amplitude: 0.75 V
Lead Channel Pacing Threshold Pulse Width: 0.4 ms
Lead Channel Setting Pacing Pulse Width: 0.4 ms
MDC IDC MSMT BATTERY REMAINING PERCENTAGE: 46 %
MDC IDC MSMT BATTERY VOLTAGE: 2.92 V
MDC IDC MSMT LEADCHNL RV IMPEDANCE VALUE: 460 Ohm
MDC IDC MSMT LEADCHNL RV SENSING INTR AMPL: 11.9 mV
MDC IDC PG IMPLANT DT: 20101028
MDC IDC SESS DTM: 20171129090200
MDC IDC SET LEADCHNL RV PACING AMPLITUDE: 2.5 V
MDC IDC SET LEADCHNL RV SENSING SENSITIVITY: 0.5 mV
Pulse Gen Serial Number: 737526

## 2016-06-08 ENCOUNTER — Ambulatory Visit: Payer: Medicare Other | Admitting: "Endocrinology

## 2016-06-14 ENCOUNTER — Other Ambulatory Visit: Payer: Self-pay | Admitting: "Endocrinology

## 2016-06-14 DIAGNOSIS — E1159 Type 2 diabetes mellitus with other circulatory complications: Secondary | ICD-10-CM | POA: Diagnosis not present

## 2016-06-14 DIAGNOSIS — E782 Mixed hyperlipidemia: Secondary | ICD-10-CM | POA: Diagnosis not present

## 2016-06-15 ENCOUNTER — Ambulatory Visit (INDEPENDENT_AMBULATORY_CARE_PROVIDER_SITE_OTHER): Payer: Medicare Other | Admitting: *Deleted

## 2016-06-15 DIAGNOSIS — I255 Ischemic cardiomyopathy: Secondary | ICD-10-CM

## 2016-06-15 LAB — COMPREHENSIVE METABOLIC PANEL
ALK PHOS: 51 U/L (ref 40–115)
ALT: 18 U/L (ref 9–46)
AST: 18 U/L (ref 10–35)
Albumin: 4 g/dL (ref 3.6–5.1)
BUN: 7 mg/dL (ref 7–25)
CALCIUM: 9.4 mg/dL (ref 8.6–10.3)
CO2: 27 mmol/L (ref 20–31)
Chloride: 104 mmol/L (ref 98–110)
Creat: 1.06 mg/dL (ref 0.70–1.25)
Glucose, Bld: 157 mg/dL — ABNORMAL HIGH (ref 65–99)
POTASSIUM: 5.1 mmol/L (ref 3.5–5.3)
Sodium: 140 mmol/L (ref 135–146)
Total Bilirubin: 0.4 mg/dL (ref 0.2–1.2)
Total Protein: 6.8 g/dL (ref 6.1–8.1)

## 2016-06-15 LAB — HEMOGLOBIN A1C
Hgb A1c MFr Bld: 8.7 % — ABNORMAL HIGH (ref <5.7)
Mean Plasma Glucose: 203 mg/dL

## 2016-06-15 LAB — LIPID PANEL
CHOL/HDL RATIO: 7.8 ratio — AB (ref <5.0)
CHOLESTEROL: 241 mg/dL — AB (ref <200)
HDL: 31 mg/dL — ABNORMAL LOW (ref >40)
Triglycerides: 434 mg/dL — ABNORMAL HIGH (ref <150)

## 2016-06-15 NOTE — Progress Notes (Signed)
Remote ICD transmission.   

## 2016-06-16 ENCOUNTER — Encounter: Payer: Self-pay | Admitting: Cardiology

## 2016-06-21 ENCOUNTER — Ambulatory Visit (INDEPENDENT_AMBULATORY_CARE_PROVIDER_SITE_OTHER): Payer: Medicare Other | Admitting: "Endocrinology

## 2016-06-21 ENCOUNTER — Encounter: Payer: Self-pay | Admitting: "Endocrinology

## 2016-06-21 VITALS — BP 132/93 | HR 62 | Ht 72.0 in | Wt 192.0 lb

## 2016-06-21 DIAGNOSIS — I255 Ischemic cardiomyopathy: Secondary | ICD-10-CM

## 2016-06-21 DIAGNOSIS — E782 Mixed hyperlipidemia: Secondary | ICD-10-CM | POA: Diagnosis not present

## 2016-06-21 DIAGNOSIS — E1159 Type 2 diabetes mellitus with other circulatory complications: Secondary | ICD-10-CM | POA: Diagnosis not present

## 2016-06-21 DIAGNOSIS — I10 Essential (primary) hypertension: Secondary | ICD-10-CM

## 2016-06-21 LAB — CUP PACEART REMOTE DEVICE CHECK
Battery Remaining Longevity: 54 mo
Battery Remaining Percentage: 44 %
Brady Statistic RV Percent Paced: 1 %
HIGH POWER IMPEDANCE MEASURED VALUE: 47 Ohm
Lead Channel Impedance Value: 450 Ohm
Lead Channel Pacing Threshold Pulse Width: 0.4 ms
Lead Channel Sensing Intrinsic Amplitude: 11.9 mV
Lead Channel Setting Pacing Amplitude: 2.5 V
Lead Channel Setting Pacing Pulse Width: 0.4 ms
MDC IDC LEAD IMPLANT DT: 20101028
MDC IDC LEAD LOCATION: 753860
MDC IDC MSMT BATTERY VOLTAGE: 2.92 V
MDC IDC MSMT LEADCHNL RV PACING THRESHOLD AMPLITUDE: 0.75 V
MDC IDC PG IMPLANT DT: 20101028
MDC IDC PG SERIAL: 737526
MDC IDC SESS DTM: 20180228092940
MDC IDC SET LEADCHNL RV SENSING SENSITIVITY: 0.5 mV

## 2016-06-21 MED ORDER — INSULIN GLARGINE 100 UNIT/ML SOLOSTAR PEN: PEN_INJECTOR | SUBCUTANEOUS | 2 refills | Status: DC

## 2016-06-21 MED ORDER — NIACIN ER (ANTIHYPERLIPIDEMIC) 1000 MG PO TBCR: 1000.0000 mg | EXTENDED_RELEASE_TABLET | Freq: Every day | ORAL | 3 refills | Status: DC

## 2016-06-21 NOTE — Patient Instructions (Signed)

## 2016-06-21 NOTE — Progress Notes (Signed)
Subjective:    Patient ID: Jordan Ross, male    DOB: July 03, 1945, PCP [name]., MD   Past Medical History:  Diagnosis Date  . Abdominal aortic aneurysm (HCC)   . AICD (automatic cardioverter/defibrillator) present    [employer]  . Anemia    iron deficiency anemia,takes iron pill daily  . Arthritis   . Coronary artery disease    AWMI 2001  . Diverticulosis   . GERD (gastroesophageal reflux disease)    takes Protonix daily  . History of colon polyps    benign  . History of migraine 40 yrs ago  . History of shingles   . Hyperlipidemia    takes Atorvastatin and Niacin daily  . Hypertension    takes Diovan,Spironolactone, and Carvedilol daily  . ICD (implantable cardioverter-defibrillator), single, in situ   . Ischemic cardiomyopathy   . Joint swelling   . LV dysfunction    takes Digoxin daily  . Myocardial infarction 2001  . Pneumonia 40 yrs ago   hx of  . Shortness of breath dyspnea    with exertion.States he was a smoker for yr  . Status post coronary artery bypass grafting   . Type 2 diabetes mellitus (HCC)    takes Glipizide,Metformin,Invokana,and Lantus daily.Average fasting blood sugar runs about 140   Past Surgical History:  Procedure Laterality Date  . ABDOMINAL AORTIC ENDOVASCULAR STENT GRAFT N/A 05/22/2015   Procedure: ABDOMINAL AORTIC ENDOVASCULAR STENT GRAFT;  Surgeon: Nada Libman, MD;  Location: [facility] OR;  Service: Vascular;  Laterality: N/A;  . CARDIAC CATHETERIZATION  2001  . COLONOSCOPY N/A 03/21/2014   Procedure: COLONOSCOPY;  Surgeon: Malissa Hippo, MD;  Location: [facility] SUITE;  Service: Endoscopy;  Laterality: N/A;  855 - moved to 12/4 @ 8:30 - Ann notified pt  . CORONARY ARTERY BYPASS GRAFT  2001   unsure if it was 3 or 4  . DOPPLER ECHOCARDIOGRAPHY     2011 38% EF.2013 EF 30-35%   Social History   Social History  . Marital status: Widowed    Spouse name: N/A  . Number of children: N/A  . Years of education: N/A   Social  History Main Topics  . Smoking status: Former Smoker    Years: 40.00  . Smokeless tobacco: Never Used     Comment: quit smoking in 2001  . Alcohol use No  . Drug use: No  . Sexual activity: Yes   Other Topics Concern  . None   Social History Narrative  . None   Outpatient Encounter Prescriptions as of 06/21/2016  Medication Sig  . aspirin 81 MG tablet Take 81 mg by mouth daily.   [name] atorvastatin (LIPITOR) 40 MG tablet Take 40 mg by mouth daily.  . carvedilol (COREG) 12.5 MG tablet Take 12.5 mg by mouth 2 (two) times daily.  . digoxin (LANOXIN) 0.125 MG tablet Take 125 mcg by mouth daily.  [name] FORA LANCETS MISC Test bid as directed. E11.65.  [name] glucose blood (FORA V10 BLOOD GLUCOSE TEST) test strip Use as instructed bid. E11.65  . glucose blood test strip Use as instructed  . Insulin Glargine (LANTUS SOLOSTAR) 100 UNIT/ML Solostar Pen INJECT 40 UNITS SUBCUTANEOUSLY DAILY AT BEDTIME.  [name] INVOKANA 300 MG TABS tablet Take 300 mg by mouth daily before breakfast.   . IRON PO Take 65 mg by mouth daily.  [name] LANCETS ULTRA THIN MISC Test BG every morning  . LITETOUCH PEN NEEDLES 31G X 8 MM MISC  USE AT BEDTIME AS DIRECTED.  [name] metFORMIN (GLUCOPHAGE) 500 MG tablet Take 1,000 mg by mouth 2 (two) times daily with a meal.   . niacin (NIASPAN) 1000 MG CR tablet Take 1 tablet (1,000 mg total) by mouth daily.  . pantoprazole (PROTONIX) 40 MG tablet Take 40 mg by mouth daily.  [name] spironolactone (ALDACTONE) 25 MG tablet Take 25 mg by mouth daily.  . valsartan (DIOVAN) 160 MG tablet Take 160 mg by mouth daily.  . [DISCONTINUED] LANTUS SOLOSTAR 100 UNIT/ML Solostar Pen INJECT 30 UNITS SUBCUTANEOUSLY DAILY AT BEDTIME.  . [DISCONTINUED] niacin (NIASPAN) 500 MG CR tablet Take 500 mg by mouth daily.   No facility-administered encounter medications on file as of 06/21/2016.    ALLERGIES: No Known Allergies VACCINATION STATUS:  There is no immunization history on file for this patient.  Diabetes  He presents  for his follow-up diabetic visit. He has type 2 diabetes mellitus. Onset time: He was diagnosed at approximate age of 17 years. His disease course has been worsening. There are no hypoglycemic associated symptoms. Pertinent negatives for hypoglycemia include no confusion, headaches, pallor or seizures. There are no diabetic associated symptoms. Pertinent negatives for diabetes include no chest pain, no fatigue, no polydipsia, no polyphagia, no polyuria and no weakness. There are no hypoglycemic complications. Symptoms are worsening. Diabetic complications include heart disease. Risk factors for coronary artery disease include diabetes mellitus, dyslipidemia, hypertension, male sex, sedentary lifestyle and tobacco exposure. Current diabetic treatment includes insulin injections and oral agent (dual therapy). He is compliant with treatment most of the time. His weight is increasing steadily. He is following a generally unhealthy diet. He has had a previous visit with a dietitian. His home blood glucose trend is decreasing steadily. His breakfast blood glucose range is generally 140-180 mg/dl. An ACE inhibitor/angiotensin II receptor blocker is being taken. Eye exam is not current.  Hyperlipidemia  This is a chronic problem. The current episode started more than 1 year ago. Pertinent negatives include no chest pain, myalgias or shortness of breath. Current antihyperlipidemic treatment includes statins.  Hypertension  This is a chronic problem. The current episode started more than 1 year ago. Pertinent negatives include no chest pain, headaches, neck pain, palpitations or shortness of breath. Risk factors for coronary artery disease include dyslipidemia. Past treatments include ACE inhibitors.     Review of Systems  Constitutional: Negative for chills, fatigue, fever and unexpected weight change.  HENT: Negative for dental problem, mouth sores and trouble swallowing.   Eyes: Negative for visual disturbance.   Respiratory: Negative for cough, choking, chest tightness, shortness of breath and wheezing.   Cardiovascular: Negative for chest pain, palpitations and leg swelling.  Gastrointestinal: Negative for abdominal distention, abdominal pain, constipation, diarrhea, nausea and vomiting.  Endocrine: Negative for polydipsia, polyphagia and polyuria.  Genitourinary: Negative for dysuria, flank pain, hematuria and urgency.  Musculoskeletal: Negative for back pain, gait problem, myalgias and neck pain.  Skin: Negative for pallor, rash and wound.  Neurological: Negative for seizures, syncope, weakness, numbness and headaches.  Psychiatric/Behavioral: Negative.  Negative for confusion and dysphoric mood.    Objective:    BP (!) 132/93   Pulse 62   Ht 6' (1.829 m)   Wt 192 lb (87.1 kg)   BMI 26.04 kg/m   Wt Readings from Last 3 Encounters:  06/21/16 192 lb (87.1 kg)  03/07/16 186 lb (84.4 kg)  01/11/16 188 lb (85.3 kg)    Physical Exam  Constitutional: He is oriented to  person, place, and time. He appears well-developed and well-nourished. He is cooperative. No distress.  HENT:  Head: Normocephalic and atraumatic.  Eyes: EOM are normal.  Neck: Normal range of motion. Neck supple. No tracheal deviation present. No thyromegaly present.  Cardiovascular: Normal rate, S1 normal, S2 normal and normal heart sounds.  Exam reveals no gallop.   No murmur heard. Pulses:      Dorsalis pedis pulses are 1+ on the right side, and 1+ on the left side.       Posterior tibial pulses are 1+ on the right side, and 1+ on the left side.  Pulmonary/Chest: Breath sounds normal. No respiratory distress. He has no wheezes.  Abdominal: Soft. Bowel sounds are normal. He exhibits no distension. There is no tenderness. There is no guarding and no CVA tenderness.  Musculoskeletal: He exhibits no edema.       Right shoulder: He exhibits no swelling and no deformity.  Left foot in a cast.  Neurological: He is alert and  oriented to person, place, and time. He has normal strength and normal reflexes. No cranial nerve deficit or sensory deficit. Gait normal.  Skin: Skin is warm and dry. No rash noted. No cyanosis. Nails show no clubbing.  Psychiatric: He has a normal mood and affect. His speech is normal and behavior is normal. Judgment and thought content normal. Cognition and memory are normal.    Results for orders placed or performed in visit on 06/15/16  CUP PACEART REMOTE DEVICE CHECK  Result Value Ref Range   Date Time Interrogation Session [id number]    Pulse Generator Manufacturer [employer]    Pulse Gen Model 1231-40Q Fortify VR    Pulse Gen Serial Number [id number]    Clinic Name [facility]    Implantable Pulse Generator Type Implantable Cardiac Defibulator    Implantable Pulse Generator Implant Date 41660630    Implantable Lead Manufacturer [facility]    Implantable Lead Model [id number] Durata SJ4    Implantable Lead Serial Number [id number]    Implantable Lead Implant Date 93235573    Implantable Lead Location Detail 1 APEX    Implantable Lead Location [id number]    Lead Channel Setting Sensing Sensitivity 0.5 mV   Lead Channel Setting Sensing Adaptation Mode Adaptive Sensing    Lead Channel Setting Pacing Pulse Width 0.4 ms   Lead Channel Setting Pacing Amplitude 2.5 V   Lead Channel Status NULL    Lead Channel Impedance Value 450 ohm   Lead Channel Sensing Intrinsic Amplitude 11.9 mV   Lead Channel Pacing Threshold Amplitude 0.75 V   Lead Channel Pacing Threshold Pulse Width 0.4 ms   HighPow Impedance 47 ohm   HighPow Imped Status NULL    Battery Status MOS    Battery Remaining Longevity 54 mo   Battery Remaining Percentage 44.0 %   Battery Voltage 2.92 V   Brady Statistic RV Percent Paced 1.0 %   Eval Rhythm VS    Complete Blood Count (Most recent): Lab Results  Component Value Date   WBC 9.3 05/23/2015   HGB 12.7 (L) 05/23/2015   HCT 37.6 (L) 05/23/2015   MCV 87.2 05/23/2015   PLT 181  05/23/2015   Diabetic Labs (most recent): Lab Results  Component Value Date   HGBA1C 8.7 (H) 06/14/2016   HGBA1C 7.2 (H) 02/29/2016   HGBA1C 7 11/03/2015     Assessment & Plan:   1. Type 2 diabetes mellitus with vascular disease (HCC)  - His diabetes is  complicated  by coronary artery disease and patient remains at a high risk for more acute and chronic complications of diabetes which include CAD, CVA, CKD, retinopathy, and neuropathy. These are all discussed in detail with the patient.  Patient came with Near target fasting glucose profile, however he is A1c is higher at 8.7% indicating loss of control. -  Glucose logs and insulin administration records pertaining to this visit,  to be scanned into patient's records.  Recent labs reviewed.   - I have re-counseled the patient on diet management and weight loss  by adopting a carbohydrate restricted / protein rich  Diet.  - Suggestion is made for patient to avoid simple carbohydrates   from their diet including Cakes , Desserts, Ice Cream,  Soda (  diet and regular) , Sweet Tea , Candies,  Chips, Cookies, Artificial Sweeteners,   and "Sugar-free" Products .  This will help patient to have stable blood glucose profile and potentially avoid unintended  Weight gain.  - Patient is advised to stick to a routine mealtimes to eat 3 meals  a day and avoid unnecessary snacks ( to snack only to correct hypoglycemia).  - The patient  will be  scheduled with Norm Salt, RDN, CDE for individualized DM education.  - I have approached patient with the following individualized plan to manage diabetes and patient agrees.   -He will continue MTF 1gm po BID and Invokana 300 mg po qday. I will increase Lantus to 40 units daily at bedtime associated with monitoring of blood glucose daily before breakfast.  - Patient specific target  for A1c; LDL, HDL, Triglycerides, and  Waist Circumference were discussed in detail.  2) BP/HTN:  Controlled.  Continue current medications including ACEI/ARB. 3) Lipids/HPL:  Uncontrolled with significant hypertriglyceridemia of 434. I advised him to continue atorvastatin 40 mg by mouth daily at bedtime and increase Niaspan to 1000 mg by mouth daily at bedtime.  4)  Weight/Diet: He has gained 6 pounds since last visit, CDE consult in progress, exercise, and carbohydrates information provided.  5) Chronic Care/Health Maintenance:  -Patient  is on ACEI/ARB and Statin medications and encouraged to continue to follow up with Ophthalmology, Podiatrist at least yearly or according to recommendations, and advised to  stay away from smoking. I have recommended yearly flu vaccine and pneumonia vaccination at least every 5 years; moderate intensity exercise for up to 150 minutes weekly; and  sleep for at least 7 hours a day.  - 25 minutes of time was spent on the care of this patient , 50% of which was applied for counseling on diabetes complications and their preventions.  - I advised patient to maintain close follow up with [name]., MD for primary care needs.  Patient is asked to bring meter and  blood glucose logs during their next visit.   Follow up plan: -Return in about 3 months (around 09/21/2016) for follow up with pre-visit labs, meter, and logs.  Marquis Lunch, MD Phone: [phone]  Fax: [phone]   06/21/2016, 1:47 PM

## 2016-06-30 ENCOUNTER — Encounter: Payer: Self-pay | Admitting: Cardiology

## 2016-07-26 ENCOUNTER — Telehealth: Payer: Self-pay | Admitting: Cardiology

## 2016-07-26 NOTE — Telephone Encounter (Signed)
LMOVM requesting that pt send manual transmission b/c home monitor has not updated in at least 7 days.    

## 2016-08-27 ENCOUNTER — Other Ambulatory Visit: Payer: Self-pay | Admitting: "Endocrinology

## 2016-09-14 ENCOUNTER — Ambulatory Visit (INDEPENDENT_AMBULATORY_CARE_PROVIDER_SITE_OTHER): Payer: Medicare Other | Admitting: *Deleted

## 2016-09-14 DIAGNOSIS — I255 Ischemic cardiomyopathy: Secondary | ICD-10-CM | POA: Diagnosis not present

## 2016-09-16 NOTE — Progress Notes (Signed)
Remote ICD transmission.   

## 2016-09-21 ENCOUNTER — Ambulatory Visit: Payer: Medicare Other | Admitting: "Endocrinology

## 2016-09-23 ENCOUNTER — Encounter: Payer: Self-pay | Admitting: Cardiology

## 2016-09-27 LAB — CUP PACEART REMOTE DEVICE CHECK
HighPow Impedance: 47 Ohm
Implantable Lead Implant Date: 20101028
Implantable Lead Location: 753860
Implantable Pulse Generator Implant Date: 20101028
Lead Channel Pacing Threshold Amplitude: 0.75 V
Lead Channel Sensing Intrinsic Amplitude: 11.9 mV
Lead Channel Setting Sensing Sensitivity: 0.5 mV
MDC IDC MSMT BATTERY REMAINING LONGEVITY: 50 mo
MDC IDC MSMT BATTERY REMAINING PERCENTAGE: 42 %
MDC IDC MSMT BATTERY VOLTAGE: 2.92 V
MDC IDC MSMT LEADCHNL RV IMPEDANCE VALUE: 450 Ohm
MDC IDC MSMT LEADCHNL RV PACING THRESHOLD PULSEWIDTH: 0.4 ms
MDC IDC SESS DTM: 20180530080251
MDC IDC SET LEADCHNL RV PACING AMPLITUDE: 2.5 V
MDC IDC SET LEADCHNL RV PACING PULSEWIDTH: 0.4 ms
MDC IDC STAT BRADY RV PERCENT PACED: 1 %
Pulse Gen Serial Number: 737526

## 2016-10-07 ENCOUNTER — Encounter: Payer: Self-pay | Admitting: Cardiology

## 2016-10-25 ENCOUNTER — Other Ambulatory Visit: Payer: Self-pay | Admitting: "Endocrinology

## 2016-10-25 DIAGNOSIS — E1159 Type 2 diabetes mellitus with other circulatory complications: Secondary | ICD-10-CM | POA: Diagnosis not present

## 2016-10-26 LAB — COMPREHENSIVE METABOLIC PANEL
ALT: 32 U/L (ref 9–46)
AST: 28 U/L (ref 10–35)
Albumin: 4.5 g/dL (ref 3.6–5.1)
Alkaline Phosphatase: 49 U/L (ref 40–115)
BUN: 12 mg/dL (ref 7–25)
CHLORIDE: 100 mmol/L (ref 98–110)
CO2: 23 mmol/L (ref 20–31)
CREATININE: 1.42 mg/dL — AB (ref 0.70–1.25)
Calcium: 10 mg/dL (ref 8.6–10.3)
GLUCOSE: 197 mg/dL — AB (ref 65–99)
POTASSIUM: 5.3 mmol/L (ref 3.5–5.3)
SODIUM: 137 mmol/L (ref 135–146)
TOTAL PROTEIN: 7.3 g/dL (ref 6.1–8.1)
Total Bilirubin: 0.5 mg/dL (ref 0.2–1.2)

## 2016-10-26 LAB — HEMOGLOBIN A1C
HEMOGLOBIN A1C: 8.4 % — AB (ref <5.7)
Mean Plasma Glucose: 194 mg/dL

## 2016-11-07 ENCOUNTER — Encounter: Payer: Self-pay | Admitting: "Endocrinology

## 2016-11-07 ENCOUNTER — Ambulatory Visit (INDEPENDENT_AMBULATORY_CARE_PROVIDER_SITE_OTHER): Payer: Medicare Other | Admitting: "Endocrinology

## 2016-11-07 VITALS — BP 127/80 | HR 74 | Ht 72.0 in | Wt 191.0 lb

## 2016-11-07 DIAGNOSIS — E782 Mixed hyperlipidemia: Secondary | ICD-10-CM

## 2016-11-07 DIAGNOSIS — I255 Ischemic cardiomyopathy: Secondary | ICD-10-CM | POA: Diagnosis not present

## 2016-11-07 DIAGNOSIS — E1159 Type 2 diabetes mellitus with other circulatory complications: Secondary | ICD-10-CM

## 2016-11-07 DIAGNOSIS — I10 Essential (primary) hypertension: Secondary | ICD-10-CM

## 2016-11-07 MED ORDER — INSULIN GLARGINE 100 UNIT/ML SOLOSTAR PEN: 50.0000 [IU] | PEN_INJECTOR | Freq: Every day | SUBCUTANEOUS | 2 refills | Status: DC

## 2016-11-07 MED ORDER — EMPAGLIFLOZIN 25 MG PO TABS: 25.0000 mg | ORAL_TABLET | Freq: Every day | ORAL | 3 refills | Status: DC

## 2016-11-07 NOTE — Progress Notes (Signed)
Subjective:    Patient ID: Jordan Ross, male    DOB: [birth year], PCP Redmond School, MD   Past Medical History:  Diagnosis Date  . Abdominal aortic aneurysm ([place])   . AICD (automatic cardioverter/defibrillator) present    [employer]  . Anemia    iron deficiency anemia,takes iron pill daily  . Arthritis   . Coronary artery disease    AWMI 2001  . Diverticulosis   . GERD (gastroesophageal reflux disease)    takes Protonix daily  . History of colon polyps    benign  . History of migraine 40 yrs ago  . History of shingles   . Hyperlipidemia    takes Atorvastatin and Niacin daily  . Hypertension    takes Diovan,Spironolactone, and Carvedilol daily  . ICD (implantable cardioverter-defibrillator), single, in situ   . Ischemic cardiomyopathy   . Joint swelling   . LV dysfunction    takes Digoxin daily  . Myocardial infarction ([place]) 2001  . Pneumonia 40 yrs ago   hx of  . Shortness of breath dyspnea    with exertion.States he was a smoker for yr  . Status post coronary artery bypass grafting   . Type 2 diabetes mellitus (HCC)    takes Glipizide,Metformin,Invokana,and Lantus daily.Average fasting blood sugar runs about 140   Past Surgical History:  Procedure Laterality Date  . ABDOMINAL AORTIC ENDOVASCULAR STENT GRAFT N/A 05/22/2015   Procedure: ABDOMINAL AORTIC ENDOVASCULAR STENT GRAFT;  Surgeon: Serafina Mitchell, MD;  Location: [facility];  Service: Vascular;  Laterality: N/A;  . CARDIAC CATHETERIZATION  2001  . COLONOSCOPY N/A 03/21/2014   Procedure: COLONOSCOPY;  Surgeon: Rogene Houston, MD;  [facility];  Service: Endoscopy;  Laterality: N/A;  855 - moved to 12/4 @ 8:30 - Ann notified pt  . CORONARY ARTERY BYPASS GRAFT  2001   unsure if it was 3 or 4  . DOPPLER ECHOCARDIOGRAPHY     2011 38% EF.2013 EF 30-35%   Social History   Social History  . Marital status: Widowed    Spouse name: N/A  . Number of children: N/A  . Years of education: N/A   Social  History Main Topics  . Smoking status: Former Smoker    Years: 40.00  . Smokeless tobacco: Never Used     Comment: quit smoking in 2001  . Alcohol use No  . Drug use: No  . Sexual activity: Yes   Other Topics Concern  . None   Social History Narrative  . None   Outpatient Encounter Prescriptions as of 11/07/2016  Medication Sig  . aspirin 81 MG tablet Take 81 mg by mouth daily.   [name] atorvastatin (LIPITOR) 40 MG tablet Take 40 mg by mouth daily.  . carvedilol (COREG) 12.5 MG tablet Take 12.5 mg by mouth 2 (two) times daily.  . digoxin (LANOXIN) 0.125 MG tablet Take 125 mcg by mouth daily.  . empagliflozin (JARDIANCE) 25 MG TABS tablet Take 25 mg by mouth daily.  [name] FORA LANCETS MISC Test bid as directed. E11.65.  [name] glucose blood (FORA V10 BLOOD GLUCOSE TEST) test strip Use as instructed bid. E11.65  . glucose blood test strip Use as instructed  . Insulin Glargine (LANTUS SOLOSTAR) 100 UNIT/ML Solostar Pen Inject 50 Units into the skin at bedtime.  . IRON PO Take 65 mg by mouth daily.  [name] LANCETS ULTRA THIN MISC Test BG every morning  . LITETOUCH PEN NEEDLES 31G X 8 MM MISC  USE AT BEDTIME AS DIRECTED.  [name] metFORMIN (GLUCOPHAGE) 500 MG tablet Take 1,000 mg by mouth 2 (two) times daily with a meal.   . niacin (NIASPAN) 1000 MG CR tablet Take 1 tablet (1,000 mg total) by mouth daily.  . pantoprazole (PROTONIX) 40 MG tablet Take 40 mg by mouth daily.  [name] spironolactone (ALDACTONE) 25 MG tablet Take 25 mg by mouth daily.  . valsartan (DIOVAN) 160 MG tablet Take 160 mg by mouth daily.  . [DISCONTINUED] Insulin Glargine (LANTUS SOLOSTAR) 100 UNIT/ML Solostar Pen Inject 40 Units into the skin at bedtime.  . [DISCONTINUED] INVOKANA 300 MG TABS tablet Take 300 mg by mouth daily before breakfast.    No facility-administered encounter medications on file as of 11/07/2016.    ALLERGIES: No Known Allergies VACCINATION STATUS:  There is no immunization history on file for this patient.  Diabetes   He presents for his follow-up diabetic visit. He has type 2 diabetes mellitus. Onset time: He was diagnosed at approximate age of 53 years. His disease course has been worsening. There are no hypoglycemic associated symptoms. Pertinent negatives for hypoglycemia include no confusion, headaches, pallor or seizures. There are no diabetic associated symptoms. Pertinent negatives for diabetes include no chest pain, no fatigue, no polydipsia, no polyphagia, no polyuria and no weakness. There are no hypoglycemic complications. Symptoms are worsening. Diabetic complications include heart disease. Risk factors for coronary artery disease include diabetes mellitus, dyslipidemia, hypertension, male sex, sedentary lifestyle and tobacco exposure. Current diabetic treatment includes insulin injections and oral agent (dual therapy). He is compliant with treatment most of the time. His weight is increasing steadily. He is following a generally unhealthy diet. He has had a previous visit with a dietitian. His home blood glucose trend is decreasing steadily. His breakfast blood glucose range is generally 140-180 mg/dl. An ACE inhibitor/angiotensin II receptor blocker is being taken. Eye exam is not current.  Hyperlipidemia  This is a chronic problem. The current episode started more than 1 year ago. Pertinent negatives include no chest pain, myalgias or shortness of breath. Current antihyperlipidemic treatment includes statins.  Hypertension  This is a chronic problem. The current episode started more than 1 year ago. Pertinent negatives include no chest pain, headaches, neck pain, palpitations or shortness of breath. Risk factors for coronary artery disease include dyslipidemia. Past treatments include ACE inhibitors.     Review of Systems  Constitutional: Negative for chills, fatigue, fever and unexpected weight change.  HENT: Negative for dental problem, mouth sores and trouble swallowing.   Eyes: Negative for visual  disturbance.  Respiratory: Negative for cough, choking, chest tightness, shortness of breath and wheezing.   Cardiovascular: Negative for chest pain, palpitations and leg swelling.  Gastrointestinal: Negative for abdominal distention, abdominal pain, constipation, diarrhea, nausea and vomiting.  Endocrine: Negative for polydipsia, polyphagia and polyuria.  Genitourinary: Negative for dysuria, flank pain, hematuria and urgency.  Musculoskeletal: Negative for back pain, gait problem, myalgias and neck pain.  Skin: Negative for pallor, rash and wound.  Neurological: Negative for seizures, syncope, weakness, numbness and headaches.  Psychiatric/Behavioral: Negative.  Negative for confusion and dysphoric mood.    Objective:    BP 127/80   Pulse 74   Ht 6' (1.829 m)   Wt 191 lb (86.6 kg)   BMI 25.90 kg/m   Wt Readings from Last 3 Encounters:  11/07/16 191 lb (86.6 kg)  06/21/16 192 lb (87.1 kg)  03/07/16 186 lb (84.4 kg)    Physical Exam  Constitutional:  He is oriented to person, place, and time. He appears well-developed and well-nourished. He is cooperative. No distress.  HENT:  Head: Normocephalic and atraumatic.  Eyes: EOM are normal.  Neck: Normal range of motion. Neck supple. No tracheal deviation present. No thyromegaly present.  Cardiovascular: Normal rate, S1 normal, S2 normal and normal heart sounds.  Exam reveals no gallop.   No murmur heard. Pulses:      Dorsalis pedis pulses are 1+ on the right side, and 1+ on the left side.       Posterior tibial pulses are 1+ on the right side, and 1+ on the left side.  Pulmonary/Chest: Breath sounds normal. No respiratory distress. He has no wheezes.  Abdominal: Soft. Bowel sounds are normal. He exhibits no distension. There is no tenderness. There is no guarding and no CVA tenderness.  Musculoskeletal: He exhibits no edema.       Right shoulder: He exhibits no swelling and no deformity.  Neurological: He is alert and oriented to  person, place, and time. He has normal strength and normal reflexes. No cranial nerve deficit or sensory deficit. Gait normal.  Skin: Skin is warm and dry. No rash noted. No cyanosis. Nails show no clubbing.  Psychiatric: He has a normal mood and affect. His speech is normal and behavior is normal. Judgment and thought content normal. Cognition and memory are normal.    Results for orders placed or performed in visit on 10/25/16  Comprehensive metabolic panel  Result Value Ref Range   Sodium 137 135 - 146 mmol/L   Potassium 5.3 3.5 - 5.3 mmol/L   Chloride 100 98 - 110 mmol/L   CO2 23 20 - 31 mmol/L   Glucose, Bld 197 (H) 65 - 99 mg/dL   BUN 12 7 - 25 mg/dL   Creat 1.42 (H) 0.70 - 1.25 mg/dL   Total Bilirubin 0.5 0.2 - 1.2 mg/dL   Alkaline Phosphatase 49 40 - 115 U/L   AST 28 10 - 35 U/L   ALT 32 9 - 46 U/L   Total Protein 7.3 6.1 - 8.1 g/dL   Albumin 4.5 3.6 - 5.1 g/dL   Calcium 10.0 8.6 - 10.3 mg/dL  Hemoglobin A1c  Result Value Ref Range   Hgb A1c MFr Bld 8.4 (H) <5.7 %   Mean Plasma Glucose 194 mg/dL   Complete Blood Count (Most recent): Lab Results  Component Value Date   WBC 9.3 05/23/2015   HGB 12.7 (L) 05/23/2015   HCT 37.6 (L) 05/23/2015   MCV 87.2 05/23/2015   PLT 181 05/23/2015   Diabetic Labs (most recent): Lab Results  Component Value Date   HGBA1C 8.4 (H) 10/25/2016   HGBA1C 8.7 (H) 06/14/2016   HGBA1C 7.2 (H) 02/29/2016     Assessment & Plan:   1. Type 2 diabetes mellitus with vascular disease (HCC)  - His diabetes is  complicated by coronary artery disease and patient remains at a high risk for more acute and chronic complications of diabetes which include CAD, CVA, CKD, retinopathy, and neuropathy. These are all discussed in detail with the patient.  Patient came with above target fasting glucose profile, his A1c is   Improving to 8.4%  from 8.7% .  -  Glucose logs and insulin administration records pertaining to this visit,  to be scanned into  patient's records.  Recent labs reviewed.   - I have re-counseled the patient on diet management and weight loss  by adopting a carbohydrate restricted /  protein rich  Diet.  - Suggestion is made for patient to avoid simple carbohydrates   from his diet including Cakes , Desserts, Ice Cream,  Soda (  diet and regular) , Sweet Tea , Candies,  Chips, Cookies, Artificial Sweeteners,   and "Sugar-free" Products .  This will help patient to have stable blood glucose profile and potentially avoid unintended  Weight gain.  - Patient is advised to stick to a routine mealtimes to eat 3 meals  a day and avoid unnecessary snacks ( to snack only to correct hypoglycemia).   - I have approached patient with the following individualized plan to manage diabetes and patient agrees.   -He will continue MTF 1gm po    2 times a day with meals and  Discontinue Invokana. -  he would benefit more from [place],  I will  Initiate 25 mg by mouth qam.  - I will increase Lantus to 50 units daily at bedtime associated with monitoring of blood glucose daily before breakfast.  - Patient specific target  for A1c; LDL, HDL, Triglycerides, and  Waist Circumference were discussed in detail.  2) BP/HTN:  Controlled. Continue current medications including ACEI/ARB. 3) Lipids/HPL:  Uncontrolled with significant hypertriglyceridemia of 434. I advised him to continue atorvastatin 40 mg by mouth daily at bedtime and increase Niaspan to 1000 mg by mouth daily at bedtime.  4)  Weight/Diet: He has gained 6 pounds since last visit, CDE consult in progress, exercise, and carbohydrates information provided.  5) Chronic Care/Health Maintenance:  -Patient  is on ACEI/ARB and Statin medications and encouraged to continue to follow up with Ophthalmology, Podiatrist at least yearly or according to recommendations, and advised to  stay away from smoking. I have recommended yearly flu vaccine and pneumonia vaccination at least every 5 years;  moderate intensity exercise for up to 150 minutes weekly; and  sleep for at least 7 hours a day.  - 25 minutes of time was spent on the care of this patient , 50% of which was applied for counseling on diabetes complications and their preventions.  - I advised patient to maintain close follow up with Redmond School, MD for primary care needs.  Patient is asked to bring meter and  blood glucose logs during his next visit.   Follow up plan: -Return in about 3 months (around 02/07/2017) for meter, and logs.  Glade Lloyd, MD Phone: [phone]  Fax: [phone]   11/07/2016, 3:41 PM

## 2016-11-07 NOTE — Patient Instructions (Signed)

## 2016-12-05 DIAGNOSIS — I2510 Atherosclerotic heart disease of native coronary artery without angina pectoris: Secondary | ICD-10-CM | POA: Diagnosis not present

## 2016-12-05 DIAGNOSIS — E782 Mixed hyperlipidemia: Secondary | ICD-10-CM | POA: Diagnosis not present

## 2016-12-05 DIAGNOSIS — E119 Type 2 diabetes mellitus without complications: Secondary | ICD-10-CM | POA: Diagnosis not present

## 2016-12-05 DIAGNOSIS — Z6826 Body mass index (BMI) 26.0-26.9, adult: Secondary | ICD-10-CM | POA: Diagnosis not present

## 2016-12-05 DIAGNOSIS — K219 Gastro-esophageal reflux disease without esophagitis: Secondary | ICD-10-CM | POA: Diagnosis not present

## 2016-12-05 DIAGNOSIS — Z1389 Encounter for screening for other disorder: Secondary | ICD-10-CM | POA: Diagnosis not present

## 2016-12-05 DIAGNOSIS — I10 Essential (primary) hypertension: Secondary | ICD-10-CM | POA: Diagnosis not present

## 2016-12-05 DIAGNOSIS — E663 Overweight: Secondary | ICD-10-CM | POA: Diagnosis not present

## 2016-12-14 ENCOUNTER — Encounter: Payer: Medicare Other | Admitting: *Deleted

## 2016-12-16 ENCOUNTER — Encounter: Payer: Self-pay | Admitting: Cardiology

## 2016-12-21 ENCOUNTER — Telehealth: Payer: Self-pay | Admitting: Cardiovascular Disease

## 2016-12-21 ENCOUNTER — Telehealth: Payer: Self-pay | Admitting: Cardiology

## 2016-12-21 NOTE — Telephone Encounter (Signed)
Noted  

## 2016-12-21 NOTE — Telephone Encounter (Signed)
New message    Pt calling stating that he can not send transmission because he is being sent a new machine. He said it will be 2 weeks before he receives it.

## 2016-12-21 NOTE — Telephone Encounter (Signed)
Spoke w/ pt and requested that he send a manual transmission b/c his home monitor has not updated in at least 7 days.   

## 2016-12-23 ENCOUNTER — Encounter: Payer: Self-pay | Admitting: Cardiovascular Disease

## 2016-12-23 ENCOUNTER — Ambulatory Visit (INDEPENDENT_AMBULATORY_CARE_PROVIDER_SITE_OTHER): Payer: Medicare Other | Admitting: Cardiovascular Disease

## 2016-12-23 VITALS — BP 140/78 | HR 58 | Ht 72.0 in | Wt 200.0 lb

## 2016-12-23 DIAGNOSIS — I714 Abdominal aortic aneurysm, without rupture, unspecified: Secondary | ICD-10-CM

## 2016-12-23 DIAGNOSIS — Z95810 Presence of automatic (implantable) cardiac defibrillator: Secondary | ICD-10-CM

## 2016-12-23 DIAGNOSIS — E1159 Type 2 diabetes mellitus with other circulatory complications: Secondary | ICD-10-CM

## 2016-12-23 DIAGNOSIS — E785 Hyperlipidemia, unspecified: Secondary | ICD-10-CM | POA: Diagnosis not present

## 2016-12-23 DIAGNOSIS — I5022 Chronic systolic (congestive) heart failure: Secondary | ICD-10-CM

## 2016-12-23 DIAGNOSIS — I2510 Atherosclerotic heart disease of native coronary artery without angina pectoris: Secondary | ICD-10-CM

## 2016-12-23 DIAGNOSIS — I10 Essential (primary) hypertension: Secondary | ICD-10-CM

## 2016-12-23 DIAGNOSIS — I255 Ischemic cardiomyopathy: Secondary | ICD-10-CM | POA: Diagnosis not present

## 2016-12-23 LAB — LIPID PANEL
CHOLESTEROL TOTAL: 126 mg/dL (ref 100–199)
Chol/HDL Ratio: 3.1 ratio (ref 0.0–5.0)
HDL: 41 mg/dL (ref >39)
LDL Calculated: 58 mg/dL (ref 0–99)
TRIGLYCERIDES: 136 mg/dL (ref 0–149)
VLDL Cholesterol Cal: 27 mg/dL (ref 5–40)

## 2016-12-23 NOTE — Patient Instructions (Signed)
Dr Sallyanne Kuster recommends that you continue on your current medications as directed. Please refer to the Current Medication list given to you today.  Your physician recommends that you return for lab work TODAY.  Remote monitoring is used to monitor your Pacemaker or ICD from home. This monitoring reduces the number of office visits required to check your device to one time per year. It allows [place] to keep an eye on the functioning of your device to ensure it is working properly. You are scheduled for a device check from home on Friday, December 7th, 2018. You may send your transmission at any time that day. If you have a wireless device, the transmission will be sent automatically. After your physician reviews your transmission, you will receive a notification with your next transmission date.  Your physician recommends that you schedule a follow-up appointment in 6 months with Dr Gwenlyn Found.  Dr Sallyanne Kuster recommends that you schedule a follow-up appointment in 12 months with a defibrillator check. You will receive a reminder letter in the mail two months in advance. If you don't receive a letter, please call our office to schedule the follow-up appointment.  If you need a refill on your cardiac medications before your next appointment, please call your pharmacy.

## 2016-12-23 NOTE — Progress Notes (Signed)
Cardiology Office Note    Date:  12/25/2016   ID:  [name], DOB 03-21-1946, MRN [id number]  PCP:  Jordan School, MD  Cardiologist:  Jordan Ross, M.D. Jordan Klein, MD   Chief Complaint  Patient presents with  . Follow-up    no complaints today     History of Present Illness:  Jordan Ross is a 71 y.o. male for defibrillator follow-up. He last saw Dr. Gwenlyn Ross for general cardiology follow-up in December 2016 and had post [facility] evaluation with Dr. Trula Ross in September 2017.   Jordan Ross feels well and denies any cardiac complaints. He has not had angina, dyspnea, edema, claudication, new focal neurological events, leg edema. He denies palpitations, dizziness or syncope.  Interrogation of his single-chamber St. Jude fortify ICD shows normal device function. The device was implanted in 2010 and has about 4 years of remaining longevity. It has not recorded VT or VF and has never delivered therapy or ventricular pacing. Lead parameters are excellent. Corvue shows a recent dependent thoracic impedance suggestive of fluid excess, but he has no edema or dyspnea to correlate with this. In the past thoracic impedance has not been a good marker for heart failure and deviations have always resolved spontaneously, without diuretic therapy.  Lipid profile was performed in February and showed elevated cholesterol and triglyceride levels. He thinks he was taking his usual lipid-lowering therapy at that time. Glycemic control has been less than adequate with an A1c of 8.4% in July, although this was an improvement from February when it was 8.7%. One year ago A1c was better at 7.2%.  We rechecked his lipids today and the parameters are much better. The total cholesterol is 126, VLDL is 58, triglycerides are normal at 136.  He presented with an anterior wall myocardial infarction in 2001 treated with coronary artery bypass grafting by Dr. Roxy Ross (LIMA to his LAD, SVGs to a ramus branch,  circumflex and PDA). His EF was in the 35% range. He underwent ICD implantation (single-chamber St. Jude Fortify) for primary prevention October 2010. He has not received either appropriate or inappropriate shocks. His other problems include a known abdominal aortic aneurysm s/p EVAR 05/2015, bilateral renal artery stenosis, hypertension, hyperlipidemia and non-insulin-requiring diabetes. He has chronic shortness of breath, likely related to a history of tobacco abuse, having smoked 40-pack-years, quit 2001 at the time of his open heart surgery. The stress Myoview performed 04/07/11 showed anterior scar without ischemia.  Past Medical History:  Diagnosis Date  . Abdominal aortic aneurysm ([place])   . AICD (automatic cardioverter/defibrillator) present    St Jude  . Anemia    iron deficiency anemia,takes iron pill daily  . Arthritis   . Coronary artery disease    AWMI 2001  . Diverticulosis   . GERD (gastroesophageal reflux disease)    takes Protonix daily  . History of colon polyps    benign  . History of migraine 40 yrs ago  . History of shingles   . Hyperlipidemia    takes Atorvastatin and Niacin daily  . Hypertension    takes Diovan,Spironolactone, and Carvedilol daily  . ICD (implantable cardioverter-defibrillator), single, in situ   . Ischemic cardiomyopathy   . Joint swelling   . LV dysfunction    takes Digoxin daily  . Myocardial infarction ([place]) 2001  . Pneumonia 40 yrs ago   hx of  . Shortness of breath dyspnea    with exertion.States he was a smoker for yr  . Status post  coronary artery bypass grafting   . Type 2 diabetes mellitus (HCC)    takes Glipizide,Metformin,Invokana,and Lantus daily.Average fasting blood sugar runs about 140    Past Surgical History:  Procedure Laterality Date  . ABDOMINAL AORTIC ENDOVASCULAR STENT GRAFT N/A 05/22/2015   Procedure: ABDOMINAL AORTIC ENDOVASCULAR STENT GRAFT;  Surgeon: Jordan Mitchell, MD;  Location: [facility];  Service: Vascular;   Laterality: N/A;  . CARDIAC CATHETERIZATION  2001  . COLONOSCOPY N/A 03/21/2014   Procedure: COLONOSCOPY;  Surgeon: Jordan Houston, MD;  [facility];  Service: Endoscopy;  Laterality: N/A;  855 - moved to 12/4 @ 8:30 - Ann notified pt  . CORONARY ARTERY BYPASS GRAFT  2001   unsure if it was 3 or 4  . DOPPLER ECHOCARDIOGRAPHY     2011 38% EF.2013 EF 30-35%    Current Medications: Outpatient Medications Prior to Visit  Medication Sig Dispense Refill  . aspirin 81 MG tablet Take 81 mg by mouth daily.     [name] atorvastatin (LIPITOR) 40 MG tablet Take 40 mg by mouth daily.    . carvedilol (COREG) 12.5 MG tablet Take 12.5 mg by mouth 2 (two) times daily.    . digoxin (LANOXIN) 0.125 MG tablet Take 125 mcg by mouth daily.    [name] FORA LANCETS MISC Test bid as directed. E11.65. 100 each 5  . glucose blood (FORA V10 BLOOD GLUCOSE TEST) test strip Use as instructed bid. E11.65 100 each 5  . glucose blood test strip Use as instructed 100 each 2  . Insulin Glargine (LANTUS SOLOSTAR) 100 UNIT/ML Solostar Pen Inject 50 Units into the skin at bedtime. 15 mL 2  . IRON PO Take 65 mg by mouth daily.    [name] LANCETS ULTRA THIN MISC Test BG every morning 100 each 5  . LITETOUCH PEN NEEDLES 31G X 8 MM MISC USE AT BEDTIME AS DIRECTED. 50 each 5  . metFORMIN (GLUCOPHAGE) 500 MG tablet Take 1,000 mg by mouth 2 (two) times daily with a meal.     . niacin (NIASPAN) 1000 MG CR tablet Take 1 tablet (1,000 mg total) by mouth daily. 30 tablet 3  . pantoprazole (PROTONIX) 40 MG tablet Take 40 mg by mouth daily.    [name] spironolactone (ALDACTONE) 25 MG tablet Take 25 mg by mouth daily.    . valsartan (DIOVAN) 160 MG tablet Take 160 mg by mouth daily.    . empagliflozin (JARDIANCE) 25 MG TABS tablet Take 25 mg by mouth daily. (Patient not taking: Reported on 12/23/2016) 30 tablet 3   No facility-administered medications prior to visit.      Allergies:   Patient has no known allergies.   Social History   Social  History  . Marital status: Widowed    Spouse name: N/A  . Number of children: N/A  . Years of education: N/A   Social History Main Topics  . Smoking status: Former Smoker    Years: 40.00  . Smokeless tobacco: Never Used     Comment: quit smoking in 2001  . Alcohol use No  . Drug use: No  . Sexual activity: Yes   Other Topics Concern  . None   Social History Narrative  . None     Family History:  The patient's family history includes COPD in his mother; Heart disease in his brother.   ROS:   Please see the history of present illness.    ROS All other systems reviewed and are negative.  PHYSICAL EXAM:   VS:  BP 140/78   Pulse (!) 58   Ht 6' (1.829 m)   Wt 200 lb (90.7 kg)   BMI 27.12 kg/m     General: Alert, oriented x3, no distress Head: no evidence of trauma, PERRL, EOMI, no exophtalmos or lid lag, no myxedema, no xanthelasma; normal ears, nose and oropharynx Neck: normal jugular venous pulsations and no hepatojugular reflux; brisk carotid pulses without delay and no carotid bruits Chest: clear to auscultation, no signs of consolidation by percussion or palpation, normal fremitus, symmetrical and full respiratory excursions. Healthy subclavian defibrillator site. Cardiovascular: normal position and quality of the apical impulse, regular rhythm, normal first and second heart sounds, no murmurs, rubs or gallops Abdomen: no tenderness or distention, no masses by palpation, no abnormal pulsatility or arterial bruits, normal bowel sounds, no hepatosplenomegaly Extremities: no clubbing, cyanosis or edema; 2+ radial, ulnar and brachial pulses bilaterally; 2+ right femoral, posterior tibial and dorsalis pedis pulses; 2+ left femoral, posterior tibial and dorsalis pedis pulses; no subclavian or femoral bruits Neurological: grossly nonfocal Psych: euthymic mood, full affect  Wt Readings from Last 3 Encounters:  12/23/16 200 lb (90.7 kg)  11/07/16 191 lb (86.6 kg)  06/21/16  192 lb (87.1 kg)      Studies/Labs Reviewed:   EKG:  EKG is ordered today.  The ekg ordered today Sinus bradycardia with a single PAC, left ventricular hypertrophy with QRS widening at 106 ms, "frozen anterior MI" pattern with left axis deviation, QTC 429 ms  Recent Labs: 10/25/2016: ALT 32; BUN 12; Creat 1.42; Potassium 5.3; Sodium 137   Lipid Panel    Component Value Date/Time   CHOL 126 12/23/2016 0943   TRIG 136 12/23/2016 0943   HDL 41 12/23/2016 0943   CHOLHDL 3.1 12/23/2016 0943   CHOLHDL 7.8 (H) 06/14/2016 1458   VLDL NOT CALC 06/14/2016 1458   LDLCALC 58 12/23/2016 0943     ASSESSMENT:    1. Chronic systolic heart failure ([place])   2. ICD (implantable cardioverter-defibrillator), single, in situ   3. Coronary artery disease involving native coronary artery of native heart without angina pectoris   4. Abdominal aortic aneurysm (AAA) without rupture ([place])   5. Essential hypertension   6. Dyslipidemia   7. Type 2 diabetes mellitus with vascular disease ([place])      PLAN:  In order of problems listed above:  1. CHF: Clinically euvolemic and asymptomatic, despite the deviations on thoracic impedance monitoring, NYHA functional class I. His weight at 200 pounds is actually almost 10 pounds higher than his usual weight, so he maybe carrying some extra fluid. He does not appear to require loop diuretics, but encouraged him to remain faithful to sodium restriction. He takes spironolactone and ARB and carvedilol.  2. CAD s/p CABG: No angina pectoris, most recent nuclear study without reversible ischemia. 3. ICD: Normal device function, remote downloads every 3 months and office visit yearly 4. AAA s/p EVAR: followed by Dr. Trula Ross 5. HTN: Fair control, beta blockers should be part of his medical regimen. Systolic blood pressure was 140 today, but was 127/80 in late July. 6. HLP: Good lipid profile on statin. Suboptimal values in February may have been related to deteriorating  diabetes control or maybe even some noncompliance with medications. 7. DM: Adequate control, most recent A1c 7% is an improvement.    Medication Adjustments/Labs and Tests Ordered: Current medicines are reviewed at length with the patient today.  Concerns regarding medicines are outlined above.  Medication changes, Labs and Tests ordered today are listed in the Patient Instructions below. Patient Instructions  Dr Sallyanne Kuster recommends that you continue on your current medications as directed. Please refer to the Current Medication list given to you today.  Your physician recommends that you return for lab work TODAY.  Remote monitoring is used to monitor your Pacemaker or ICD from home. This monitoring reduces the number of office visits required to check your device to one time per year. It allows [place] to keep an eye on the functioning of your device to ensure it is working properly. You are scheduled for a device check from home on Friday, December 7th, 2018. You may send your transmission at any time that day. If you have a wireless device, the transmission will be sent automatically. After your physician reviews your transmission, you will receive a notification with your next transmission date.  Your physician recommends that you schedule a follow-up appointment in 6 months with Dr Jordan Ross.  Dr Sallyanne Kuster recommends that you schedule a follow-up appointment in 12 months with a defibrillator check. You will receive a reminder letter in the mail two months in advance. If you don't receive a letter, please call our office to schedule the follow-up appointment.  If you need a refill on your cardiac medications before your next appointment, please call your pharmacy.    Signed, Jordan Klein, MD  12/25/2016 3:40 PM    [facility] [place], [place], [place]  [address] Phone: [phone]; Fax: [phone]

## 2017-01-16 ENCOUNTER — Ambulatory Visit (HOSPITAL_COMMUNITY)
Admission: RE | Admit: 2017-01-16 | Discharge: 2017-01-16 | Disposition: A | Discharge status: Home / Self Care | Payer: Medicare Other | Source: Ambulatory Visit | Attending: Surgery | Admitting: Surgery

## 2017-01-16 ENCOUNTER — Encounter: Payer: Self-pay | Admitting: Family

## 2017-01-16 ENCOUNTER — Ambulatory Visit (INDEPENDENT_AMBULATORY_CARE_PROVIDER_SITE_OTHER): Payer: Medicare Other | Admitting: Family

## 2017-01-16 VITALS — BP 141/88 | HR 61 | Temp 97.3°F | Resp 16 | Ht 72.0 in | Wt 204.0 lb

## 2017-01-16 DIAGNOSIS — Z95828 Presence of other vascular implants and grafts: Secondary | ICD-10-CM

## 2017-01-16 DIAGNOSIS — Z87891 Personal history of nicotine dependence: Secondary | ICD-10-CM | POA: Diagnosis not present

## 2017-01-16 DIAGNOSIS — I714 Abdominal aortic aneurysm, without rupture, unspecified: Secondary | ICD-10-CM

## 2017-01-16 DIAGNOSIS — I255 Ischemic cardiomyopathy: Secondary | ICD-10-CM

## 2017-01-16 NOTE — Patient Instructions (Addendum)
Before your next abdominal ultrasound:  Take two Extra-Strength Gas-X capsules at bedtime the night before the test. Take another two Extra-Strength Gas-X capsules 3 hours before the test.  Avoid gas forming foods the day before the test.    01-16-17

## 2017-01-16 NOTE — Progress Notes (Signed)
[facility] [place]  CC: Follow up s/p Endovascular Repair of Abdominal Aortic Aneurysm    History of Present Illness  Jordan Ross is a 71 y.o. (1946-03-18) male who returns today for follow-up.  On 05-23-2015, he underwent endovascular repair of a 5 cm infrarenal abdominal aortic aneurysm by Dr. Trula Slade.  His postoperative course was uncomplicated.  His initial CT scan showed no evidence of endoleak.    He fell off of a ladder in mid 2017, and suffered fractures to his left shoulder and left foot, was treated in the ED at that time; states his shoulder and foot have mild residual pain.  He denies claudication sx's with walking, denies any hx of stroke or TIA.   Dr. Trula Slade last evaluated pt on 01-11-16. At that time Carotid Duplex demonstrated  <40% bilateral stenosis. AAA: No evidence of endoleak.  The aneurysm had decreased in size with a maximum diameter of 4.4 cm. Carotid:  No further follow up needed as arteries were normal on duplex. AAA:  S/p EVAR without complication.  Aneurysm continues to decrease in size without evidence of endoleak.  Follow-up 1 year with repeat ultrasound.  Pt denies back or abdominal pain.   Pt Diabetic: Yes, last A1C result on file was 8.4 on 10-25-16; states he is working with his PCP to improve this  Pt smoker: former smoker, quit in 2001, started smoking at age 67 years    Past Medical History:  Diagnosis Date  . Abdominal aortic aneurysm ([place])   . AICD (automatic cardioverter/defibrillator) present    St Jude  . Anemia    iron deficiency anemia,takes iron pill daily  . Arthritis   . Coronary artery disease    AWMI 2001  . Diverticulosis   . GERD (gastroesophageal reflux disease)    takes Protonix daily  . History of colon polyps    benign  . History of migraine 40 yrs ago  . History of shingles   . Hyperlipidemia    takes Atorvastatin and Niacin daily  . Hypertension    takes Diovan,Spironolactone, and Carvedilol  daily  . ICD (implantable cardioverter-defibrillator), single, in situ   . Ischemic cardiomyopathy   . Joint swelling   . LV dysfunction    takes Digoxin daily  . Myocardial infarction ([place]) 2001  . Pneumonia 40 yrs ago   hx of  . Shortness of breath dyspnea    with exertion.States he was a smoker for yr  . Status post coronary artery bypass grafting   . Type 2 diabetes mellitus (HCC)    takes Glipizide,Metformin,Invokana,and Lantus daily.Average fasting blood sugar runs about 140   Past Surgical History:  Procedure Laterality Date  . ABDOMINAL AORTIC ENDOVASCULAR STENT GRAFT N/A 05/22/2015   Procedure: ABDOMINAL AORTIC ENDOVASCULAR STENT GRAFT;  Surgeon: Serafina Mitchell, MD;  Location: [facility];  Service: Vascular;  Laterality: N/A;  . CARDIAC CATHETERIZATION  2001  . COLONOSCOPY N/A 03/21/2014   Procedure: COLONOSCOPY;  Surgeon: Rogene Houston, MD;  [facility];  Service: Endoscopy;  Laterality: N/A;  855 - moved to 12/4 @ 8:30 - Ann notified pt  . CORONARY ARTERY BYPASS GRAFT  2001   unsure if it was 3 or 4  . DOPPLER ECHOCARDIOGRAPHY     2011 38% EF.2013 EF 30-35%   Social History Social History  Substance Use Topics  . Smoking status: Former Smoker    Years: 40.00  . Smokeless tobacco: Never Used  Comment: quit smoking in 2001  . Alcohol use No   Family History Family History  Problem Relation Age of Onset  . COPD Mother   . Heart disease Brother    Current Outpatient Prescriptions on File Prior to Visit  Medication Sig Dispense Refill  . aspirin 81 MG tablet Take 81 mg by mouth daily.     [name] atorvastatin (LIPITOR) 40 MG tablet Take 40 mg by mouth daily.    . carvedilol (COREG) 12.5 MG tablet Take 12.5 mg by mouth 2 (two) times daily.    . digoxin (LANOXIN) 0.125 MG tablet Take 125 mcg by mouth daily.    [name] FORA LANCETS MISC Test bid as directed. E11.65. 100 each 5  . glucose blood (FORA V10 BLOOD GLUCOSE TEST) test strip Use as instructed bid. E11.65 100  each 5  . glucose blood test strip Use as instructed 100 each 2  . Insulin Glargine (LANTUS SOLOSTAR) 100 UNIT/ML Solostar Pen Inject 50 Units into the skin at bedtime. 15 mL 2  . IRON PO Take 65 mg by mouth daily.    [name] LANCETS ULTRA THIN MISC Test BG every morning 100 each 5  . LITETOUCH PEN NEEDLES 31G X 8 MM MISC USE AT BEDTIME AS DIRECTED. 50 each 5  . metFORMIN (GLUCOPHAGE) 500 MG tablet Take 1,000 mg by mouth 2 (two) times daily with a meal.     . niacin (NIASPAN) 1000 MG CR tablet Take 1 tablet (1,000 mg total) by mouth daily. 30 tablet 3  . pantoprazole (PROTONIX) 40 MG tablet Take 40 mg by mouth daily.    [name] spironolactone (ALDACTONE) 25 MG tablet Take 25 mg by mouth daily.    . valsartan (DIOVAN) 160 MG tablet Take 160 mg by mouth daily.     No current facility-administered medications on file prior to visit.    No Known Allergies   ROS: See HPI for pertinent positives and negatives.  Physical Examination  Vitals:   01/16/17 0947  BP: (!) 141/88  Pulse: 61  Resp: 16  Temp: (!) 97.3 F (36.3 C)  TempSrc: Oral  SpO2: 97%  Weight: 204 lb (92.5 kg)  Height: 6' (1.829 m)   Body mass index is 27.67 kg/m.  General: A&O x 3, WD male  Pulmonary: Sym exp, respirations are non labored, good air movt, CTAB, no rales, rhonchi, or wheezing.  Cardiac: RRR, Nl S1, S2, no murmur appreciated  Vascular: Vessel Right Left  Radial Palpable Palpable  Carotid  without bruit  without bruit  Aorta Not palpable N/A  Femoral Palpable Palpable  Popliteal 1+ palpable Not palpable  PT Palpable Palpable  DP Palpable Not Palpable   Gastrointestinal: soft, NTND, -G/R, - HSM, - palpable masses, - CVAT B.  Musculoskeletal: M/S 5/5 throughout, extremities without ischemic changes.  Neurologic: Pain and light touch intact in extremities, Motor exam as listed above.   DATA  EVAR Duplex (Date: 01-16-17):  AAA sac size: 4.06 cm x 4.17 cm; Iliac arteries were difficult to visualize;  Right CIA: 1.27 CM; Left CIA: 1.2 cm  no endoleak detected 01-11-16: AAA sac size: 4.3 cm x 4.4 cm  Medical Decision Making  Jordan Ross is a 71 y.o. male who presents s/p EVAR (Date: 05-23-2015).  Pt is asymptomatic with slight decrease in sac size compared to a year ago, 4.17 cm today.   I discussed with the patient the importance of surveillance of the endograft.  The next endograft duplex will be scheduled  for 12 months.  The patient will follow up with [place] in 12 months with these studies.  I emphasized the importance of maximal medical management including strict control of blood pressure, blood glucose, and lipid levels, antiplatelet agents, obtaining regular exercise, and cessation of smoking.   Thank you for allowing [place] to participate in this patient's care.  Clemon Chambers, RN, MSN, FNP-C [facility] [place] Office: [facility] Physician: Trula Slade  01/16/2017, 10:03 AM

## 2017-01-18 NOTE — Addendum Note (Signed)
Addended by: Lianne Cure A on: 01/18/2017 10:50 AM   Modules accepted: Orders

## 2017-01-30 DIAGNOSIS — Z23 Encounter for immunization: Secondary | ICD-10-CM | POA: Diagnosis not present

## 2017-01-30 DIAGNOSIS — E1159 Type 2 diabetes mellitus with other circulatory complications: Secondary | ICD-10-CM | POA: Diagnosis not present

## 2017-01-31 LAB — RENAL FUNCTION PANEL
ALBUMIN MSPROF: 4.4 g/dL (ref 3.6–5.1)
BUN/Creatinine Ratio: 10 (calc) (ref 6–22)
BUN: 13 mg/dL (ref 7–25)
CALCIUM: 9.3 mg/dL (ref 8.6–10.3)
CO2: 30 mmol/L (ref 20–32)
CREATININE: 1.27 mg/dL — AB (ref 0.70–1.25)
Chloride: 104 mmol/L (ref 98–110)
GLUCOSE: 174 mg/dL — AB (ref 65–99)
POTASSIUM: 4.5 mmol/L (ref 3.5–5.3)
Phosphorus: 3.3 mg/dL (ref 2.1–4.3)
SODIUM: 142 mmol/L (ref 135–146)

## 2017-01-31 LAB — HEMOGLOBIN A1C
EAG (MMOL/L): 10.4 (calc)
HEMOGLOBIN A1C: 8.2 %{Hb} — AB (ref <5.7)
MEAN PLASMA GLUCOSE: 189 (calc)

## 2017-01-31 LAB — MICROALBUMIN / CREATININE URINE RATIO
CREATININE, URINE: 211 mg/dL (ref 20–320)
MICROALB UR: 2 mg/dL
MICROALB/CREAT RATIO: 9 ug/mg{creat} (ref <30)

## 2017-01-31 LAB — T4, FREE: Free T4: 1.1 ng/dL (ref 0.8–1.8)

## 2017-01-31 LAB — TSH: TSH: 2.12 m[IU]/L (ref 0.40–4.50)

## 2017-02-08 ENCOUNTER — Encounter: Payer: Self-pay | Admitting: "Endocrinology

## 2017-02-08 ENCOUNTER — Ambulatory Visit (INDEPENDENT_AMBULATORY_CARE_PROVIDER_SITE_OTHER): Payer: Medicare Other | Admitting: "Endocrinology

## 2017-02-08 VITALS — BP 130/81 | HR 68 | Ht 72.0 in | Wt 196.0 lb

## 2017-02-08 DIAGNOSIS — I255 Ischemic cardiomyopathy: Secondary | ICD-10-CM | POA: Diagnosis not present

## 2017-02-08 DIAGNOSIS — E1159 Type 2 diabetes mellitus with other circulatory complications: Secondary | ICD-10-CM

## 2017-02-08 DIAGNOSIS — I10 Essential (primary) hypertension: Secondary | ICD-10-CM | POA: Diagnosis not present

## 2017-02-08 DIAGNOSIS — E782 Mixed hyperlipidemia: Secondary | ICD-10-CM | POA: Diagnosis not present

## 2017-02-08 MED ORDER — INSULIN GLARGINE 100 UNIT/ML SOLOSTAR PEN: 60.0000 [IU] | PEN_INJECTOR | Freq: Every day | SUBCUTANEOUS | 2 refills | Status: DC

## 2017-02-08 NOTE — Progress Notes (Signed)
Subjective:    Patient ID: Jordan Ross, male    DOB: Feb 25, 1946, PCP Redmond School, MD   Past Medical History:  Diagnosis Date  . Abdominal aortic aneurysm ([place])   . AICD (automatic cardioverter/defibrillator) present    [employer]  . Anemia    iron deficiency anemia,takes iron pill daily  . Arthritis   . Coronary artery disease    AWMI 2001  . Diverticulosis   . GERD (gastroesophageal reflux disease)    takes Protonix daily  . History of colon polyps    benign  . History of migraine 40 yrs ago  . History of shingles   . Hyperlipidemia    takes Atorvastatin and Niacin daily  . Hypertension    takes Diovan,Spironolactone, and Carvedilol daily  . ICD (implantable cardioverter-defibrillator), single, in situ   . Ischemic cardiomyopathy   . Joint swelling   . LV dysfunction    takes Digoxin daily  . Myocardial infarction ([place]) 2001  . Pneumonia 40 yrs ago   hx of  . Shortness of breath dyspnea    with exertion.States he was a smoker for yr  . Status post coronary artery bypass grafting   . Type 2 diabetes mellitus (HCC)    takes Glipizide,Metformin,Invokana,and Lantus daily.Average fasting blood sugar runs about 140   Past Surgical History:  Procedure Laterality Date  . ABDOMINAL AORTIC ENDOVASCULAR STENT GRAFT N/A 05/22/2015   Procedure: ABDOMINAL AORTIC ENDOVASCULAR STENT GRAFT;  Surgeon: Serafina Mitchell, MD;  Location: [facility];  Service: Vascular;  Laterality: N/A;  . CARDIAC CATHETERIZATION  2001  . COLONOSCOPY N/A 03/21/2014   Procedure: COLONOSCOPY;  Surgeon: Rogene Houston, MD;  [facility];  Service: Endoscopy;  Laterality: N/A;  855 - moved to 12/4 @ 8:30 - Ann notified pt  . CORONARY ARTERY BYPASS GRAFT  2001   unsure if it was 3 or 4  . DOPPLER ECHOCARDIOGRAPHY     2011 38% EF.2013 EF 30-35%   Social History   Social History  . Marital status: Widowed    Spouse name: N/A  . Number of children: N/A  . Years of education: N/A   Social  History Main Topics  . Smoking status: Former Smoker    Years: 40.00  . Smokeless tobacco: Never Used     Comment: quit smoking in 2001  . Alcohol use No  . Drug use: No  . Sexual activity: Yes   Other Topics Concern  . None   Social History Narrative  . None   Outpatient Encounter Prescriptions as of 02/08/2017  Medication Sig  . aspirin 81 MG tablet Take 81 mg by mouth daily.   [name] atorvastatin (LIPITOR) 40 MG tablet Take 40 mg by mouth daily.  . carvedilol (COREG) 12.5 MG tablet Take 12.5 mg by mouth 2 (two) times daily.  . digoxin (LANOXIN) 0.125 MG tablet Take 125 mcg by mouth daily.  [name] FORA LANCETS MISC Test bid as directed. E11.65.  [name] glucose blood (FORA V10 BLOOD GLUCOSE TEST) test strip Use as instructed bid. E11.65  . glucose blood test strip Use as instructed  . Insulin Glargine (LANTUS SOLOSTAR) 100 UNIT/ML Solostar Pen Inject 60 Units into the skin daily at 10 pm.  . IRON PO Take 65 mg by mouth daily.  [name] LANCETS ULTRA THIN MISC Test BG every morning  . LITETOUCH PEN NEEDLES 31G X 8 MM MISC USE AT BEDTIME AS DIRECTED.  [name] metFORMIN (GLUCOPHAGE) 500 MG tablet  Take 1,000 mg by mouth 2 (two) times daily with a meal.   . niacin (NIASPAN) 1000 MG CR tablet Take 1 tablet (1,000 mg total) by mouth daily.  . pantoprazole (PROTONIX) 40 MG tablet Take 40 mg by mouth daily.  [name] spironolactone (ALDACTONE) 25 MG tablet Take 25 mg by mouth daily.  . valsartan (DIOVAN) 160 MG tablet Take 160 mg by mouth daily.  . [DISCONTINUED] Insulin Glargine (LANTUS SOLOSTAR) 100 UNIT/ML Solostar Pen Inject 50 Units into the skin at bedtime.   No facility-administered encounter medications on file as of 02/08/2017.    ALLERGIES: No Known Allergies VACCINATION STATUS:  There is no immunization history on file for this patient.  Diabetes  He presents for his follow-up diabetic visit. He has type 2 diabetes mellitus. Onset time: He was diagnosed at approximate age of 5 years. His disease course  has been stable. There are no hypoglycemic associated symptoms. Pertinent negatives for hypoglycemia include no confusion, headaches, pallor or seizures. There are no diabetic associated symptoms. Pertinent negatives for diabetes include no chest pain, no fatigue, no polydipsia, no polyphagia, no polyuria and no weakness. There are no hypoglycemic complications. Symptoms are stable. Diabetic complications include heart disease. Risk factors for coronary artery disease include diabetes mellitus, dyslipidemia, hypertension, male sex, sedentary lifestyle and tobacco exposure. Current diabetic treatment includes insulin injections and oral agent (dual therapy). He is compliant with treatment most of the time. His weight is increasing steadily. He is following a generally unhealthy diet. He has had a previous visit with a dietitian. His home blood glucose trend is decreasing steadily. His breakfast blood glucose range is generally 140-180 mg/dl. An ACE inhibitor/angiotensin II receptor blocker is being taken. Eye exam is not current.  Hyperlipidemia  This is a chronic problem. The current episode started more than 1 year ago. Pertinent negatives include no chest pain, myalgias or shortness of breath. Current antihyperlipidemic treatment includes statins.  Hypertension  This is a chronic problem. The current episode started more than 1 year ago. Pertinent negatives include no chest pain, headaches, neck pain, palpitations or shortness of breath. Risk factors for coronary artery disease include dyslipidemia. Past treatments include ACE inhibitors.     Review of Systems  Constitutional: Negative for chills, fatigue, fever and unexpected weight change.  HENT: Negative for dental problem, mouth sores and trouble swallowing.   Eyes: Negative for visual disturbance.  Respiratory: Negative for cough, choking, chest tightness, shortness of breath and wheezing.   Cardiovascular: Negative for chest pain, palpitations  and leg swelling.  Gastrointestinal: Negative for abdominal distention, abdominal pain, constipation, diarrhea, nausea and vomiting.  Endocrine: Negative for polydipsia, polyphagia and polyuria.  Genitourinary: Negative for dysuria, flank pain, hematuria and urgency.  Musculoskeletal: Negative for back pain, gait problem, myalgias and neck pain.  Skin: Negative for pallor, rash and wound.  Neurological: Negative for seizures, syncope, weakness, numbness and headaches.  Psychiatric/Behavioral: Negative.  Negative for confusion and dysphoric mood.    Objective:    BP 130/81   Pulse 68   Ht 6' (1.829 m)   Wt 196 lb (88.9 kg)   BMI 26.58 kg/m   Wt Readings from Last 3 Encounters:  02/08/17 196 lb (88.9 kg)  01/16/17 204 lb (92.5 kg)  12/23/16 200 lb (90.7 kg)    Physical Exam  Constitutional: He is oriented to person, place, and time. He appears well-developed and well-nourished. He is cooperative. No distress.  HENT:  Head: Normocephalic and atraumatic.  Eyes: EOM are  normal.  Neck: Normal range of motion. Neck supple. No tracheal deviation present. No thyromegaly present.  Cardiovascular: Normal rate, S1 normal, S2 normal and normal heart sounds.  Exam reveals no gallop.   No murmur heard. Pulses:      Dorsalis pedis pulses are 1+ on the right side, and 1+ on the left side.       Posterior tibial pulses are 1+ on the right side, and 1+ on the left side.  Pulmonary/Chest: Breath sounds normal. No respiratory distress. He has no wheezes.  Abdominal: Soft. Bowel sounds are normal. He exhibits no distension. There is no tenderness. There is no guarding and no CVA tenderness.  Musculoskeletal: He exhibits no edema.       Right shoulder: He exhibits no swelling and no deformity.  Neurological: He is alert and oriented to person, place, and time. He has normal strength and normal reflexes. No cranial nerve deficit or sensory deficit. Gait normal.  Skin: Skin is warm and dry. No rash  noted. No cyanosis. Nails show no clubbing.  Psychiatric: He has a normal mood and affect. His speech is normal and behavior is normal. Judgment and thought content normal. Cognition and memory are normal.    Results for orders placed or performed in visit on 12/23/16  Lipid panel  Result Value Ref Range   Cholesterol, Total 126 100 - 199 mg/dL   Triglycerides 136 0 - 149 mg/dL   HDL 41 >39 mg/dL   VLDL Cholesterol Cal 27 5 - 40 mg/dL   LDL Calculated 58 0 - 99 mg/dL   Chol/HDL Ratio 3.1 0.0 - 5.0 ratio   Complete Blood Count (Most recent): Lab Results  Component Value Date   WBC 9.3 05/23/2015   HGB 12.7 (L) 05/23/2015   HCT 37.6 (L) 05/23/2015   MCV 87.2 05/23/2015   PLT 181 05/23/2015   Diabetic Labs (most recent): Lab Results  Component Value Date   HGBA1C 8.2 (H) 01/30/2017   HGBA1C 8.4 (H) 10/25/2016   HGBA1C 8.7 (H) 06/14/2016     Assessment & Plan:   1. Type 2 diabetes mellitus with vascular disease (HCC)  - His diabetes is  complicated by coronary artery disease and patient remains at a high risk for more acute and chronic complications of diabetes which include CAD, CVA, CKD, retinopathy, and neuropathy. These are all discussed in detail with the patient. Patient came with above target fasting glucose profile, his A1c is   improving to 8.2%  from 8.7% .  -  Glucose logs and insulin administration records pertaining to this visit,  to be scanned into patient's records.  Recent labs reviewed.   - I have re-counseled the patient on diet management and weight loss  by adopting a carbohydrate restricted / protein rich  Diet.  -  Suggestion is made for him to avoid simple carbohydrates  from his diet including Cakes, Sweet Desserts / Pastries, Ice Cream, Soda (diet and regular), Sweet Tea, Candies, Chips, Cookies, Store Bought Juices, Alcohol in Excess of  1-2 drinks a day, Artificial Sweeteners, and "Sugar-free" Products. This will help patient to have stable blood  glucose profile and potentially avoid unintended weight gain.  - Patient is advised to stick to a routine mealtimes to eat 3 meals  a day and avoid unnecessary snacks ( to snack only to correct hypoglycemia).   - I have approached patient with the following individualized plan to manage diabetes and patient agrees.   -He will continue metformin  1000 g by mouth twice a day with meals.  - I will increase Lantus to 60 units daily at bedtime associated with monitoring of blood glucose daily before breakfast.  - Patient specific target  for A1c; LDL, HDL, Triglycerides, and  Waist Circumference were discussed in detail.  2) BP/HTN:   Controlled and I advised him to continue current medications including ACEI/ARB. 3) Lipids/HPL:  controlled with  triglycerides improving to 136 from 434, LDL improving to 58.  I advised him to continue atorvastatin 40 mg by mouth daily at bedtime and  Niaspan 1000 mg by mouth daily at bedtime.  4)  Weight/Diet: He has gained 5 more pounds since last visit, CDE consult in progress, exercise, and carbohydrates information provided.  5) Chronic Care/Health Maintenance:  -Patient  is on ACEI/ARB and Statin medications and encouraged to continue to follow up with Ophthalmology, Podiatrist at least yearly or according to recommendations, and advised to  stay away from smoking. I have recommended yearly flu vaccine and pneumonia vaccination at least every 5 years; moderate intensity exercise for up to 150 minutes weekly; and  sleep for at least 7 hours a day.  - Time spent with the patient: 25 min, of which >50% was spent in reviewing his sugar logs , discussing his hypo- and hyper-glycemic episodes, reviewing his current and  previous labs and insulin doses and developing a plan to avoid hypo- and hyper-glycemia.    - I advised patient to maintain close follow up with Redmond School, MD for primary care needs.   Follow up plan: -Return in about 3 months (around  05/11/2017) for meter, and logs.  Glade Lloyd, MD Phone: [phone]  Fax: [phone]  -  This note was partially dictated with voice recognition software. Similar sounding words can be transcribed inadequately or may not  be corrected upon review.  02/08/2017, 3:24 PM

## 2017-02-08 NOTE — Patient Instructions (Signed)

## 2017-03-07 DIAGNOSIS — K219 Gastro-esophageal reflux disease without esophagitis: Secondary | ICD-10-CM | POA: Diagnosis not present

## 2017-03-07 DIAGNOSIS — E1129 Type 2 diabetes mellitus with other diabetic kidney complication: Secondary | ICD-10-CM | POA: Diagnosis not present

## 2017-03-07 DIAGNOSIS — Z6827 Body mass index (BMI) 27.0-27.9, adult: Secondary | ICD-10-CM | POA: Diagnosis not present

## 2017-03-07 DIAGNOSIS — I2510 Atherosclerotic heart disease of native coronary artery without angina pectoris: Secondary | ICD-10-CM | POA: Diagnosis not present

## 2017-03-07 DIAGNOSIS — E782 Mixed hyperlipidemia: Secondary | ICD-10-CM | POA: Diagnosis not present

## 2017-03-07 DIAGNOSIS — J329 Chronic sinusitis, unspecified: Secondary | ICD-10-CM | POA: Diagnosis not present

## 2017-03-07 DIAGNOSIS — N182 Chronic kidney disease, stage 2 (mild): Secondary | ICD-10-CM | POA: Diagnosis not present

## 2017-03-07 DIAGNOSIS — E538 Deficiency of other specified B group vitamins: Secondary | ICD-10-CM | POA: Diagnosis not present

## 2017-03-07 DIAGNOSIS — E1122 Type 2 diabetes mellitus with diabetic chronic kidney disease: Secondary | ICD-10-CM | POA: Diagnosis not present

## 2017-03-07 DIAGNOSIS — Z0001 Encounter for general adult medical examination with abnormal findings: Secondary | ICD-10-CM | POA: Diagnosis not present

## 2017-03-07 DIAGNOSIS — I10 Essential (primary) hypertension: Secondary | ICD-10-CM | POA: Diagnosis not present

## 2017-03-07 DIAGNOSIS — G894 Chronic pain syndrome: Secondary | ICD-10-CM | POA: Diagnosis not present

## 2017-03-07 DIAGNOSIS — J309 Allergic rhinitis, unspecified: Secondary | ICD-10-CM | POA: Diagnosis not present

## 2017-03-07 DIAGNOSIS — Z125 Encounter for screening for malignant neoplasm of prostate: Secondary | ICD-10-CM | POA: Diagnosis not present

## 2017-03-07 DIAGNOSIS — Z1389 Encounter for screening for other disorder: Secondary | ICD-10-CM | POA: Diagnosis not present

## 2017-03-07 DIAGNOSIS — E1165 Type 2 diabetes mellitus with hyperglycemia: Secondary | ICD-10-CM | POA: Diagnosis not present

## 2017-03-24 ENCOUNTER — Telehealth: Payer: Self-pay | Admitting: Cardiology

## 2017-03-24 ENCOUNTER — Ambulatory Visit (INDEPENDENT_AMBULATORY_CARE_PROVIDER_SITE_OTHER): Payer: Medicare Other | Admitting: *Deleted

## 2017-03-24 DIAGNOSIS — I255 Ischemic cardiomyopathy: Secondary | ICD-10-CM | POA: Diagnosis not present

## 2017-03-24 NOTE — Telephone Encounter (Signed)
LMOVM reminding pt to send remote transmission.   

## 2017-03-27 NOTE — Progress Notes (Signed)
Remote ICD transmission.   

## 2017-03-29 DIAGNOSIS — H33102 Unspecified retinoschisis, left eye: Secondary | ICD-10-CM | POA: Diagnosis not present

## 2017-03-31 ENCOUNTER — Encounter: Payer: Self-pay | Admitting: Cardiology

## 2017-03-31 LAB — CUP PACEART REMOTE DEVICE CHECK
Battery Remaining Longevity: 46 mo
Battery Remaining Percentage: 38 %
Battery Voltage: 2.9 V
Brady Statistic RV Percent Paced: 1 %
Date Time Interrogation Session: 20181207182424
HighPow Impedance: 47 Ohm
Implantable Lead Implant Date: 20101028
Implantable Lead Location: 753860
Implantable Pulse Generator Implant Date: 20101028
Lead Channel Impedance Value: 440 Ohm
Lead Channel Pacing Threshold Amplitude: 0.75 V
Lead Channel Pacing Threshold Pulse Width: 0.4 ms
Lead Channel Sensing Intrinsic Amplitude: 12 mV
Lead Channel Setting Pacing Amplitude: 2.5 V
Lead Channel Setting Pacing Pulse Width: 0.4 ms
Lead Channel Setting Sensing Sensitivity: 0.5 mV
Pulse Gen Serial Number: 737526

## 2017-05-11 ENCOUNTER — Ambulatory Visit: Payer: Medicare Other | Admitting: "Endocrinology

## 2017-05-19 DIAGNOSIS — E1159 Type 2 diabetes mellitus with other circulatory complications: Secondary | ICD-10-CM | POA: Diagnosis not present

## 2017-05-20 LAB — COMPLETE METABOLIC PANEL WITH GFR
AG RATIO: 1.8 (calc) (ref 1.0–2.5)
ALT: 14 U/L (ref 9–46)
AST: 16 U/L (ref 10–35)
Albumin: 4.3 g/dL (ref 3.6–5.1)
Alkaline phosphatase (APISO): 44 U/L (ref 40–115)
BUN: 10 mg/dL (ref 7–25)
CALCIUM: 9.2 mg/dL (ref 8.6–10.3)
CHLORIDE: 102 mmol/L (ref 98–110)
CO2: 30 mmol/L (ref 20–32)
Creat: 1.07 mg/dL (ref 0.70–1.25)
GFR, EST AFRICAN AMERICAN: 82 mL/min/{1.73_m2} (ref >=60)
GFR, EST NON AFRICAN AMERICAN: 70 mL/min/{1.73_m2} (ref >=60)
GLOBULIN: 2.4 g/dL (ref 1.9–3.7)
Glucose, Bld: 237 mg/dL — ABNORMAL HIGH (ref 65–99)
POTASSIUM: 4.9 mmol/L (ref 3.5–5.3)
SODIUM: 139 mmol/L (ref 135–146)
Total Bilirubin: 0.6 mg/dL (ref 0.2–1.2)
Total Protein: 6.7 g/dL (ref 6.1–8.1)

## 2017-05-20 LAB — HEMOGLOBIN A1C
EAG (MMOL/L): 11.1 (calc)
Hgb A1c MFr Bld: 8.6 % of total Hgb — ABNORMAL HIGH (ref <5.7)
MEAN PLASMA GLUCOSE: 200 (calc)

## 2017-05-25 ENCOUNTER — Ambulatory Visit (INDEPENDENT_AMBULATORY_CARE_PROVIDER_SITE_OTHER): Payer: Medicare Other | Admitting: "Endocrinology

## 2017-05-25 ENCOUNTER — Encounter: Payer: Self-pay | Admitting: "Endocrinology

## 2017-05-25 VITALS — BP 125/88 | HR 76 | Ht 72.0 in | Wt 200.0 lb

## 2017-05-25 DIAGNOSIS — E1159 Type 2 diabetes mellitus with other circulatory complications: Secondary | ICD-10-CM

## 2017-05-25 DIAGNOSIS — I10 Essential (primary) hypertension: Secondary | ICD-10-CM

## 2017-05-25 DIAGNOSIS — E782 Mixed hyperlipidemia: Secondary | ICD-10-CM | POA: Diagnosis not present

## 2017-05-25 MED ORDER — INSULIN GLARGINE 300 UNIT/ML ~~LOC~~ SOPN: 70.0000 [IU] | PEN_INJECTOR | Freq: Every day | SUBCUTANEOUS | 2 refills | Status: DC

## 2017-05-25 MED ORDER — GLUCOSE BLOOD VI STRP: ORAL_STRIP | 5 refills | Status: AC

## 2017-05-25 NOTE — Patient Instructions (Signed)

## 2017-05-25 NOTE — Progress Notes (Signed)
Subjective:    Patient ID: Jordan Ross, male    DOB: 11-Dec-1945, PCP Redmond School, MD   Past Medical History:  Diagnosis Date   Abdominal aortic aneurysm ([place])    AICD (automatic cardioverter/defibrillator) present    St Jude   Anemia    iron deficiency anemia,takes iron pill daily   Arthritis    Coronary artery disease    AWMI 2001   Diverticulosis    GERD (gastroesophageal reflux disease)    takes Protonix daily   History of colon polyps    benign   History of migraine 40 yrs ago   History of shingles    Hyperlipidemia    takes Atorvastatin and Niacin daily   Hypertension    takes Diovan,Spironolactone, and Carvedilol daily   ICD (implantable cardioverter-defibrillator), single, in situ    Ischemic cardiomyopathy    Joint swelling    LV dysfunction    takes Digoxin daily   Myocardial infarction ([place]) 2001   Pneumonia 40 yrs ago   hx of   Shortness of breath dyspnea    with exertion.States he was a smoker for yr   Status post coronary artery bypass grafting    Type 2 diabetes mellitus ([place])    takes Glipizide,Metformin,Invokana,and Lantus daily.Average fasting blood sugar runs about 140   Past Surgical History:  Procedure Laterality Date   ABDOMINAL AORTIC ENDOVASCULAR STENT GRAFT N/A 05/22/2015   Procedure: ABDOMINAL AORTIC ENDOVASCULAR STENT GRAFT;  Surgeon: Serafina Mitchell, MD;  Location: [facility];  Service: Vascular;  Laterality: N/A;   CARDIAC CATHETERIZATION  2001   COLONOSCOPY N/A 03/21/2014   Procedure: COLONOSCOPY;  Surgeon: Rogene Houston, MD;  [facility];  Service: Endoscopy;  Laterality: N/A;  855 - moved to 12/4 @ 8:30 - Ann notified pt   CORONARY ARTERY BYPASS GRAFT  2001   unsure if it was 3 or 4   DOPPLER ECHOCARDIOGRAPHY     2011 38% EF.2013 EF 30-35%   Social History   Socioeconomic History   Marital status: Widowed    Spouse name: None   Number of children: None   Years of education: None    Highest education level: None  Social Designer, fashion/clothing strain: None   Food insecurity - worry: None   Food insecurity - inability: None   Transportation needs - medical: None   Transportation needs - non-medical: None  Occupational History   None  Tobacco Use   Smoking status: Former Smoker    Years: 40.00   Smokeless tobacco: Never Used   Tobacco comment: quit smoking in 2001  Substance and Sexual Activity   Alcohol use: No   Drug use: No   Sexual activity: Yes  Other Topics Concern   None  Social History Narrative   None   Outpatient Encounter Medications as of 05/25/2017  Medication Sig   aspirin 81 MG tablet Take 81 mg by mouth daily.    atorvastatin (LIPITOR) 40 MG tablet Take 40 mg by mouth daily.   carvedilol (COREG) 12.5 MG tablet Take 12.5 mg by mouth 2 (two) times daily.   digoxin (LANOXIN) 0.125 MG tablet Take 125 mcg by mouth daily.   FORA LANCETS MISC Test bid as directed. E11.65.   glucose blood (FORA V10 BLOOD GLUCOSE TEST) test strip Use as instructed bid. E11.65   glucose blood test strip Use as instructed   Insulin Glargine (TOUJEO SOLOSTAR) 300 UNIT/ML SOPN Inject 70 Units into the  skin at bedtime.   IRON PO Take 65 mg by mouth daily.   LANCETS ULTRA THIN MISC Test BG every morning   LITETOUCH PEN NEEDLES 31G X 8 MM MISC USE AT BEDTIME AS DIRECTED.   metFORMIN (GLUCOPHAGE) 500 MG tablet Take 1,000 mg by mouth 2 (two) times daily with a meal.    niacin (NIASPAN) 1000 MG CR tablet Take 1 tablet (1,000 mg total) by mouth daily.   pantoprazole (PROTONIX) 40 MG tablet Take 40 mg by mouth daily.   spironolactone (ALDACTONE) 25 MG tablet Take 25 mg by mouth daily.   valsartan (DIOVAN) 160 MG tablet Take 160 mg by mouth daily.   [DISCONTINUED] glucose blood (FORA V10 BLOOD GLUCOSE TEST) test strip Use as instructed bid. E11.65   [DISCONTINUED] Insulin Glargine (LANTUS SOLOSTAR) 100 UNIT/ML Solostar Pen Inject 60 Units  into the skin daily at 10 pm.   No facility-administered encounter medications on file as of 05/25/2017.    ALLERGIES: No Known Allergies VACCINATION STATUS:  There is no immunization history on file for this patient.  Diabetes  He presents for his follow-up diabetic visit. He has type 2 diabetes mellitus. Onset time: He was diagnosed at approximate age of 28 years. His disease course has been worsening. There are no hypoglycemic associated symptoms. Pertinent negatives for hypoglycemia include no confusion, headaches, pallor or seizures. Associated symptoms include polydipsia and polyuria. Pertinent negatives for diabetes include no chest pain, no fatigue, no polyphagia and no weakness. There are no hypoglycemic complications. Symptoms are worsening. Diabetic complications include heart disease. Risk factors for coronary artery disease include diabetes mellitus, dyslipidemia, hypertension, male sex, sedentary lifestyle and tobacco exposure. Current diabetic treatment includes insulin injections and oral agent (dual therapy). He is compliant with treatment most of the time. His weight is increasing steadily. He is following a generally unhealthy diet. He has had a previous visit with a dietitian. His home blood glucose trend is decreasing steadily. His breakfast blood glucose range is generally 180-200 mg/dl. His overall blood glucose range is 180-200 mg/dl. An ACE inhibitor/angiotensin II receptor blocker is being taken. Eye exam is not current.  Hyperlipidemia  This is a chronic problem. The current episode started more than 1 year ago. Exacerbating diseases include diabetes. Pertinent negatives include no chest pain, myalgias or shortness of breath. Current antihyperlipidemic treatment includes statins. Risk factors for coronary artery disease include diabetes mellitus, dyslipidemia, hypertension, male sex and a sedentary lifestyle.  Hypertension  This is a chronic problem. The current episode  started more than 1 year ago. The problem is controlled. Pertinent negatives include no chest pain, headaches, neck pain, palpitations or shortness of breath. Risk factors for coronary artery disease include dyslipidemia, diabetes mellitus, male gender, sedentary lifestyle and smoking/tobacco exposure. Past treatments include ACE inhibitors.     Review of Systems  Constitutional: Negative for chills, fatigue, fever and unexpected weight change.  HENT: Negative for dental problem, mouth sores and trouble swallowing.   Eyes: Negative for visual disturbance.  Respiratory: Negative for cough, choking, chest tightness, shortness of breath and wheezing.   Cardiovascular: Negative for chest pain, palpitations and leg swelling.  Gastrointestinal: Negative for abdominal distention, abdominal pain, constipation, diarrhea, nausea and vomiting.  Endocrine: Positive for polydipsia and polyuria. Negative for polyphagia.  Genitourinary: Negative for dysuria, flank pain, hematuria and urgency.  Musculoskeletal: Negative for back pain, gait problem, myalgias and neck pain.  Skin: Negative for pallor, rash and wound.  Neurological: Negative for seizures, syncope, weakness, numbness and  headaches.  Psychiatric/Behavioral: Negative.  Negative for confusion and dysphoric mood.    Objective:    BP 125/88    Pulse 76    Ht 6' (1.829 m)    Wt 200 lb (90.7 kg)    BMI 27.12 kg/m   Wt Readings from Last 3 Encounters:  05/25/17 200 lb (90.7 kg)  02/08/17 196 lb (88.9 kg)  01/16/17 204 lb (92.5 kg)    Physical Exam  Constitutional: He is oriented to person, place, and time. He appears well-developed and well-nourished. He is cooperative. No distress.  HENT:  Head: Normocephalic and atraumatic.  Eyes: EOM are normal.  Neck: Normal range of motion. Neck supple. No tracheal deviation present. No thyromegaly present.  Cardiovascular: Normal rate, S1 normal, S2 normal and normal heart sounds. Exam reveals no gallop.   No murmur heard. Pulses:      Dorsalis pedis pulses are 1+ on the right side, and 1+ on the left side.       Posterior tibial pulses are 1+ on the right side, and 1+ on the left side.  Pulmonary/Chest: Breath sounds normal. No respiratory distress. He has no wheezes.  Abdominal: Soft. Bowel sounds are normal. He exhibits no distension. There is no tenderness. There is no guarding and no CVA tenderness.  Musculoskeletal: He exhibits no edema.       Right shoulder: He exhibits no swelling and no deformity.  Neurological: He is alert and oriented to person, place, and time. He has normal strength and normal reflexes. No cranial nerve deficit or sensory deficit. Gait normal.  Skin: Skin is warm and dry. No rash noted. No cyanosis. Nails show no clubbing.  Psychiatric: He has a normal mood and affect. His speech is normal and behavior is normal. Judgment and thought content normal. Cognition and memory are normal.    Results for orders placed or performed in visit on 03/24/17  CUP PACEART REMOTE DEVICE CHECK  Result Value Ref Range   Date Time Interrogation Session [id number]    Pulse Generator Manufacturer SJ[employer]    Pulse Gen Model 1231-40Q Fortify VR    Pulse Gen Serial Number [facility]    Implantable Pulse Generator Type Implantable Cardiac Defibulator    Implantable Pulse Generator Implant Date 49675916    Implantable Lead Manufacturer [facility]    Implantable Lead Model [id number] Durata SJ4    Implantable Lead Serial Number [id number]    Implantable Lead Implant Date 59935701    Implantable Lead Location Detail 1 APEX    Implantable Lead Location [id number]    Lead Channel Setting Sensing Sensitivity 0.5 mV   Lead Channel Setting Sensing Adaptation Mode Adaptive Sensing    Lead Channel Setting Pacing Pulse Width 0.4 ms   Lead Channel Setting Pacing Amplitude 2.5 V   Lead Channel Status     Lead Channel Impedance Value 440 ohm   Lead Channel Sensing Intrinsic  Amplitude 12.0 mV   Lead Channel Pacing Threshold Amplitude 0.75 V   Lead Channel Pacing Threshold Pulse Width 0.4 ms   HighPow Impedance 47 ohm   HighPow Imped Status     Battery Status MOS    Battery Remaining Longevity 46 mo   Battery Remaining Percentage 38.0 %   Battery Voltage 2.9 V   Brady Statistic RV Percent Paced 1.0 %   Eval Rhythm Vs    Complete Blood Count (Most recent): Lab Results  Component Value Date   WBC 9.3 05/23/2015  HGB 12.7 (L) 05/23/2015   HCT 37.6 (L) 05/23/2015   MCV 87.2 05/23/2015   PLT 181 05/23/2015   Diabetic Labs (most recent): Lab Results  Component Value Date   HGBA1C 8.6 (H) 05/19/2017   HGBA1C 8.2 (H) 01/30/2017   HGBA1C 8.4 (H) 10/25/2016     Assessment & Plan:   1. Type 2 diabetes mellitus with vascular disease (HCC)  - His diabetes is  complicated by coronary artery disease and patient remains at a high risk for more acute and chronic complications of diabetes which include CAD, CVA, CKD, retinopathy, and neuropathy. These are all discussed in detail with the patient. Patient came with above target fasting glucose profile, his A1c is increasing to 8.6% from 8.2%.  -  Glucose logs and insulin administration records pertaining to this visit,  to be scanned into patient's records.  Recent labs reviewed.   - I have re-counseled the patient on diet management and weight loss  by adopting a carbohydrate restricted / protein rich  Diet.  -  Suggestion is made for him to avoid simple carbohydrates  from his diet including Cakes, Sweet Desserts / Pastries, Ice Cream, Soda (diet and regular), Sweet Tea, Candies, Chips, Cookies, Store Bought Juices, Alcohol in Excess of  1-2 drinks a day, Artificial Sweeteners, and "Sugar-free" Products. This will help patient to have stable blood glucose profile and potentially avoid unintended weight gain.   - Patient is advised to stick to a routine mealtimes to eat 3 meals  a day and avoid unnecessary  snacks ( to snack only to correct hypoglycemia).   - I have approached patient with the following individualized plan to manage diabetes and patient agrees.   -He will continue metformin 1000 g by mouth twice a day with meals.  - I will increase Lantus (will switch to Toujeo) to 70 units daily at bedtime associated with monitoring of blood glucose daily before breakfast and as needed.  - Patient specific target  for A1c; LDL, HDL, Triglycerides, and  Waist Circumference were discussed in detail.  2) BP/HTN:   His blood pressure is controlled to target.  He is advised  to continue current medications including ACEI/ARB. 3) Lipids/HPL:  controlled with  triglycerides improving to 136 from 434, LDL improving to 58.  I advised him to continue atorvastatin 40 mg by mouth daily at bedtime and  Niaspan 1000 mg by mouth daily at bedtime.  4)  Weight/Diet: He has gained 5 more pounds since last visit, CDE consult in progress, exercise, and carbohydrates information provided.  5) Chronic Care/Health Maintenance:  -Patient  is on ACEI/ARB and Statin medications and encouraged to continue to follow up with Ophthalmology, Podiatrist at least yearly or according to recommendations, and advised to  stay away from smoking. I have recommended yearly flu vaccine and pneumonia vaccination at least every 5 years; moderate intensity exercise for up to 150 minutes weekly; and  sleep for at least 7 hours a day.  - Time spent with the patient: 25 min, of which >50% was spent in reviewing his blood glucose logs , discussing his hypo- and hyper-glycemic episodes, reviewing his current and  previous labs and insulin doses and developing a plan to avoid hypo- and hyper-glycemia. Please refer to Patient Instructions for Blood Glucose Monitoring and Insulin/Medications Dosing Guide"  in media tab for additional information.   - I advised patient to maintain close follow up with Redmond School, MD for primary care  needs.   Follow up  plan: -Return in about 3 months (around 08/22/2017) for follow up with pre-visit labs, meter, and logs.  Glade Lloyd, MD Phone: [phone]  Fax: [phone]  -  This note was partially dictated with voice recognition software. Similar sounding words can be transcribed inadequately or may not  be corrected upon review.  05/25/2017, 3:41 PM

## 2017-05-29 LAB — CUP PACEART INCLINIC DEVICE CHECK
Date Time Interrogation Session: 20190211154208
Implantable Lead Location: 753860
Lead Channel Setting Pacing Amplitude: 2.5 V
Lead Channel Setting Pacing Pulse Width: 0.4 ms
Lead Channel Setting Sensing Sensitivity: 0.5 mV
MDC IDC LEAD IMPLANT DT: 20101028
MDC IDC PG IMPLANT DT: 20101028
MDC IDC PG SERIAL: 737526

## 2017-05-30 ENCOUNTER — Encounter: Payer: Self-pay | Admitting: Cardiology

## 2017-06-26 ENCOUNTER — Ambulatory Visit (INDEPENDENT_AMBULATORY_CARE_PROVIDER_SITE_OTHER): Payer: Medicare Other | Admitting: *Deleted

## 2017-06-26 DIAGNOSIS — I255 Ischemic cardiomyopathy: Secondary | ICD-10-CM | POA: Diagnosis not present

## 2017-06-26 NOTE — Progress Notes (Signed)
Remote ICD transmission.   

## 2017-06-28 ENCOUNTER — Encounter: Payer: Self-pay | Admitting: Cardiology

## 2017-07-15 LAB — CUP PACEART REMOTE DEVICE CHECK
Battery Remaining Percentage: 36 %
Brady Statistic RV Percent Paced: 1 %
Date Time Interrogation Session: 20190311125209
HIGH POWER IMPEDANCE MEASURED VALUE: 43 Ohm
HIGH POWER IMPEDANCE MEASURED VALUE: 54 Ohm
Implantable Lead Implant Date: 20101028
Implantable Pulse Generator Implant Date: 20101028
Lead Channel Impedance Value: 460 Ohm
Lead Channel Pacing Threshold Amplitude: 0.75 V
Lead Channel Pacing Threshold Pulse Width: 0.4 ms
Lead Channel Sensing Intrinsic Amplitude: 11.9 mV
MDC IDC LEAD LOCATION: 753860
MDC IDC MSMT BATTERY REMAINING LONGEVITY: 43 mo
MDC IDC MSMT BATTERY VOLTAGE: 2.89 V
MDC IDC PG SERIAL: 737526
MDC IDC SET LEADCHNL RV PACING AMPLITUDE: 2.5 V
MDC IDC SET LEADCHNL RV PACING PULSEWIDTH: 0.4 ms
MDC IDC SET LEADCHNL RV SENSING SENSITIVITY: 0.5 mV

## 2017-07-26 ENCOUNTER — Ambulatory Visit (HOSPITAL_COMMUNITY)
Admission: RE | Admit: 2017-07-26 | Discharge: 2017-07-26 | Disposition: A | Discharge status: Home / Self Care | Payer: Medicare Other | Source: Ambulatory Visit | Attending: Internal Medicine | Admitting: Internal Medicine

## 2017-07-26 ENCOUNTER — Other Ambulatory Visit (HOSPITAL_COMMUNITY): Payer: Self-pay | Admitting: Internal Medicine

## 2017-07-26 DIAGNOSIS — R05 Cough: Secondary | ICD-10-CM | POA: Diagnosis not present

## 2017-07-26 DIAGNOSIS — R059 Cough, unspecified: Secondary | ICD-10-CM

## 2017-07-26 DIAGNOSIS — I701 Atherosclerosis of renal artery: Secondary | ICD-10-CM | POA: Diagnosis not present

## 2017-07-26 DIAGNOSIS — J9801 Acute bronchospasm: Secondary | ICD-10-CM | POA: Diagnosis not present

## 2017-07-26 DIAGNOSIS — Z6824 Body mass index (BMI) 24.0-24.9, adult: Secondary | ICD-10-CM | POA: Diagnosis not present

## 2017-07-26 DIAGNOSIS — J189 Pneumonia, unspecified organism: Secondary | ICD-10-CM | POA: Diagnosis not present

## 2017-07-26 DIAGNOSIS — E291 Testicular hypofunction: Secondary | ICD-10-CM | POA: Diagnosis not present

## 2017-07-26 DIAGNOSIS — E1122 Type 2 diabetes mellitus with diabetic chronic kidney disease: Secondary | ICD-10-CM | POA: Diagnosis not present

## 2017-07-26 DIAGNOSIS — R509 Fever, unspecified: Secondary | ICD-10-CM | POA: Diagnosis not present

## 2017-07-26 DIAGNOSIS — R0600 Dyspnea, unspecified: Secondary | ICD-10-CM | POA: Diagnosis not present

## 2017-07-26 DIAGNOSIS — N182 Chronic kidney disease, stage 2 (mild): Secondary | ICD-10-CM | POA: Diagnosis not present

## 2017-07-26 DIAGNOSIS — I10 Essential (primary) hypertension: Secondary | ICD-10-CM | POA: Diagnosis not present

## 2017-08-22 ENCOUNTER — Ambulatory Visit (INDEPENDENT_AMBULATORY_CARE_PROVIDER_SITE_OTHER): Payer: Medicare Other | Admitting: Cardiovascular Disease

## 2017-08-22 ENCOUNTER — Encounter: Payer: Self-pay | Admitting: Cardiovascular Disease

## 2017-08-22 DIAGNOSIS — I255 Ischemic cardiomyopathy: Secondary | ICD-10-CM | POA: Diagnosis not present

## 2017-08-22 DIAGNOSIS — I714 Abdominal aortic aneurysm, without rupture, unspecified: Secondary | ICD-10-CM

## 2017-08-22 DIAGNOSIS — I2510 Atherosclerotic heart disease of native coronary artery without angina pectoris: Secondary | ICD-10-CM

## 2017-08-22 DIAGNOSIS — I10 Essential (primary) hypertension: Secondary | ICD-10-CM | POA: Diagnosis not present

## 2017-08-22 DIAGNOSIS — E7800 Pure hypercholesterolemia, unspecified: Secondary | ICD-10-CM | POA: Diagnosis not present

## 2017-08-22 DIAGNOSIS — Z95810 Presence of automatic (implantable) cardiac defibrillator: Secondary | ICD-10-CM

## 2017-08-22 NOTE — Patient Instructions (Signed)

## 2017-08-22 NOTE — Assessment & Plan Note (Signed)
History of ischemic cardia myopathy without overt symptoms of heart failure.  He appears to be euvolemic on appropriate medications.  He has lost 14 pounds since he was here last 3 months ago.

## 2017-08-22 NOTE — Assessment & Plan Note (Signed)
History of abdominal aortic aneurysm status post endoluminal stent grafting 12/17 by Dr. Trula Slade which he follows

## 2017-08-22 NOTE — Assessment & Plan Note (Signed)
History of ICD implantation October 2010 followed by Dr. Sallyanne Kuster.

## 2017-08-22 NOTE — Assessment & Plan Note (Signed)
History of CAD status post bypass grafting in September 2001 by Dr. Roxy Manns .  He had a LIMA to his LAD, vein to a ramus branch circumflex and PDA.  His EF was 35% at that time.  His last Myoview performed 04/07/2011 showed anterior scar without ischemia.  He denies chest pain is chronically short of breath from COPD

## 2017-08-22 NOTE — Progress Notes (Signed)
08/22/2017 Jordan Ross   1946/03/14  [id number]  Primary Physician Redmond School, MD Primary Cardiologist: Lorretta Harp MD Lupe Carney, [place]  HPI:  Jordan Ross is a 72 y.o.  mildly overweight widowed Caucasian male father of one child who is retired from working at [employer] and [employer]. He is formally a patient of Dr. Terance Ice I last saw him 04/01/2015. He is a long complicated past medical history notable for coronary artery disease status post anterior wall myocardial infarction in 2001 treated with coronary artery bypass grafting by Dr. Roxy Manns September 2001. His LIMA to his LAD, vein graft to a ramus branch, circumflex and PDA. His EF was in the 35% range.essentially underwent ICD implantation for primary prevention October 2000 which was followed by Dr. Rollene Fare. His other problems include a known abdominal aortic aneurysm measuring 5 cm following the ultrasound semiannually. He has a history of hypertension, hyperlipidemia and non-insulin-requiring diabetes. He has chronic shortness of breath is likely related to a history of tobacco abuse having smoked 40-pack-years and quit back in 2001 at the time of his open heart surgery but denies chest pain. The stress Myoview performed 04/07/11 showed anterior scar without ischemia. Since I saw him one year ago he has remained asymptomatic. Dr. Sallyanne Kuster follows him for his ICD. Since I saw him a year ago he has undergone endoluminal stent grafting by Dr. Trula Slade 12/17 for abdominal aortic aneurysm.  He otherwise has remained stable with chronic shortness of breath but denies chest pain.   Current Meds  Medication Sig  . aspirin 81 MG tablet Take 81 mg by mouth daily.   [name] atorvastatin (LIPITOR) 40 MG tablet Take 40 mg by mouth daily.  . carvedilol (COREG) 12.5 MG tablet Take 12.5 mg by mouth 2 (two) times daily.  . digoxin (LANOXIN) 0.125 MG tablet Take 125 mcg by mouth daily.  [name] FORA LANCETS MISC Test bid  as directed. E11.65.  [name] glucose blood (FORA V10 BLOOD GLUCOSE TEST) test strip Use as instructed bid. E11.65  . glucose blood test strip Use as instructed  . Insulin Glargine (TOUJEO SOLOSTAR) 300 UNIT/ML SOPN Inject 70 Units into the skin at bedtime.  . IRON PO Take 65 mg by mouth daily.  [name] LANCETS ULTRA THIN MISC Test BG every morning  . LITETOUCH PEN NEEDLES 31G X 8 MM MISC USE AT BEDTIME AS DIRECTED.  [name] metFORMIN (GLUCOPHAGE) 500 MG tablet Take 1,000 mg by mouth 2 (two) times daily with a meal.   . niacin (NIASPAN) 1000 MG CR tablet Take 1 tablet (1,000 mg total) by mouth daily.  . pantoprazole (PROTONIX) 40 MG tablet Take 40 mg by mouth daily.  [name] spironolactone (ALDACTONE) 25 MG tablet Take 25 mg by mouth daily.  . valsartan (DIOVAN) 160 MG tablet Take 160 mg by mouth daily.     No Known Allergies  Social History   Socioeconomic History  . Marital status: Widowed    Spouse name: Not on file  . Number of children: Not on file  . Years of education: Not on file  . Highest education level: Not on file  Occupational History  . Not on file  Social Needs  . Financial resource strain: Not on file  . Food insecurity:    Worry: Not on file    Inability: Not on file  . Transportation needs:    Medical: Not on file    Non-medical: Not on file  Tobacco Use  .  Smoking status: Former Smoker    Years: 40.00  . Smokeless tobacco: Never Used  . Tobacco comment: quit smoking in 2001  Substance and Sexual Activity  . Alcohol use: No  . Drug use: No  . Sexual activity: Yes  Lifestyle  . Physical activity:    Days per week: Not on file    Minutes per session: Not on file  . Stress: Not on file  Relationships  . Social connections:    Talks on phone: Not on file    Gets together: Not on file    Attends religious service: Not on file    Active member of club or organization: Not on file    Attends meetings of clubs or organizations: Not on file    Relationship status: Not on file   . Intimate partner violence:    Fear of current or ex partner: Not on file    Emotionally abused: Not on file    Physically abused: Not on file    Forced sexual activity: Not on file  Other Topics Concern  . Not on file  Social History Narrative  . Not on file     Review of Systems: General: negative for chills, fever, night sweats or weight changes.  Cardiovascular: negative for chest pain, dyspnea on exertion, edema, orthopnea, palpitations, paroxysmal nocturnal dyspnea or shortness of breath Dermatological: negative for rash Respiratory: negative for cough or wheezing Urologic: negative for hematuria Abdominal: negative for nausea, vomiting, diarrhea, bright red blood per rectum, melena, or hematemesis Neurologic: negative for visual changes, syncope, or dizziness All other systems reviewed and are otherwise negative except as noted above.    Blood pressure (!) 142/92, pulse 80, height 6' (1.829 m), weight 186 lb 9.6 oz (84.6 kg).  General appearance: alert and no distress Neck: no adenopathy, no carotid bruit, no JVD, supple, symmetrical, trachea midline and thyroid not enlarged, symmetric, no tenderness/mass/nodules Lungs: clear to auscultation bilaterally Heart: regular rate and rhythm, S1, S2 normal, no murmur, click, rub or gallop Extremities: extremities normal, atraumatic, no cyanosis or edema Pulses: 2+ and symmetric Skin: Skin color, texture, turgor normal. No rashes or lesions Neurologic: Alert and oriented X 3, normal strength and tone. Normal symmetric reflexes. Normal coordination and gait  EKG sinus rhythm at 80 with an IVCD and left axis deviation.  I personally reviewed this EKG.  There were anteroseptal Q waves noted as well.  ASSESSMENT AND PLAN:   Coronary artery disease History of CAD status post bypass grafting in September 2001 by Dr. Roxy Manns .  He had a LIMA to his LAD, vein to a ramus branch circumflex and PDA.  His EF was 35% at that time.  His last  Myoview performed 04/07/2011 showed anterior scar without ischemia.  He denies chest pain is chronically short of breath from COPD  Cardiomyopathy, ischemic History of ischemic cardia myopathy without overt symptoms of heart failure.  He appears to be euvolemic on appropriate medications.  He has lost 14 pounds since he was here last 3 months ago.  ICD (implantable cardioverter-defibrillator), single, in situ History of ICD implantation October 2010 followed by Dr. Sallyanne Kuster.  Essential hypertension History of essential hypertension her blood pressure measured at 142/92.  He is on valsartan and carvedilol.  Continue current meds at current dosing.  Hyperlipidemia History of hyperlipidemia on statin therapy with lipid profile performed 12/23/2016 revealing an LDL 58 and HDL of 41.  Abdominal aortic aneurysm History of abdominal aortic aneurysm status post endoluminal stent  grafting 12/17 by Dr. Trula Slade which he follows      Lorretta Harp MD [facility], [facility] 08/22/2017 3:08 PM

## 2017-08-22 NOTE — Assessment & Plan Note (Signed)
History of essential hypertension her blood pressure measured at 142/92.  He is on valsartan and carvedilol.  Continue current meds at current dosing.

## 2017-08-22 NOTE — Assessment & Plan Note (Signed)
History of hyperlipidemia on statin therapy with lipid profile performed 12/23/2016 revealing an LDL 58 and HDL of 41.

## 2017-08-23 ENCOUNTER — Ambulatory Visit: Payer: Medicare Other | Admitting: "Endocrinology

## 2017-08-30 IMAGING — DX DG CHEST 1V PORT
1 series · 1 of 1 positions shown · non-contrast
Comparison: 05/11/2015

CLINICAL DATA: Aortic stent graft

EXAM:
PORTABLE CHEST 1 VIEW

[chest ap]
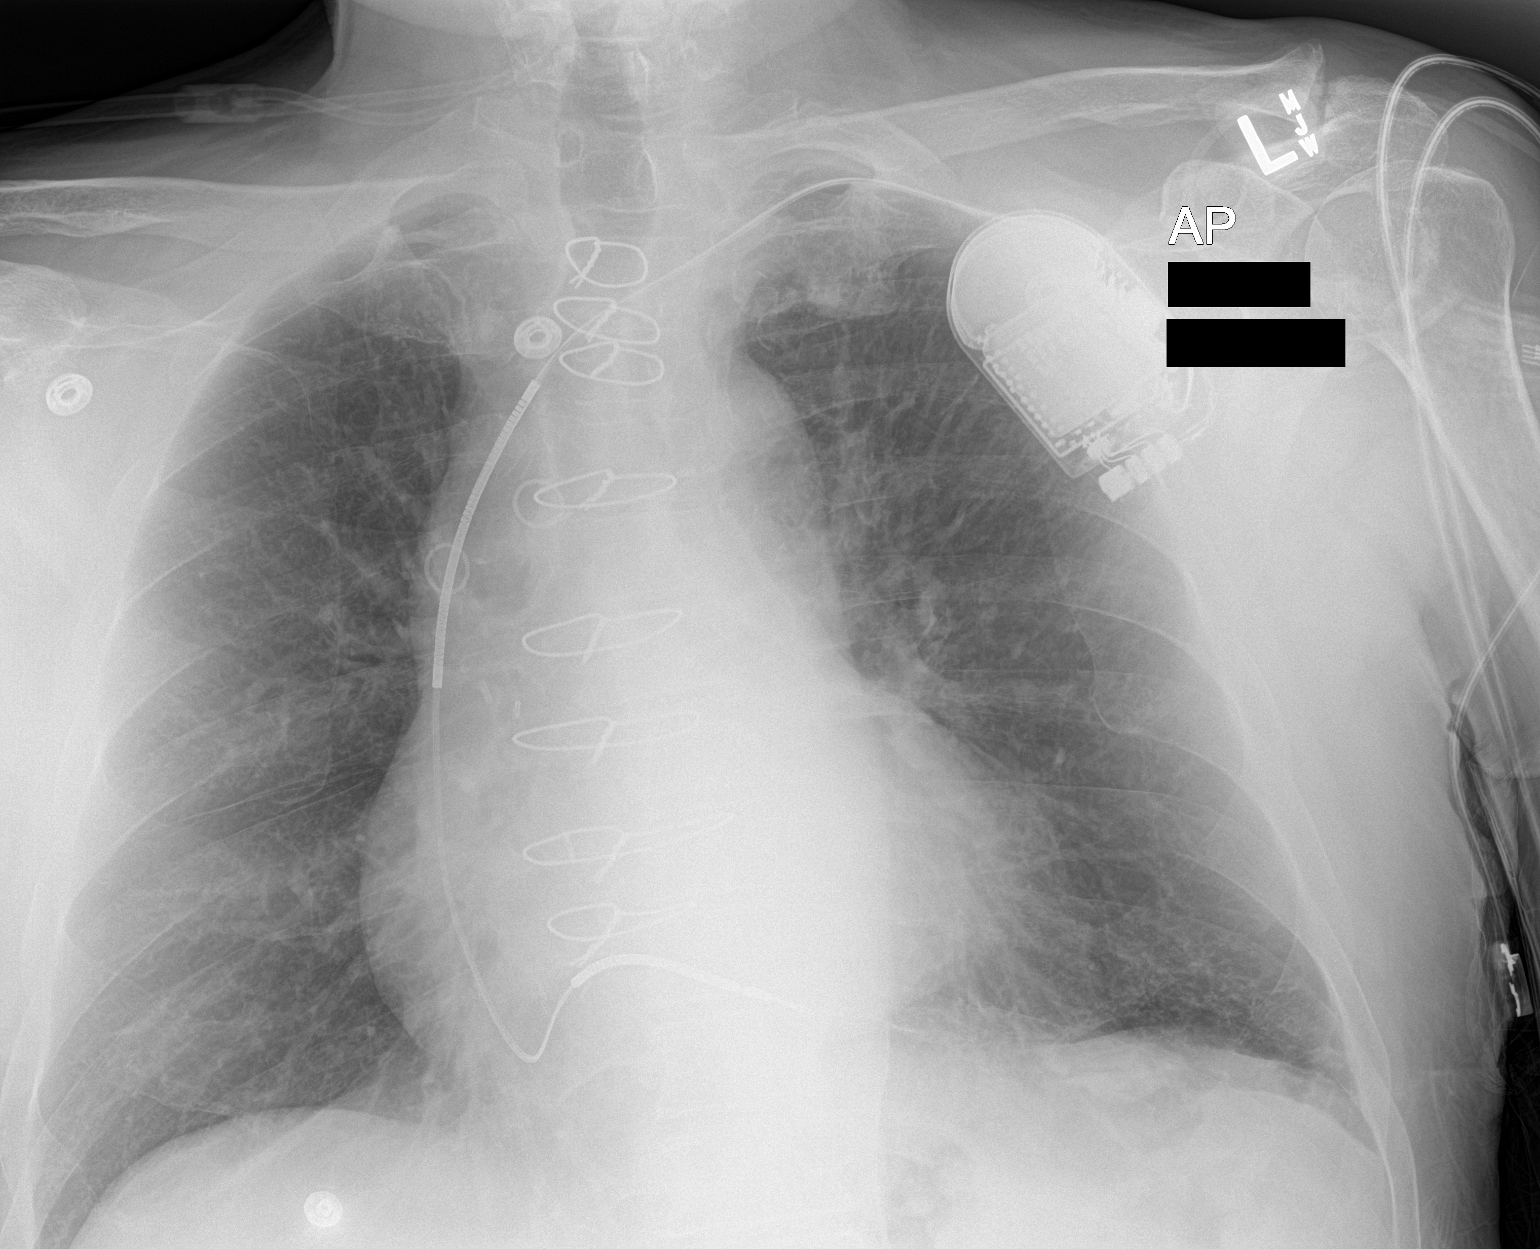

[1 of 1 positions shown; findings below may reference images not displayed]

FINDINGS: Left subclavian AICD device is stable. Lungs are clear. Borderline
cardiomegaly. Normal vascularity. Postoperative changes.
IMPRESSION: No active disease.

## 2017-09-25 ENCOUNTER — Ambulatory Visit (INDEPENDENT_AMBULATORY_CARE_PROVIDER_SITE_OTHER): Payer: Medicare Other | Admitting: *Deleted

## 2017-09-25 DIAGNOSIS — I255 Ischemic cardiomyopathy: Secondary | ICD-10-CM

## 2017-09-25 NOTE — Progress Notes (Signed)
Remote ICD transmission.   

## 2017-09-26 ENCOUNTER — Encounter: Payer: Self-pay | Admitting: Cardiology

## 2017-10-10 LAB — CUP PACEART REMOTE DEVICE CHECK
Battery Remaining Percentage: 35 %
Battery Voltage: 2.89 V
Brady Statistic RV Percent Paced: 1 %
HighPow Impedance: 50 Ohm
Implantable Lead Implant Date: 20101028
Implantable Lead Location: 753860
Lead Channel Impedance Value: 460 Ohm
Lead Channel Pacing Threshold Amplitude: 0.75 V
Lead Channel Sensing Intrinsic Amplitude: 11.9 mV
Lead Channel Setting Pacing Amplitude: 2.5 V
MDC IDC MSMT BATTERY REMAINING LONGEVITY: 42 mo
MDC IDC MSMT LEADCHNL RV PACING THRESHOLD PULSEWIDTH: 0.4 ms
MDC IDC PG IMPLANT DT: 20101028
MDC IDC PG SERIAL: 737526
MDC IDC SESS DTM: 20190610080308
MDC IDC SET LEADCHNL RV PACING PULSEWIDTH: 0.4 ms
MDC IDC SET LEADCHNL RV SENSING SENSITIVITY: 0.5 mV

## 2017-11-28 ENCOUNTER — Telehealth: Payer: Self-pay | Admitting: Cardiology

## 2017-11-28 NOTE — Telephone Encounter (Signed)
LMOVM requesting that pt send manual transmission b/c home monitor has not updated in at least 7 days.    

## 2017-12-05 ENCOUNTER — Telehealth: Payer: Self-pay | Admitting: Cardiology

## 2017-12-05 NOTE — Telephone Encounter (Signed)
LMOVM requesting that pt send manual transmission b/c home monitor has not updated in at least 7 days.    

## 2017-12-12 ENCOUNTER — Telehealth: Payer: Self-pay | Admitting: Cardiology

## 2017-12-12 NOTE — Telephone Encounter (Signed)
Spoke w/ pt and requested that he send a manual transmission b/c his home monitor has not updated in at least 7 days.   

## 2017-12-19 ENCOUNTER — Telehealth: Payer: Self-pay | Admitting: Cardiology

## 2017-12-19 NOTE — Telephone Encounter (Signed)
LMOVM requesting that pt send manual transmission b/c home monitor has not updated in at least 7 days.    

## 2017-12-25 ENCOUNTER — Ambulatory Visit (INDEPENDENT_AMBULATORY_CARE_PROVIDER_SITE_OTHER): Payer: Medicare Other | Admitting: *Deleted

## 2017-12-25 DIAGNOSIS — I255 Ischemic cardiomyopathy: Secondary | ICD-10-CM | POA: Diagnosis not present

## 2017-12-25 NOTE — Progress Notes (Signed)
Remote ICD transmission.   

## 2017-12-26 ENCOUNTER — Encounter: Payer: Self-pay | Admitting: Cardiology

## 2017-12-29 ENCOUNTER — Encounter: Payer: Self-pay | Admitting: Cardiovascular Disease

## 2017-12-29 ENCOUNTER — Ambulatory Visit (INDEPENDENT_AMBULATORY_CARE_PROVIDER_SITE_OTHER): Payer: Medicare Other | Admitting: Cardiovascular Disease

## 2017-12-29 VITALS — BP 152/94 | HR 80 | Ht 72.0 in | Wt 185.2 lb

## 2017-12-29 DIAGNOSIS — I712 Thoracic aortic aneurysm, without rupture: Secondary | ICD-10-CM | POA: Diagnosis not present

## 2017-12-29 DIAGNOSIS — I7121 Aneurysm of the ascending aorta, without rupture: Secondary | ICD-10-CM

## 2017-12-29 DIAGNOSIS — I10 Essential (primary) hypertension: Secondary | ICD-10-CM

## 2017-12-29 DIAGNOSIS — I255 Ischemic cardiomyopathy: Secondary | ICD-10-CM | POA: Diagnosis not present

## 2017-12-29 DIAGNOSIS — E119 Type 2 diabetes mellitus without complications: Secondary | ICD-10-CM

## 2017-12-29 DIAGNOSIS — I714 Abdominal aortic aneurysm, without rupture, unspecified: Secondary | ICD-10-CM

## 2017-12-29 DIAGNOSIS — I5022 Chronic systolic (congestive) heart failure: Secondary | ICD-10-CM

## 2017-12-29 DIAGNOSIS — Z95810 Presence of automatic (implantable) cardiac defibrillator: Secondary | ICD-10-CM | POA: Diagnosis not present

## 2017-12-29 DIAGNOSIS — I25810 Atherosclerosis of coronary artery bypass graft(s) without angina pectoris: Secondary | ICD-10-CM

## 2017-12-29 DIAGNOSIS — E785 Hyperlipidemia, unspecified: Secondary | ICD-10-CM | POA: Diagnosis not present

## 2017-12-29 NOTE — Progress Notes (Signed)
Cardiology Office Note    Date:  12/30/2017   ID:  Jordan Ross, DOB 11/30/1945, MRN [id number]  PCP:  Redmond School, MD  Cardiologist:  Quay Burow, M.D. Sanda Klein, MD   Chief Complaint  Patient presents with  . Congestive Heart Failure    ICD check    History of Present Illness:  Jordan Ross is a 72 y.o. male for defibrillator follow-up. He last saw Dr. Gwenlyn Found for general cardiology follow-up in May 2019 and had post [facility] [place] evaluation (Dr. Trula Slade) in October 2018.  He is a Actor.  He feels great.  He has no cardiovascular complaints.  He enjoys dancing (shagging).  Pulled his back while lifting up a riding lawnmower, but improving.  He continues to live independently.  The patient specifically denies any chest pain at rest exertion, dyspnea at rest or with exertion, orthopnea, paroxysmal nocturnal dyspnea, syncope, palpitations, focal neurological deficits, intermittent claudication, lower extremity edema, unexplained weight gain, cough, hemoptysis or wheezing.  Interrogation of his single-chamber St. Jude fortify ICD shows normal device function. The device was implanted in 2010 and has about 3.4 years of remaining longevity. It has not recorded VT or VF and has never delivered therapy.  He never requires ventricular pacing. Lead parameters are excellent. Corvue remains quite volatile and has never really correlated with symptoms or signs of fluid overload in this patient.  He presented with an anterior wall myocardial infarction in 2001 treated with coronary artery bypass grafting by Dr. Roxy Manns (LIMA to his LAD, SVGs to a ramus branch, circumflex and PDA). His EF was in the 35% range. He underwent ICD implantation (single-chamber St. Jude Fortify) for primary prevention October 2010. He has not received either appropriate or inappropriate shocks. His other problems include a known abdominal aortic aneurysm s/p EVAR 05/2015, bilateral renal artery stenosis,  hypertension, hyperlipidemia and non-insulin-requiring diabetes. He has chronic shortness of breath, likely related to a history of tobacco abuse, having smoked 40-pack-years, quit 2001 at the time of his open heart surgery. The stress Myoview performed 04/07/11 showed anterior scar without ischemia.  Past Medical History:  Diagnosis Date  . Abdominal aortic aneurysm ([place])   . AICD (automatic cardioverter/defibrillator) present    St Jude  . Anemia    iron deficiency anemia,takes iron pill daily  . Arthritis   . Coronary artery disease    AWMI 2001  . Diverticulosis   . GERD (gastroesophageal reflux disease)    takes Protonix daily  . History of colon polyps    benign  . History of migraine 40 yrs ago  . History of shingles   . Hyperlipidemia    takes Atorvastatin and Niacin daily  . Hypertension    takes Diovan,Spironolactone, and Carvedilol daily  . ICD (implantable cardioverter-defibrillator), single, in situ   . Ischemic cardiomyopathy   . Joint swelling   . LV dysfunction    takes Digoxin daily  . Myocardial infarction ([place]) 2001  . Pneumonia 40 yrs ago   hx of  . Shortness of breath dyspnea    with exertion.States he was a smoker for yr  . Status post coronary artery bypass grafting   . Type 2 diabetes mellitus (HCC)    takes Glipizide,Metformin,Invokana,and Lantus daily.Average fasting blood sugar runs about 140    Past Surgical History:  Procedure Laterality Date  . ABDOMINAL AORTIC ENDOVASCULAR STENT GRAFT N/A 05/22/2015   Procedure: ABDOMINAL AORTIC ENDOVASCULAR STENT GRAFT;  Surgeon: Serafina Mitchell, MD;  Location:  [facility];  Service: Vascular;  Laterality: N/A;  . CARDIAC CATHETERIZATION  2001  . COLONOSCOPY N/A 03/21/2014   Procedure: COLONOSCOPY;  Surgeon: Rogene Houston, MD;  [facility];  Service: Endoscopy;  Laterality: N/A;  855 - moved to 12/4 @ 8:30 - Ann notified pt  . CORONARY ARTERY BYPASS GRAFT  2001   unsure if it was 3 or 4  . DOPPLER  ECHOCARDIOGRAPHY     2011 38% EF.2013 EF 30-35%    Current Medications: Outpatient Medications Prior to Visit  Medication Sig Dispense Refill  . aspirin 81 MG tablet Take 81 mg by mouth daily.     [name] atorvastatin (LIPITOR) 40 MG tablet Take 40 mg by mouth daily.    . carvedilol (COREG) 12.5 MG tablet Take 12.5 mg by mouth 2 (two) times daily.    . digoxin (LANOXIN) 0.125 MG tablet Take 125 mcg by mouth daily.    [name] FORA LANCETS MISC Test bid as directed. E11.65. 100 each 5  . glucose blood (FORA V10 BLOOD GLUCOSE TEST) test strip Use as instructed bid. E11.65 100 each 5  . glucose blood test strip Use as instructed 100 each 2  . Insulin Glargine (TOUJEO SOLOSTAR) 300 UNIT/ML SOPN Inject 70 Units into the skin at bedtime. 6 pen 2  . IRON PO Take 65 mg by mouth daily.    [name] LANCETS ULTRA THIN MISC Test BG every morning 100 each 5  . LITETOUCH PEN NEEDLES 31G X 8 MM MISC USE AT BEDTIME AS DIRECTED. 50 each 5  . metFORMIN (GLUCOPHAGE) 500 MG tablet Take 1,000 mg by mouth 2 (two) times daily with a meal.     . niacin (NIASPAN) 1000 MG CR tablet Take 1 tablet (1,000 mg total) by mouth daily. 30 tablet 3  . pantoprazole (PROTONIX) 40 MG tablet Take 40 mg by mouth daily.    [name] spironolactone (ALDACTONE) 25 MG tablet Take 25 mg by mouth daily.    . valsartan (DIOVAN) 160 MG tablet Take 160 mg by mouth daily.     No facility-administered medications prior to visit.      Allergies:   Patient has no known allergies.   Social History   Socioeconomic History  . Marital status: Widowed    Spouse name: Not on file  . Number of children: Not on file  . Years of education: Not on file  . Highest education level: Not on file  Occupational History  . Not on file  Social Needs  . Financial resource strain: Not on file  . Food insecurity:    Worry: Not on file    Inability: Not on file  . Transportation needs:    Medical: Not on file    Non-medical: Not on file  Tobacco Use  . Smoking status:  Former Smoker    Years: 40.00  . Smokeless tobacco: Never Used  . Tobacco comment: quit smoking in 2001  Substance and Sexual Activity  . Alcohol use: No  . Drug use: No  . Sexual activity: Yes  Lifestyle  . Physical activity:    Days per week: Not on file    Minutes per session: Not on file  . Stress: Not on file  Relationships  . Social connections:    Talks on phone: Not on file    Gets together: Not on file    Attends religious service: Not on file    Active member of club or organization: Not on file  Attends meetings of clubs or organizations: Not on file    Relationship status: Not on file  Other Topics Concern  . Not on file  Social History Narrative  . Not on file     Family History:  The patient's family history includes COPD in his mother; Heart disease in his brother.   ROS:   Please see the history of present illness.    ROS all other systems are reviewed and are negative PHYSICAL EXAM:   VS:  BP (!) 152/94 (BP Location: Left Arm, Patient Position: Sitting, Cuff Size: Normal)   Pulse 80   Ht 6' (1.829 m)   Wt 185 lb 3.2 oz (84 kg)   BMI 25.12 kg/m     General: Alert, oriented x3, no distress, healthy left subclavian defibrillator site Head: no evidence of trauma, PERRL, EOMI, no exophtalmos or lid lag, no myxedema, no xanthelasma; normal ears, nose and oropharynx Neck: normal jugular venous pulsations and no hepatojugular reflux; brisk carotid pulses without delay and no carotid bruits Chest: clear to auscultation, no signs of consolidation by percussion or palpation, normal fremitus, symmetrical and full respiratory excursions Cardiovascular: normal position and quality of the apical impulse, regular rhythm, normal first and second heart sounds, no murmurs, rubs or gallops Abdomen: no tenderness or distention, no masses by palpation, no abnormal pulsatility or arterial bruits, normal bowel sounds, no hepatosplenomegaly Extremities: no clubbing, cyanosis  or edema; 2+ radial, ulnar and brachial pulses bilaterally; 2+ right femoral, posterior tibial and dorsalis pedis pulses; 2+ left femoral, posterior tibial and dorsalis pedis pulses; no subclavian or femoral bruits Neurological: grossly nonfocal Psych: Normal mood and affect   Wt Readings from Last 3 Encounters:  12/29/17 185 lb 3.2 oz (84 kg)  08/22/17 186 lb 9.6 oz (84.6 kg)  05/25/17 200 lb (90.7 kg)    Studies/Labs Reviewed:   EKG:  EKG is ordered today.  It shows sinus rhythm with occasional PVCs, left atrial abnormality, QRS has become broader now meets criteria for left bundle branch block, but still has a "frozen anterior MI" pattern, left axis deviation.  QTc 454 ms  Recent Labs: 01/30/2017: TSH 2.12 05/19/2017: ALT 14; BUN 10; Creat 1.07; Potassium 4.9; Sodium 139   Lipid Panel    Component Value Date/Time   CHOL 126 12/23/2016 0943   TRIG 136 12/23/2016 0943   HDL 41 12/23/2016 0943   CHOLHDL 3.1 12/23/2016 0943   CHOLHDL 7.8 (H) 06/14/2016 1458   VLDL NOT CALC 06/14/2016 1458   LDLCALC 58 12/23/2016 0943   May 20, 2017 labs Total cholesterol 121, HDL 36, LDL 65, triglycerides 99 Hemoglobin A1c 8.6% Creatinine 1.07, potassium 4.9, normal LFTs, TSH 2.27  ASSESSMENT:    1. Chronic systolic heart failure ([place])   2. Coronary artery disease involving coronary bypass graft of native heart without angina pectoris   3. ICD (implantable cardioverter-defibrillator), single, in situ   4. AAA (abdominal aortic aneurysm) without rupture ([place])   5. Ascending aortic aneurysm ([place])   6. Essential hypertension   7. Dyslipidemia (high LDL; low HDL)   8. Diabetes mellitus type 2 in nonobese [facility])     PLAN:  In order of problems listed above:  1. CHF: NYHA functional class I, clinically euvolemic, without taking diuretics.  He takes spironolactone and ARB and carvedilol.  2. CAD s/p CABG: Asymptomatic. 3. ICD: Normal device function, remote downloads every 3 months and  office visit yearly.  Cybersecurity/battery management upgrade performed today. 4. AAA s/p EVAR  and ascending aortic aneurysm: followed by Dr. Trula Slade.  Last AAA ultrasound October 2018, 4.17 cm (shrinking).  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  Bovine arch.  He has an upcoming appointment at [facility] next month. 5. HTN: His blood pressure was high today.  Even when I rechecked it 10 minutes later it was still high at 149/90.  Definitely need to shoot for a target less than 130/80 especially since he has aortic aneurysm.  He reports that when he checks his blood pressure at home is typically in the 120s.  No changes were made to his medicines today, but asked him to call us back with some blood pressure readings. 6. HLP: Lipid profile is acceptable although would like to help boost his HDL cholesterol.  He is relatively lean.  The answer may be in improved diabetes control. 7. DM: Control has deteriorated since last year.    Medication Adjustments/Labs and Tests Ordered: Current medicines are reviewed at length with the patient today.  Concerns regarding medicines are outlined above.  Medication changes, Labs and Tests ordered today are listed in the Patient Instructions below. Patient Instructions  Dr Sallyanne Kuster recommends that you continue on your current medications as directed. Please refer to the Current Medication list given to you today.  Remote monitoring is used to monitor your Pacemaker or ICD from home. This monitoring reduces the number of office visits required to check your device to one time per year. It allows [place] to keep an eye on the functioning of your device to ensure it is working properly. You are scheduled for a device check from home on Monday, December 9th, 2019. You may send your transmission at any time that day. If you have a wireless device, the transmission will be sent automatically. After your  physician reviews your transmission, you will receive a notification with your next transmission date.  To improve our patient care and to more adequately follow your device, [facility] has decided, as a practice, to start following each patient four times a year with your home monitor. This means that you may experience a remote appointment that is close to an in-office appointment with your physician. Your insurance will apply at the same rate as other remote monitoring transmissions.  Dr Sallyanne Kuster recommends that you schedule a follow-up appointment in 12 months with an ICD check. You will receive a reminder letter in the mail two months in advance. If you don't receive a letter, please call our office to schedule the follow-up appointment.  If you need a refill on your cardiac medications before your next appointment, please call your pharmacy.    Signed, Sanda Klein, MD  12/30/2017 4:25 PM    [facility] [address], [place], [place]  [address] Phone: [phone]; Fax: [phone]

## 2017-12-29 NOTE — Patient Instructions (Signed)
Dr Sallyanne Kuster recommends that you continue on your current medications as directed. Please refer to the Current Medication list given to you today.  Remote monitoring is used to monitor your Pacemaker or ICD from home. This monitoring reduces the number of office visits required to check your device to one time per year. It allows [place] to keep an eye on the functioning of your device to ensure it is working properly. You are scheduled for a device check from home on Monday, December 9th, 2019. You may send your transmission at any time that day. If you have a wireless device, the transmission will be sent automatically. After your physician reviews your transmission, you will receive a notification with your next transmission date.  To improve our patient care and to more adequately follow your device, [facility] has decided, as a practice, to start following each patient four times a year with your home monitor. This means that you may experience a remote appointment that is close to an in-office appointment with your physician. Your insurance will apply at the same rate as other remote monitoring transmissions.  Dr Sallyanne Kuster recommends that you schedule a follow-up appointment in 12 months with an ICD check. You will receive a reminder letter in the mail two months in advance. If you don't receive a letter, please call our office to schedule the follow-up appointment.  If you need a refill on your cardiac medications before your next appointment, please call your pharmacy.

## 2017-12-30 ENCOUNTER — Encounter: Payer: Self-pay | Admitting: Cardiovascular Disease

## 2018-01-15 DIAGNOSIS — E663 Overweight: Secondary | ICD-10-CM | POA: Diagnosis not present

## 2018-01-15 DIAGNOSIS — R110 Nausea: Secondary | ICD-10-CM | POA: Diagnosis not present

## 2018-01-15 DIAGNOSIS — Z6825 Body mass index (BMI) 25.0-25.9, adult: Secondary | ICD-10-CM | POA: Diagnosis not present

## 2018-01-15 DIAGNOSIS — R1013 Epigastric pain: Secondary | ICD-10-CM | POA: Diagnosis not present

## 2018-01-15 DIAGNOSIS — K5900 Constipation, unspecified: Secondary | ICD-10-CM | POA: Diagnosis not present

## 2018-01-15 DIAGNOSIS — Z23 Encounter for immunization: Secondary | ICD-10-CM | POA: Diagnosis not present

## 2018-01-15 DIAGNOSIS — Z1389 Encounter for screening for other disorder: Secondary | ICD-10-CM | POA: Diagnosis not present

## 2018-01-16 ENCOUNTER — Other Ambulatory Visit (HOSPITAL_COMMUNITY): Payer: Self-pay | Admitting: Family Medicine

## 2018-01-16 DIAGNOSIS — K5900 Constipation, unspecified: Secondary | ICD-10-CM

## 2018-01-16 DIAGNOSIS — R110 Nausea: Secondary | ICD-10-CM

## 2018-01-16 DIAGNOSIS — R1013 Epigastric pain: Secondary | ICD-10-CM

## 2018-01-16 LAB — CUP PACEART REMOTE DEVICE CHECK
Battery Remaining Longevity: 41 mo
Battery Remaining Percentage: 34 %
HighPow Impedance: 49 Ohm
Implantable Lead Location: 753860
Implantable Pulse Generator Implant Date: 20101028
Lead Channel Sensing Intrinsic Amplitude: 11.9 mV
Lead Channel Setting Pacing Amplitude: 2.5 V
Lead Channel Setting Pacing Pulse Width: 0.4 ms
Lead Channel Setting Sensing Sensitivity: 0.5 mV
MDC IDC LEAD IMPLANT DT: 20101028
MDC IDC MSMT BATTERY VOLTAGE: 2.87 V
MDC IDC MSMT LEADCHNL RV IMPEDANCE VALUE: 450 Ohm
MDC IDC MSMT LEADCHNL RV PACING THRESHOLD AMPLITUDE: 0.75 V
MDC IDC MSMT LEADCHNL RV PACING THRESHOLD PULSEWIDTH: 0.4 ms
MDC IDC SESS DTM: 20190909083104
MDC IDC STAT BRADY RV PERCENT PACED: 1 %
Pulse Gen Serial Number: 737526

## 2018-01-19 DIAGNOSIS — Z1210 Encounter for screening for malignant neoplasm of intestinal tract, unspecified: Secondary | ICD-10-CM | POA: Diagnosis not present

## 2018-01-19 DIAGNOSIS — R634 Abnormal weight loss: Secondary | ICD-10-CM | POA: Diagnosis not present

## 2018-01-19 DIAGNOSIS — R1013 Epigastric pain: Secondary | ICD-10-CM | POA: Diagnosis not present

## 2018-01-19 DIAGNOSIS — R1110 Vomiting, unspecified: Secondary | ICD-10-CM | POA: Diagnosis not present

## 2018-01-19 DIAGNOSIS — I428 Other cardiomyopathies: Secondary | ICD-10-CM | POA: Diagnosis not present

## 2018-01-19 DIAGNOSIS — Z95810 Presence of automatic (implantable) cardiac defibrillator: Secondary | ICD-10-CM | POA: Diagnosis not present

## 2018-01-19 DIAGNOSIS — K5900 Constipation, unspecified: Secondary | ICD-10-CM | POA: Diagnosis not present

## 2018-01-19 DIAGNOSIS — K921 Melena: Secondary | ICD-10-CM | POA: Diagnosis not present

## 2018-01-19 DIAGNOSIS — Z1211 Encounter for screening for malignant neoplasm of colon: Secondary | ICD-10-CM | POA: Diagnosis not present

## 2018-01-24 DIAGNOSIS — K921 Melena: Secondary | ICD-10-CM | POA: Diagnosis not present

## 2018-01-24 DIAGNOSIS — K5900 Constipation, unspecified: Secondary | ICD-10-CM | POA: Diagnosis not present

## 2018-01-24 DIAGNOSIS — E119 Type 2 diabetes mellitus without complications: Secondary | ICD-10-CM | POA: Diagnosis not present

## 2018-01-24 DIAGNOSIS — R1110 Vomiting, unspecified: Secondary | ICD-10-CM | POA: Diagnosis not present

## 2018-01-24 DIAGNOSIS — R634 Abnormal weight loss: Secondary | ICD-10-CM | POA: Diagnosis not present

## 2018-01-24 DIAGNOSIS — E1122 Type 2 diabetes mellitus with diabetic chronic kidney disease: Secondary | ICD-10-CM | POA: Diagnosis not present

## 2018-01-24 DIAGNOSIS — I428 Other cardiomyopathies: Secondary | ICD-10-CM | POA: Diagnosis not present

## 2018-01-24 DIAGNOSIS — R1013 Epigastric pain: Secondary | ICD-10-CM | POA: Diagnosis not present

## 2018-01-25 ENCOUNTER — Telehealth: Payer: Self-pay | Admitting: Cardiovascular Disease

## 2018-01-25 NOTE — Telephone Encounter (Signed)
° ° °  ° °  [facility] Pre-operative Risk Assessment    Request for surgical clearance:  1. What type of surgery is being performed? EGD, Flexsig  2. When is this surgery scheduled? 01/26/18  3. What type of clearance is required (medical clearance vs. Pharmacy clearance to hold med vs. Both)? BOTH  4. Are there any medications that need to be held prior to surgery and how long? n/a  5. Practice name and name of physician performing surgery?  Dr Tonny Branch, [facility] Compton  6. What is your office phone number [phone] or  [phone]   7.   What is your office fax number  [phone], or the [facility]  Fax [phone]  8.   Anesthesia type (None, local, MAC, general) ? [place] 01/25/2018, 4:47 PM  _________________________________________________________________   (provider comments below)

## 2018-01-25 NOTE — Telephone Encounter (Signed)
   Primary Cardiologist: Sanda Klein, MD/ Dr. Gwenlyn Found  Chart reviewed as part of pre-operative protocol coverage. Patient was contacted 01/25/2018 in reference to pre-operative risk assessment for pending surgery as outlined below.  Jordan Ross was last seen on 12/29/17  by Dr. Sallyanne Kuster.  Since that day, Jordan Ross has done well with no SOB or chest pain and no ICD shocks. We would prefer he remain on ASA but could stop if needed for 3 days.  Therefore, based on ACC/AHA guidelines, the patient would be at acceptable risk for the planned procedure without further cardiovascular testing.   I will route this recommendation to the requesting party via Epic fax function and remove from pre-op pool.  Please call with questions.  Cecilie Kicks, NP 01/25/2018, 5:08 PM

## 2018-01-26 DIAGNOSIS — K2951 Unspecified chronic gastritis with bleeding: Secondary | ICD-10-CM | POA: Diagnosis not present

## 2018-01-26 DIAGNOSIS — K219 Gastro-esophageal reflux disease without esophagitis: Secondary | ICD-10-CM | POA: Diagnosis not present

## 2018-01-26 DIAGNOSIS — D128 Benign neoplasm of rectum: Secondary | ICD-10-CM | POA: Diagnosis not present

## 2018-01-26 DIAGNOSIS — E785 Hyperlipidemia, unspecified: Secondary | ICD-10-CM | POA: Diagnosis not present

## 2018-01-26 DIAGNOSIS — K254 Chronic or unspecified gastric ulcer with hemorrhage: Secondary | ICD-10-CM | POA: Diagnosis not present

## 2018-01-26 DIAGNOSIS — I428 Other cardiomyopathies: Secondary | ICD-10-CM | POA: Diagnosis not present

## 2018-01-26 DIAGNOSIS — Z794 Long term (current) use of insulin: Secondary | ICD-10-CM | POA: Diagnosis not present

## 2018-01-26 DIAGNOSIS — K621 Rectal polyp: Secondary | ICD-10-CM | POA: Diagnosis not present

## 2018-01-26 DIAGNOSIS — K921 Melena: Secondary | ICD-10-CM | POA: Diagnosis not present

## 2018-01-26 DIAGNOSIS — I509 Heart failure, unspecified: Secondary | ICD-10-CM | POA: Diagnosis not present

## 2018-01-26 DIAGNOSIS — I2510 Atherosclerotic heart disease of native coronary artery without angina pectoris: Secondary | ICD-10-CM | POA: Diagnosis not present

## 2018-01-26 DIAGNOSIS — R1013 Epigastric pain: Secondary | ICD-10-CM | POA: Diagnosis not present

## 2018-01-26 DIAGNOSIS — Z95810 Presence of automatic (implantable) cardiac defibrillator: Secondary | ICD-10-CM | POA: Diagnosis not present

## 2018-01-26 DIAGNOSIS — K2970 Gastritis, unspecified, without bleeding: Secondary | ICD-10-CM | POA: Diagnosis not present

## 2018-01-26 DIAGNOSIS — K259 Gastric ulcer, unspecified as acute or chronic, without hemorrhage or perforation: Secondary | ICD-10-CM | POA: Diagnosis not present

## 2018-01-26 DIAGNOSIS — K449 Diaphragmatic hernia without obstruction or gangrene: Secondary | ICD-10-CM | POA: Diagnosis not present

## 2018-01-26 DIAGNOSIS — K635 Polyp of colon: Secondary | ICD-10-CM | POA: Diagnosis not present

## 2018-01-26 DIAGNOSIS — E1122 Type 2 diabetes mellitus with diabetic chronic kidney disease: Secondary | ICD-10-CM | POA: Diagnosis not present

## 2018-01-26 DIAGNOSIS — N189 Chronic kidney disease, unspecified: Secondary | ICD-10-CM | POA: Diagnosis not present

## 2018-01-26 DIAGNOSIS — Z87891 Personal history of nicotine dependence: Secondary | ICD-10-CM | POA: Diagnosis not present

## 2018-01-26 DIAGNOSIS — I130 Hypertensive heart and chronic kidney disease with heart failure and stage 1 through stage 4 chronic kidney disease, or unspecified chronic kidney disease: Secondary | ICD-10-CM | POA: Diagnosis not present

## 2018-01-26 DIAGNOSIS — K922 Gastrointestinal hemorrhage, unspecified: Secondary | ICD-10-CM | POA: Diagnosis not present

## 2018-01-26 DIAGNOSIS — K2981 Duodenitis with bleeding: Secondary | ICD-10-CM | POA: Diagnosis not present

## 2018-01-26 DIAGNOSIS — R112 Nausea with vomiting, unspecified: Secondary | ICD-10-CM | POA: Diagnosis not present

## 2018-01-26 DIAGNOSIS — I252 Old myocardial infarction: Secondary | ICD-10-CM | POA: Diagnosis not present

## 2018-02-01 ENCOUNTER — Encounter: Payer: Self-pay | Admitting: Family

## 2018-02-01 ENCOUNTER — Ambulatory Visit: Payer: Medicare Other | Admitting: Family

## 2018-02-01 ENCOUNTER — Ambulatory Visit (INDEPENDENT_AMBULATORY_CARE_PROVIDER_SITE_OTHER): Payer: Medicare Other | Admitting: Family

## 2018-02-01 ENCOUNTER — Ambulatory Visit (HOSPITAL_COMMUNITY)
Admission: RE | Admit: 2018-02-01 | Discharge: 2018-02-01 | Disposition: A | Discharge status: Home / Self Care | Payer: Medicare Other | Source: Ambulatory Visit | Attending: Family | Admitting: Family

## 2018-02-01 ENCOUNTER — Other Ambulatory Visit (HOSPITAL_COMMUNITY): Payer: Medicare Other

## 2018-02-01 VITALS — BP 134/84 | HR 71 | Temp 97.7°F | Ht 72.0 in | Wt 182.6 lb

## 2018-02-01 DIAGNOSIS — Z95828 Presence of other vascular implants and grafts: Secondary | ICD-10-CM

## 2018-02-01 DIAGNOSIS — I255 Ischemic cardiomyopathy: Secondary | ICD-10-CM

## 2018-02-01 DIAGNOSIS — I714 Abdominal aortic aneurysm, without rupture, unspecified: Secondary | ICD-10-CM

## 2018-02-01 DIAGNOSIS — Z87891 Personal history of nicotine dependence: Secondary | ICD-10-CM

## 2018-02-01 NOTE — Patient Instructions (Signed)
Before your next abdominal ultrasound:  Avoid gas forming foods and beverages the day before the test.   Take two Extra-Strength Gas-X capsules at bedtime the night before the test. Take another two Extra-Strength Gas-X capsules in the middle of the night if you get up to the restroom, if not, first thing in the morning with water.  Do not chew gum.     

## 2018-02-01 NOTE — Progress Notes (Signed)
[facility] [place]  CC: Follow up s/p Endovascular Repair of Abdominal Aortic Aneurysm    History of Present Illness  Jordan Ross is a 72 y.o. (1945/06/13) male who returns today for follow-up. On 05-23-2015, he underwent endovascular repair of a 5 cm infrarenal abdominal aortic aneurysm by Dr. Trula Slade. His postoperative course was uncomplicated. His initial CT scan showed no evidence of endoleak.   He fell off of a ladder in mid 2017, and suffered fractures to his left shoulder and left foot, was treated in the ED at that time; states his shoulder and foot have mild residual pain.  He denies claudication type sx's with walking, denies any hx of stroke or TIA.   Dr. Trula Slade last evaluated pt on 01-11-16. At that time carotid Duplex demonstrated <40% bilateral stenosis. AAA:No evidence of endoleak. The aneurysm had decreased in size with a maximum diameter of 4.4 cm. Carotid: No further follow up needed as arteries were normal on duplex. AAA: S/p EVAR without complication. Aneurysm continues to decrease in size without evidence of endoleak. Follow-up 1 year with repeat ultrasound.  Pt denies back or abdominal pain.   Pt Diabetic: Yes, last A1C result on file was 8.6 on 05-19-17 Pt smoker: former smoker, quit in 2001, started smoking at age 48 years    Past Medical History:  Diagnosis Date  . Abdominal aortic aneurysm ([place])   . AICD (automatic cardioverter/defibrillator) present    St Jude  . Anemia    iron deficiency anemia,takes iron pill daily  . Arthritis   . Coronary artery disease    AWMI 2001  . Diverticulosis   . GERD (gastroesophageal reflux disease)    takes Protonix daily  . History of colon polyps    benign  . History of migraine 40 yrs ago  . History of shingles   . Hyperlipidemia    takes Atorvastatin and Niacin daily  . Hypertension    takes Diovan,Spironolactone, and Carvedilol daily  . ICD (implantable  cardioverter-defibrillator), single, in situ   . Ischemic cardiomyopathy   . Joint swelling   . LV dysfunction    takes Digoxin daily  . Myocardial infarction ([place]) 2001  . Pneumonia 40 yrs ago   hx of  . Shortness of breath dyspnea    with exertion.States he was a smoker for yr  . Status post coronary artery bypass grafting   . Type 2 diabetes mellitus (HCC)    takes Glipizide,Metformin,Invokana,and Lantus daily.Average fasting blood sugar runs about 140   Past Surgical History:  Procedure Laterality Date  . ABDOMINAL AORTIC ENDOVASCULAR STENT GRAFT N/A 05/22/2015   Procedure: ABDOMINAL AORTIC ENDOVASCULAR STENT GRAFT;  Surgeon: Serafina Mitchell, MD;  Location: [facility];  Service: Vascular;  Laterality: N/A;  . CARDIAC CATHETERIZATION  2001  . COLONOSCOPY N/A 03/21/2014   Procedure: COLONOSCOPY;  Surgeon: Rogene Houston, MD;  [facility];  Service: Endoscopy;  Laterality: N/A;  855 - moved to 12/4 @ 8:30 - Ann notified pt  . CORONARY ARTERY BYPASS GRAFT  2001   unsure if it was 3 or 4  . DOPPLER ECHOCARDIOGRAPHY     2011 38% EF.2013 EF 30-35%   Social History Social History   Tobacco Use  . Smoking status: Former Smoker    Years: 40.00  . Smokeless tobacco: Never Used  . Tobacco comment: quit smoking in 2001  Substance Use Topics  . Alcohol use: No  . Drug use: No   Family  History Family History  Problem Relation Age of Onset  . COPD Mother   . Heart disease Brother    Current Outpatient Medications on File Prior to Visit  Medication Sig Dispense Refill  . aspirin 81 MG tablet Take 81 mg by mouth daily.     [name] atorvastatin (LIPITOR) 40 MG tablet Take 40 mg by mouth daily.    . carvedilol (COREG) 12.5 MG tablet Take 12.5 mg by mouth 2 (two) times daily.    . digoxin (LANOXIN) 0.125 MG tablet Take 125 mcg by mouth daily.    [name] FORA LANCETS MISC Test bid as directed. E11.65. 100 each 5  . glucose blood (FORA V10 BLOOD GLUCOSE TEST) test strip Use as instructed  bid. E11.65 100 each 5  . glucose blood test strip Use as instructed 100 each 2  . Insulin Glargine (TOUJEO SOLOSTAR) 300 UNIT/ML SOPN Inject 70 Units into the skin at bedtime. 6 pen 2  . IRON PO Take 65 mg by mouth daily.    [name] LANCETS ULTRA THIN MISC Test BG every morning 100 each 5  . LITETOUCH PEN NEEDLES 31G X 8 MM MISC USE AT BEDTIME AS DIRECTED. 50 each 5  . metFORMIN (GLUCOPHAGE) 500 MG tablet Take 1,000 mg by mouth 2 (two) times daily with a meal.     . niacin (NIASPAN) 1000 MG CR tablet Take 1 tablet (1,000 mg total) by mouth daily. 30 tablet 3  . pantoprazole (PROTONIX) 40 MG tablet Take 40 mg by mouth daily.    [name] spironolactone (ALDACTONE) 25 MG tablet Take 25 mg by mouth daily.    . valsartan (DIOVAN) 160 MG tablet Take 160 mg by mouth daily.     No current facility-administered medications on file prior to visit.    No Known Allergies   ROS: See HPI for pertinent positives and negatives.  Physical Examination  Vitals:   02/01/18 0831  BP: 134/84  Pulse: 71  Temp: 97.7 F (36.5 C)  TempSrc: Oral  SpO2: 99%  Weight: 182 lb 9.6 oz (82.8 kg)  Height: 6' (1.829 m)   Body mass index is 24.77 kg/m.  General: A&O x 3, WD, male HEENT: No gross abnormalities  Pulmonary: Sym exp, respirations are non labored, good air movement in all fields CTAB, no rales, rhonchi, or wheezes.  Cardiac: Regular rhythm and rate, no murmur appreciated  Vascular: Vessel Right Left  Radial 2+Palpable 2+Palpable  Carotid  without bruit  without bruit  Aorta Not palpable N/A  Femoral 2+Palpable 2+Palpable  Popliteal 2+ palpable Not palpable  PT 1+Palpable 1+Palpable  DP 1+Palpable Not Palpable   Gastrointestinal: soft, NTND, -G/R, - HSM, - palpable masses, - CVAT B. Musculoskeletal: M/S 5/5 throughout, extremities without ischemic changes. Tip of right index finger is surgically absent from a remote crush injury. Skin: No rashes, no ulcers, no cellulitis.   Neurologic: Pain and light  touch intact in extremities, Motor exam as listed above.CN 2-12 intact except has some hearing loss.  Psychiatric: Normal thought content, mood appropriate for clinical situation.    DATA  EVAR Duplex   Previous (Date: 01-16-17)  AAA sac size: 4.06 cm x 4.17 cm; Iliac arteries were difficult to visualize; Right CIA: 1.27 CM; Left CIA: 1.2 cm no endoleak detected  Current (Date: 02-01-18)  AAA sac size: 4.17 cm; Right CIA: 1.41 cm; Left CIA: 1.51 cm  no endoleak detected   Medical Decision Making  Jordan Ross is a 72 y.o. male who presents  s/p EVAR (Date: 05-23-2015).  Pt is asymptomatic with stable sac size at 4.17 cm.  I discussed with the patient the importance of surveillance of the endograft.  The next endograft duplex will be scheduled for 12 months.  The patient will follow up with [place] in 12 months with these studies.  I emphasized the importance of maximal medical management including strict control of blood pressure, blood glucose, and lipid levels, antiplatelet agents, obtaining regular exercise, and cessation of smoking.   Thank you for allowing [place] to participate in this patient's care.  Clemon Chambers, RN, MSN, FNP-C [facility] [place] Office: [facility] Physician: Oneida Alar  02/01/2018, 8:43 AM

## 2018-02-05 ENCOUNTER — Encounter (HOSPITAL_COMMUNITY): Payer: Self-pay

## 2018-02-05 ENCOUNTER — Ambulatory Visit (HOSPITAL_COMMUNITY): Payer: Medicare Other

## 2018-02-16 DIAGNOSIS — K279 Peptic ulcer, site unspecified, unspecified as acute or chronic, without hemorrhage or perforation: Secondary | ICD-10-CM | POA: Diagnosis not present

## 2018-02-16 DIAGNOSIS — K5900 Constipation, unspecified: Secondary | ICD-10-CM | POA: Diagnosis not present

## 2018-02-16 DIAGNOSIS — I428 Other cardiomyopathies: Secondary | ICD-10-CM | POA: Diagnosis not present

## 2018-02-16 DIAGNOSIS — K921 Melena: Secondary | ICD-10-CM | POA: Diagnosis not present

## 2018-02-16 DIAGNOSIS — R1110 Vomiting, unspecified: Secondary | ICD-10-CM | POA: Diagnosis not present

## 2018-02-16 DIAGNOSIS — Z95810 Presence of automatic (implantable) cardiac defibrillator: Secondary | ICD-10-CM | POA: Diagnosis not present

## 2018-02-16 DIAGNOSIS — R1013 Epigastric pain: Secondary | ICD-10-CM | POA: Diagnosis not present

## 2018-02-16 DIAGNOSIS — R634 Abnormal weight loss: Secondary | ICD-10-CM | POA: Diagnosis not present

## 2018-02-16 DIAGNOSIS — Z1211 Encounter for screening for malignant neoplasm of colon: Secondary | ICD-10-CM | POA: Diagnosis not present

## 2018-03-01 DIAGNOSIS — K921 Melena: Secondary | ICD-10-CM | POA: Diagnosis not present

## 2018-03-01 DIAGNOSIS — R1110 Vomiting, unspecified: Secondary | ICD-10-CM | POA: Diagnosis not present

## 2018-03-01 DIAGNOSIS — R1013 Epigastric pain: Secondary | ICD-10-CM | POA: Diagnosis not present

## 2018-03-19 ENCOUNTER — Ambulatory Visit (HOSPITAL_COMMUNITY)
Admission: RE | Admit: 2018-03-19 | Discharge: 2018-03-19 | Disposition: A | Discharge status: Home / Self Care | Payer: Medicare Other | Source: Ambulatory Visit | Attending: Internal Medicine | Admitting: Internal Medicine

## 2018-03-19 ENCOUNTER — Other Ambulatory Visit (HOSPITAL_COMMUNITY): Payer: Self-pay | Admitting: Internal Medicine

## 2018-03-19 DIAGNOSIS — R0600 Dyspnea, unspecified: Secondary | ICD-10-CM

## 2018-03-19 DIAGNOSIS — Z0001 Encounter for general adult medical examination with abnormal findings: Secondary | ICD-10-CM | POA: Diagnosis not present

## 2018-03-19 DIAGNOSIS — Z1389 Encounter for screening for other disorder: Secondary | ICD-10-CM | POA: Diagnosis not present

## 2018-03-19 DIAGNOSIS — K219 Gastro-esophageal reflux disease without esophagitis: Secondary | ICD-10-CM | POA: Diagnosis not present

## 2018-03-19 DIAGNOSIS — Z125 Encounter for screening for malignant neoplasm of prostate: Secondary | ICD-10-CM | POA: Diagnosis not present

## 2018-03-19 DIAGNOSIS — J309 Allergic rhinitis, unspecified: Secondary | ICD-10-CM | POA: Diagnosis not present

## 2018-03-19 DIAGNOSIS — E782 Mixed hyperlipidemia: Secondary | ICD-10-CM | POA: Diagnosis not present

## 2018-03-19 DIAGNOSIS — I2510 Atherosclerotic heart disease of native coronary artery without angina pectoris: Secondary | ICD-10-CM | POA: Diagnosis not present

## 2018-03-19 DIAGNOSIS — E1122 Type 2 diabetes mellitus with diabetic chronic kidney disease: Secondary | ICD-10-CM | POA: Diagnosis not present

## 2018-03-19 DIAGNOSIS — R946 Abnormal results of thyroid function studies: Secondary | ICD-10-CM | POA: Diagnosis not present

## 2018-03-19 DIAGNOSIS — E663 Overweight: Secondary | ICD-10-CM | POA: Diagnosis not present

## 2018-03-19 DIAGNOSIS — R0602 Shortness of breath: Secondary | ICD-10-CM | POA: Diagnosis not present

## 2018-03-19 DIAGNOSIS — G894 Chronic pain syndrome: Secondary | ICD-10-CM | POA: Diagnosis not present

## 2018-03-19 DIAGNOSIS — Z6825 Body mass index (BMI) 25.0-25.9, adult: Secondary | ICD-10-CM | POA: Diagnosis not present

## 2018-03-19 DIAGNOSIS — E291 Testicular hypofunction: Secondary | ICD-10-CM | POA: Diagnosis not present

## 2018-03-19 DIAGNOSIS — I10 Essential (primary) hypertension: Secondary | ICD-10-CM | POA: Diagnosis not present

## 2018-03-26 ENCOUNTER — Ambulatory Visit: Payer: Medicare Other

## 2018-03-26 DIAGNOSIS — I255 Ischemic cardiomyopathy: Secondary | ICD-10-CM | POA: Diagnosis not present

## 2018-03-28 ENCOUNTER — Ambulatory Visit (INDEPENDENT_AMBULATORY_CARE_PROVIDER_SITE_OTHER): Payer: Medicare Other | Admitting: Cardiovascular Disease

## 2018-03-28 ENCOUNTER — Encounter: Payer: Self-pay | Admitting: Cardiovascular Disease

## 2018-03-28 VITALS — BP 144/98 | HR 95 | Ht 72.0 in | Wt 179.4 lb

## 2018-03-28 DIAGNOSIS — I714 Abdominal aortic aneurysm, without rupture, unspecified: Secondary | ICD-10-CM

## 2018-03-28 DIAGNOSIS — Z95810 Presence of automatic (implantable) cardiac defibrillator: Secondary | ICD-10-CM

## 2018-03-28 DIAGNOSIS — E785 Hyperlipidemia, unspecified: Secondary | ICD-10-CM

## 2018-03-28 DIAGNOSIS — I2510 Atherosclerotic heart disease of native coronary artery without angina pectoris: Secondary | ICD-10-CM

## 2018-03-28 DIAGNOSIS — I10 Essential (primary) hypertension: Secondary | ICD-10-CM

## 2018-03-28 DIAGNOSIS — I5022 Chronic systolic (congestive) heart failure: Secondary | ICD-10-CM | POA: Diagnosis not present

## 2018-03-28 DIAGNOSIS — I255 Ischemic cardiomyopathy: Secondary | ICD-10-CM | POA: Diagnosis not present

## 2018-03-28 DIAGNOSIS — E119 Type 2 diabetes mellitus without complications: Secondary | ICD-10-CM | POA: Diagnosis not present

## 2018-03-28 DIAGNOSIS — Z79899 Other long term (current) drug therapy: Secondary | ICD-10-CM | POA: Diagnosis not present

## 2018-03-28 NOTE — Progress Notes (Signed)
Cardiology Office Note    Date:  03/28/2018   ID:  ANURAG SCARFO, DOB 1945-07-26, MRN [id number]  PCP:  Redmond School, MD  Cardiologist:  Quay Burow, M.D. Sanda Klein, MD   Chief Complaint  Patient presents with  . Follow-up    CHF    History of Present Illness:  RAZI HICKLE is a 72 y.o. male for defibrillator follow-up. He last saw Dr. Gwenlyn Found for general cardiology follow-up in May 2019 and had post [facility] [place] evaluation (Dr. Trula Slade) in October 2018.  He is a Actor.  He has been losing some weight since he is watching his diet.  He went to see his primary care provider last week with complaints of shortness of breath on exertion and his chest x-ray did suggest pulmonary venous congestion.  He was started on diuretics and has shown improvement.  He was prescribed 2 furosemide tablets (dose uncertain) for 5 days and then to take just 1 daily.  He is already on spironolactone, valsartan and carvedilol.  He is feeling better.  He does not have orthopnea, PND never had leg edema.  Interrogation of his single-chamber St. Jude fortify defibrillator today does show a dip in thoracic impedance consistent with volume overload throughout most of November, rapidly improving just over the last few days.  His thoracic impedance has always been rather volatile and has not always correlated well with his clinical status.  Estimated device longevity is over 3 years and lead parameters are excellent.  He has not had VT/VF.  He presented with an anterior wall myocardial infarction in 2001 treated with coronary artery bypass grafting by Dr. Roxy Manns (LIMA to his LAD, SVGs to a ramus branch, circumflex and PDA). His EF was in the 35% range. He underwent ICD implantation (single-chamber St. Jude Fortify) for primary prevention October 2010. He has not received either appropriate or inappropriate shocks. His other problems include a known abdominal aortic aneurysm s/p EVAR 05/2015, bilateral  renal artery stenosis, hypertension, hyperlipidemia and non-insulin-requiring diabetes. He has chronic shortness of breath, likely related to a history of tobacco abuse, having smoked 40-pack-years, quit 2001 at the time of his open heart surgery. The stress Myoview performed 04/07/11 showed anterior scar without ischemia.  Past Medical History:  Diagnosis Date  . Abdominal aortic aneurysm ([place])   . AICD (automatic cardioverter/defibrillator) present    St Jude  . Anemia    iron deficiency anemia,takes iron pill daily  . Arthritis   . Coronary artery disease    AWMI 2001  . Diverticulosis   . GERD (gastroesophageal reflux disease)    takes Protonix daily  . History of colon polyps    benign  . History of migraine 40 yrs ago  . History of shingles   . Hyperlipidemia    takes Atorvastatin and Niacin daily  . Hypertension    takes Diovan,Spironolactone, and Carvedilol daily  . ICD (implantable cardioverter-defibrillator), single, in situ   . Ischemic cardiomyopathy   . Joint swelling   . LV dysfunction    takes Digoxin daily  . Myocardial infarction ([place]) 2001  . Pneumonia 40 yrs ago   hx of  . Shortness of breath dyspnea    with exertion.States he was a smoker for yr  . Status post coronary artery bypass grafting   . Type 2 diabetes mellitus (HCC)    takes Glipizide,Metformin,Invokana,and Lantus daily.Average fasting blood sugar runs about 140    Past Surgical History:  Procedure Laterality Date  .  ABDOMINAL AORTIC ENDOVASCULAR STENT GRAFT N/A 05/22/2015   Procedure: ABDOMINAL AORTIC ENDOVASCULAR STENT GRAFT;  Surgeon: Serafina Mitchell, MD;  Location: [facility];  Service: Vascular;  Laterality: N/A;  . CARDIAC CATHETERIZATION  2001  . COLONOSCOPY N/A 03/21/2014   Procedure: COLONOSCOPY;  Surgeon: Rogene Houston, MD;  [facility];  Service: Endoscopy;  Laterality: N/A;  855 - moved to 12/4 @ 8:30 - Ann notified pt  . CORONARY ARTERY BYPASS GRAFT  2001   unsure if it  was 3 or 4  . DOPPLER ECHOCARDIOGRAPHY     2011 38% EF.2013 EF 30-35%    Current Medications: Outpatient Medications Prior to Visit  Medication Sig Dispense Refill  . aspirin 81 MG tablet Take 81 mg by mouth daily.     [name] atorvastatin (LIPITOR) 40 MG tablet Take 40 mg by mouth daily.    . carvedilol (COREG) 12.5 MG tablet Take 12.5 mg by mouth 2 (two) times daily.    . digoxin (LANOXIN) 0.125 MG tablet Take 125 mcg by mouth daily.    [name] FORA LANCETS MISC Test bid as directed. E11.65. 100 each 5  . glucose blood (FORA V10 BLOOD GLUCOSE TEST) test strip Use as instructed bid. E11.65 100 each 5  . glucose blood test strip Use as instructed 100 each 2  . Insulin Glargine (TOUJEO SOLOSTAR) 300 UNIT/ML SOPN Inject 70 Units into the skin at bedtime. 6 pen 2  . IRON PO Take 65 mg by mouth daily.    [name] LANCETS ULTRA THIN MISC Test BG every morning 100 each 5  . LITETOUCH PEN NEEDLES 31G X 8 MM MISC USE AT BEDTIME AS DIRECTED. 50 each 5  . metFORMIN (GLUCOPHAGE) 500 MG tablet Take 1,000 mg by mouth 2 (two) times daily with a meal.     . niacin (NIASPAN) 1000 MG CR tablet Take 1 tablet (1,000 mg total) by mouth daily. 30 tablet 3  . pantoprazole (PROTONIX) 40 MG tablet Take 40 mg by mouth daily.    [name] spironolactone (ALDACTONE) 25 MG tablet Take 25 mg by mouth daily.    . valsartan (DIOVAN) 160 MG tablet Take 160 mg by mouth daily.     No facility-administered medications prior to visit.      Allergies:   Patient has no known allergies.   Social History   Socioeconomic History  . Marital status: Widowed    Spouse name: Not on file  . Number of children: Not on file  . Years of education: Not on file  . Highest education level: Not on file  Occupational History  . Not on file  Social Needs  . Financial resource strain: Not on file  . Food insecurity:    Worry: Not on file    Inability: Not on file  . Transportation needs:    Medical: Not on file    Non-medical: Not on file  Tobacco  Use  . Smoking status: Former Smoker    Years: 40.00  . Smokeless tobacco: Never Used  . Tobacco comment: quit smoking in 2001  Substance and Sexual Activity  . Alcohol use: No  . Drug use: No  . Sexual activity: Yes  Lifestyle  . Physical activity:    Days per week: Not on file    Minutes per session: Not on file  . Stress: Not on file  Relationships  . Social connections:    Talks on phone: Not on file    Gets together: Not on  file    Attends religious service: Not on file    Active member of club or organization: Not on file    Attends meetings of clubs or organizations: Not on file    Relationship status: Not on file  Other Topics Concern  . Not on file  Social History Narrative  . Not on file     Family History:  The patient's family history includes COPD in his mother; Heart disease in his brother.   ROS:   Please see the history of present illness.    ROS all other systems are reviewed and are negative PHYSICAL EXAM:   VS:  BP (!) 144/98   Pulse 95   Ht 6' (1.829 m)   Wt 179 lb 6.4 oz (81.4 kg)   BMI 24.33 kg/m    Recheck blood pressure 133/90 mmHg  General: Alert, oriented x3, no distress, healthy left subclavian defibrillator site Head: no evidence of trauma, PERRL, EOMI, no exophtalmos or lid lag, no myxedema, no xanthelasma; normal ears, nose and oropharynx Neck: normal jugular venous pulsations and no hepatojugular reflux; brisk carotid pulses without delay and no carotid bruits Chest: clear to auscultation, no signs of consolidation by percussion or palpation, normal fremitus, symmetrical and full respiratory excursions Cardiovascular: normal position and quality of the apical impulse, regular rhythm, normal first and second heart sounds, no murmurs, rubs or gallops Abdomen: no tenderness or distention, no masses by palpation, no abnormal pulsatility or arterial bruits, normal bowel sounds, no hepatosplenomegaly Extremities: no clubbing, cyanosis or edema;  2+ radial, ulnar and brachial pulses bilaterally; 2+ right femoral, posterior tibial and dorsalis pedis pulses; 2+ left femoral, posterior tibial and dorsalis pedis pulses; no subclavian or femoral bruits Neurological: grossly nonfocal Psych: Normal mood and affect   Wt Readings from Last 3 Encounters:  03/28/18 179 lb 6.4 oz (81.4 kg)  02/01/18 182 lb 9.6 oz (82.8 kg)  12/29/17 185 lb 3.2 oz (84 kg)    Studies/Labs Reviewed:   EKG:  EKG is not ordered today.    Recent Labs: 05/19/2017: ALT 14; BUN 10; Creat 1.07; Potassium 4.9; Sodium 139   Lipid Panel    Component Value Date/Time   CHOL 126 12/23/2016 0943   TRIG 136 12/23/2016 0943   HDL 41 12/23/2016 0943   CHOLHDL 3.1 12/23/2016 0943   CHOLHDL 7.8 (H) 06/14/2016 1458   VLDL NOT CALC 06/14/2016 1458   LDLCALC 58 12/23/2016 0943   May 20, 2017 labs Total cholesterol 121, HDL 36, LDL 65, triglycerides 99 Hemoglobin A1c 8.6% Creatinine 1.07, potassium 4.9, normal LFTs, TSH 2.27  ASSESSMENT:    1. Chronic systolic heart failure ([place])   2. Coronary artery disease involving native coronary artery of native heart without angina pectoris   3. ICD (implantable cardioverter-defibrillator), single, in situ   4. AAA (abdominal aortic aneurysm) without rupture ([place])   5. Essential hypertension   6. Dyslipidemia (high LDL; low HDL)   7. Diabetes mellitus type 2 in nonobese (HCC)   8. Medication management     PLAN:  In order of problems listed above:  1. CHF: For the first time in a very long time he has had symptoms of volume overload.  He has lost weight and weighs less than he did in September when he appeared to be euvolemic.  Need to reestablish "dry weight".  Agree with the current recommended diuretics.  He takes spironolactone and ARB and carvedilol.  Recheck metabolic panel next week on the new medical  regimen. 2. CAD s/p CABG: Asymptomatic.  He does not have angina pectoris. 3. ICD: Normal device function, remote  downloads every 3 months and office visit yearly.   4. AAA s/p EVAR and ascending aortic aneurysm: followed by Dr. Trula Slade.  Last AAA ultrasound October 2019, is unchanged from the one performed a year earlier (maximum diameter 4.17 cm, no evidence of endoleak).  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  Bovine arch.   5. HTN: His blood pressure remains elevated.  When he checks no evidence of about his labs next week we will ask him to record his blood pressure and plan to increase the dose of valsartan or carvedilol. 6. HLP: Lipid profile is acceptable although would like to help boost his HDL cholesterol.  He is relatively lean.  The answer may be in improved diabetes control. 7. DM: Control has deteriorated since last year.  He has lost some weight and I suspect his glucose control to be improving.    Medication Adjustments/Labs and Tests Ordered: Current medicines are reviewed at length with the patient today.  Concerns regarding medicines are outlined above.  Medication changes, Labs and Tests ordered today are listed in the Patient Instructions below. Patient Instructions  Medication Instructions:  Dr Sallyanne Kuster recommends that you continue on your current medications as directed. Please refer to the Current Medication list given to you today.  If you need a refill on your cardiac medications before your next appointment, please call your pharmacy.   Testing/Procedures: Remote monitoring is used to monitor your Pacemaker of ICD from home. This monitoring reduces the number of office visits required to check your device to one time per year. It allows [place] to keep an eye on the functioning of your device to ensure it is working properly. You are scheduled for a device check from home on Wednesday, March 11th, 2020. You may send your transmission at any time that day. If you have a wireless device, the transmission will be  sent automatically. After your physician reviews your transmission, you will receive a postcard with your next transmission date. To improve our patient care and to more adequately follow your device, [facility] has decided, as a practice, to start following each patient four times a year with your home monitor. This means that you may experience a remote appointment that is close to an in-office appointment with your physician. Your insurance will apply at the same rate as other remote monitoring transmissions.  Follow-Up: At [facility], you and your health needs are our priority.  As part of our continuing mission to provide you with exceptional heart care, we have created designated Provider Care Teams.  These Care Teams include your primary Cardiologist (physician) and Advanced Practice Providers (APPs -  Physician Assistants and Nurse Practitioners) who all work together to provide you with the care you need, when you need it. You will need a follow up appointment in 9 months. You may see Sanda Klein, MD or one of the following Advanced Practice Providers on your designated Care Team: [place], [place] . Fabian Sharp, PA-C . You will receive a reminder letter in the mail two months in advance. If you don't receive a letter, please call our office to schedule the follow-up appointment.    Signed, Sanda Klein, MD  03/28/2018 3:24 PM    [place], [place]  [address] Phone: [phone]; Fax: [phone]

## 2018-03-28 NOTE — Progress Notes (Signed)
Remote ICD transmission.   

## 2018-03-28 NOTE — Patient Instructions (Signed)
Medication Instructions:  Dr Sallyanne Kuster recommends that you continue on your current medications as directed. Please refer to the Current Medication list given to you today.  If you need a refill on your cardiac medications before your next appointment, please call your pharmacy.   Testing/Procedures: Remote monitoring is used to monitor your Pacemaker of ICD from home. This monitoring reduces the number of office visits required to check your device to one time per year. It allows [place] to keep an eye on the functioning of your device to ensure it is working properly. You are scheduled for a device check from home on Wednesday, March 11th, 2020. You may send your transmission at any time that day. If you have a wireless device, the transmission will be sent automatically. After your physician reviews your transmission, you will receive a postcard with your next transmission date. To improve our patient care and to more adequately follow your device, [facility] has decided, as a practice, to start following each patient four times a year with your home monitor. This means that you may experience a remote appointment that is close to an in-office appointment with your physician. Your insurance will apply at the same rate as other remote monitoring transmissions.  Follow-Up: At [facility], you and your health needs are our priority.  As part of our continuing mission to provide you with exceptional heart care, we have created designated Provider Care Teams.  These Care Teams include your primary Cardiologist (physician) and Advanced Practice Providers (APPs -  Physician Assistants and Nurse Practitioners) who all work together to provide you with the care you need, when you need it. You will need a follow up appointment in 9 months. You may see Sanda Klein, MD or one of the following Advanced Practice Providers on your designated Care Team: [place], [place] . Fabian Sharp, PA-C . You will receive a  reminder letter in the mail two months in advance. If you don't receive a letter, please call our office to schedule the follow-up appointment.

## 2018-04-03 DIAGNOSIS — I5022 Chronic systolic (congestive) heart failure: Secondary | ICD-10-CM | POA: Diagnosis not present

## 2018-04-03 DIAGNOSIS — Z79899 Other long term (current) drug therapy: Secondary | ICD-10-CM | POA: Diagnosis not present

## 2018-04-04 LAB — BASIC METABOLIC PANEL
BUN/Creatinine Ratio: 12 (ref 10–24)
BUN: 13 mg/dL (ref 8–27)
CALCIUM: 10.4 mg/dL — AB (ref 8.6–10.2)
CHLORIDE: 98 mmol/L (ref 96–106)
CO2: 23 mmol/L (ref 20–29)
CREATININE: 1.12 mg/dL (ref 0.76–1.27)
GFR calc non Af Amer: 66 mL/min/{1.73_m2} (ref >59)
GFR, EST AFRICAN AMERICAN: 77 mL/min/{1.73_m2} (ref >59)
Glucose: 217 mg/dL — ABNORMAL HIGH (ref 65–99)
Potassium: 4.5 mmol/L (ref 3.5–5.2)
Sodium: 141 mmol/L (ref 134–144)

## 2018-04-04 LAB — PRO B NATRIURETIC PEPTIDE: NT-PRO BNP: 1531 pg/mL — AB (ref 0–376)

## 2018-05-12 LAB — CUP PACEART REMOTE DEVICE CHECK
Battery Remaining Longevity: 38 mo
Brady Statistic RV Percent Paced: 1 %
Date Time Interrogation Session: 20191209080002
HighPow Impedance: 48 Ohm
Implantable Lead Implant Date: 20101028
Implantable Lead Location: 753860
Implantable Pulse Generator Implant Date: 20101028
Lead Channel Impedance Value: 450 Ohm
Lead Channel Pacing Threshold Amplitude: 0.5 V
Lead Channel Pacing Threshold Pulse Width: 0.4 ms
Lead Channel Sensing Intrinsic Amplitude: 12 mV
Lead Channel Setting Pacing Pulse Width: 0.4 ms
MDC IDC MSMT BATTERY REMAINING PERCENTAGE: 32 %
MDC IDC MSMT BATTERY VOLTAGE: 2.86 V
MDC IDC SET LEADCHNL RV PACING AMPLITUDE: 2.5 V
MDC IDC SET LEADCHNL RV SENSING SENSITIVITY: 0.5 mV
Pulse Gen Serial Number: 737526

## 2018-05-16 LAB — CUP PACEART INCLINIC DEVICE CHECK
Implantable Lead Implant Date: 20101028
Implantable Lead Location: 753860
MDC IDC PG IMPLANT DT: 20101028
MDC IDC PG SERIAL: 737526
MDC IDC SESS DTM: 20200129155652

## 2018-06-27 ENCOUNTER — Ambulatory Visit (INDEPENDENT_AMBULATORY_CARE_PROVIDER_SITE_OTHER): Payer: Medicare Other | Admitting: *Deleted

## 2018-06-27 DIAGNOSIS — I5022 Chronic systolic (congestive) heart failure: Secondary | ICD-10-CM | POA: Diagnosis not present

## 2018-06-27 DIAGNOSIS — I255 Ischemic cardiomyopathy: Secondary | ICD-10-CM

## 2018-06-28 LAB — CUP PACEART REMOTE DEVICE CHECK
Battery Remaining Longevity: 35 mo
Battery Remaining Percentage: 30 %
Battery Voltage: 2.86 V
Brady Statistic RV Percent Paced: 1 %
Date Time Interrogation Session: 20200309082756
HighPow Impedance: 51 Ohm
Implantable Lead Location: 753860
Implantable Pulse Generator Implant Date: 20101028
Lead Channel Impedance Value: 490 Ohm
Lead Channel Pacing Threshold Amplitude: 0.75 V
Lead Channel Pacing Threshold Pulse Width: 0.4 ms
Lead Channel Sensing Intrinsic Amplitude: 12 mV
Lead Channel Setting Pacing Amplitude: 2.5 V
Lead Channel Setting Pacing Pulse Width: 0.4 ms
Lead Channel Setting Sensing Sensitivity: 0.5 mV
MDC IDC LEAD IMPLANT DT: 20101028
Pulse Gen Serial Number: 737526

## 2018-07-04 NOTE — Progress Notes (Signed)
Remote ICD transmission.   

## 2018-09-26 ENCOUNTER — Ambulatory Visit (INDEPENDENT_AMBULATORY_CARE_PROVIDER_SITE_OTHER): Payer: Medicare Other | Admitting: *Deleted

## 2018-09-26 DIAGNOSIS — I255 Ischemic cardiomyopathy: Secondary | ICD-10-CM

## 2018-09-26 LAB — CUP PACEART REMOTE DEVICE CHECK
Battery Remaining Longevity: 34 mo
Battery Remaining Percentage: 29 %
Battery Voltage: 2.84 V
Brady Statistic RV Percent Paced: 1 %
Date Time Interrogation Session: 20200608065621
HighPow Impedance: 50 Ohm
Implantable Lead Implant Date: 20101028
Implantable Lead Location: 753860
Implantable Pulse Generator Implant Date: 20101028
Lead Channel Impedance Value: 460 Ohm
Lead Channel Pacing Threshold Amplitude: 0.75 V
Lead Channel Pacing Threshold Pulse Width: 0.4 ms
Lead Channel Sensing Intrinsic Amplitude: 11.9 mV
Lead Channel Setting Pacing Amplitude: 2.5 V
Lead Channel Setting Pacing Pulse Width: 0.4 ms
Lead Channel Setting Sensing Sensitivity: 0.5 mV
Pulse Gen Serial Number: 737526

## 2018-10-04 ENCOUNTER — Encounter: Payer: Self-pay | Admitting: Cardiology

## 2018-10-04 NOTE — Progress Notes (Signed)
Remote ICD transmission.   

## 2018-12-26 ENCOUNTER — Ambulatory Visit (INDEPENDENT_AMBULATORY_CARE_PROVIDER_SITE_OTHER): Payer: Medicare Other | Admitting: *Deleted

## 2018-12-26 DIAGNOSIS — I255 Ischemic cardiomyopathy: Secondary | ICD-10-CM

## 2018-12-26 DIAGNOSIS — I5022 Chronic systolic (congestive) heart failure: Secondary | ICD-10-CM

## 2018-12-26 LAB — CUP PACEART REMOTE DEVICE CHECK
Battery Remaining Longevity: 31 mo
Battery Remaining Percentage: 27 %
Battery Voltage: 2.83 V
Brady Statistic RV Percent Paced: 1 %
Date Time Interrogation Session: 20200907073015
HighPow Impedance: 50 Ohm
Implantable Lead Implant Date: 20101028
Implantable Lead Location: 753860
Implantable Pulse Generator Implant Date: 20101028
Lead Channel Impedance Value: 460 Ohm
Lead Channel Pacing Threshold Amplitude: 0.75 V
Lead Channel Pacing Threshold Pulse Width: 0.4 ms
Lead Channel Sensing Intrinsic Amplitude: 11.9 mV
Lead Channel Setting Pacing Amplitude: 2.5 V
Lead Channel Setting Pacing Pulse Width: 0.4 ms
Lead Channel Setting Sensing Sensitivity: 0.5 mV
Pulse Gen Serial Number: 737526

## 2019-01-10 NOTE — Progress Notes (Signed)
Remote ICD transmission.   

## 2019-02-19 DIAGNOSIS — Z23 Encounter for immunization: Secondary | ICD-10-CM | POA: Diagnosis not present

## 2019-03-04 DIAGNOSIS — M67811 Other specified disorders of synovium, right shoulder: Secondary | ICD-10-CM | POA: Diagnosis not present

## 2019-03-04 DIAGNOSIS — Z6824 Body mass index (BMI) 24.0-24.9, adult: Secondary | ICD-10-CM | POA: Diagnosis not present

## 2019-03-13 ENCOUNTER — Other Ambulatory Visit: Payer: Self-pay

## 2019-03-13 DIAGNOSIS — I714 Abdominal aortic aneurysm, without rupture, unspecified: Secondary | ICD-10-CM

## 2019-03-18 ENCOUNTER — Ambulatory Visit (INDEPENDENT_AMBULATORY_CARE_PROVIDER_SITE_OTHER): Payer: Medicare Other | Admitting: Family

## 2019-03-18 ENCOUNTER — Encounter: Payer: Self-pay | Admitting: Family

## 2019-03-18 ENCOUNTER — Ambulatory Visit (HOSPITAL_COMMUNITY)
Admission: RE | Admit: 2019-03-18 | Discharge: 2019-03-18 | Disposition: A | Discharge status: Home / Self Care | Payer: Medicare Other | Source: Ambulatory Visit | Attending: Family | Admitting: Family

## 2019-03-18 ENCOUNTER — Other Ambulatory Visit (HOSPITAL_COMMUNITY): Payer: Medicare Other

## 2019-03-18 ENCOUNTER — Other Ambulatory Visit: Payer: Self-pay

## 2019-03-18 VITALS — BP 139/83 | HR 61 | Temp 97.8°F | Resp 16 | Ht 72.0 in | Wt 183.0 lb

## 2019-03-18 DIAGNOSIS — Z87891 Personal history of nicotine dependence: Secondary | ICD-10-CM | POA: Diagnosis not present

## 2019-03-18 DIAGNOSIS — Z95828 Presence of other vascular implants and grafts: Secondary | ICD-10-CM

## 2019-03-18 DIAGNOSIS — I714 Abdominal aortic aneurysm, without rupture, unspecified: Secondary | ICD-10-CM

## 2019-03-18 NOTE — Progress Notes (Signed)
Virtual Visit via Telephone Note   I connected with Jordan Ross on 03/18/2019 using the D[employer]me by telephone and verified that I was speaking with the correct person using two identifiers. Patient was located at his home and accompanied by himself. I am located at the [facility] office/clinic.   The limitations of evaluation and management by telemedicine and the availability of in person appointments have been previously discussed with the patient and are documented in the patients chart. The patient expressed understanding and consented to proceed.  PCP: Redmond School, MD  Chief Complaint: Follow up s/p Endovascular Repair of Abdominal Aortic Aneurysm   History of Present Illness: Jordan Ross is a 73 y.o. male who returns today for follow-up. On 05-23-2015, he underwent endovascular repair of a 5 cm infrarenal abdominal aortic aneurysmby Dr. Trula Slade. His postoperative course was uncomplicated. His initial CT scan showed no evidence of endoleak.   He fell off of a ladder in mid 2017, andsuffered fractures to his left shoulder andleftfoot, was treated in the ED at that time; states his shoulder and foot have no residual issues  He denies claudication type sx's with walking, denies any hx of stroke or TIA.   Dr. Trula Slade last evaluated pt on 01-11-16. At that timecarotid Duplexdemonstrated<40% bilateral stenosis. AAA:No evidence of endoleak. The aneurysm haddecreased in size with a maximum diameter of 4.4 cm. Carotid: No further follow up needed as arteries were normal on duplex. AAA: S/p EVAR without complication. Aneurysm continues to decrease in size without evidence of endoleak. Follow-up 1 year with repeat ultrasound.  Pt denies back or abdominal pain.  Diabetic:Yes, last A1C result on file was 8.6 on 05-25-17 Tobacco [id number] smoker, quitin 2001, started smoking at age 52years   Past Medical History:  Diagnosis Date  . Abdominal aortic aneurysm  ([place])   . AICD (automatic cardioverter/defibrillator) present    St Jude  . Anemia    iron deficiency anemia,takes iron pill daily  . Arthritis   . Coronary artery disease    AWMI 2001  . Diverticulosis   . GERD (gastroesophageal reflux disease)    takes Protonix daily  . History of colon polyps    benign  . History of migraine 40 yrs ago  . History of shingles   . Hyperlipidemia    takes Atorvastatin and Niacin daily  . Hypertension    takes Diovan,Spironolactone, and Carvedilol daily  . ICD (implantable cardioverter-defibrillator), single, in situ   . Ischemic cardiomyopathy   . Joint swelling   . LV dysfunction    takes Digoxin daily  . Myocardial infarction ([place]) 2001  . Pneumonia 40 yrs ago   hx of  . Shortness of breath dyspnea    with exertion.States he was a smoker for yr  . Status post coronary artery bypass grafting   . Type 2 diabetes mellitus (HCC)    takes Glipizide,Metformin,Invokana,and Lantus daily.Average fasting blood sugar runs about 140    Past Surgical History:  Procedure Laterality Date  . ABDOMINAL AORTIC ENDOVASCULAR STENT GRAFT N/A 05/22/2015   Procedure: ABDOMINAL AORTIC ENDOVASCULAR STENT GRAFT;  Surgeon: Serafina Mitchell, MD;  Location: [facility];  Service: Vascular;  Laterality: N/A;  . CARDIAC CATHETERIZATION  2001  . COLONOSCOPY N/A 03/21/2014   Procedure: COLONOSCOPY;  Surgeon: Rogene Houston, MD;  [facility];  Service: Endoscopy;  Laterality: N/A;  855 - moved to 12/4 @ 8:30 - Ann notified pt  . CORONARY ARTERY BYPASS GRAFT  2001  unsure if it was 3 or 4  . DOPPLER ECHOCARDIOGRAPHY     2011 38% EF.2013 EF 30-35%    Current Meds  Medication Sig  . aspirin 81 MG tablet Take 81 mg by mouth daily.   [name] atorvastatin (LIPITOR) 40 MG tablet Take 40 mg by mouth daily.  . carvedilol (COREG) 12.5 MG tablet Take 12.5 mg by mouth 2 (two) times daily.  . digoxin (LANOXIN) 0.125 MG tablet Take 125 mcg by mouth daily.  [name] FORA LANCETS MISC  Test bid as directed. E11.65.  [name] glucose blood (FORA V10 BLOOD GLUCOSE TEST) test strip Use as instructed bid. E11.65  . glucose blood test strip Use as instructed  . Insulin Glargine (TOUJEO SOLOSTAR) 300 UNIT/ML SOPN Inject 70 Units into the skin at bedtime.  . IRON PO Take 65 mg by mouth daily.  [name] LANCETS ULTRA THIN MISC Test BG every morning  . LITETOUCH PEN NEEDLES 31G X 8 MM MISC USE AT BEDTIME AS DIRECTED.  [name] metFORMIN (GLUCOPHAGE) 500 MG tablet Take 1,000 mg by mouth 2 (two) times daily with a meal.   . niacin (NIASPAN) 1000 MG CR tablet Take 1 tablet (1,000 mg total) by mouth daily.  . pantoprazole (PROTONIX) 40 MG tablet Take 40 mg by mouth daily.  [name] spironolactone (ALDACTONE) 25 MG tablet Take 25 mg by mouth daily.  . valsartan (DIOVAN) 160 MG tablet Take 160 mg by mouth daily.    12 system ROS was negative unless otherwise noted in HPI   Observations/Objective:  DATA Endovascular Aortic Repair (EVAR) Duplex (03-18-19): +----------+----------------+-------------------+-------------------+           Diameter AP (cm)Diameter Trans (cm)Velocities (cm/sec) +----------+----------------+-------------------+-------------------+ Aorta     4.33            3.32               49                  +----------+----------------+-------------------+-------------------+ Right Limb1.48            1.49               82                  +----------+----------------+-------------------+-------------------+ Left Limb 1.57            1.59               73                  +----------+----------------+-------------------+-------------------+ Summary: Abdominal Aorta: The largest aortic measurement is 4.3 cm. Patent endovascular aneurysm repair with no evidence of endoleak. The largest aortic diameter remains essentially unchanged compared to prior exam. Previous diameter measurement was 4.2 cm obtained on 02/01/2018   Assessment and Plan: Jordan Ross is a 73 y.o. male who  presents s/p EVAR (Date: 05-23-2015).  Pt is asymptomatic with stable sac size at 4.3 cm.  I discussed with the patient the importance of surveillance of the endograft.   Follow Up Instructions:   Follow up 1 year with EVAR duplex   I discussed the assessment and treatment plan with the patient. The patient was provided an opportunity to ask questions and all were answered. The patient agreed with the plan and demonstrated an understanding of the instructions.   The patient was advised to call back or seek an in-person evaluation if the symptoms worsen or if the condition fails to improve as anticipated.  I spent  5 minutes with the patient via telephone encounter.   Gabrielle Dare [facility] Office: [phone]  03/18/2019, 10:18 AM

## 2019-03-26 DIAGNOSIS — I10 Essential (primary) hypertension: Secondary | ICD-10-CM | POA: Diagnosis not present

## 2019-03-26 DIAGNOSIS — E782 Mixed hyperlipidemia: Secondary | ICD-10-CM | POA: Diagnosis not present

## 2019-03-26 DIAGNOSIS — M67811 Other specified disorders of synovium, right shoulder: Secondary | ICD-10-CM | POA: Diagnosis not present

## 2019-03-26 DIAGNOSIS — E291 Testicular hypofunction: Secondary | ICD-10-CM | POA: Diagnosis not present

## 2019-03-26 DIAGNOSIS — E1165 Type 2 diabetes mellitus with hyperglycemia: Secondary | ICD-10-CM | POA: Diagnosis not present

## 2019-03-26 DIAGNOSIS — Z125 Encounter for screening for malignant neoplasm of prostate: Secondary | ICD-10-CM | POA: Diagnosis not present

## 2019-03-26 DIAGNOSIS — Z1389 Encounter for screening for other disorder: Secondary | ICD-10-CM | POA: Diagnosis not present

## 2019-03-26 DIAGNOSIS — E119 Type 2 diabetes mellitus without complications: Secondary | ICD-10-CM | POA: Diagnosis not present

## 2019-03-26 DIAGNOSIS — E7849 Other hyperlipidemia: Secondary | ICD-10-CM | POA: Diagnosis not present

## 2019-03-26 DIAGNOSIS — Z0000 Encounter for general adult medical examination without abnormal findings: Secondary | ICD-10-CM | POA: Diagnosis not present

## 2019-03-26 DIAGNOSIS — E663 Overweight: Secondary | ICD-10-CM | POA: Diagnosis not present

## 2019-03-26 DIAGNOSIS — I2510 Atherosclerotic heart disease of native coronary artery without angina pectoris: Secondary | ICD-10-CM | POA: Diagnosis not present

## 2019-03-26 DIAGNOSIS — Z6825 Body mass index (BMI) 25.0-25.9, adult: Secondary | ICD-10-CM | POA: Diagnosis not present

## 2019-03-27 ENCOUNTER — Ambulatory Visit (INDEPENDENT_AMBULATORY_CARE_PROVIDER_SITE_OTHER): Payer: Medicare Other | Admitting: *Deleted

## 2019-03-27 DIAGNOSIS — I255 Ischemic cardiomyopathy: Secondary | ICD-10-CM

## 2019-03-27 DIAGNOSIS — Z6825 Body mass index (BMI) 25.0-25.9, adult: Secondary | ICD-10-CM | POA: Diagnosis not present

## 2019-03-27 DIAGNOSIS — E1165 Type 2 diabetes mellitus with hyperglycemia: Secondary | ICD-10-CM | POA: Diagnosis not present

## 2019-03-27 DIAGNOSIS — E663 Overweight: Secondary | ICD-10-CM | POA: Diagnosis not present

## 2019-03-27 DIAGNOSIS — Z1389 Encounter for screening for other disorder: Secondary | ICD-10-CM | POA: Diagnosis not present

## 2019-03-27 DIAGNOSIS — Z0000 Encounter for general adult medical examination without abnormal findings: Secondary | ICD-10-CM | POA: Diagnosis not present

## 2019-03-27 LAB — CUP PACEART REMOTE DEVICE CHECK
Battery Remaining Longevity: 29 mo
Battery Remaining Percentage: 25 %
Battery Voltage: 2.81 V
Brady Statistic RV Percent Paced: 1 %
Date Time Interrogation Session: 20201209025844
HighPow Impedance: 47 Ohm
Implantable Lead Implant Date: 20101028
Implantable Lead Location: 753860
Implantable Pulse Generator Implant Date: 20101028
Lead Channel Impedance Value: 450 Ohm
Lead Channel Pacing Threshold Amplitude: 0.75 V
Lead Channel Pacing Threshold Pulse Width: 0.4 ms
Lead Channel Sensing Intrinsic Amplitude: 12 mV
Lead Channel Setting Pacing Amplitude: 2.5 V
Lead Channel Setting Pacing Pulse Width: 0.4 ms
Lead Channel Setting Sensing Sensitivity: 0.5 mV
Pulse Gen Serial Number: 737526

## 2019-04-09 ENCOUNTER — Other Ambulatory Visit: Payer: Self-pay | Admitting: *Deleted

## 2019-04-09 DIAGNOSIS — I714 Abdominal aortic aneurysm, without rupture, unspecified: Secondary | ICD-10-CM

## 2019-04-09 DIAGNOSIS — Z95828 Presence of other vascular implants and grafts: Secondary | ICD-10-CM

## 2019-05-04 NOTE — Progress Notes (Signed)
ICD remote 

## 2019-05-07 ENCOUNTER — Ambulatory Visit (HOSPITAL_COMMUNITY)
Admission: RE | Admit: 2019-05-07 | Discharge: 2019-05-07 | Disposition: A | Discharge status: Home / Self Care | Payer: Medicare Other | Source: Ambulatory Visit | Attending: Internal Medicine | Admitting: Internal Medicine

## 2019-05-07 ENCOUNTER — Other Ambulatory Visit: Payer: Self-pay

## 2019-05-07 ENCOUNTER — Other Ambulatory Visit (HOSPITAL_COMMUNITY): Payer: Self-pay | Admitting: Internal Medicine

## 2019-05-07 DIAGNOSIS — R05 Cough: Secondary | ICD-10-CM | POA: Diagnosis not present

## 2019-05-07 DIAGNOSIS — E119 Type 2 diabetes mellitus without complications: Secondary | ICD-10-CM | POA: Diagnosis not present

## 2019-05-07 DIAGNOSIS — K219 Gastro-esophageal reflux disease without esophagitis: Secondary | ICD-10-CM | POA: Diagnosis not present

## 2019-05-07 DIAGNOSIS — R059 Cough, unspecified: Secondary | ICD-10-CM

## 2019-05-07 DIAGNOSIS — Z681 Body mass index (BMI) 19 or less, adult: Secondary | ICD-10-CM | POA: Diagnosis not present

## 2019-05-10 DIAGNOSIS — Z681 Body mass index (BMI) 19 or less, adult: Secondary | ICD-10-CM | POA: Diagnosis not present

## 2019-05-10 DIAGNOSIS — E1165 Type 2 diabetes mellitus with hyperglycemia: Secondary | ICD-10-CM | POA: Diagnosis not present

## 2019-05-10 DIAGNOSIS — J9801 Acute bronchospasm: Secondary | ICD-10-CM | POA: Diagnosis not present

## 2019-05-10 DIAGNOSIS — J189 Pneumonia, unspecified organism: Secondary | ICD-10-CM | POA: Diagnosis not present

## 2019-06-11 DIAGNOSIS — Z681 Body mass index (BMI) 19 or less, adult: Secondary | ICD-10-CM | POA: Diagnosis not present

## 2019-06-11 DIAGNOSIS — R0602 Shortness of breath: Secondary | ICD-10-CM | POA: Diagnosis not present

## 2019-06-11 DIAGNOSIS — J22 Unspecified acute lower respiratory infection: Secondary | ICD-10-CM | POA: Diagnosis not present

## 2019-06-17 ENCOUNTER — Other Ambulatory Visit (HOSPITAL_COMMUNITY): Payer: Self-pay | Admitting: Internal Medicine

## 2019-06-17 ENCOUNTER — Other Ambulatory Visit: Payer: Self-pay

## 2019-06-17 ENCOUNTER — Ambulatory Visit (HOSPITAL_COMMUNITY)
Admission: RE | Admit: 2019-06-17 | Discharge: 2019-06-17 | Disposition: A | Discharge status: Home / Self Care | Payer: Medicare Other | Source: Ambulatory Visit | Attending: Internal Medicine | Admitting: Internal Medicine

## 2019-06-17 DIAGNOSIS — Z681 Body mass index (BMI) 19 or less, adult: Secondary | ICD-10-CM | POA: Diagnosis not present

## 2019-06-17 DIAGNOSIS — K219 Gastro-esophageal reflux disease without esophagitis: Secondary | ICD-10-CM | POA: Diagnosis not present

## 2019-06-17 DIAGNOSIS — R918 Other nonspecific abnormal finding of lung field: Secondary | ICD-10-CM | POA: Diagnosis not present

## 2019-06-17 DIAGNOSIS — R0602 Shortness of breath: Secondary | ICD-10-CM | POA: Diagnosis not present

## 2019-06-17 DIAGNOSIS — R0600 Dyspnea, unspecified: Secondary | ICD-10-CM

## 2019-06-17 DIAGNOSIS — J189 Pneumonia, unspecified organism: Secondary | ICD-10-CM | POA: Diagnosis not present

## 2019-06-17 DIAGNOSIS — I509 Heart failure, unspecified: Secondary | ICD-10-CM | POA: Diagnosis not present

## 2019-06-19 ENCOUNTER — Emergency Department (HOSPITAL_COMMUNITY): Payer: Medicare Other

## 2019-06-19 ENCOUNTER — Emergency Department (HOSPITAL_COMMUNITY)
Admission: EM | Admit: 2019-06-19 | Discharge: 2019-06-19 | Disposition: A | Discharge status: Home / Self Care | Payer: Medicare Other | Attending: Emergency Medicine | Admitting: Emergency Medicine

## 2019-06-19 ENCOUNTER — Encounter (HOSPITAL_COMMUNITY): Payer: Self-pay | Admitting: *Deleted

## 2019-06-19 ENCOUNTER — Other Ambulatory Visit: Payer: Self-pay

## 2019-06-19 DIAGNOSIS — I2510 Atherosclerotic heart disease of native coronary artery without angina pectoris: Secondary | ICD-10-CM | POA: Insufficient documentation

## 2019-06-19 DIAGNOSIS — I517 Cardiomegaly: Secondary | ICD-10-CM | POA: Diagnosis not present

## 2019-06-19 DIAGNOSIS — Z87891 Personal history of nicotine dependence: Secondary | ICD-10-CM | POA: Diagnosis not present

## 2019-06-19 DIAGNOSIS — R0602 Shortness of breath: Secondary | ICD-10-CM | POA: Diagnosis present

## 2019-06-19 DIAGNOSIS — Z79899 Other long term (current) drug therapy: Secondary | ICD-10-CM | POA: Insufficient documentation

## 2019-06-19 DIAGNOSIS — Z7982 Long term (current) use of aspirin: Secondary | ICD-10-CM | POA: Diagnosis not present

## 2019-06-19 DIAGNOSIS — I110 Hypertensive heart disease with heart failure: Secondary | ICD-10-CM | POA: Insufficient documentation

## 2019-06-19 DIAGNOSIS — R918 Other nonspecific abnormal finding of lung field: Secondary | ICD-10-CM | POA: Diagnosis not present

## 2019-06-19 DIAGNOSIS — I5033 Acute on chronic diastolic (congestive) heart failure: Secondary | ICD-10-CM

## 2019-06-19 DIAGNOSIS — E119 Type 2 diabetes mellitus without complications: Secondary | ICD-10-CM | POA: Diagnosis not present

## 2019-06-19 DIAGNOSIS — Z794 Long term (current) use of insulin: Secondary | ICD-10-CM | POA: Diagnosis not present

## 2019-06-19 DIAGNOSIS — Z95810 Presence of automatic (implantable) cardiac defibrillator: Secondary | ICD-10-CM | POA: Insufficient documentation

## 2019-06-19 DIAGNOSIS — I5032 Chronic diastolic (congestive) heart failure: Secondary | ICD-10-CM | POA: Diagnosis not present

## 2019-06-19 LAB — TROPONIN I (HIGH SENSITIVITY)
Troponin I (High Sensitivity): 40 ng/L — ABNORMAL HIGH (ref <18)
Troponin I (High Sensitivity): 41 ng/L — ABNORMAL HIGH (ref <18)

## 2019-06-19 LAB — BASIC METABOLIC PANEL
Anion gap: 13 (ref 5–15)
BUN: 31 mg/dL — ABNORMAL HIGH (ref 8–23)
CO2: 27 mmol/L (ref 22–32)
Calcium: 9.5 mg/dL (ref 8.9–10.3)
Chloride: 97 mmol/L — ABNORMAL LOW (ref 98–111)
Creatinine, Ser: 1.26 mg/dL — ABNORMAL HIGH (ref 0.61–1.24)
GFR calc Af Amer: >60 mL/min (ref >60)
GFR calc non Af Amer: 57 mL/min — ABNORMAL LOW (ref >60)
Glucose, Bld: 299 mg/dL — ABNORMAL HIGH (ref 70–99)
Potassium: 4.1 mmol/L (ref 3.5–5.1)
Sodium: 137 mmol/L (ref 135–145)

## 2019-06-19 LAB — BRAIN NATRIURETIC PEPTIDE: B Natriuretic Peptide: 1000 pg/mL — ABNORMAL HIGH (ref 0.0–100.0)

## 2019-06-19 LAB — CBC
HCT: 42.2 % (ref 39.0–52.0)
Hemoglobin: 13.6 g/dL (ref 13.0–17.0)
MCH: 30.2 pg (ref 26.0–34.0)
MCHC: 32.2 g/dL (ref 30.0–36.0)
MCV: 93.8 fL (ref 80.0–100.0)
Platelets: 321 10*3/uL (ref 150–400)
RBC: 4.5 MIL/uL (ref 4.22–5.81)
RDW: 14.3 % (ref 11.5–15.5)
WBC: 11.8 10*3/uL — ABNORMAL HIGH (ref 4.0–10.5)
nRBC: 0 % (ref 0.0–0.2)

## 2019-06-19 MED ORDER — SODIUM CHLORIDE 0.9% FLUSH: 3.0000 mL | Freq: Once | INTRAVENOUS | Status: DC

## 2019-06-19 NOTE — ED Triage Notes (Signed)
Pt states that he had an outpatient ct chest performed on Monday, was told that he had fluid and was prescribed lasix, pt reports improvement of symptoms but Dr Gerarda Fraction is requesting to have additional blood work and another ct of chest performed today. Pt reports that he is feeling better, denies any complaints, advised that if he was not called by Dr To come back he would not be here.

## 2019-06-19 NOTE — ED Provider Notes (Signed)
[facility] EMERGENCY DEPARTMENT Provider Note   CSN: [id number] Arrival date & time: 06/19/19  1244     History Chief Complaint  Patient presents with  . Shortness of Breath    Jordan Ross is a 74 y.o. male.  Patient states he was contacted by his PCP today and told to report to the ED for repeat labs and chest xray. History indicates patient was treated recently for bilateral lobar pneumonia. He was seen in the office on Monday, 06/17/19, with increased work of breathing. CXR obtained that day revealed increased interstitial opacity bilaterally without significant change since 05/07/19. Patient was started on lasix. Patient has taken lasix for three doses. He is not currently short of breath. Denies chest pain. He states that he is feeling fine today, and would not have come in if not contacted by his provider.  The history is provided by the patient and medical records.  Shortness of Breath Progression:  Resolved Chronicity:  Recurrent Relieved by:  Diuretics Associated symptoms: no abdominal pain, no chest pain, no cough and no fever        Past Medical History:  Diagnosis Date  . Abdominal aortic aneurysm ([place])   . AICD (automatic cardioverter/defibrillator) present    St Jude  . Anemia    iron deficiency anemia,takes iron pill daily  . Arthritis   . Coronary artery disease    AWMI 2001  . Diverticulosis   . GERD (gastroesophageal reflux disease)    takes Protonix daily  . History of colon polyps    benign  . History of migraine 40 yrs ago  . History of shingles   . Hyperlipidemia    takes Atorvastatin and Niacin daily  . Hypertension    takes Diovan,Spironolactone, and Carvedilol daily  . ICD (implantable cardioverter-defibrillator), single, in situ   . Ischemic cardiomyopathy   . Joint swelling   . LV dysfunction    takes Digoxin daily  . Myocardial infarction ([place]) 2001  . Pneumonia 40 yrs ago   hx of  . Shortness of breath dyspnea    with  exertion.States he was a smoker for yr  . Status post coronary artery bypass grafting   . Type 2 diabetes mellitus (HCC)    takes Glipizide,Metformin,Invokana,and Lantus daily.Average fasting blood sugar runs about 140    Patient Active Problem List   Diagnosis Date Noted  . Chronic systolic heart failure ([place]) 12/17/2015  . AAA (abdominal aortic aneurysm) ([place]) 05/22/2015  . Coronary artery disease 03/29/2013  . Cardiomyopathy, ischemic 03/29/2013  . ICD (implantable cardioverter-defibrillator), single, in situ 03/29/2013  . Essential hypertension 03/29/2013  . Hyperlipidemia 03/29/2013  . Type 2 diabetes mellitus with vascular disease ([place]) 03/29/2013  . Abdominal aortic aneurysm ([place]) 03/29/2013  . Bilateral renal artery stenosis ([place]) 03/29/2013    Past Surgical History:  Procedure Laterality Date  . ABDOMINAL AORTIC ENDOVASCULAR STENT GRAFT N/A 05/22/2015   Procedure: ABDOMINAL AORTIC ENDOVASCULAR STENT GRAFT;  Surgeon: Serafina Mitchell, MD;  Location: [place];  Service: Vascular;  Laterality: N/A;  . CARDIAC CATHETERIZATION  2001  . COLONOSCOPY N/A 03/21/2014   Procedure: COLONOSCOPY;  Surgeon: Rogene Houston, MD;  [facility];  Service: Endoscopy;  Laterality: N/A;  855 - moved to 12/4 @ 8:30 - Ann notified pt  . CORONARY ARTERY BYPASS GRAFT  2001   unsure if it was 3 or 4  . DOPPLER ECHOCARDIOGRAPHY     2011 38% EF.2013 EF 30-35%  Family History  Problem Relation Age of Onset  . COPD Mother   . Heart disease Brother     Social History   Tobacco Use  . Smoking status: Former Smoker    Years: 40.00  . Smokeless tobacco: Never Used  . Tobacco comment: quit smoking in 2001  Substance Use Topics  . Alcohol use: No  . Drug use: No    Home Medications Prior to Admission medications   Medication Sig Start Date End Date Taking? Authorizing Provider  aspirin 81 MG tablet Take 81 mg by mouth daily.     [provider]  atorvastatin (LIPITOR)  40 MG tablet Take 40 mg by mouth daily. 03/25/13   [provider]  carvedilol (COREG) 12.5 MG tablet Take 12.5 mg by mouth 2 (two) times daily. 03/25/13   [provider]  digoxin (LANOXIN) 0.125 MG tablet Take 125 mcg by mouth daily. 03/25/13   [provider]  FORA LANCETS MISC Test bid as directed. E11.65. 04/08/16   Cassandria Anger, MD  glucose blood (FORA V10 BLOOD GLUCOSE TEST) test strip Use as instructed bid. E11.65 05/25/17   Cassandria Anger, MD  glucose blood test strip Use as instructed 06/25/15   Cassandria Anger, MD  Insulin Glargine (TOUJEO SOLOSTAR) 300 UNIT/ML SOPN Inject 70 Units into the skin at bedtime. 05/25/17   Cassandria Anger, MD  IRON PO Take 65 mg by mouth daily.    [provider]  LANCETS ULTRA THIN MISC Test BG every morning 02/26/16   Cassandria Anger, MD  LITETOUCH PEN NEEDLES 31G X 8 MM MISC USE AT BEDTIME AS DIRECTED. 12/14/15   Cassandria Anger, MD  metFORMIN (GLUCOPHAGE) 500 MG tablet Take 1,000 mg by mouth 2 (two) times daily with a meal.  03/25/13   [provider]  niacin (NIASPAN) 1000 MG CR tablet Take 1 tablet (1,000 mg total) by mouth daily. 06/21/16   Cassandria Anger, MD  pantoprazole (PROTONIX) 40 MG tablet Take 40 mg by mouth daily. 03/25/13   [provider]  spironolactone (ALDACTONE) 25 MG tablet Take 25 mg by mouth daily. 03/25/13   [provider]  valsartan (DIOVAN) 160 MG tablet Take 160 mg by mouth daily. 03/25/13   [provider]    Allergies    Patient has no known allergies.  Review of Systems   Review of Systems  Constitutional: Negative for fever.  Respiratory: Negative for cough and shortness of breath.   Cardiovascular: Negative for chest pain.  Gastrointestinal: Negative for abdominal pain.  All other systems reviewed and are negative.   Physical Exam Updated Vital Signs BP 122/89   Pulse 82   Temp 98.1 F (36.7 C) (Oral)    Resp 18   Ht 6' (1.829 m)   Wt 81.6 kg   SpO2 100%   BMI 24.41 kg/m   Physical Exam Vitals and nursing note reviewed.  Constitutional:      General: He is not in acute distress.    Appearance: He is well-developed. He is not ill-appearing.  HENT:     Head: Normocephalic.  Eyes:     Conjunctiva/sclera: Conjunctivae normal.  Cardiovascular:     Rate and Rhythm: Normal rate and regular rhythm.  Pulmonary:     Effort: Pulmonary effort is normal.     Breath sounds: Normal breath sounds.  Abdominal:     Palpations: Abdomen is soft.  Musculoskeletal:        General:  Normal range of motion.     Cervical back: Neck supple.     Right lower leg: No edema.     Left lower leg: No edema.  Skin:    General: Skin is warm and dry.  Neurological:     Mental Status: He is alert and oriented to person, place, and time.  Psychiatric:        Behavior: Behavior normal.     ED Results / Procedures / Treatments   Labs (all labs ordered are listed, but only abnormal results are displayed) Labs Reviewed  BASIC METABOLIC PANEL - Abnormal; Notable for the following components:      Result Value   Chloride 97 (*)    Glucose, Bld 299 (*)    BUN 31 (*)    Creatinine, Ser 1.26 (*)    GFR calc non Af Amer 57 (*)    All other components within normal limits  CBC - Abnormal; Notable for the following components:   WBC 11.8 (*)    All other components within normal limits  BRAIN NATRIURETIC PEPTIDE - Abnormal; Notable for the following components:   B Natriuretic Peptide 1,000.0 (*)    All other components within normal limits  TROPONIN I (HIGH SENSITIVITY) - Abnormal; Notable for the following components:   Troponin I (High Sensitivity) 40 (*)    All other components within normal limits  TROPONIN I (HIGH SENSITIVITY) - Abnormal; Notable for the following components:   Troponin I (High Sensitivity) 41 (*)    All other components within normal limits    EKG None  Radiology DG Chest 2  View  Result Date: 06/19/2019 CLINICAL DATA:  Follow-up EXAM: CHEST - 2 VIEW COMPARISON:  06/17/2019 FINDINGS: Cardiomegaly status post median sternotomy and CABG with left chest pacer defibrillator. Mild, diffuse interstitial pulmonary opacity, slightly improved compared to prior examination. Visualized skeletal structures are unremarkable. IMPRESSION: Cardiomegaly with mild, diffuse interstitial pulmonary opacity slightly improved compared to prior examination, consistent with improved edema. No new or focal airspace opacity. Electronically Signed   By: Eddie Candle M.D.   On: 06/19/2019 17:05    Procedures Procedures (including critical care time)  Medications Ordered in ED Medications  sodium chloride flush (NS) 0.9 % injection 3 mL (has no administration in time range)    ED Course  I have reviewed the triage vital signs and the nursing notes.  Pertinent labs & imaging results that were available during my care of the patient were reviewed by me and considered in my medical decision making (see chart for details).    MDM Rules/Calculators/A&P                      Patient with history of CHF. He is followed by cardiology ([facility]) in [place]. He was seen by his PCP on Monday for shortness of breath and started on lasix. Shortness of breath has resolved. No orthopnea.  Repeat CXR today shows improvement in vascular congestion. BNP today 1000. Mild bump in creatinine and BUN. Elevated troponin likely due to CHF. No current dyspnea or lower extremity edema. Patient discussed with Dr. Roderic Palau. Plan is to discharge patient home with continuation of lasix. Patient to follow-up with his cardiologist and PCP. Return precautions discussed. Final Clinical Impression(s) / ED Diagnoses Final diagnoses:  Congestive heart failure with left ventricular diastolic dysfunction, acute on chronic ([place])    Rx / DC Orders ED Discharge Orders    None  Etta Quill, NP 06/19/19 1851     Milton Ferguson, MD 06/21/19 1200

## 2019-06-20 ENCOUNTER — Telehealth: Payer: Self-pay | Admitting: Cardiovascular Disease

## 2019-06-20 NOTE — Telephone Encounter (Signed)
Pt c/o swelling: STAT is pt has developed SOB within 24 hours  1) How much weight have you gained and in what time span? None  2) If swelling, where is the swelling located? Feet and ankles  3) Are you currently taking a fluid pill?   Yes, Lasix. Per patient's daughter the patient took 3 tablets on 06/17/19 and 3 tablets on 06/18/19.  4) Are you currently SOB? No  5) Do you have a log of your daily weights (if so, list)? No  6) Have you gained 3 pounds in a day or 5 pounds in a week? No  7) Have you traveled recently? No

## 2019-06-20 NOTE — Telephone Encounter (Signed)
Spoke with pt wife, he was seen in the ER yesterday and his lab work and cxr were abnormal. He was given additional lasix and is feeling better today, but they told them he needed to see [place] soon. He will have a virtual visit with APP tomorrow.

## 2019-06-20 NOTE — Progress Notes (Signed)
Virtual Visit via Telephone Note   This visit type was conducted due to national recommendations for restrictions regarding the COVID-19 Pandemic (e.g. social distancing) in an effort to limit this patient's exposure and mitigate transmission in our community.  Due to his co-morbid illnesses, this patient is at least at moderate risk for complications without adequate follow up.  This format is felt to be most appropriate for this patient at this time.  The patient did not have access to video technology/had technical difficulties with video requiring transitioning to audio format only (telephone).  All issues noted in this document were discussed and addressed.  No physical exam could be performed with this format.  Please refer to the patient's chart for his  consent to telehealth for [facility].  Evaluation Performed:  Follow-up visit  This visit type was conducted due to national recommendations for restrictions regarding the COVID-19 Pandemic (e.g. social distancing).  This format is felt to be most appropriate for this patient at this time.  All issues noted in this document were discussed and addressed.  No physical exam was performed (except for noted visual exam findings with Video Visits).  Please refer to the patient's chart (MyChart message for video visits and phone note for telephone visits) for the patient's consent to telehealth for [facility]  Date:  06/21/2019   ID:  Jordan Ross, DOB [birth year], MRN [id number]  Patient Location:  [id number] [place] [address]   Provider location:      [place] [address] Office [phone] Fax [phone]   PCP:  Redmond School, MD  Cardiologist:  Sanda Klein, MD  Electrophysiologist:  None   Chief Complaint: Follow-up  History of Present Illness:    Jordan Ross is a 74 y.o. male who presents via audio/video conferencing for a telehealth visit today.   Patient verified DOB and address.  The patient does not symptoms concerning for COVID-19 infection (fever, chills, cough, or new SHORTNESS OF BREATH).   Jordan Ross has a past medical history of coronary artery disease, ischemic cardiomyopathy, essential hypertension, type 2 diabetes, abdominal aortic aneurysm, chronic systolic heart failure, ICD, and hyperlipidemia.  EVAR-AAA 2/17.  Via Dr. Trula Slade 01/2017.  He had an anterior MI 2001 treated with coronary bypass grafting by Dr. Rodolph Bong, SVG to ramus, circumflex, and PDA.  His EF at that time was 35%.  He underwent ICD implantation for primary prevention 10/10.  When he was last seen by Dr. Sallyanne Kuster  03/28/2018 he had lost weight.  His PCP had seen him the week prior for increased work of breathing with exertion.  A chest x-ray was ordered and suggested pulmonary venous congestion.  He was prescribed two doses of furosemide and showed improvement.  His spironolactone, valsartan, and carvedilol were continued.  He was feeling much better at his follow-up with Dr. Sallyanne Kuster.  At that time he denied orthopnea, PND, and indicated that he had not had any lower extremity edema.  His single-chamber [place] ICD was interrogated and showed a dip in his thoracic impedance consistent with volume overload throughout November.  That showed a rapid improvement over the days prior to his cardiology visit.  It was noted that his thoracic impedance had always been fairly labile and was not always correlated well with his clinical presentation.  At that time it was estimated that his device longevity was over 3 years and his lead parameters were excellent.  No VT/VF was seen.  He presented to the emergency department on 06/19/2019 at the direction of his PCP.  He received 3 doses of Lasix.  His shortness of breath resolved.  His BNP was 1000, creatinine 1.26, BUN 31, high-sensitivity troponins 40 and 41.  He was discharged home.  He is seen virtually today for  follow-up and states he feels much better since being to the emergency department.  He continues to take Lasix 20 mg a day.  He states he is breathing much better now, has much less lower extremity edema, and his weight is 171 pounds.  He was 180 pounds in the emergency department on 06/19/2019.  He states that he was not following a low-sodium diet however, he is now choosing low-sodium options.  I will have him continue to weigh daily, give him the salty 6 she, and have a BMP drawn in 1 week.  Today he denies chest pain, shortness of breath, increased lower extremity edema, fatigue, palpitations, melena, hematuria, hemoptysis, diaphoresis, weakness, presyncope, syncope, orthopnea, and PND.   Prior CV studies:   The following studies were reviewed today:  EKG 06/19/2019 Sinus rhythm with sinus arrhythmia with occasional PVCs Left axis deviation LVH with QRS widening 87 bpm  Echocardiogram 04/17/2012 Study Conclusions   - Left ventricle: The cavity size was moderately dilated.  Systolic function was moderately to severely reduced. The  estimated ejection fraction was in the range of 30% to  35%. Severe hypokinesis of the anteroseptal myocardium.  Moderate hypokinesis of the apical myocardium. Doppler  parameters are consistent with abnormal left ventricular  relaxation (grade 1 diastolic dysfunction).  - Aortic valve: Valve area: 1.05cm^2(VTI). Valve area:  0.99cm^2 (Vmax).  - Mitral valve: Calcified annulus. Mild regurgitation. Valve  area by pressure half-time: 1.48cm^2. Valve area by  continuity equation (using LVOT flow): 1.32cm^2.  - Left atrium: The atrium was mildly dilated.  - Atrial septum: No defect or patent foramen ovale was  identified.    Past Medical History:  Diagnosis Date  . Abdominal aortic aneurysm ([place])   . AICD (automatic cardioverter/defibrillator) present    St Jude  . Anemia    iron deficiency anemia,takes iron pill daily  . Arthritis     . Coronary artery disease    AWMI 2001  . Diverticulosis   . GERD (gastroesophageal reflux disease)    takes Protonix daily  . History of colon polyps    benign  . History of migraine 40 yrs ago  . History of shingles   . Hyperlipidemia    takes Atorvastatin and Niacin daily  . Hypertension    takes Diovan,Spironolactone, and Carvedilol daily  . ICD (implantable cardioverter-defibrillator), single, in situ   . Ischemic cardiomyopathy   . Joint swelling   . LV dysfunction    takes Digoxin daily  . Myocardial infarction ([place]) 2001  . Pneumonia 40 yrs ago   hx of  . Shortness of breath dyspnea    with exertion.States he was a smoker for yr  . Status post coronary artery bypass grafting   . Type 2 diabetes mellitus (HCC)    takes Glipizide,Metformin,Invokana,and Lantus daily.Average fasting blood sugar runs about 140   Past Surgical History:  Procedure Laterality Date  . ABDOMINAL AORTIC ENDOVASCULAR STENT GRAFT N/A 05/22/2015   Procedure: ABDOMINAL AORTIC ENDOVASCULAR STENT GRAFT;  Surgeon: Serafina Mitchell, MD;  Location: [place];  Service: Vascular;  Laterality: N/A;  . CARDIAC CATHETERIZATION  2001  . COLONOSCOPY N/A 03/21/2014  Procedure: COLONOSCOPY;  Surgeon: Rogene Houston, MD;  [facility];  Service: Endoscopy;  Laterality: N/A;  855 - moved to 12/4 @ 8:30 - Ann notified pt  . CORONARY ARTERY BYPASS GRAFT  2001   unsure if it was 3 or 4  . DOPPLER ECHOCARDIOGRAPHY     2011 38% EF.2013 EF 30-35%     Current Meds  Medication Sig  . albuterol (VENTOLIN HFA) 108 (90 Base) MCG/ACT inhaler INHALE 1 OR 2 PUFFS INTOFTHE LUNGS EVERY 4 HOURS AS NEEDED  . aspirin 81 MG tablet Take 81 mg by mouth daily.   [name] atorvastatin (LIPITOR) 40 MG tablet Take 40 mg by mouth daily.  . carvedilol (COREG) 12.5 MG tablet Take 12.5 mg by mouth 2 (two) times daily.  . digoxin (LANOXIN) 0.125 MG tablet Take 125 mcg by mouth daily.  [name] FORA LANCETS MISC Test bid as directed. E11.65.  .  furosemide (LASIX) 20 MG tablet Take 20 mg by mouth daily.  [name] glucose blood (FORA V10 BLOOD GLUCOSE TEST) test strip Use as instructed bid. E11.65  . glucose blood test strip Use as instructed  . IRON PO Take 65 mg by mouth daily.  [name] LANCETS ULTRA THIN MISC Test BG every morning  . LITETOUCH PEN NEEDLES 31G X 8 MM MISC USE AT BEDTIME AS DIRECTED.  [name] metFORMIN (GLUCOPHAGE) 500 MG tablet Take 1,000 mg by mouth 2 (two) times daily with a meal.   . pantoprazole (PROTONIX) 40 MG tablet Take 40 mg by mouth daily.  [name] spironolactone (ALDACTONE) 25 MG tablet Take 25 mg by mouth daily.  . valsartan (DIOVAN) 160 MG tablet Take 160 mg by mouth daily.  . [DISCONTINUED] niacin (NIASPAN) 1000 MG CR tablet Take 1 tablet (1,000 mg total) by mouth daily.     Allergies:   Patient has no known allergies.   Social History   Tobacco Use  . Smoking status: Former Smoker    Years: 40.00  . Smokeless tobacco: Never Used  . Tobacco comment: quit smoking in 2001  Substance Use Topics  . Alcohol use: No  . Drug use: No     Family Hx: The patient's family history includes COPD in his mother; Heart disease in his brother.  ROS:   Please see the history of present illness.     All other systems reviewed and are negative.   Labs/Other Tests and Data Reviewed:    Recent Labs: 06/19/2019: B Natriuretic Peptide 1,000.0; BUN 31; Creatinine, Ser 1.26; Hemoglobin 13.6; Platelets 321; Potassium 4.1; Sodium 137   Recent Lipid Panel Lab Results  Component Value Date/Time   CHOL 126 12/23/2016 09:43 AM   TRIG 136 12/23/2016 09:43 AM   HDL 41 12/23/2016 09:43 AM   CHOLHDL 3.1 12/23/2016 09:43 AM   CHOLHDL 7.8 (H) 06/14/2016 02:58 PM   LDLCALC 58 12/23/2016 09:43 AM    Wt Readings from Last 3 Encounters:  06/21/19 171 lb (77.6 kg)  06/19/19 180 lb (81.6 kg)  03/18/19 183 lb (83 kg)     Exam:    Vital Signs:  BP 130/80   Pulse 60   Wt 171 lb (77.6 kg)   BMI 23.19 kg/m    Well nourished, well  developed male in no  acute distress.   ASSESSMENT & PLAN:    1.  Acute on chronic systolic heart failure-no increased work of breathing today,  decreased lower extremity edema.  Presented to the emergency department 06/19/2019 and was given three  doses of Lasix.  His breathing improved and he was discharged home.  BNP was 1000 and high-sensitivity troponins were 40 and 41. Continue carvedilol 12.5 mg twice daily Continue digoxin 0.125 mg daily Continue furosemide 20 mg daily Continue valsartan 160 mg daily Continue spironolactone 25 mg daily Heart healthy low-sodium diet-salty six given Daily weights-dry weight around 171 pounds Elevate lower extremities when not active Lower extremity support stockings-handout given. BMP in one week  Coronary artery disease-no chest pain today.  Status post CABG Dr. Rodolph Bong, SVG to ramus, circumflex, and PDA.  2001 Continue aspirin 81 mg tablet daily Continue atorvastatin 80 mg daily Continue carvedilol 12.5 mg twice daily Continue valsartan 160 mg daily Heart healthy low-sodium diet Increase physical activity as tolerated  Essential hypertension-BP today 130/80. Continue carvedilol 12.5 mg daily Continue furosemide 20 mg daily Continue spironolactone 25 mg daily Continue valsartan 160 mg daily Heart healthy low-sodium diet Increase physical activity as tolerated  ICD-Saint Jude single-chamber implanted 10/10.  Last remote transmission on 03/27/2019.  29 months remaining battery longevity.  Thoracic impedance noted to not always be a good indicator of clinical status. Continue to monitor  AAA status post EVAR-followed by Dr. Trula Slade.  Last evaluated 03/18/2019 and remained stable/unchanged.  COVID-19 Education: The signs and symptoms of COVID-19 were discussed with the patient and how to seek care for testing (follow up with PCP or arrange E-visit).  The importance of social distancing was discussed today.  Patient Risk:   After full  review of this patients clinical status, I feel that they are at least moderate risk at this time.  Time:   Today, I have spent 15 minutes with the patient with telehealth technology discussing furosemide, diet, daily weights, physical activity, lower extremity support stockings.     Medication Adjustments/Labs and Tests Ordered: Current medicines are reviewed at length with the patient today.  Concerns regarding medicines are outlined above.   Tests Ordered: Orders Placed This Encounter  Procedures  . Basic metabolic panel   Medication Changes: No orders of the defined types were placed in this encounter.   Disposition:  in 2 week(s)  Signed, Jossie Ng. [facility] [place] [address] Office [phone] Fax [phone]

## 2019-06-21 ENCOUNTER — Telehealth (INDEPENDENT_AMBULATORY_CARE_PROVIDER_SITE_OTHER): Payer: Medicare Other | Admitting: General Practice

## 2019-06-21 VITALS — BP 130/80 | HR 60 | Wt 171.0 lb

## 2019-06-21 DIAGNOSIS — I255 Ischemic cardiomyopathy: Secondary | ICD-10-CM | POA: Diagnosis not present

## 2019-06-21 DIAGNOSIS — I712 Thoracic aortic aneurysm, without rupture: Secondary | ICD-10-CM

## 2019-06-21 DIAGNOSIS — I7121 Aneurysm of the ascending aorta, without rupture: Secondary | ICD-10-CM

## 2019-06-21 DIAGNOSIS — Z79899 Other long term (current) drug therapy: Secondary | ICD-10-CM

## 2019-06-21 DIAGNOSIS — I10 Essential (primary) hypertension: Secondary | ICD-10-CM

## 2019-06-21 DIAGNOSIS — I25810 Atherosclerosis of coronary artery bypass graft(s) without angina pectoris: Secondary | ICD-10-CM

## 2019-06-21 DIAGNOSIS — Z95810 Presence of automatic (implantable) cardiac defibrillator: Secondary | ICD-10-CM

## 2019-06-21 DIAGNOSIS — I5023 Acute on chronic systolic (congestive) heart failure: Secondary | ICD-10-CM

## 2019-06-21 NOTE — Patient Instructions (Signed)
Medication Instructions:  The current medical regimen is effective;  continue present plan and medications as directed. Please refer to the Current Medication list given to you today. If you need a refill on your cardiac medications before your next appointment, please call your pharmacy.  Labwork: BMET IN 1 WEEK HERE IN OUR OFFICE AT [facility]     You will NOT need to fast   If you have labs (blood work) drawn today and your tests are completely normal, you will receive your results only by: [name] MyChart Message (if you have MyChart) OR A paper copy in the mail If you have any lab test that is abnormal or we need to change your treatment, we will call you to review these results. You may go to any [facility] lab that is convenient for you however, we do have a lab in our office that is able to assist you. You do NOT need an appointment for our lab. Once in our office in our office lobby there is a podium where you sign-in and ring the doorbell to alert [place] that you are here. Lab is open 8:00am and closes at 4:00pm; closes for lunch from 12:45 - 1:45pm. PLEASE BRING A COPY OF YOUR INSURANCE CARD WITH YOU.  Special Instructions: PLEASE PURCHASE AND WEAR COMPRESSION STOCKINGS DAILY AND OFF AT BEDTIME. Compression stockings are elastic socks that squeeze the legs. They help to increase blood flow to the legs and to decrease swelling in the legs from fluid retention, and reduce the chance of developing blood clots in the lower legs. ELEVATE YOUR LOWER EXTREMITIES WHEN SITTING.  [facility] [address] [place], [place] [address] PHONE [phone]  TAKE AND LOG YOUR WEIGHT DAILY  PLEASE READ AND FOLLOW SALTY 6 ATTACHED  Follow-Up: ON 07-04-2019 @ 3PM  In Person [place], FNP-C.    At [facility], you and your health needs are our priority.  As part of our continuing mission to provide you with exceptional heart care, we have created designated Provider Care Teams.  These Care Teams  include your primary Cardiologist (physician) and Advanced Practice Providers (APPs -  Physician Assistants and Nurse Practitioners) who all work together to provide you with the care you need, when you need it.  Reduce your risk of getting COVID-19 With your heart disease it is especially important for people at increased risk of severe illness from COVID-19, and those who live with them, to protect themselves from getting COVID-19. The best way to protect yourself and to help reduce the spread of the virus that causes COVID-19 is to: [name] Limit your interactions with other people as much as possible. . Take COVID-19 when you do interact with others. If you start feeling sick and think you may have COVID-19, get in touch with your healthcare provider within 24 hours.  Thank you for choosing [facility] at [facility]!!             NAME:  DATE TIME WT BP  HR COMMENT DATE TIME WT BP  HR COMMENT

## 2019-06-21 NOTE — Progress Notes (Signed)
TY, Jesse. 

## 2019-06-26 ENCOUNTER — Ambulatory Visit (INDEPENDENT_AMBULATORY_CARE_PROVIDER_SITE_OTHER): Payer: Medicare Other | Admitting: *Deleted

## 2019-06-26 DIAGNOSIS — I255 Ischemic cardiomyopathy: Secondary | ICD-10-CM

## 2019-06-26 LAB — CUP PACEART REMOTE DEVICE CHECK
Battery Remaining Longevity: 26 mo
Battery Remaining Percentage: 23 %
Battery Voltage: 2.8 V
Brady Statistic RV Percent Paced: 1 %
Date Time Interrogation Session: 20210310032719
HighPow Impedance: 55 Ohm
Implantable Lead Implant Date: 20101028
Implantable Lead Location: 753860
Implantable Pulse Generator Implant Date: 20101028
Lead Channel Impedance Value: 540 Ohm
Lead Channel Pacing Threshold Amplitude: 0.75 V
Lead Channel Pacing Threshold Pulse Width: 0.4 ms
Lead Channel Sensing Intrinsic Amplitude: 12 mV
Lead Channel Setting Pacing Amplitude: 2.5 V
Lead Channel Setting Pacing Pulse Width: 0.4 ms
Lead Channel Setting Sensing Sensitivity: 0.5 mV
Pulse Gen Serial Number: 737526

## 2019-06-27 DIAGNOSIS — Z79899 Other long term (current) drug therapy: Secondary | ICD-10-CM | POA: Diagnosis not present

## 2019-06-27 DIAGNOSIS — I255 Ischemic cardiomyopathy: Secondary | ICD-10-CM | POA: Diagnosis not present

## 2019-06-27 NOTE — Progress Notes (Signed)
ICD Remote  

## 2019-06-28 ENCOUNTER — Other Ambulatory Visit: Payer: Self-pay | Admitting: *Deleted

## 2019-06-28 ENCOUNTER — Telehealth: Payer: Self-pay | Admitting: General Practice

## 2019-06-28 DIAGNOSIS — E875 Hyperkalemia: Secondary | ICD-10-CM

## 2019-06-28 LAB — BASIC METABOLIC PANEL
BUN/Creatinine Ratio: 22 (ref 10–24)
BUN: 35 mg/dL — ABNORMAL HIGH (ref 8–27)
CO2: 22 mmol/L (ref 20–29)
Calcium: 10.5 mg/dL — ABNORMAL HIGH (ref 8.6–10.2)
Chloride: 96 mmol/L (ref 96–106)
Creatinine, Ser: 1.56 mg/dL — ABNORMAL HIGH (ref 0.76–1.27)
GFR calc Af Amer: 51 mL/min/{1.73_m2} — ABNORMAL LOW (ref >59)
GFR calc non Af Amer: 44 mL/min/{1.73_m2} — ABNORMAL LOW (ref >59)
Glucose: 371 mg/dL — ABNORMAL HIGH (ref 65–99)
Potassium: 5.4 mmol/L — ABNORMAL HIGH (ref 3.5–5.2)
Sodium: 139 mmol/L (ref 134–144)

## 2019-06-28 NOTE — Telephone Encounter (Signed)
Patient contacted this morning to review and discuss lab values.  His potassium was elevated at 5.4.  He indicated that he had been taking a potassium supplement.  I have instructed him to stop extra potassium supplementation and avoid high potassium foods.  He denied chest pain, palpitations, and lower extremity edema.  Overall, he states he feels well.  I will have a BMP repeated 07/01/2019.

## 2019-07-01 DIAGNOSIS — E875 Hyperkalemia: Secondary | ICD-10-CM | POA: Diagnosis not present

## 2019-07-02 LAB — BASIC METABOLIC PANEL
BUN/Creatinine Ratio: 27 — ABNORMAL HIGH (ref 10–24)
BUN: 34 mg/dL — ABNORMAL HIGH (ref 8–27)
CO2: 21 mmol/L (ref 20–29)
Calcium: 10.3 mg/dL — ABNORMAL HIGH (ref 8.6–10.2)
Chloride: 100 mmol/L (ref 96–106)
Creatinine, Ser: 1.27 mg/dL (ref 0.76–1.27)
GFR calc Af Amer: 65 mL/min/{1.73_m2} (ref >59)
GFR calc non Af Amer: 56 mL/min/{1.73_m2} — ABNORMAL LOW (ref >59)
Glucose: 265 mg/dL — ABNORMAL HIGH (ref 65–99)
Potassium: 5 mmol/L (ref 3.5–5.2)
Sodium: 140 mmol/L (ref 134–144)

## 2019-07-03 NOTE — Progress Notes (Signed)
Cardiology Clinic Note   Patient Name: Jordan Ross Date of Encounter: 07/04/2019  Primary Care Provider:  Redmond School, MD Primary Cardiologist:  Sanda Klein, MD  Patient Profile    Jordan Ross is a 74 y.o. male  presents today for follow-up of his ICM, coronary artery disease, essential hypertension, and hyperkalemia  Past Medical History    Past Medical History:  Diagnosis Date  . Abdominal aortic aneurysm ([place])   . AICD (automatic cardioverter/defibrillator) present    St Jude  . Anemia    iron deficiency anemia,takes iron pill daily  . Arthritis   . Coronary artery disease    AWMI 2001  . Diverticulosis   . GERD (gastroesophageal reflux disease)    takes Protonix daily  . History of colon polyps    benign  . History of migraine 40 yrs ago  . History of shingles   . Hyperlipidemia    takes Atorvastatin and Niacin daily  . Hypertension    takes Diovan,Spironolactone, and Carvedilol daily  . ICD (implantable cardioverter-defibrillator), single, in situ   . Ischemic cardiomyopathy   . Joint swelling   . LV dysfunction    takes Digoxin daily  . Myocardial infarction ([place]) 2001  . Pneumonia 40 yrs ago   hx of  . Shortness of breath dyspnea    with exertion.States he was a smoker for yr  . Status post coronary artery bypass grafting   . Type 2 diabetes mellitus (H[facility])    takes Glipizide,Metformin,Invokana,and Lantus daily.Average fasting blood sugar runs about 140   Past Surgical History:  Procedure Laterality Date  . ABDOMINAL AORTIC ENDOVASCULAR STENT GRAFT N/A 05/22/2015   Procedure: ABDOMINAL AORTIC ENDOVASCULAR STENT GRAFT;  Surgeon: Serafina Mitchell, MD;  Location: [facility];  Service: Vascular;  Laterality: N/A;  . CARDIAC CATHETERIZATION  2001  . COLONOSCOPY N/A 03/21/2014   Procedure: COLONOSCOPY;  Surgeon: Rogene Houston, MD;  [facility];  Service: Endoscopy;  Laterality: N/A;  855 - moved to 12/4 @ 8:30 - Ann notified pt  .  CORONARY ARTERY BYPASS GRAFT  2001   unsure if it was 3 or 4  . DOPPLER ECHOCARDIOGRAPHY     2011 38% EF.2013 EF 30-35%    Allergies  No Known Allergies  History of Present Illness    Jordan Ross has a past medical history of coronary artery disease, ischemic cardiomyopathy, essential hypertension, type 2 diabetes, abdominal aortic aneurysm, chronic systolic heart failure, ICD, and hyperlipidemia.  EVAR-AAA 2/17.  Via Dr. Trula Slade 01/2017.  He had an anterior MI 2001 treated with coronary bypass grafting by Dr. Rodolph Bong, SVG to ramus, circumflex, and PDA.  His EF at that time was 35%.  He underwent ICD implantation for primary prevention 10/10.  When he was last seen by Dr. Sallyanne Kuster  03/28/2018 he had lost weight.  His PCP had seen him the week prior for increased work of breathing with exertion.  A chest x-ray was ordered and suggested pulmonary venous congestion.  He was prescribed two doses of furosemide and showed improvement.  His spironolactone, valsartan, and carvedilol were continued.  He was feeling much better at his follow-up with Dr. Sallyanne Kuster.  At that time he denied orthopnea, PND, and indicated that he had not had any lower extremity edema.  His single-chamber [place] ICD was interrogated and showed a dip in his thoracic impedance consistent with volume overload throughout November.  That showed a rapid improvement over the days prior  to his cardiology visit.  It was noted that his thoracic impedance had always been fairly labile and was not always correlated well with his clinical presentation.  At that time it was estimated that his device longevity was over 3 years and his lead parameters were excellent.  No VT/VF was seen.  He presented to the emergency department on 06/19/2019 at the direction of his PCP.  He received 3 doses of Lasix.  His shortness of breath resolved.  His BNP was 1000, creatinine 1.26, BUN 31, high-sensitivity troponins 40 and 41.  He was discharged  home.  He was seen virtually on 06/21/2019 for follow-up and stated he felt much better since being to the emergency department.  He continued to take Lasix 20 mg a day.  He stated he was breathing much better, had much less lower extremity edema, and his weight was 171 pounds.  He was 180 pounds in the emergency department on 06/19/2019.  He stated that he was not following a low-sodium diet however, he was now choosing low-sodium options.  I had him continue to weigh daily, and gave him the salty 6 sheeet.  His repeat BMP showed hyperkalemia.  He was instructed to stop extra potassium supplementation and his follow-up BMP showed a potassium of 5.  He presents today for follow-up evaluation and states he feels well.  He is continue to eat a low-sodium diet and is also been avoiding high potassium foods.  His weight is down to 168 today.  He has been fairly sedentary at home and states that in the winter he does not do any outdoor activities.  I will give him salty 6 information sheet, ask him to continue to do daily weights and have him follow-up Dr. Sallyanne Kuster in 3 months.  Today he denies chest pain, shortness of breath, increased lower extremity edema, fatigue, palpitations, melena, hematuria, hemoptysis, diaphoresis, weakness, presyncope, syncope, orthopnea, and PND.  Home Medications    Prior to Admission medications   Medication Sig Start Date End Date Taking? Authorizing Provider  albuterol (VENTOLIN HFA) 108 (90 Base) MCG/ACT inhaler INHALE 1 OR 2 PUFFS INTOFTHE LUNGS EVERY 4 HOURS AS NEEDED 06/07/19   [provider]  aspirin 81 MG tablet Take 81 mg by mouth daily.     [provider]  atorvastatin (LIPITOR) 40 MG tablet Take 40 mg by mouth daily. 03/25/13   [provider]  carvedilol (COREG) 12.5 MG tablet Take 12.5 mg by mouth 2 (two) times daily. 03/25/13   [provider]  digoxin (LANOXIN) 0.125 MG tablet Take 125 mcg by mouth daily. 03/25/13   [provider]  FORA LANCETS MISC Test bid as directed. E11.65. 04/08/16   Cassandria Anger, MD  furosemide (LASIX) 20 MG tablet Take 20 mg by mouth daily. 06/17/19   [provider]  glucose blood (FORA V10 BLOOD GLUCOSE TEST) test strip Use as instructed bid. E11.65 05/25/17   Cassandria Anger, MD  glucose blood test strip Use as instructed 06/25/15   Cassandria Anger, MD  Insulin Glargine (TOUJEO SOLOSTAR) 300 UNIT/ML SOPN Inject 70 Units into the skin at bedtime. Patient not taking: Reported on 06/21/2019 05/25/17   Cassandria Anger, MD  IRON PO Take 65 mg by mouth daily.    [provider]  LANCETS ULTRA THIN MISC Test BG every morning 02/26/16   Cassandria Anger, MD  LITETOUCH PEN NEEDLES 31G X 8 MM MISC USE AT BEDTIME AS DIRECTED. 12/14/15  Cassandria Anger, MD  metFORMIN (GLUCOPHAGE) 500 MG tablet Take 1,000 mg by mouth 2 (two) times daily with a meal.  03/25/13   [provider]  pantoprazole (PROTONIX) 40 MG tablet Take 40 mg by mouth daily. 03/25/13   [provider]  spironolactone (ALDACTONE) 25 MG tablet Take 25 mg by mouth daily. 03/25/13   [provider]  valsartan (DIOVAN) 160 MG tablet Take 160 mg by mouth daily. 03/25/13   [provider]    Family History    Family History  Problem Relation Age of Onset  . COPD Mother   . Heart disease Brother    He indicated that his mother is deceased. He indicated that his father is deceased. He indicated that his sister is alive. He indicated that his brother is deceased.  Social History    Social History   Socioeconomic History  . Marital status: Widowed    Spouse name: Not on file  . Number of children: Not on file  . Years of education: Not on file  . Highest education level: Not on file  Occupational History  . Not on file  Tobacco Use  . Smoking status: Former Smoker    Years: 40.00  . Smokeless tobacco: Never Used  . Tobacco comment: quit  smoking in 2001  Substance and Sexual Activity  . Alcohol use: No  . Drug use: No  . Sexual activity: Yes  Other Topics Concern  . Not on file  Social History Narrative  . Not on file   Social Determinants of Health   Financial Resource Strain:   . Difficulty of Paying Living Expenses:   Food Insecurity:   . Worried About Charity fundraiser in the Last Year:   . Arboriculturist in the Last Year:   Transportation Needs:   . Film/video editor (Medical):   [name] Lack of Transportation (Non-Medical):   Physical Activity:   . Days of Exercise per Week:   . Minutes of Exercise per Session:   Stress:   . Feeling of Stress :   Social Connections:   . Frequency of Communication with Friends and Family:   . Frequency of Social Gatherings with Friends and Family:   . Attends Religious Services:   . Active Member of Clubs or Organizations:   . Attends Archivist Meetings:   [name] Marital Status:   Intimate Partner Violence:   . Fear of Current or Ex-Partner:   . Emotionally Abused:   [name] Physically Abused:   . Sexually Abused:      Review of Systems    General:  No chills, fever, night sweats or weight changes.  Cardiovascular:  No chest pain, dyspnea on exertion, edema, orthopnea, palpitations, paroxysmal nocturnal dyspnea. Dermatological: No rash, lesions/masses Respiratory: No cough, dyspnea Urologic: No hematuria, dysuria Abdominal:   No nausea, vomiting, diarrhea, bright red blood per rectum, melena, or hematemesis Neurologic:  No visual changes, wkns, changes in mental status. All other systems reviewed and are otherwise negative except as noted above.  Physical Exam    VS:  BP 116/68   Pulse 70   Temp (!) 97.5 F (36.4 C)   Ht 6' (1.829 m)   Wt 168 lb (76.2 kg)   BMI 22.78 kg/m  , BMI Body mass index is 22.78 kg/m. GEN: Well nourished, well developed, in no acute distress. HEENT: normal. Neck: Supple, no JVD, carotid bruits, or masses. Cardiac: RRR, no  murmurs, rubs, or  gallops. No clubbing, cyanosis, edema.  Radials/DP/PT 2+ and equal bilaterally.  Respiratory:  Respirations regular and unlabored, clear to auscultation bilaterally. GI: Soft, nontender, nondistended, BS + x 4. MS: no deformity or atrophy. Skin: warm and dry, no rash. Neuro:  Strength and sensation are intact. Psych: Normal affect.  Accessory Clinical Findings    ECG personally reviewed by me today-none today.  EKG 06/19/2019 Sinus rhythm with sinus arrhythmia with occasional PVCs Left axis deviation LVH with QRS widening 87 bpm  Echocardiogram 04/17/2012 Study Conclusions   - Left ventricle: The cavity size was moderately dilated.  Systolic function was moderately to severely reduced. The  estimated ejection fraction was in the range of 30% to  35%. Severe hypokinesis of the anteroseptal myocardium.  Moderate hypokinesis of the apical myocardium. Doppler  parameters are consistent with abnormal left ventricular  relaxation (grade 1 diastolic dysfunction).  - Aortic valve: Valve area: 1.05cm^2(VTI). Valve area:  0.99cm^2 (Vmax).  - Mitral valve: Calcified annulus. Mild regurgitation. Valve  area by pressure half-time: 1.48cm^2. Valve area by  continuity equation (using LVOT flow): 1.32cm^2.  - Left atrium: The atrium was mildly dilated.  - Atrial septum: No defect or patent foramen ovale was  identified.  Assessment & Plan   1.  Acute on chronic systolic heart failure-no increased work of breathing today.  Euvolemic today.   Weight today 168 pounds. Presented to the emergency department 06/19/2019 and was given three doses of Lasix.  His breathing improved and he was discharged home.  BNP was 1000 and high-sensitivity troponins were 40 and 41. Continue carvedilol 12.5 mg twice daily Continue digoxin 0.125 mg daily Continue furosemide 20 mg as needed for a weight of 3 pounds overnight or 5 pounds in a week or a weight over 170  pounds. Continue valsartan 160 mg daily Continue spironolactone 25 mg daily Heart healthy low-sodium diet-salty six given Daily weights-dry weight around 168 pounds Elevate lower extremities when not active Lower extremity support stockings-handout given.  Coronary artery disease-no chest pain today.  Status post CABG Dr. Rodolph Bong, SVG to ramus, circumflex, and PDA.  2001 Continue aspirin 81 mg tablet daily Continue atorvastatin 80 mg daily Continue carvedilol 12.5 mg twice daily Continue valsartan 160 mg daily Heart healthy low-sodium diet Increase physical activity as tolerated  Essential hypertension-BP today 116/68. Continue carvedilol 12.5 mg daily Continue furosemide 20 mg as needed for a weight of 3 pounds overnight or 5 pounds in a week or a weight over 170 pounds. Continue spironolactone 25 mg daily Continue valsartan 160 mg daily Heart healthy low-sodium diet Increase physical activity as tolerated  ICD-Saint Jude single-chamber implanted 10/10.  Last remote transmission on 03/27/2019.  29 months remaining battery longevity.  Thoracic impedance noted to not always be a good indicator of clinical status. Continue to monitor  AAA status post EVAR-followed by Dr. Trula Slade.  Last evaluated 03/18/2019 and remained stable/unchanged.  Disposition: Follow-up with Dr. Sallyanne Kuster in 3 months.  Jossie Ng. [facility] [address] [address] Office [phone] Fax [phone]

## 2019-07-04 ENCOUNTER — Encounter: Payer: Self-pay | Admitting: General Practice

## 2019-07-04 ENCOUNTER — Other Ambulatory Visit: Payer: Self-pay

## 2019-07-04 ENCOUNTER — Ambulatory Visit: Payer: Medicare Other | Admitting: General Practice

## 2019-07-04 VITALS — BP 116/68 | HR 70 | Temp 97.5°F | Ht 72.0 in | Wt 168.0 lb

## 2019-07-04 DIAGNOSIS — Z95810 Presence of automatic (implantable) cardiac defibrillator: Secondary | ICD-10-CM | POA: Diagnosis not present

## 2019-07-04 DIAGNOSIS — I714 Abdominal aortic aneurysm, without rupture, unspecified: Secondary | ICD-10-CM

## 2019-07-04 DIAGNOSIS — I10 Essential (primary) hypertension: Secondary | ICD-10-CM | POA: Diagnosis not present

## 2019-07-04 DIAGNOSIS — I5023 Acute on chronic systolic (congestive) heart failure: Secondary | ICD-10-CM | POA: Diagnosis not present

## 2019-07-04 DIAGNOSIS — I25810 Atherosclerosis of coronary artery bypass graft(s) without angina pectoris: Secondary | ICD-10-CM

## 2019-07-04 NOTE — Progress Notes (Signed)
Thanks

## 2019-07-04 NOTE — Patient Instructions (Signed)
Medication Instructions:  TAKE LASIX AS NEEDED- IF YOUR WEIGHT IS >3 POUNDS DAILY OR >5 POUNDS IN ONE WEEK, OR IF YOUR WEIGHT IS >170 POUNDS. PLEASE CALL OUR OFFICE TO LET us KNOW WHEN YOU HAVE INCREASED THESE MEDICATIONS [id number].  If you need a refill on your cardiac medications before your next appointment, please call your pharmacy.  Special Instructions: PLEASE TAKE AND LOG YOUR WEIGHT DAILY  PLEASE READ AND FOLLOW POTASSIUM FOODS-AVOID HIGH POTASSIUM FOODS  PLEASE READ AND FOLLOW SALTY 6 ATTACHED  Follow-Up: 3 months   In Person Jordan Klein, MD or [name], MD.    At [facility], you and your health needs are our priority.  As part of our continuing mission to provide you with exceptional heart care, we have created designated Provider Care Teams.  These Care Teams include your primary Cardiologist (physician) and Advanced Practice Providers (APPs -  Physician Assistants and Nurse Practitioners) who all work together to provide you with the care you need, when you need it.  Reduce your risk of getting COVID-19 With your heart disease it is especially important for people at increased risk of severe illness from COVID-19, and those who live with them, to protect themselves from getting COVID-19. The best way to protect yourself and to help reduce the spread of the virus that causes COVID-19 is to: [name] Limit your interactions with other people as much as possible. . Take COVID-19 when you do interact with others. If you start feeling sick and think you may have COVID-19, get in touch with your healthcare provider within 24 hours.  Thank you for choosing [facility]!!        Potassium Content of Foods  Potassium is a mineral found in many foods and drinks. It affects how the heart works, and helps keep fluids and minerals balanced in the body. The amount of potassium you need each day depends on your age and any medical conditions you may have. Talk to  your health care provider or dietitian about how much potassium you need. The following lists of foods provide the general serving size for foods and the approximate amount of potassium in each serving, listed in milligrams (mg). Actual values may vary depending on the product and how it is processed. High in potassium The following foods and beverages have 200 mg or more of potassium per serving:  Apricots (raw) - 2 have 200 mg of potassium.  Apricots (dry) - 5 have 200 mg of potassium.  Artichoke - 1 medium has 345 mg of potassium.  Avocado -  fruit has 245 mg of potassium.  Banana - 1 medium fruit has 425 mg of potassium.  [place] or baked beans (canned) -  cup has 280 mg of potassium.  White beans (canned) -  cup has 595 mg potassium.  Beef roast - 3 oz has 320 mg of potassium.  Ground beef - 3 oz has 270 mg of potassium.  Beets (raw or cooked) -  cup has 260 mg of potassium.  Bran muffin - 2 oz has 300 mg of potassium.  Broccoli (cooked) -  cup has 230 mg of potassium.  Brussels sprouts -  cup has 250 mg of potassium.  Cantaloupe -  cup has 215 mg of potassium.  Cereal, 100% bran -  cup has 200-400 mg of potassium.  Cheeseburger -1 single fast food burger has 225-400 mg of potassium.  Chicken - 3 oz has 220 mg of potassium.  Clams (canned) -  3 oz has 535 mg of potassium.  Crab - 3 oz has 225 mg of potassium.  Dates - 5 have 270 mg of potassium.  Dried beans and peas -  cup has 300-475 mg of potassium.  Figs (dried) - 2 have 260 mg of potassium.  Fish (halibut, tuna, cod, snapper) - 3 oz has 480 mg of potassium.  Fish (salmon, haddock, swordfish, perch) - 3 oz has 300 mg of potassium.  Fish (tuna, canned) - 3 oz has 200 mg of potassium.  [place] fries (fast food) - 3 oz has 470 mg of potassium.  Granola with fruit and nuts -  cup has 200 mg of potassium.  Grapefruit juice -  cup has 200 mg of potassium.  Honeydew melon -  cup has 200 mg of  potassium.  Kale (raw) - 1 cup has 300 mg of potassium.  Kiwi - 1 medium fruit has 240 mg of potassium.  Kohlrabi, rutabaga, parsnips -  cup has 280 mg of potassium.  Lentils -  cup has 365 mg of potassium.  Mango - 1 each has 325 mg of potassium.  Milk (nonfat, low-fat, whole, buttermilk) - 1 cup has 350-380 mg of potassium.  Milk (chocolate) - 1 cup has 420 mg of potassium  Molasses - 1 Tbsp has 295 mg of potassium.  Mushrooms -  cup has 280 mg of potassium.  Nectarine - 1 each has 275 mg of potassium.  Nuts (almonds, peanuts, hazelnuts, [place], cashew, mixed) - 1 oz has 200 mg of potassium.  Nuts (pistachios) - 1 oz has 295 mg of potassium.  Orange - 1 fruit has 240 mg of potassium.  Orange juice -  cup has 235 mg of potassium.  Papaya -  medium fruit has 390 mg of potassium.  Peanut butter (chunky) - 2 Tbsp has 240 mg of potassium.  Peanut butter (smooth) - 2 Tbsp has 210 mg of potassium.  Pear - 1 medium (200 mg) of potassium.  Pomegranate - 1 whole fruit has 400 mg of potassium.  Pomegranate juice -  cup has 215 mg of potassium.  Pork - 3 oz has 350 mg of potassium.  Potato chips (salted) - 1 oz has 465 mg of potassium.  Potato (baked with skin) - 1 medium has 925 mg of potassium.  Potato (boiled) -  cup has 255 mg of potassium.  Potato (Mashed) -  cup has 330 mg of potassium.  Prune juice -  cup has 370 mg of potassium.  Prunes - 5 have 305 mg of potassium.  Pudding (chocolate) -  cup has 230 mg of potassium.  Pumpkin (canned) -  cup has 250 mg of potassium.  Raisins (seedless) -  cup has 270 mg of potassium.  Seeds (sunflower or pumpkin) - 1 oz has 240 mg of potassium.  Soy milk - 1 cup has 300 mg of potassium.  Spinach (cooked) - 1/2 cup has 420 mg of potassium.  Spinach (canned) -  cup has 370 mg of potassium.  Sweet potato (baked with skin) - 1 medium has 450 mg of potassium.  Swiss chard -  cup has 480 mg of  potassium.  Tomato or vegetable juice -  cup has 275 mg of potassium.  Tomato (sauce or puree) -  cup has 400-550 mg of potassium.  Tomato (raw) - 1 medium has 290 mg of potassium.  Tomato (canned) -  cup has 200-300 mg of potassium.  [place] - 3 oz has 250 mg  of potassium.  Wheat germ - 1 oz has 250 mg of potassium.  Winter squash -  cup has 250 mg of potassium.  Yogurt (plain or fruited) - 6 oz has 260-435 mg of potassium.  Zucchini -  cup has 220 mg of potassium. Moderate in potassium The following foods and beverages have 50-200 mg of potassium per serving:  Apple - 1 fruit has 150 mg of potassium  Apple juice -  cup has 150 mg of potassium  Applesauce -  cup has 90 mg of potassium  Apricot nectar -  cup has 140 mg of potassium  Asparagus (small spears) -  cup has 155 mg of potassium  Asparagus (large spears) - 6 have 155 mg of potassium  Bagel (cinnamon raisin) - 1 four-inch bagel has 130 mg of potassium  Bagel (egg or plain) - 1 four- inch bagel has 70 mg of potassium  Beans (green) -  cup has 90 mg of potassium  Beans (yellow) -  cup has 190 mg of potassium  Beer, regular - 12 oz has 100 mg of potassium  Beets (canned) -  cup has 125 mg of potassium  Blackberries -  cup has 115 mg of potassium  Blueberries -  cup has 60 mg of potassium  Bread (whole wheat) - 1 slice has 70 mg of potassium  Broccoli (raw) -  cup has 145 mg of potassium  Cabbage -  cup has 150 mg of potassium  Carrots (cooked or raw) -  cup has 180 mg of potassium  Cauliflower (raw) -  cup has 150 mg of potassium  Celery (raw) -  cup has 155 mg of potassium  Cereal, bran flakes -  cup has 120-150 mg of potassium  Cheese (cottage) -  cup has 110 mg of potassium  Cherries - 10 have 150 mg of potassium  Chocolate - 1 oz bar has 165 mg of potassium  Coffee (brewed) - 6 oz has 90 mg of potassium  Corn -  cup or 1 ear has 195 mg of potassium  Cucumbers -   cup has 80 mg of potassium  Egg - 1 large egg has 60 mg of potassium  Eggplant -  cup has 60 mg of potassium  Endive (raw) -  cup has 80 mg of potassium  English muffin - 1 has 65 mg of potassium  Fish (ocean perch) - 3 oz has 192 mg of potassium  Frankfurter, beef or pork - 1 has 75 mg of potassium  Fruit cocktail -  cup has 115 mg of potassium  Grape juice -  cup has 170 mg of potassium  Grapefruit -  fruit has 175 mg of potassium  Grapes -  cup has 155 mg of potassium  Greens: kale, turnip, collard -  cup has 110-150 mg of potassium  Ice cream or frozen yogurt (chocolate) -  cup has 175 mg of potassium  Ice cream or frozen yogurt (vanilla) -  cup has 120-150 mg of potassium  Lemons, limes - 1 each has 80 mg of potassium  Lettuce - 1 cup has 100 mg of potassium  Mixed vegetables -  cup has 150 mg of potassium  Mushrooms, raw -  cup has 110 mg of potassium  Nuts (walnuts, pecans, or macadamia) - 1 oz has 125 mg of potassium  Oatmeal -  cup has 80 mg of potassium  Okra -  cup has 110 mg of potassium  Onions -  cup has 120  mg of potassium  Peach - 1 has 185 mg of potassium  Peaches (canned) -  cup has 120 mg of potassium  Pears (canned) -  cup has 120 mg of potassium  Peas, green (frozen) -  cup has 90 mg of potassium  Peppers (Green) -  cup has 130 mg of potassium  Peppers (Red) -  cup has 160 mg of potassium  Pineapple juice -  cup has 165 mg of potassium  Pineapple (fresh or canned) -  cup has 100 mg of potassium  Plums - 1 has 105 mg of potassium  Pudding, vanilla -  cup has 150 mg of potassium  Raspberries -  cup has 90 mg of potassium  Rhubarb -  cup has 115 mg of potassium  Rice, wild -  cup has 80 mg of potassium  Shrimp - 3 oz has 155 mg of potassium  Spinach (raw) - 1 cup has 170 mg of potassium  Strawberries -  cup has 125 mg of potassium  Summer squash -  cup has 175-200 mg of potassium  Swiss chard  (raw) - 1 cup has 135 mg of potassium  Tangerines - 1 fruit has 140 mg of potassium  Tea, brewed - 6 oz has 65 mg of potassium  Turnips -  cup has 140 mg of potassium  Watermelon -  cup has 85 mg of potassium  Wine (Red, table) - 5 oz has 180 mg of potassium  Wine (White, table) - 5 oz 100 mg of potassium Low in potassium The following foods and beverages have less than 50 mg of potassium per serving.  Bread (white) - 1 slice has 30 mg of potassium  Carbonated beverages - 12 oz has less than 5 mg of potassium  Cheese - 1 oz has 20-30 mg of potassium  Cranberries -  cup has 45 mg of potassium  Cranberry juice cocktail -  cup has 20 mg of potassium  Fats and oils - 1 Tbsp has less than 5 mg of potassium  Hummus - 1 Tbsp has 32 mg of potassium  Nectar (papaya, mango, or pear) -  cup has 35 mg of potassium  Rice (white or brown) -  cup has 50 mg of potassium  Spaghetti or macaroni (cooked) -  cup has 30 mg of potassium  [place]illa, flour or corn - 1 has 50 mg of potassium  Waffle - 1 four-inch waffle has 50 mg of potassium  Water chestnuts -  cup has 40 mg of potassium Summary  Potassium is a mineral found in many foods and drinks. It affects how the heart works, and helps keep fluids and minerals balanced in the body.  The amount of potassium you need each day depends on your age and any existing medical conditions you may have. Your health care provider or dietitian may recommend an amount of potassium that you should have each day. This information is not intended to replace advice given to you by your health care provider. Make sure you discuss any questions you have with your health care provider.

## 2019-07-11 DIAGNOSIS — Z6824 Body mass index (BMI) 24.0-24.9, adult: Secondary | ICD-10-CM | POA: Diagnosis not present

## 2019-07-11 DIAGNOSIS — E118 Type 2 diabetes mellitus with unspecified complications: Secondary | ICD-10-CM | POA: Diagnosis not present

## 2019-07-11 DIAGNOSIS — J9600 Acute respiratory failure, unspecified whether with hypoxia or hypercapnia: Secondary | ICD-10-CM | POA: Diagnosis not present

## 2019-07-11 DIAGNOSIS — I509 Heart failure, unspecified: Secondary | ICD-10-CM | POA: Diagnosis not present

## 2019-07-11 DIAGNOSIS — K219 Gastro-esophageal reflux disease without esophagitis: Secondary | ICD-10-CM | POA: Diagnosis not present

## 2019-07-12 ENCOUNTER — Other Ambulatory Visit: Payer: Self-pay

## 2019-07-12 ENCOUNTER — Telehealth: Payer: Self-pay | Admitting: Internal Medicine

## 2019-07-12 ENCOUNTER — Emergency Department (HOSPITAL_COMMUNITY): Payer: Medicare Other

## 2019-07-12 ENCOUNTER — Inpatient Hospital Stay (HOSPITAL_COMMUNITY)
Admission: EM | Admit: 2019-07-12 | Discharge: 2019-07-29 | DRG: 286 | Disposition: A | Discharge status: Home Health | Payer: Medicare Other | Source: Ambulatory Visit | Attending: Internal Medicine | Admitting: Internal Medicine

## 2019-07-12 ENCOUNTER — Encounter (HOSPITAL_COMMUNITY): Payer: Self-pay | Admitting: Pediatrics

## 2019-07-12 DIAGNOSIS — E785 Hyperlipidemia, unspecified: Secondary | ICD-10-CM | POA: Diagnosis present

## 2019-07-12 DIAGNOSIS — I4819 Other persistent atrial fibrillation: Secondary | ICD-10-CM | POA: Diagnosis present

## 2019-07-12 DIAGNOSIS — I5022 Chronic systolic (congestive) heart failure: Secondary | ICD-10-CM | POA: Diagnosis not present

## 2019-07-12 DIAGNOSIS — I428 Other cardiomyopathies: Secondary | ICD-10-CM | POA: Diagnosis not present

## 2019-07-12 DIAGNOSIS — R0600 Dyspnea, unspecified: Secondary | ICD-10-CM | POA: Diagnosis not present

## 2019-07-12 DIAGNOSIS — I255 Ischemic cardiomyopathy: Secondary | ICD-10-CM | POA: Diagnosis not present

## 2019-07-12 DIAGNOSIS — I5021 Acute systolic (congestive) heart failure: Secondary | ICD-10-CM | POA: Diagnosis not present

## 2019-07-12 DIAGNOSIS — I509 Heart failure, unspecified: Secondary | ICD-10-CM

## 2019-07-12 DIAGNOSIS — I340 Nonrheumatic mitral (valve) insufficiency: Secondary | ICD-10-CM | POA: Diagnosis not present

## 2019-07-12 DIAGNOSIS — Z79899 Other long term (current) drug therapy: Secondary | ICD-10-CM

## 2019-07-12 DIAGNOSIS — I110 Hypertensive heart disease with heart failure: Secondary | ICD-10-CM | POA: Diagnosis not present

## 2019-07-12 DIAGNOSIS — Z825 Family history of asthma and other chronic lower respiratory diseases: Secondary | ICD-10-CM

## 2019-07-12 DIAGNOSIS — Z8249 Family history of ischemic heart disease and other diseases of the circulatory system: Secondary | ICD-10-CM

## 2019-07-12 DIAGNOSIS — Z794 Long term (current) use of insulin: Secondary | ICD-10-CM

## 2019-07-12 DIAGNOSIS — Z452 Encounter for adjustment and management of vascular access device: Secondary | ICD-10-CM

## 2019-07-12 DIAGNOSIS — E875 Hyperkalemia: Secondary | ICD-10-CM | POA: Diagnosis not present

## 2019-07-12 DIAGNOSIS — I5023 Acute on chronic systolic (congestive) heart failure: Secondary | ICD-10-CM

## 2019-07-12 DIAGNOSIS — I25810 Atherosclerosis of coronary artery bypass graft(s) without angina pectoris: Secondary | ICD-10-CM | POA: Diagnosis not present

## 2019-07-12 DIAGNOSIS — I714 Abdominal aortic aneurysm, without rupture: Secondary | ICD-10-CM | POA: Diagnosis present

## 2019-07-12 DIAGNOSIS — Z7982 Long term (current) use of aspirin: Secondary | ICD-10-CM | POA: Diagnosis not present

## 2019-07-12 DIAGNOSIS — K5900 Constipation, unspecified: Secondary | ICD-10-CM | POA: Diagnosis present

## 2019-07-12 DIAGNOSIS — I350 Nonrheumatic aortic (valve) stenosis: Secondary | ICD-10-CM | POA: Diagnosis not present

## 2019-07-12 DIAGNOSIS — E119 Type 2 diabetes mellitus without complications: Secondary | ICD-10-CM | POA: Diagnosis not present

## 2019-07-12 DIAGNOSIS — Z87891 Personal history of nicotine dependence: Secondary | ICD-10-CM | POA: Diagnosis not present

## 2019-07-12 DIAGNOSIS — Z20822 Contact with and (suspected) exposure to covid-19: Secondary | ICD-10-CM | POA: Diagnosis not present

## 2019-07-12 DIAGNOSIS — R570 Cardiogenic shock: Secondary | ICD-10-CM

## 2019-07-12 DIAGNOSIS — R0602 Shortness of breath: Secondary | ICD-10-CM | POA: Diagnosis not present

## 2019-07-12 DIAGNOSIS — I4892 Unspecified atrial flutter: Secondary | ICD-10-CM | POA: Diagnosis present

## 2019-07-12 DIAGNOSIS — T462X5A Adverse effect of other antidysrhythmic drugs, initial encounter: Secondary | ICD-10-CM | POA: Diagnosis present

## 2019-07-12 DIAGNOSIS — Z791 Long term (current) use of non-steroidal anti-inflammatories (NSAID): Secondary | ICD-10-CM

## 2019-07-12 DIAGNOSIS — N183 Chronic kidney disease, stage 3 unspecified: Secondary | ICD-10-CM | POA: Diagnosis not present

## 2019-07-12 DIAGNOSIS — I4891 Unspecified atrial fibrillation: Secondary | ICD-10-CM | POA: Diagnosis present

## 2019-07-12 DIAGNOSIS — Z95810 Presence of automatic (implantable) cardiac defibrillator: Secondary | ICD-10-CM | POA: Diagnosis not present

## 2019-07-12 DIAGNOSIS — N179 Acute kidney failure, unspecified: Secondary | ICD-10-CM | POA: Diagnosis present

## 2019-07-12 DIAGNOSIS — I952 Hypotension due to drugs: Secondary | ICD-10-CM | POA: Diagnosis present

## 2019-07-12 DIAGNOSIS — I252 Old myocardial infarction: Secondary | ICD-10-CM

## 2019-07-12 DIAGNOSIS — I2510 Atherosclerotic heart disease of native coronary artery without angina pectoris: Secondary | ICD-10-CM | POA: Diagnosis present

## 2019-07-12 DIAGNOSIS — Z951 Presence of aortocoronary bypass graft: Secondary | ICD-10-CM | POA: Diagnosis not present

## 2019-07-12 DIAGNOSIS — L8990 Pressure ulcer of unspecified site, unspecified stage: Secondary | ICD-10-CM | POA: Insufficient documentation

## 2019-07-12 DIAGNOSIS — I5043 Acute on chronic combined systolic (congestive) and diastolic (congestive) heart failure: Secondary | ICD-10-CM | POA: Diagnosis not present

## 2019-07-12 DIAGNOSIS — N289 Disorder of kidney and ureter, unspecified: Secondary | ICD-10-CM

## 2019-07-12 DIAGNOSIS — I517 Cardiomegaly: Secondary | ICD-10-CM | POA: Diagnosis not present

## 2019-07-12 DIAGNOSIS — I959 Hypotension, unspecified: Secondary | ICD-10-CM | POA: Diagnosis not present

## 2019-07-12 DIAGNOSIS — D509 Iron deficiency anemia, unspecified: Secondary | ICD-10-CM | POA: Diagnosis present

## 2019-07-12 LAB — GLUCOSE, CAPILLARY
Glucose-Capillary: 140 mg/dL — ABNORMAL HIGH (ref 70–99)
Glucose-Capillary: 176 mg/dL — ABNORMAL HIGH (ref 70–99)

## 2019-07-12 LAB — CBC
HCT: 39.8 % (ref 39.0–52.0)
Hemoglobin: 12.9 g/dL — ABNORMAL LOW (ref 13.0–17.0)
MCH: 30.4 pg (ref 26.0–34.0)
MCHC: 32.4 g/dL (ref 30.0–36.0)
MCV: 93.9 fL (ref 80.0–100.0)
Platelets: 210 10*3/uL (ref 150–400)
RBC: 4.24 MIL/uL (ref 4.22–5.81)
RDW: 14.2 % (ref 11.5–15.5)
WBC: 8.2 10*3/uL (ref 4.0–10.5)
nRBC: 0 % (ref 0.0–0.2)

## 2019-07-12 LAB — BASIC METABOLIC PANEL
Anion gap: 16 — ABNORMAL HIGH (ref 5–15)
BUN: 48 mg/dL — ABNORMAL HIGH (ref 8–23)
CO2: 15 mmol/L — ABNORMAL LOW (ref 22–32)
Calcium: 9.4 mg/dL (ref 8.9–10.3)
Chloride: 103 mmol/L (ref 98–111)
Creatinine, Ser: 1.97 mg/dL — ABNORMAL HIGH (ref 0.61–1.24)
GFR calc Af Amer: 38 mL/min — ABNORMAL LOW (ref >60)
GFR calc non Af Amer: 33 mL/min — ABNORMAL LOW (ref >60)
Glucose, Bld: 183 mg/dL — ABNORMAL HIGH (ref 70–99)
Potassium: 4.7 mmol/L (ref 3.5–5.1)
Sodium: 134 mmol/L — ABNORMAL LOW (ref 135–145)

## 2019-07-12 LAB — BRAIN NATRIURETIC PEPTIDE: B Natriuretic Peptide: 1976.9 pg/mL — ABNORMAL HIGH (ref 0.0–100.0)

## 2019-07-12 LAB — SARS CORONAVIRUS 2 (TAT 6-24 HRS): SARS Coronavirus 2: NEGATIVE

## 2019-07-12 LAB — TROPONIN I (HIGH SENSITIVITY)
Troponin I (High Sensitivity): 150 ng/L (ref <18)
Troponin I (High Sensitivity): 156 ng/L (ref <18)

## 2019-07-12 LAB — D-DIMER, QUANTITATIVE: D-Dimer, Quant: 1.99 ug/mL-FEU — ABNORMAL HIGH (ref 0.00–0.50)

## 2019-07-12 MED ORDER — METFORMIN HCL 500 MG PO TABS: 1000.0000 mg | ORAL_TABLET | Freq: Two times a day (BID) | ORAL | Status: DC

## 2019-07-12 MED ORDER — ONDANSETRON HCL 4 MG/2ML IJ SOLN: 4.0000 mg | Freq: Four times a day (QID) | INTRAMUSCULAR | Status: DC | PRN

## 2019-07-12 MED ORDER — NITROGLYCERIN 0.4 MG SL SUBL: 0.4000 mg | SUBLINGUAL_TABLET | SUBLINGUAL | Status: DC | PRN

## 2019-07-12 MED ORDER — AMIODARONE HCL IN DEXTROSE 360-4.14 MG/200ML-% IV SOLN
30.0000 mg/h | INTRAVENOUS | Status: DC
  Administered 2019-07-12: 30 mg/h via INTRAVENOUS
  Filled 2019-07-12 (×2): qty 200

## 2019-07-12 MED ORDER — AMIODARONE HCL IN DEXTROSE 360-4.14 MG/200ML-% IV SOLN
60.0000 mg/h | INTRAVENOUS | Status: AC
  Administered 2019-07-12: 60 mg/h via INTRAVENOUS

## 2019-07-12 MED ORDER — ACETAMINOPHEN 325 MG PO TABS
650.0000 mg | ORAL_TABLET | ORAL | Status: DC | PRN
  Administered 2019-07-24: 650 mg via ORAL
  Filled 2019-07-12: qty 2

## 2019-07-12 MED ORDER — DIGOXIN 125 MCG PO TABS: 125.0000 ug | ORAL_TABLET | Freq: Every day | ORAL | Status: DC

## 2019-07-12 MED ORDER — INSULIN GLARGINE 300 UNIT/ML ~~LOC~~ SOPN: 70.0000 [IU] | PEN_INJECTOR | Freq: Every day | SUBCUTANEOUS | Status: DC

## 2019-07-12 MED ORDER — ALBUTEROL SULFATE (2.5 MG/3ML) 0.083% IN NEBU: 2.5000 mg | INHALATION_SOLUTION | RESPIRATORY_TRACT | Status: DC | PRN

## 2019-07-12 MED ORDER — APIXABAN 5 MG PO TABS
5.0000 mg | ORAL_TABLET | Freq: Two times a day (BID) | ORAL | Status: DC
  Administered 2019-07-12 – 2019-07-13 (×3): 5 mg via ORAL
  Filled 2019-07-12 (×4): qty 1

## 2019-07-12 MED ORDER — FUROSEMIDE 10 MG/ML IJ SOLN
40.0000 mg | Freq: Once | INTRAMUSCULAR | Status: AC
  Administered 2019-07-12: 40 mg via INTRAVENOUS
  Filled 2019-07-12: qty 4

## 2019-07-12 MED ORDER — PANTOPRAZOLE SODIUM 40 MG PO TBEC
40.0000 mg | DELAYED_RELEASE_TABLET | Freq: Every day | ORAL | Status: DC
  Administered 2019-07-13 – 2019-07-29 (×17): 40 mg via ORAL
  Filled 2019-07-12 (×17): qty 1

## 2019-07-12 MED ORDER — SODIUM CHLORIDE 0.9% FLUSH
3.0000 mL | Freq: Once | INTRAVENOUS | Status: AC
  Administered 2019-07-21: 3 mL via INTRAVENOUS

## 2019-07-12 MED ORDER — ATORVASTATIN CALCIUM 40 MG PO TABS
40.0000 mg | ORAL_TABLET | Freq: Every day | ORAL | Status: DC
  Administered 2019-07-13 – 2019-07-29 (×17): 40 mg via ORAL
  Filled 2019-07-12 (×17): qty 1

## 2019-07-12 MED ORDER — ASPIRIN 81 MG PO CHEW
81.0000 mg | CHEWABLE_TABLET | Freq: Every day | ORAL | Status: DC
  Administered 2019-07-13 – 2019-07-28 (×16): 81 mg via ORAL
  Filled 2019-07-12 (×16): qty 1

## 2019-07-12 MED ORDER — FUROSEMIDE 10 MG/ML IJ SOLN
40.0000 mg | Freq: Every day | INTRAMUSCULAR | Status: DC
  Administered 2019-07-12: 40 mg via INTRAVENOUS
  Filled 2019-07-12: qty 4

## 2019-07-12 MED ORDER — INSULIN GLARGINE 100 UNIT/ML ~~LOC~~ SOLN
30.0000 [IU] | Freq: Every day | SUBCUTANEOUS | Status: DC
  Administered 2019-07-12 – 2019-07-22 (×10): 30 [IU] via SUBCUTANEOUS
  Filled 2019-07-12 (×14): qty 0.3

## 2019-07-12 NOTE — Telephone Encounter (Signed)
Lovena Le from Dr. Nolon Rod office calling stating he wants to speak with DOD about the patient. Gave his number to the nurse.

## 2019-07-12 NOTE — ED Notes (Signed)
Pacemaker interrogated. 

## 2019-07-12 NOTE — ED Notes (Signed)
[employer] called with ICD interrogation information. They could not tell the rhythm pt is in due to ICD being single chamber. Thinking fluid overload has been progressing over the last 8 days d/t the alert first appearing 3/18. Although they could not say for sure.

## 2019-07-12 NOTE — ED Notes (Signed)
Initial Sp02 less than 90%; patient placed on 2L via [place]

## 2019-07-12 NOTE — ED Provider Notes (Signed)
[facility] EMERGENCY DEPARTMENT Provider Note   CSN: [id number] Arrival date & time: 07/12/19  1239     History Chief Complaint  Patient presents with  . Shortness of Breath    Jordan Ross is a 74 y.o. male w/ vascular history as described in [place] cleaver's note on 3/18 (patient cannot recall details):  "history of coronary artery disease, ischemic cardiomyopathy, essential hypertension, type 2 diabetes, abdominal aortic aneurysm, chronic systolic heart failure, ICD, and hyperlipidemia.EVAR-AAA 2/17. Via Dr. Trula Slade 01/2017. He had an anterior MI 2001 treated with coronary bypass grafting by Dr. Ladona Mow, SVG to ramus, circumflex, and PDA. His EF at that time was 35%. He underwent St. Jude ICD implantation for primary prevention."  Presenting here with progressively worsening shortness of breath for 1 month.  No acute changes today.  No chest pain or lightheadedness.  No hx of DVT or PE he is aware of.    Concern from Dr Lyman Bishop his cardiology on the phone today that the patient may be suffering from decompensated CHF with AKI and hyperK, advised to come to the ER.  HPI     Past Medical History:  Diagnosis Date  . Abdominal aortic aneurysm ([place])   . AICD (automatic cardioverter/defibrillator) present    St Jude  . Anemia    iron deficiency anemia,takes iron pill daily  . Arthritis   . Coronary artery disease    AWMI 2001  . Diverticulosis   . GERD (gastroesophageal reflux disease)    takes Protonix daily  . History of colon polyps    benign  . History of migraine 40 yrs ago  . History of shingles   . Hyperlipidemia    takes Atorvastatin and Niacin daily  . Hypertension    takes Diovan,Spironolactone, and Carvedilol daily  . ICD (implantable cardioverter-defibrillator), single, in situ   . Ischemic cardiomyopathy   . Joint swelling   . LV dysfunction    takes Digoxin daily  . Myocardial infarction ([place]) 2001  . Pneumonia  40 yrs ago   hx of  . Shortness of breath dyspnea    with exertion.States he was a smoker for yr  . Status post coronary artery bypass grafting   . Type 2 diabetes mellitus (HCC)    takes Glipizide,Metformin,Invokana,and Lantus daily.Average fasting blood sugar runs about 140    Patient Active Problem List   Diagnosis Date Noted  . Atrial fibrillation with RVR ([place]) 07/12/2019  . Chronic systolic heart failure ([place]) 12/17/2015  . AAA (abdominal aortic aneurysm) ([place]) 05/22/2015  . Coronary artery disease 03/29/2013  . Cardiomyopathy, ischemic 03/29/2013  . ICD (implantable cardioverter-defibrillator), single, in situ 03/29/2013  . Essential hypertension 03/29/2013  . Hyperlipidemia 03/29/2013  . Type 2 diabetes mellitus with vascular disease ([place]) 03/29/2013  . Abdominal aortic aneurysm ([place]) 03/29/2013  . Bilateral renal artery stenosis ([place]) 03/29/2013    Past Surgical History:  Procedure Laterality Date  . ABDOMINAL AORTIC ENDOVASCULAR STENT GRAFT N/A 05/22/2015   Procedure: ABDOMINAL AORTIC ENDOVASCULAR STENT GRAFT;  Surgeon: Serafina Mitchell, MD;  Location: [facility];  Service: Vascular;  Laterality: N/A;  . CARDIAC CATHETERIZATION  2001  . COLONOSCOPY N/A 03/21/2014   Procedure: COLONOSCOPY;  Surgeon: Rogene Houston, MD;  [facility];  Service: Endoscopy;  Laterality: N/A;  855 - moved to 12/4 @ 8:30 - Ann notified pt  . CORONARY ARTERY BYPASS GRAFT  2001   unsure if it was 3 or 4  . DOPPLER  ECHOCARDIOGRAPHY     2011 38% EF.2013 EF 30-35%       Family History  Problem Relation Age of Onset  . COPD Mother   . Heart disease Brother     Social History   Tobacco Use  . Smoking status: Former Smoker    Years: 40.00  . Smokeless tobacco: Never Used  . Tobacco comment: quit smoking in 2001  Substance Use Topics  . Alcohol use: No  . Drug use: No    Home Medications Prior to Admission medications   Medication Sig Start Date End Date Taking? Authorizing  Provider  albuterol (VENTOLIN HFA) 108 (90 Base) MCG/ACT inhaler Inhale 1-2 puffs into the lungs every 4 (four) hours as needed for wheezing or shortness of breath.  06/07/19   [provider]  aspirin 81 MG tablet Take 81 mg by mouth daily.     [provider]  atorvastatin (LIPITOR) 40 MG tablet Take 40 mg by mouth daily. 03/25/13   [provider]  carvedilol (COREG) 12.5 MG tablet Take 12.5 mg by mouth 2 (two) times daily. 03/25/13   [provider]  digoxin (LANOXIN) 0.125 MG tablet Take 125 mcg by mouth daily. 03/25/13   [provider]  ENTRESTO 49-51 MG Take 1 tablet by mouth 2 (two) times daily. 07/11/19   [provider]  FORA LANCETS MISC Test bid as directed. E11.65. Patient taking differently: 1 each by Other route in the morning and at bedtime.  04/08/16   Cassandria Anger, MD  furosemide (LASIX) 20 MG tablet Take 20 mg by mouth daily. 06/17/19   [provider]  glucose blood (FORA V10 BLOOD GLUCOSE TEST) test strip Use as instructed bid. E11.65 Patient taking differently: 1 each by Other route in the morning and at bedtime.  05/25/17   Cassandria Anger, MD  glucose blood test strip Use as instructed Patient taking differently: 1 each by Other route See admin instructions. Use as instructed 06/25/15   Cassandria Anger, MD  Insulin Glargine (TOUJEO SOLOSTAR) 300 UNIT/ML SOPN Inject 70 Units into the skin at bedtime. 05/25/17   Cassandria Anger, MD  IRON PO Take 65 mg by mouth daily.    [provider]  LANCETS ULTRA THIN MISC Test BG every morning Patient taking differently: 1 each by Other route in the morning.  02/26/16   Cassandria Anger, MD  LITETOUCH PEN NEEDLES 31G X 8 MM MISC USE AT BEDTIME AS DIRECTED. Patient taking differently: 1 each by Other route at bedtime.  12/14/15   Cassandria Anger, MD  metFORMIN (GLUCOPHAGE) 500 MG tablet Take 1,000 mg by mouth 2 (two) times daily with a meal.   03/25/13   [provider]  metolazone (ZAROXOLYN) 2.5 MG tablet Take 2.5 mg by mouth daily. 07/11/19   [provider]  pantoprazole (PROTONIX) 40 MG tablet Take 40 mg by mouth daily. 03/25/13   [provider]  spironolactone (ALDACTONE) 25 MG tablet Take 25 mg by mouth daily. 03/25/13   [provider]  valsartan (DIOVAN) 160 MG tablet Take 160 mg by mouth daily. 03/25/13   [provider]    Allergies    Patient has no known allergies.  Review of Systems   Review of Systems  Constitutional: Negative for chills and fever.  Eyes: Negative for pain and visual disturbance.  Respiratory: Positive for shortness of breath. Negative for cough.   Cardiovascular: Negative for chest pain and palpitations.  Gastrointestinal:  Negative for abdominal pain and vomiting.  Genitourinary: Negative for dysuria and hematuria.  Musculoskeletal: Negative for arthralgias and back pain.  Skin: Negative for color change and rash.  Neurological: Negative for seizures and syncope.  All other systems reviewed and are negative.   Physical Exam Updated Vital Signs BP (!) 77/66   Pulse (!) 141   Temp 97.6 F (36.4 C) (Oral)   Resp (!) 22   Ht 6' (1.829 m)   Wt 76.2 kg   SpO2 100%   BMI 22.78 kg/m   Physical Exam Vitals and nursing note reviewed.  Constitutional:      Appearance: He is well-developed.  HENT:     Head: Normocephalic and atraumatic.  Eyes:     Conjunctiva/sclera: Conjunctivae normal.  Cardiovascular:     Rate and Rhythm: Regular rhythm. Tachycardia present.     Heart sounds: No murmur.  Pulmonary:     Effort: Pulmonary effort is normal.     Comments: On 4l [place] (Baseline) sating 95% Crackles diffusely in lower and mid lung fields Abdominal:     Palpations: Abdomen is soft.     Tenderness: There is no abdominal tenderness.  Musculoskeletal:     Cervical back: Neck supple.  Skin:    General: Skin is warm and dry.  Neurological:      Mental Status: He is alert.     ED Results / Procedures / Treatments   Labs (all labs ordered are listed, but only abnormal results are displayed) Labs Reviewed  BASIC METABOLIC PANEL - Abnormal; Notable for the following components:      Result Value   Sodium 134 (*)    CO2 15 (*)    Glucose, Bld 183 (*)    BUN 48 (*)    Creatinine, Ser 1.97 (*)    GFR calc non Af Amer 33 (*)    GFR calc Af Amer 38 (*)    Anion gap 16 (*)    All other components within normal limits  CBC - Abnormal; Notable for the following components:   Hemoglobin 12.9 (*)    All other components within normal limits  BRAIN NATRIURETIC PEPTIDE - Abnormal; Notable for the following components:   B Natriuretic Peptide 1,976.9 (*)    All other components within normal limits  D-DIMER, QUANTITATIVE (NOT AT [facility]) - Abnormal; Notable for the following components:   D-Dimer, Quant 1.99 (*)    All other components within normal limits  GLUCOSE, CAPILLARY - Abnormal; Notable for the following components:   Glucose-Capillary 140 (*)    All other components within normal limits  TROPONIN I (HIGH SENSITIVITY) - Abnormal; Notable for the following components:   Troponin I (High Sensitivity) 150 (*)    All other components within normal limits  TROPONIN I (HIGH SENSITIVITY) - Abnormal; Notable for the following components:   Troponin I (High Sensitivity) 156 (*)    All other components within normal limits  SARS CORONAVIRUS 2 (TAT 6-24 HRS)  TSH  BASIC METABOLIC PANEL  CBC    EKG None  Radiology DG Chest 2 View  Result Date: 07/12/2019 CLINICAL DATA:  Shortness of breath. Additional history provided: Patient reports worsening shortness of breath for 3 weeks. EXAM: CHEST - 2 VIEW COMPARISON:  Chest radiograph 06/19/2019 FINDINGS: Redemonstrated left chest single lead AICD device. Prior median sternotomy. Unchanged mild cardiomegaly and central pulmonary vascular congestion. Aortic atherosclerosis. Subtle  interstitial prominence within the mid to lower lung fields bilaterally is similar to prior examination  06/19/2019. No evidence of pleural effusion or pneumothorax. No acute bony abnormality. IMPRESSION: No significant interval change as compared to chest radiograph 06/19/2019. Mild cardiomegaly with central pulmonary vascular congestion. Subtle interstitial prominence within the mid to lower lung fields likely reflecting interstitial edema. Aortic atherosclerosis. Electronically Signed   By: Kellie Simmering DO   On: 07/12/2019 15:31    Procedures Procedures (including critical care time)  Medications Ordered in ED Medications  sodium chloride flush (NS) 0.9 % injection 3 mL (3 mLs Intravenous Not Given 07/12/19 1311)  apixaban (ELIQUIS) tablet 5 mg (has no administration in time range)  amiodarone (NEXTERONE PREMIX) 360-4.14 MG/200ML-% (1.8 mg/mL) IV infusion (60 mg/hr Intravenous New Bag/Given 07/12/19 1632)  amiodarone (NEXTERONE PREMIX) 360-4.14 MG/200ML-% (1.8 mg/mL) IV infusion (has no administration in time range)  aspirin tablet 81 mg (has no administration in time range)  atorvastatin (LIPITOR) tablet 40 mg (has no administration in time range)  digoxin (LANOXIN) tablet 125 mcg (has no administration in time range)  Insulin Glargine SOPN 70 Units (has no administration in time range)  metFORMIN (GLUCOPHAGE) tablet 1,000 mg (has no administration in time range)  pantoprazole (PROTONIX) EC tablet 40 mg (has no administration in time range)  albuterol (PROVENTIL) (2.5 MG/3ML) 0.083% nebulizer solution 2.5 mg (has no administration in time range)  nitroGLYCERIN (NITROSTAT) SL tablet 0.4 mg (has no administration in time range)  acetaminophen (TYLENOL) tablet 650 mg (has no administration in time range)  ondansetron (ZOFRAN) injection 4 mg (has no administration in time range)  furosemide (LASIX) injection 40 mg (has no administration in time range)  furosemide (LASIX) injection 40 mg (40 mg  Intravenous Given 07/12/19 1341)    ED Course  I have reviewed the triage vital signs and the nursing notes.  Pertinent labs & imaging results that were available during my care of the patient were reviewed by me and considered in my medical decision making (see chart for details).  74 yo male here with shortness of breath progressively worsening for past month.  He is on baseline 4L [place].  Concern from cardiology team for acute decompensation of heart failure, which is advanced.  I gave him IV diuresis here.    Initially, I had some concern for ACS on his presenting ecg, but his history and labs were more consistent with CHF exacerbation and demand ischemia.  CODE STEMI was cancelled.  Clinical Course as of Jul 12 1914  Fri Jul 12, 2019  1329 Initially activated code stemi based on possible elevations in inferior leads.  However after assessing patient's history and speaking to Dr [place] of cardiology, this is felt to be less likely ACS related, and likely BBB pattern.  Code stemi cancelled. Patient has no chest pain    [MT]  1420 Trop elevated in setting of AKI, with no chest pain I think this is likely related to demand ischemia. This is very likely heart failure, will page cardiology.  In this clinical setting I have a lower suspicion for acute PE but will discuss with cardiology   [MT]  1447 Cards consulted, to see patient   [MT]  1623 Pending admission, anticipate cardiolgoy admission   [MT]    Clinical Course User Index [MT] Jannetta Massey, Carola Rhine, MD    Final Clinical Impression(s) / ED Diagnoses Final diagnoses:  Acute on chronic congestive heart failure, unspecified heart failure type [facility])    Rx / DC Orders ED Discharge Orders    None  Wyvonnia Dusky, MD 07/12/19 [phone]

## 2019-07-12 NOTE — Telephone Encounter (Signed)
Discussed with Dr. Gerarda Fraction - patient in decompensated CHF with AKI and hyperkalemia. Agree with recommendation to send to the hospital. Notified Trish (cardmaster) that he will be on the way.  Dr Debara Pickett

## 2019-07-12 NOTE — H&P (Addendum)
Cardiology Admission History and Physical:   Patient ID: [name] MRN: [id number]; DOB: 12-19-1945   Admission date: 07/12/2019  Primary Care Provider: Redmond School, MD Primary Cardiologist: Sanda Klein, MD  Chief Complaint:  SOB  Patient Profile:   Jordan Ross is a 74 y.o. male with CAD s/p CABG in 2001 (LIMA-LAD, SVG to ramus, circumflex, and PDA), HTN, ICM, Chronic systolic CHF, S/p st. Jude single chamber ICD, AAA s/p EVAR by Dr. Trula Slade, DM and HLD presents for SOB evaluation.   He had an anterior MI 2001 treated with coronary bypass grafting by Dr. Ladona Mow, SVG to ramus, circumflex, and PDA. His EF at that time was 35%. He underwent ICD implantation for primary prevention 10/10.   Last seen by Dr. Sallyanne Kuster 03/2018.  Most recently patient seen in ER 06/19/19 for SOB which improved after 3 doses of lasix. BNP 1000. Discharge from ER.   Seen Virtually by APP 06/21/19 with improved symptoms on lasix 20mg  qd with 9lb weight loss since ER visit. He was again seen in clinic 07/04/19 by APP. Breathing stable.   History of Present Illness:   Mr. [name] presented for evaluation of worsening shortness of breath.  Patient reports taking Lasix 20 mg daily.  His shortness of breath comes in an episodes.  Worsening the past few days.  Sometimes seems like he goes into respiratory distress and cannot take any breath.  Bending makes better.  No associated chest pain, palpitation, dizziness or syncope.  Due to worsening systems he was seen by PCP.  Labs showed AKI and hyperkalemia and advised to come to ER.  He was noted tachycardic in 140s.  He denies palpitation.  BNP almost 2000 (up from last ER visit). Hs-troponin 40>>150. D-dimer 1.99. pending COVD.   Device interrogation showed tachycardia for the past 8 days with heart failure.  Some P waves.  Rhythm concerning for atrial flutter.  Past Medical History:  Diagnosis Date  . Abdominal aortic aneurysm ([place])   . AICD  (automatic cardioverter/defibrillator) present    St Jude  . Anemia    iron deficiency anemia,takes iron pill daily  . Arthritis   . Coronary artery disease    AWMI 2001  . Diverticulosis   . GERD (gastroesophageal reflux disease)    takes Protonix daily  . History of colon polyps    benign  . History of migraine 40 yrs ago  . History of shingles   . Hyperlipidemia    takes Atorvastatin and Niacin daily  . Hypertension    takes Diovan,Spironolactone, and Carvedilol daily  . ICD (implantable cardioverter-defibrillator), single, in situ   . Ischemic cardiomyopathy   . Joint swelling   . LV dysfunction    takes Digoxin daily  . Myocardial infarction ([place]) 2001  . Pneumonia 40 yrs ago   hx of  . Shortness of breath dyspnea    with exertion.States he was a smoker for yr  . Status post coronary artery bypass grafting   . Type 2 diabetes mellitus (HCC)    takes Glipizide,Metformin,Invokana,and Lantus daily.Average fasting blood sugar runs about 140    Past Surgical History:  Procedure Laterality Date  . ABDOMINAL AORTIC ENDOVASCULAR STENT GRAFT N/A 05/22/2015   Procedure: ABDOMINAL AORTIC ENDOVASCULAR STENT GRAFT;  Surgeon: Serafina Mitchell, MD;  Location: [facility];  Service: Vascular;  Laterality: N/A;  . CARDIAC CATHETERIZATION  2001  . COLONOSCOPY N/A 03/21/2014   Procedure: COLONOSCOPY;  Surgeon: Rogene Houston, MD;  [facility];  Service: Endoscopy;  Laterality: N/A;  855 - moved to 12/4 @ 8:30 - Ann notified pt  . CORONARY ARTERY BYPASS GRAFT  2001   unsure if it was 3 or 4  . DOPPLER ECHOCARDIOGRAPHY     2011 38% EF.2013 EF 30-35%     Medications Prior to Admission: Prior to Admission medications   Medication Sig Start Date End Date Taking? Authorizing Provider  albuterol (VENTOLIN HFA) 108 (90 Base) MCG/ACT inhaler Inhale 1-2 puffs into the lungs every 4 (four) hours as needed for wheezing or shortness of breath.  06/07/19   [provider]  aspirin 81  MG tablet Take 81 mg by mouth daily.     [provider]  atorvastatin (LIPITOR) 40 MG tablet Take 40 mg by mouth daily. 03/25/13   [provider]  carvedilol (COREG) 12.5 MG tablet Take 12.5 mg by mouth 2 (two) times daily. 03/25/13   [provider]  digoxin (LANOXIN) 0.125 MG tablet Take 125 mcg by mouth daily. 03/25/13   [provider]  ENTRESTO 49-51 MG Take 1 tablet by mouth 2 (two) times daily. 07/11/19   [provider]  FORA LANCETS MISC Test bid as directed. E11.65. Patient taking differently: 1 each by Other route in the morning and at bedtime.  04/08/16   Cassandria Anger, MD  furosemide (LASIX) 20 MG tablet Take 20 mg by mouth daily. 06/17/19   [provider]  glucose blood (FORA V10 BLOOD GLUCOSE TEST) test strip Use as instructed bid. E11.65 Patient taking differently: 1 each by Other route in the morning and at bedtime.  05/25/17   Cassandria Anger, MD  glucose blood test strip Use as instructed Patient taking differently: 1 each by Other route See admin instructions. Use as instructed 06/25/15   Cassandria Anger, MD  Insulin Glargine (TOUJEO SOLOSTAR) 300 UNIT/ML SOPN Inject 70 Units into the skin at bedtime. 05/25/17   Cassandria Anger, MD  IRON PO Take 65 mg by mouth daily.    [provider]  LANCETS ULTRA THIN MISC Test BG every morning Patient taking differently: 1 each by Other route in the morning.  02/26/16   Cassandria Anger, MD  LITETOUCH PEN NEEDLES 31G X 8 MM MISC USE AT BEDTIME AS DIRECTED. Patient taking differently: 1 each by Other route at bedtime.  12/14/15   Cassandria Anger, MD  metFORMIN (GLUCOPHAGE) 500 MG tablet Take 1,000 mg by mouth 2 (two) times daily with a meal.  03/25/13   [provider]  metolazone (ZAROXOLYN) 2.5 MG tablet Take 2.5 mg by mouth daily. 07/11/19   [provider]  pantoprazole (PROTONIX) 40 MG tablet Take 40 mg by mouth daily. 03/25/13    [provider]  spironolactone (ALDACTONE) 25 MG tablet Take 25 mg by mouth daily. 03/25/13   [provider]  valsartan (DIOVAN) 160 MG tablet Take 160 mg by mouth daily. 03/25/13   [provider]     Allergies:   No Known Allergies  Social History:   Social History   Socioeconomic History  . Marital status: Widowed    Spouse name: Not on file  . Number of children: Not on file  . Years of education: Not on file  . Highest education level: Not on file  Occupational History  . Not on file  Tobacco Use  . Smoking status: Former Smoker    Years: 40.00  . Smokeless tobacco: Never Used  .  Tobacco comment: quit smoking in 2001  Substance and Sexual Activity  . Alcohol use: No  . Drug use: No  . Sexual activity: Yes  Other Topics Concern  . Not on file  Social History Narrative  . Not on file   Social Determinants of Health   Financial Resource Strain:   . Difficulty of Paying Living Expenses:   Food Insecurity:   . Worried About Charity fundraiser in the Last Year:   . Arboriculturist in the Last Year:   Transportation Needs:   . Film/video editor (Medical):   [name] Lack of Transportation (Non-Medical):   Physical Activity:   . Days of Exercise per Week:   . Minutes of Exercise per Session:   Stress:   . Feeling of Stress :   Social Connections:   . Frequency of Communication with Friends and Family:   . Frequency of Social Gatherings with Friends and Family:   . Attends Religious Services:   . Active Member of Clubs or Organizations:   . Attends Archivist Meetings:   [name] Marital Status:   Intimate Partner Violence:   . Fear of Current or Ex-Partner:   . Emotionally Abused:   [name] Physically Abused:   . Sexually Abused:     Family History:   The patient's family history includes COPD in his mother; Heart disease in his brother.    ROS:  Please see the history of present illness.  All other ROS reviewed and negative.      Physical Exam/Data:   Vitals:   07/12/19 1246 07/12/19 1250 07/12/19 1309 07/12/19 1500  BP:  101/80 91/63 106/69  Pulse:  (!) 135 (!) 140 (!) 141  Resp:  18 (!) 21 (!) 22  Temp:  97.6 F (36.4 C)    TempSrc:  Oral    SpO2:  (!) 88% 100% 100%  Weight: 76.2 kg     Height: 6' (1.829 m)      No intake or output data in the 24 hours ending 07/12/19 1552 Last 3 Weights 07/12/2019 07/04/2019 06/21/2019  Weight (lbs) 168 lb 168 lb 171 lb  Weight (kg) 76.204 kg 76.204 kg 77.565 kg     Body mass index is 22.78 kg/m.  General:  Well nourished, well developed, in no acute distress HEENT: normal Lymph: no adenopathy Neck: + JVD Endocrine:  No thryomegaly Vascular: No carotid bruits; FA pulses 2+ bilaterally without bruits  Cardiac:  normal S1, S2; regular tachycardic; no murmur  Lungs:  clear to auscultation bilaterally, no wheezing, rhonchi or rales  Abd: soft, nontender, no hepatomegaly  Ext: no edema Musculoskeletal:  No deformities, BUE and BLE strength normal and equal Skin: warm and dry  Neuro:  CNs 2-12 intact, no focal abnormalities noted Psych:  Normal affect    EKG:  The ECG that was done today was personally reviewed and demonstrates atrial flutter (read as sinus tachycardia) at rate of 139 bpm  Relevant CV Studies:  Echo 03/2012 Study Conclusions   - Left ventricle: The cavity size was moderately dilated.  Systolic function was moderately to severely reduced. The  estimated ejection fraction was in the range of 30% to  35%. Severe hypokinesis of the anteroseptal myocardium.  Moderate hypokinesis of the apical myocardium. Doppler  parameters are consistent with abnormal left ventricular  relaxation (grade 1 diastolic dysfunction).  - Aortic valve: Valve area: 1.05cm^2(VTI). Valve area:  0.99cm^2 (Vmax).  - Mitral valve: Calcified annulus. Mild regurgitation.  Valve  area by pressure half-time: 1.48cm^2. Valve area by  continuity equation (using  LVOT flow): 1.32cm^2.  - Left atrium: The atrium was mildly dilated.  - Atrial septum: No defect or patent foramen ovale was  identified.  Impressions:   - EF aapproximately 35% with antero-septal & apical wall  motion abnormalities.  icd WIRE IN rv.    Laboratory Data:  High Sensitivity Troponin:   Recent Labs  Lab 06/19/19 1511 06/19/19 1703 07/12/19 1305  TROPONINIHS 40* 41* 150*      Chemistry Recent Labs  Lab 07/12/19 1305  NA 134*  K 4.7  CL 103  CO2 15*  GLUCOSE 183*  BUN 48*  CREATININE 1.97*  CALCIUM 9.4  GFRNONAA 33*  GFRAA 38*  ANIONGAP 16*    No results for input(s): PROT, ALBUMIN, AST, ALT, ALKPHOS, BILITOT in the last 168 hours. Hematology Recent Labs  Lab 07/12/19 1305  WBC 8.2  RBC 4.24  HGB 12.9*  HCT 39.8  MCV 93.9  MCH 30.4  MCHC 32.4  RDW 14.2  PLT 210   BNP Recent Labs  Lab 07/12/19 1323  BNP 1,976.9*    DDimer  Recent Labs  Lab 07/12/19 1323  DDIMER 1.99*     Radiology/Studies:  DG Chest 2 View  Result Date: 07/12/2019 CLINICAL DATA:  Shortness of breath. Additional history provided: Patient reports worsening shortness of breath for 3 weeks. EXAM: CHEST - 2 VIEW COMPARISON:  Chest radiograph 06/19/2019 FINDINGS: Redemonstrated left chest single lead AICD device. Prior median sternotomy. Unchanged mild cardiomegaly and central pulmonary vascular congestion. Aortic atherosclerosis. Subtle interstitial prominence within the mid to lower lung fields bilaterally is similar to prior examination 06/19/2019. No evidence of pleural effusion or pneumothorax. No acute bony abnormality. IMPRESSION: No significant interval change as compared to chest radiograph 06/19/2019. Mild cardiomegaly with central pulmonary vascular congestion. Subtle interstitial prominence within the mid to lower lung fields likely reflecting interstitial edema. Aortic atherosclerosis. Electronically Signed   By: Kellie Simmering DO   On: 07/12/2019 15:31     Assessment and Plan:   1. New onset atrial flutter with rapid ventricular rate -Device interrogated and showed atrial flutter for the past 8 days at rate of 140 bpm.  He denies palpitation.  His only complaints is shortness of breath with intermittent worsening.  Mali vas score of 4 for age, diabetes, CHF and CAD.  Start Eliquis 5 mg twice daily for anticoagulation.  Blood pressure soft.  Start IV amiodarone.  Tentatively placed on TEE guided cardioversion on Monday.  Will need consent and order over the weekend.  2.  Acute on chronic systolic heart failure -Evidence of volume overload by device check. -Start IV Lasix 40 mg twice daily. -Daily weight and strict INO -Watch renal function very closely -Hold Coreg, spironolactone and Entresto due to soft blood pressure -Valsartan and metolazone listed on home medication (needs review) -Continue digoxin  3.  AKI -Serum creatinine was normal 07/11/19 -Follow with diuresis  4.  Diabetes mellitus -Continue home medication   5. CAD s/p CABG - No angina - Continue ASA and statin  6. Elevated D-dimer - Started anticoagualtion  Consider closely reviewing home medication prior to discharge  Severity of Illness: The appropriate patient status for this patient is INPATIENT. Inpatient status is judged to be reasonable and necessary in order to provide the required intensity of service to ensure the patient's safety. The patient's presenting symptoms, physical exam findings, and initial radiographic and laboratory data in the  context of their chronic comorbidities is felt to place them at high risk for further clinical deterioration. Furthermore, it is not anticipated that the patient will be medically stable for discharge from the hospital within 2 midnights of admission. The following factors support the patient status of inpatient.   " The patient's presenting symptoms include SOB. " The worrisome physical exam findings include tachycardia  and heart failure " The initial radiographic and laboratory data are worrisome because of atrial flutter " The chronic co-morbidities include CAD. S/p ICD   * I certify that at the point of admission it is my clinical judgment that the patient will require inpatient hospital care spanning beyond 2 midnights from the point of admission due to high intensity of service, high risk for further deterioration and high frequency of surveillance required.*    For questions or updates, please contact [place] Please consult [email or web address] for contact info under        SignedLeanor Kail, PA  07/12/2019 3:52 PM    I have seen, examined the patient, and reviewed the above assessment and plan.  Changes to above are made where necessary.  On exam, volume overloaded, tachycardic irregular rhythm.  The patient presents with a wide complex tachycardia which I believe to be atrial flutter with aberrancy. Will admit to cardiology for CHF management. Initiate eliquis with plans for TEE guided cardioversion on Monday.  Ultimately, he may do well with atrial flutter ablation.  I am not certain that Dr Macky Lower schedule will allow for Monday.  We will update echo. Rate control with amiodarone rather than diltiazem given suppressed EF with CHF.  Cardiology to see over the weekend.  Co Sign: Thompson Grayer, MD

## 2019-07-12 NOTE — Significant Event (Signed)
Rapid Response Event Note  Overview: Hypotension  Initial Focused Assessment: Called to bedside for low BPs. Per nurse, manual BPs - SBP 60s, HR 130s - AFlutter - narrow complex. 99% on 2L [place], RR 18-20 - even normal effort. Hands/feet are cool touch but per patient - this is his baseline. Patient is neuro intact, no deficits, denies pain, no SOB, no dizziness. Patient has no complaints - feel good overall. Making urine per nurse as well. Lung sounds clear in all fields. + palpable pulses.   I check a manual pressure with an Adult Regular cuff it was 78/58. I realized that perhaps the cuff might be bigger for the patient. I placed on small adult cuff on him and SBP were in the 85/63 (71) and 88/71 (78), HR 132. CARDS MD came to bedside, also examined the patient as well.   Interventions: -- NO RRT Interventions  Plan of Care: -- Monitor VS  -- Strict I/Os -- Monitor for signs of ongoing hypoperfusion - altered mental status, skin color changes, decreased UOP, or signs of distress -- Rest per MD  Event Summary:  Call Time 1804 Arrival Time 1809 End Time [place], [place]

## 2019-07-12 NOTE — ED Triage Notes (Signed)
Patient c/o worsening shortness of breath x 3 weeks; patient stated he might have more fluids in lungs;

## 2019-07-12 NOTE — Progress Notes (Signed)
Patient came up from ED and we had a hard time getting the patients blood pressure. I checked manually and couldn't hear it very well. I dopplered patients blood pressure and in left arm it was 68 systolically and in right arm it was 64. I could see what looked like P waves at times but heart rateint he 140s. I called and spoke with Melina Copa, PA and MD to come see patient. Patient is alert and oriented. He denies pain, lungs clear and sats in the upper 90s on 2l [place]. He is asymptomatic with this reading of a blood pressure. His extremities are cool but patient stated he stays cold with his arms and legs and that is nothing new. [place], notified to be made aware. Awaiting MD for further plans

## 2019-07-12 NOTE — ED Notes (Signed)
Cassie ([facility]) called @ 1306-per Dr. Langston Masker called by Levada Dy.

## 2019-07-12 NOTE — Progress Notes (Signed)
Dr. Margaretann Loveless came to room to see patient. Plan to closely monitor and continue strict I/O, monitor for signs of hypotension and then reassess. She saw EKG. We were able to get a automatic blood pressure in thh 70-80s with a small cuff. MD aware.

## 2019-07-13 ENCOUNTER — Inpatient Hospital Stay (HOSPITAL_COMMUNITY): Payer: Medicare Other

## 2019-07-13 DIAGNOSIS — I340 Nonrheumatic mitral (valve) insufficiency: Secondary | ICD-10-CM

## 2019-07-13 DIAGNOSIS — I350 Nonrheumatic aortic (valve) stenosis: Secondary | ICD-10-CM

## 2019-07-13 DIAGNOSIS — R0600 Dyspnea, unspecified: Secondary | ICD-10-CM

## 2019-07-13 LAB — CBC
HCT: 38.6 % — ABNORMAL LOW (ref 39.0–52.0)
Hemoglobin: 12.9 g/dL — ABNORMAL LOW (ref 13.0–17.0)
MCH: 30.6 pg (ref 26.0–34.0)
MCHC: 33.4 g/dL (ref 30.0–36.0)
MCV: 91.7 fL (ref 80.0–100.0)
Platelets: 216 10*3/uL (ref 150–400)
RBC: 4.21 MIL/uL — ABNORMAL LOW (ref 4.22–5.81)
RDW: 14.2 % (ref 11.5–15.5)
WBC: 9.1 10*3/uL (ref 4.0–10.5)
nRBC: 0 % (ref 0.0–0.2)

## 2019-07-13 LAB — BASIC METABOLIC PANEL
Anion gap: 13 (ref 5–15)
BUN: 48 mg/dL — ABNORMAL HIGH (ref 8–23)
CO2: 18 mmol/L — ABNORMAL LOW (ref 22–32)
Calcium: 9.4 mg/dL (ref 8.9–10.3)
Chloride: 103 mmol/L (ref 98–111)
Creatinine, Ser: 1.88 mg/dL — ABNORMAL HIGH (ref 0.61–1.24)
GFR calc Af Amer: 41 mL/min — ABNORMAL LOW (ref >60)
GFR calc non Af Amer: 35 mL/min — ABNORMAL LOW (ref >60)
Glucose, Bld: 157 mg/dL — ABNORMAL HIGH (ref 70–99)
Potassium: 4.6 mmol/L (ref 3.5–5.1)
Sodium: 134 mmol/L — ABNORMAL LOW (ref 135–145)

## 2019-07-13 LAB — TSH: TSH: 1.873 u[IU]/mL (ref 0.350–4.500)

## 2019-07-13 LAB — GLUCOSE, CAPILLARY
Glucose-Capillary: 155 mg/dL — ABNORMAL HIGH (ref 70–99)
Glucose-Capillary: 153 mg/dL — ABNORMAL HIGH (ref 70–99)
Glucose-Capillary: 131 mg/dL — ABNORMAL HIGH (ref 70–99)

## 2019-07-13 LAB — ECHOCARDIOGRAM COMPLETE
Height: 72 in
Weight: 2638.47 oz

## 2019-07-13 LAB — MRSA PCR SCREENING: MRSA by PCR: NEGATIVE

## 2019-07-13 MED ORDER — SODIUM CHLORIDE 0.9 % IV BOLUS
500.0000 mL | Freq: Once | INTRAVENOUS | Status: AC
  Administered 2019-07-13: 500 mL via INTRAVENOUS

## 2019-07-13 MED ORDER — INSULIN ASPART 100 UNIT/ML ~~LOC~~ SOLN
0.0000 [IU] | Freq: Three times a day (TID) | SUBCUTANEOUS | Status: DC
  Administered 2019-07-13 (×2): 2 [IU] via SUBCUTANEOUS
  Administered 2019-07-13: 1 [IU] via SUBCUTANEOUS
  Administered 2019-07-14: 2 [IU] via SUBCUTANEOUS
  Administered 2019-07-15 – 2019-07-16 (×2): 1 [IU] via SUBCUTANEOUS
  Administered 2019-07-18: 3 [IU] via SUBCUTANEOUS
  Administered 2019-07-18 – 2019-07-19 (×3): 1 [IU] via SUBCUTANEOUS
  Administered 2019-07-20: 3 [IU] via SUBCUTANEOUS
  Administered 2019-07-21 – 2019-07-22 (×2): 5 [IU] via SUBCUTANEOUS
  Administered 2019-07-23: 1 [IU] via SUBCUTANEOUS
  Administered 2019-07-23 – 2019-07-24 (×2): 2 [IU] via SUBCUTANEOUS
  Administered 2019-07-25 – 2019-07-28 (×2): 3 [IU] via SUBCUTANEOUS

## 2019-07-13 MED ORDER — CHLORHEXIDINE GLUCONATE CLOTH 2 % EX PADS
6.0000 | MEDICATED_PAD | Freq: Every day | CUTANEOUS | Status: DC
  Administered 2019-07-13 – 2019-07-26 (×14): 6 via TOPICAL

## 2019-07-13 MED ORDER — PERFLUTREN LIPID MICROSPHERE
1.0000 mL | INTRAVENOUS | Status: AC | PRN
  Administered 2019-07-13: 2 mL via INTRAVENOUS
  Filled 2019-07-13: qty 10

## 2019-07-13 NOTE — Procedures (Signed)
Heart rate is too high for accurate echo at this time. 

## 2019-07-13 NOTE — Progress Notes (Signed)
Progress Note  Patient Name: Jordan Ross Date of Encounter: 07/13/2019  Primary Cardiologist: Sanda Klein, MD   Subjective   No complaints  Inpatient Medications    Scheduled Meds: . apixaban  5 mg Oral BID  . aspirin  81 mg Oral Daily  . atorvastatin  40 mg Oral Daily  . furosemide  40 mg Intravenous Daily  . insulin glargine  30 Units Subcutaneous QHS  . metFORMIN  1,000 mg Oral BID WC  . pantoprazole  40 mg Oral Daily  . sodium chloride flush  3 mL Intravenous Once   Continuous Infusions: . amiodarone 30 mg/hr (07/12/19 2200)   PRN Meds: acetaminophen, albuterol, nitroGLYCERIN, ondansetron (ZOFRAN) IV   Vital Signs    Vitals:   07/12/19 2049 07/12/19 2322 07/13/19 0057 07/13/19 0450  BP: 95/82 106/86  (!) 83/58  Pulse: (!) 117 99 (!) 115 (!) 116  Resp: 16 18 18 18   Temp: 98 F (36.7 C)  98.3 F (36.8 C) 97.6 F (36.4 C)  TempSrc: Oral  Oral Oral  SpO2: (!) 79% 98% 100% 100%  Weight:    74.8 kg  Height:        Intake/Output Summary (Last 24 hours) at 07/13/2019 0711 Last data filed at 07/13/2019 0454 Gross per 24 hour  Intake 240 ml  Output 650 ml  Net -410 ml   Last 3 Weights 07/13/2019 07/12/2019 07/04/2019  Weight (lbs) 164 lb 14.5 oz 168 lb 168 lb  Weight (kg) 74.8 kg 76.204 kg 76.204 kg      Telemetry    Aflutter elevated rates- Personally Reviewed  ECG    n/a - Personally Reviewed  Physical Exam   GEN: No acute distress.   Neck: elevated jvd Cardiac: tachy  Respiratory: Clear to auscultation bilaterally. GI: Soft, nontender, non-distended  MS: No edema; No deformity. Neuro:  Nonfocal  Psych: Normal affect   Labs    High Sensitivity Troponin:   Recent Labs  Lab 06/19/19 1511 06/19/19 1703 07/12/19 1305 07/12/19 1523  TROPONINIHS 40* 41* 150* 156*      Chemistry Recent Labs  Lab 07/12/19 1305 07/13/19 0430  NA 134* 134*  K 4.7 4.6  CL 103 103  CO2 15* 18*  GLUCOSE 183* 157*  BUN 48* 48*  CREATININE 1.97*  1.88*  CALCIUM 9.4 9.4  GFRNONAA 33* 35*  GFRAA 38* 41*  ANIONGAP 16* 13     Hematology Recent Labs  Lab 07/12/19 1305 07/13/19 0430  WBC 8.2 9.1  RBC 4.24 4.21*  HGB 12.9* 12.9*  HCT 39.8 38.6*  MCV 93.9 91.7  MCH 30.4 30.6  MCHC 32.4 33.4  RDW 14.2 14.2  PLT 210 216    BNP Recent Labs  Lab 07/12/19 1323  BNP 1,976.9*     DDimer  Recent Labs  Lab 07/12/19 1323  DDIMER 1.99*     Radiology    DG Chest 2 View  Result Date: 07/12/2019 CLINICAL DATA:  Shortness of breath. Additional history provided: Patient reports worsening shortness of breath for 3 weeks. EXAM: CHEST - 2 VIEW COMPARISON:  Chest radiograph 06/19/2019 FINDINGS: Redemonstrated left chest single lead AICD device. Prior median sternotomy. Unchanged mild cardiomegaly and central pulmonary vascular congestion. Aortic atherosclerosis. Subtle interstitial prominence within the mid to lower lung fields bilaterally is similar to prior examination 06/19/2019. No evidence of pleural effusion or pneumothorax. No acute bony abnormality. IMPRESSION: No significant interval change as compared to chest radiograph 06/19/2019. Mild cardiomegaly with central pulmonary vascular  congestion. Subtle interstitial prominence within the mid to lower lung fields likely reflecting interstitial edema. Aortic atherosclerosis. Electronically Signed   By: Kellie Simmering DO   On: 07/12/2019 15:31    Cardiac Studies     Patient Profile     Jordan Ross is a 74 y.o. male with CAD s/p CABG in 2001 (LIMA-LAD, SVG to ramus, circumflex, and PDA), HTN, ICM, Chronic systolic CHF, S/p st. Jude single chamber ICD, AAA s/p EVAR by Dr. Trula Slade, DM and HLD presents for SOB evaluation.   Assessment & Plan    1. New onset aflutter with RVR - Device interrogated and showed atrial flutter for the past 8 days at rate of 140 bpm.  He denies palpitations, presented with SOB - started on eliquis 5mg  bid. - due to soft bp's, started on IV  amiodarone, plans for TEE/DCCV on Monday   2. Acute on chronic systolic HF - echo pending - has AICD - soft bp's, home entresto held. NO beta blocker on home list.  - - I/Os limited data, negative 421mL per charting.  - with soft bp's no further diuresis today.    3. History of CAD with prior CABG -no acute issues  4. AKI - downtrend in Cr with diuresis.   5. Hypotension - home bp agents on hold - afebrile, no WBC. Stable Hgb. Asymptomatic.  - hold amio this AM and follow bp's    For questions or updates, please contact [place] Please consult [email or web address] for contact info under        Signed, Carlyle Dolly, MD  07/13/2019, 7:11 AM

## 2019-07-13 NOTE — Progress Notes (Signed)
Notified Branch, MD about pt's BP 74/44, MAP 54.

## 2019-07-13 NOTE — Progress Notes (Signed)
  Echocardiogram 2D Echocardiogram with definity has been performed.  Darlina Sicilian M 07/13/2019, 1:41 PM

## 2019-07-13 NOTE — Progress Notes (Signed)
Peristsent hypotension this afternoon after stopping his amio and holding diuretic. Aflutter with rates 90s to 120s, would not be severe enough to cause this peristent hypotension though certainly worsening his HF in general. We had an echo done despite the afluter with high rates to eval RV and LV function. Shows LVEF decreased <20%, global hypokinesis with anteroseptal and apical akinesis, RV poorly visualized but appears to have decreased systolic dysfunction.   I think persistent hypotension is secondary to cardiogenic shock. Will transfer to ICU, consult CCM to help manage and help with central line placement. Would start levophed, once MAPs improved restart his amio drip and IV diuresis. . Aflutter rates in 90s, does not warrant emergent cardioversion at this time. His looks better than his current numbers support and it is deceptive, concerned he may change symptomaticlaly very quickly based on the data that we see.    Carlyle Dolly MD

## 2019-07-13 NOTE — Progress Notes (Signed)
Pt HR remains elevated in the 130's, BP mostly soft 80-90's/ 50-60's. Cardiology fellow notified. No new orders given at this time. Will continue to monitor closely.

## 2019-07-13 NOTE — Progress Notes (Signed)
NAME:  Jordan Ross, MRN:  [id number], DOB:  17-Aug-1945, LOS: 1 ADMISSION DATE:  07/12/2019, CONSULTATION DATE: 07/13/2019 REFERRING MD: [facility], CHIEF COMPLAINT: Hypotension  HPI/course in hospital  74 year old man with history of ischemic cardiomyopathy and prior CABG. Presented yesterday with increasing shortness of breath and was found to be in congestive heart failure, atrial fibrillation with rapid ventricular response.  EF now less than 20%.  Diuresed, started on IV amiodarone for rate control.  Plan for DC cardioversion back to sinus rhythm.  Hypotensive with systolic in the 123XX123 on the floor and transfer to ICU.  Past Medical History   Past Medical History:  Diagnosis Date  . Abdominal aortic aneurysm ([employer])   . AICD (automatic cardioverter/defibrillator) present    St Jude  . Anemia    iron deficiency anemia,takes iron pill daily  . Arthritis   . Coronary artery disease    AWMI 2001  . Diverticulosis   . GERD (gastroesophageal reflux disease)    takes Protonix daily  . History of colon polyps    benign  . History of migraine 40 yrs ago  . History of shingles   . Hyperlipidemia    takes Atorvastatin and Niacin daily  . Hypertension    takes Diovan,Spironolactone, and Carvedilol daily  . ICD (implantable cardioverter-defibrillator), single, in situ   . Ischemic cardiomyopathy   . Joint swelling   . LV dysfunction    takes Digoxin daily  . Myocardial infarction ([place]) 2001  . Pneumonia 40 yrs ago   hx of  . Shortness of breath dyspnea    with exertion.States he was a smoker for yr  . Status post coronary artery bypass grafting   . Type 2 diabetes mellitus (HCC)    takes Glipizide,Metformin,Invokana,and Lantus daily.Average fasting blood sugar runs about 140     Past Surgical History:  Procedure Laterality Date  . ABDOMINAL AORTIC ENDOVASCULAR STENT GRAFT N/A 05/22/2015   Procedure: ABDOMINAL AORTIC ENDOVASCULAR STENT GRAFT;  Surgeon: Serafina Mitchell, MD;  Location: [facility];  Service: Vascular;  Laterality: N/A;  . CARDIAC CATHETERIZATION  2001  . COLONOSCOPY N/A 03/21/2014   Procedure: COLONOSCOPY;  Surgeon: Rogene Houston, MD;  [facility];  Service: Endoscopy;  Laterality: N/A;  855 - moved to 12/4 @ 8:30 - Ann notified pt  . CORONARY ARTERY BYPASS GRAFT  2001   unsure if it was 3 or 4  . DOPPLER ECHOCARDIOGRAPHY     2011 38% EF.2013 EF 30-35%     Review of Systems:   Review of Systems  Gastrointestinal: Positive for constipation.  All other systems reviewed and are negative.   Social History   reports that he has quit smoking. He quit after 40.00 years of use. He has never used smokeless tobacco. He reports that he does not drink alcohol or use drugs.   Family History   His family history includes COPD in his mother; Heart disease in his brother.   Allergies No Known Allergies   Home Medications  Prior to Admission medications   Medication Sig Start Date End Date Taking? Authorizing Provider  acetaminophen (TYLENOL) 325 MG tablet Take 975 mg by mouth daily as needed (back pain).   Yes [provider]  albuterol (VENTOLIN HFA) 108 (90 Base) MCG/ACT inhaler Inhale 1-2 puffs into the lungs every 4 (four) hours as needed for wheezing or shortness of breath.  06/07/19  Yes [provider]  aspirin EC 81 MG tablet  Take 81 mg by mouth at bedtime.   Yes [provider]  atorvastatin (LIPITOR) 80 MG tablet Take 80 mg by mouth daily.   Yes [provider]  ferrous sulfate 325 (65 FE) MG tablet Take 325 mg by mouth daily with breakfast.   Yes [provider]  furosemide (LASIX) 20 MG tablet Take 20 mg by mouth daily. 06/17/19  Yes [provider]  glipiZIDE (GLUCOTROL) 10 MG tablet Take 10 mg by mouth 2 (two) times daily before a meal.    Yes [provider]  insulin glargine (LANTUS SOLOSTAR) 100 UNIT/ML Solostar Pen Inject 30 Units into the skin at  bedtime.   Yes [provider]  meloxicam (MOBIC) 15 MG tablet Take 15 mg by mouth daily.   Yes [provider]  metFORMIN (GLUCOPHAGE) 500 MG tablet Take 1,000 mg by mouth 2 (two) times daily with a meal.  03/25/13  Yes [provider]  metolazone (ZAROXOLYN) 2.5 MG tablet Take 2.5 mg by mouth daily. 07/11/19  Yes [provider]  Multiple Vitamin (MULTIVITAMIN WITH MINERALS) TABS tablet Take 1 tablet by mouth daily.   Yes [provider]  pantoprazole (PROTONIX) 40 MG tablet Take 40 mg by mouth daily. 03/25/13  Yes [provider]  sacubitril-valsartan (ENTRESTO) 49-51 MG Take 1 tablet by mouth 2 (two) times daily.   Yes [provider]  FORA LANCETS MISC Test bid as directed. E11.65. Patient taking differently: 1 each by Other route in the morning and at bedtime.  04/08/16   Cassandria Anger, MD  glucose blood (FORA V10 BLOOD GLUCOSE TEST) test strip Use as instructed bid. E11.65 Patient taking differently: 1 each by Other route in the morning and at bedtime.  05/25/17   Cassandria Anger, MD  glucose blood test strip Use as instructed Patient taking differently: 1 each by Other route See admin instructions. Use as instructed 06/25/15   Cassandria Anger, MD  Insulin Glargine (TOUJEO SOLOSTAR) 300 UNIT/ML SOPN Inject 70 Units into the skin at bedtime. Patient not taking: Reported on 07/12/2019 05/25/17   Cassandria Anger, MD  LANCETS ULTRA THIN MISC Test BG every morning Patient taking differently: 1 each by Other route in the morning.  02/26/16   Cassandria Anger, MD  LITETOUCH PEN NEEDLES 31G X 8 MM MISC USE AT BEDTIME AS DIRECTED. Patient taking differently: 1 each by Other route at bedtime.  12/14/15   Cassandria Anger, MD     Interim history/subjective:  Presently, the patient denies chest pain or shortness of breath.  There is no lightheadedness.  Objective   Blood pressure (!) 84/60, pulse 83,  temperature 98.3 F (36.8 C), temperature source Oral, resp. rate 12, height 6' (1.829 m), weight 74.8 kg, SpO2 100 %.        Intake/Output Summary (Last 24 hours) at 07/13/2019 1747 Last data filed at 07/13/2019 [id number] Gross per 24 hour  Intake 480 ml  Output 850 ml  Net -370 ml   Filed Weights   07/12/19 1246 07/13/19 0450  Weight: 76.2 kg 74.8 kg    Examination: Physical Exam Constitutional:      General: He is not in acute distress.    Appearance: He is well-developed. He is not ill-appearing or diaphoretic.     Comments: Slender build  HENT:     Head: Normocephalic and atraumatic.     Mouth/Throat:     Mouth: Mucous membranes are moist.  Eyes:  Extraocular Movements: Extraocular movements intact.  Cardiovascular:     Rate and Rhythm: Tachycardia present. Rhythm irregular.     Pulses: Normal pulses.     Heart sounds: S1 normal and S2 normal. Heart sounds are distant. Gallop present.      Comments: JVP is at 3 cm above sternal angle positive HJR Pulmonary:     Effort: Pulmonary effort is normal. No tachypnea or respiratory distress.     Breath sounds: Normal breath sounds. No rales.  Musculoskeletal:     Right lower leg: No edema.     Left lower leg: No edema.  Skin:    Capillary Refill: Capillary refill takes 2 to 3 seconds.  Neurological:     General: No focal deficit present.     Mental Status: He is alert and oriented to person, place, and time.      Ancillary tests (personally reviewed)  CBC: Recent Labs  Lab 07/12/19 1305 07/13/19 0430  WBC 8.2 9.1  HGB 12.9* 12.9*  HCT 39.8 38.6*  MCV 93.9 91.7  PLT 210 123XX123    Basic Metabolic Panel: Recent Labs  Lab 07/12/19 1305 07/13/19 0430  NA 134* 134*  K 4.7 4.6  CL 103 103  CO2 15* 18*  GLUCOSE 183* 157*  BUN 48* 48*  CREATININE 1.97* 1.88*  CALCIUM 9.4 9.4   GFR: Estimated Creatinine Clearance: 38.1 mL/min (A) (by C-G formula based on SCr of 1.88 mg/dL (H)). Recent Labs  Lab 07/12/19 1305  07/13/19 0430  WBC 8.2 9.1    Liver Function Tests: No results for input(s): AST, ALT, ALKPHOS, BILITOT, PROT, ALBUMIN in the last 168 hours. No results for input(s): LIPASE, AMYLASE in the last 168 hours. No results for input(s): AMMONIA in the last 168 hours.  ABG    Component Value Date/Time   PHART 7.384 05/18/2015 1051   PCO2ART 37.8 05/18/2015 1051   PO2ART 106 (H) 05/18/2015 1051   HCO3 22.1 05/18/2015 1051   TCO2 23.2 05/18/2015 1051   ACIDBASEDEF 2.2 (H) 05/18/2015 1051   O2SAT 97.5 05/18/2015 1051     Coagulation Profile: No results for input(s): INR, PROTIME in the last 168 hours.  Cardiac Enzymes: No results for input(s): CKTOTAL, CKMB, CKMBINDEX, TROPONINI in the last 168 hours.  HbA1C: Hemoglobin A1C  Date/Time Value Ref Range Status  11/03/2015 12:00 AM 7  Final   Hgb A1c MFr Bld  Date/Time Value Ref Range Status  05/19/2017 10:34 AM 8.6 (H) <5.7 % of total Hgb Final    Comment:    For someone without known diabetes, a hemoglobin A1c value of 6.5% or greater indicates that they may have  diabetes and this should be confirmed with a follow-up  test. . For someone with known diabetes, a value <7% indicates  that their diabetes is well controlled and a value  greater than or equal to 7% indicates suboptimal  control. A1c targets should be individualized based on  duration of diabetes, age, comorbid conditions, and  other considerations. . Currently, no consensus exists regarding use of hemoglobin A1c for diagnosis of diabetes for children. .   01/30/2017 09:40 AM 8.2 (H) <5.7 % of total Hgb Final    Comment:    For someone without known diabetes, a hemoglobin A1c value of 6.5% or greater indicates that they may have  diabetes and this should be confirmed with a follow-up  test. . For someone with known diabetes, a value <7% indicates  that their diabetes is well controlled  and a value  greater than or equal to 7% indicates suboptimal    control. A1c targets should be individualized based on  duration of diabetes, age, comorbid conditions, and  other considerations. . Currently, no consensus exists regarding use of hemoglobin A1c for diagnosis of diabetes for children. .     CBG: Recent Labs  Lab 07/12/19 1904 07/12/19 2131 07/13/19 0757 07/13/19 1120 07/13/19 1637  GLUCAP 140* 176* 155* 153* 131*     Assessment & Plan:   Medication induced hypotension secondary to amiodarone and furosemide in the context of ischemic cardiomyopathy and rapid atrial fibrillation. At the present the patient is asymptomatic and is clinically euvolemic. Rate control is acceptable. -There is no need at this time for vasopressors. -We will monitor in ICU overnight. -Would continue usual heart failure therapy as currently prescribed.  Daily Goals Checklist  Pain/Anxiety/Delirium protocol (if indicated): As needed VAP protocol (if indicated): Not intubated Respiratory support goals: Currently on room air Blood pressure target: Keep MAP greater than 65 DVT prophylaxis: On apixaban Nutritional status and feeding goals: Full cardiac diet GI prophylaxis: On pantoprazole Fluid status goals: Clinically euvolemic.  No diuresis for today Urinary catheter: Voiding spontaneously Central lines: Peripheral IVs only Glucose control: Euglycemic on insulin sliding scale Mobility/therapy needs: Up ad lib. Antibiotic de-escalation: No antibiotics Home medication reconciliation: Per cardiology Daily labs: BMP Code Status: Full Family Communication: Patient and wife updated in person Disposition: ICU  Kipp Brood, MD [facility] ICU Physician [place]  Pager: [phone] Mobile: [phone] After hours: [phone].  07/13/2019, 5:47 PM

## 2019-07-13 NOTE — Progress Notes (Signed)
Pt's transferred to [facility], report given to Judson Roch, RN

## 2019-07-14 ENCOUNTER — Inpatient Hospital Stay (HOSPITAL_COMMUNITY): Payer: Medicare Other

## 2019-07-14 ENCOUNTER — Encounter (HOSPITAL_COMMUNITY): Payer: Self-pay | Admitting: Anesthesiology

## 2019-07-14 ENCOUNTER — Inpatient Hospital Stay: Payer: Self-pay

## 2019-07-14 DIAGNOSIS — I25810 Atherosclerosis of coronary artery bypass graft(s) without angina pectoris: Secondary | ICD-10-CM

## 2019-07-14 DIAGNOSIS — I959 Hypotension, unspecified: Secondary | ICD-10-CM

## 2019-07-14 DIAGNOSIS — I4891 Unspecified atrial fibrillation: Secondary | ICD-10-CM

## 2019-07-14 DIAGNOSIS — I5022 Chronic systolic (congestive) heart failure: Secondary | ICD-10-CM

## 2019-07-14 DIAGNOSIS — E119 Type 2 diabetes mellitus without complications: Secondary | ICD-10-CM

## 2019-07-14 DIAGNOSIS — R570 Cardiogenic shock: Secondary | ICD-10-CM

## 2019-07-14 DIAGNOSIS — N179 Acute kidney failure, unspecified: Secondary | ICD-10-CM

## 2019-07-14 DIAGNOSIS — Z794 Long term (current) use of insulin: Secondary | ICD-10-CM

## 2019-07-14 LAB — COMPREHENSIVE METABOLIC PANEL
ALT: 58 U/L — ABNORMAL HIGH (ref 0–44)
AST: 55 U/L — ABNORMAL HIGH (ref 15–41)
Albumin: 3.4 g/dL — ABNORMAL LOW (ref 3.5–5.0)
Alkaline Phosphatase: 52 U/L (ref 38–126)
Anion gap: 12 (ref 5–15)
BUN: 53 mg/dL — ABNORMAL HIGH (ref 8–23)
CO2: 20 mmol/L — ABNORMAL LOW (ref 22–32)
Calcium: 9.2 mg/dL (ref 8.9–10.3)
Chloride: 104 mmol/L (ref 98–111)
Creatinine, Ser: 2.07 mg/dL — ABNORMAL HIGH (ref 0.61–1.24)
GFR calc Af Amer: 36 mL/min — ABNORMAL LOW (ref >60)
GFR calc non Af Amer: 31 mL/min — ABNORMAL LOW (ref >60)
Glucose, Bld: 118 mg/dL — ABNORMAL HIGH (ref 70–99)
Potassium: 4.9 mmol/L (ref 3.5–5.1)
Sodium: 136 mmol/L (ref 135–145)
Total Bilirubin: 0.8 mg/dL (ref 0.3–1.2)
Total Protein: 6.6 g/dL (ref 6.5–8.1)

## 2019-07-14 LAB — GLUCOSE, CAPILLARY
Glucose-Capillary: 82 mg/dL (ref 70–99)
Glucose-Capillary: 112 mg/dL — ABNORMAL HIGH (ref 70–99)
Glucose-Capillary: 161 mg/dL — ABNORMAL HIGH (ref 70–99)
Glucose-Capillary: 170 mg/dL — ABNORMAL HIGH (ref 70–99)

## 2019-07-14 LAB — CBC
HCT: 37.7 % — ABNORMAL LOW (ref 39.0–52.0)
Hemoglobin: 12.6 g/dL — ABNORMAL LOW (ref 13.0–17.0)
MCH: 30.5 pg (ref 26.0–34.0)
MCHC: 33.4 g/dL (ref 30.0–36.0)
MCV: 91.3 fL (ref 80.0–100.0)
Platelets: 219 10*3/uL (ref 150–400)
RBC: 4.13 MIL/uL — ABNORMAL LOW (ref 4.22–5.81)
RDW: 14.4 % (ref 11.5–15.5)
WBC: 8.5 10*3/uL (ref 4.0–10.5)
nRBC: 0 % (ref 0.0–0.2)

## 2019-07-14 LAB — LACTIC ACID, PLASMA
Lactic Acid, Venous: 1.3 mmol/L (ref 0.5–1.9)
Lactic Acid, Venous: 1.3 mmol/L (ref 0.5–1.9)

## 2019-07-14 LAB — BRAIN NATRIURETIC PEPTIDE: B Natriuretic Peptide: 1176.1 pg/mL — ABNORMAL HIGH (ref 0.0–100.0)

## 2019-07-14 LAB — APTT
aPTT: 44 seconds — ABNORMAL HIGH (ref 24–36)
aPTT: >200 seconds (ref 24–36)
aPTT: 116 seconds — ABNORMAL HIGH (ref 24–36)

## 2019-07-14 LAB — COOXEMETRY PANEL
Carboxyhemoglobin: 0.8 % (ref 0.5–1.5)
Carboxyhemoglobin: 0.8 % (ref 0.5–1.5)
Methemoglobin: 1.3 % (ref 0.0–1.5)
Methemoglobin: 1.3 % (ref 0.0–1.5)
O2 Saturation: 37.4 %
O2 Saturation: 43.9 %
Total hemoglobin: 12.1 g/dL (ref 12.0–16.0)
Total hemoglobin: 11.7 g/dL — ABNORMAL LOW (ref 12.0–16.0)

## 2019-07-14 MED ORDER — AMIODARONE IV BOLUS ONLY 150 MG/100ML
150.0000 mg | Freq: Once | INTRAVENOUS | Status: DC
  Filled 2019-07-14: qty 100

## 2019-07-14 MED ORDER — MILRINONE LACTATE IN DEXTROSE 20-5 MG/100ML-% IV SOLN
0.1250 ug/kg/min | INTRAVENOUS | Status: DC
  Administered 2019-07-14 – 2019-07-18 (×3): 0.25 ug/kg/min via INTRAVENOUS
  Administered 2019-07-19 – 2019-07-22 (×5): 0.375 ug/kg/min via INTRAVENOUS
  Administered 2019-07-23: 0.125 ug/kg/min via INTRAVENOUS
  Filled 2019-07-14 (×15): qty 100

## 2019-07-14 MED ORDER — AMIODARONE LOAD VIA INFUSION
150.0000 mg | Freq: Once | INTRAVENOUS | Status: AC
  Administered 2019-07-14: 150 mg via INTRAVENOUS
  Filled 2019-07-14: qty 83.34

## 2019-07-14 MED ORDER — AMIODARONE HCL IN DEXTROSE 360-4.14 MG/200ML-% IV SOLN
60.0000 mg/h | INTRAVENOUS | Status: DC
  Administered 2019-07-14 (×2): 60 mg/h via INTRAVENOUS
  Filled 2019-07-14 (×2): qty 200

## 2019-07-14 MED ORDER — POLYETHYLENE GLYCOL 3350 17 G PO PACK
17.0000 g | PACK | Freq: Every day | ORAL | Status: DC
  Administered 2019-07-14 – 2019-07-18 (×3): 17 g via ORAL
  Filled 2019-07-14 (×12): qty 1

## 2019-07-14 MED ORDER — AMIODARONE HCL IN DEXTROSE 360-4.14 MG/200ML-% IV SOLN
30.0000 mg/h | INTRAVENOUS | Status: DC
  Administered 2019-07-15 – 2019-07-23 (×13): 30 mg/h via INTRAVENOUS
  Filled 2019-07-14 (×20): qty 200

## 2019-07-14 MED ORDER — SODIUM CHLORIDE 0.9 % IV SOLN: INTRAVENOUS | Status: DC | PRN

## 2019-07-14 MED ORDER — DOCUSATE SODIUM 100 MG PO CAPS
100.0000 mg | ORAL_CAPSULE | Freq: Every day | ORAL | Status: DC
  Administered 2019-07-14 – 2019-07-29 (×14): 100 mg via ORAL
  Filled 2019-07-14 (×14): qty 1

## 2019-07-14 MED ORDER — SODIUM CHLORIDE 0.9% FLUSH: 10.0000 mL | INTRAVENOUS | Status: DC | PRN

## 2019-07-14 MED ORDER — SODIUM CHLORIDE 0.9% FLUSH
10.0000 mL | Freq: Two times a day (BID) | INTRAVENOUS | Status: DC
  Administered 2019-07-14 – 2019-07-17 (×3): 10 mL
  Administered 2019-07-18: 20 mL
  Administered 2019-07-18 – 2019-07-29 (×16): 10 mL

## 2019-07-14 MED ORDER — HEPARIN (PORCINE) 25000 UT/250ML-% IV SOLN
1000.0000 [IU]/h | INTRAVENOUS | Status: DC
  Administered 2019-07-14: 1100 [IU]/h via INTRAVENOUS
  Filled 2019-07-14 (×2): qty 250

## 2019-07-14 NOTE — Plan of Care (Signed)
  Problem: Health Behavior/Discharge Planning: Goal: Ability to manage health-related needs will improve Outcome: Progressing   Problem: Clinical Measurements: Goal: Ability to maintain clinical measurements within normal limits will improve Outcome: Progressing Goal: Diagnostic test results will improve Outcome: Progressing Goal: Respiratory complications will improve Outcome: Progressing Goal: Cardiovascular complication will be avoided Outcome: Progressing   Problem: Activity: Goal: Risk for activity intolerance will decrease Outcome: Progressing   Problem: Coping: Goal: Level of anxiety will decrease Outcome: Progressing   Problem: Elimination: Goal: Will not experience complications related to urinary retention Outcome: Progressing   Problem: Pain Managment: Goal: General experience of comfort will improve Outcome: Progressing   Problem: Safety: Goal: Ability to remain free from injury will improve Outcome: Progressing   Problem: Skin Integrity: Goal: Risk for impaired skin integrity will decrease Outcome: Progressing   Problem: Activity: Goal: Capacity to carry out activities will improve Outcome: Progressing   Problem: Cardiac: Goal: Ability to achieve and maintain adequate cardiopulmonary perfusion will improve Outcome: Progressing

## 2019-07-14 NOTE — Progress Notes (Signed)
[place] for apixaban>>heparin Indication: atrial fibrillation  No Known Allergies  Patient Measurements: Height: 6' (182.9 cm) Weight: 166 lb 14.2 oz (75.7 kg) IBW/kg (Calculated) : 77.6 Heparin Dosing Weight: 76  Vital Signs: Temp: 97.6 F (36.4 C) (03/28 2000) Temp Source: Oral (03/28 2000) BP: 101/55 (03/28 2000) Pulse Rate: 87 (03/28 2000)  Labs: Recent Labs    07/12/19 1305 07/12/19 1305 07/12/19 1523 07/13/19 0430 07/14/19 0142 07/14/19 1005 07/14/19 1829 07/14/19 1946  HGB 12.9*   < >  --  12.9* 12.6*  --   --   --   HCT 39.8  --   --  38.6* 37.7*  --   --   --   PLT 210  --   --  216 219  --   --   --   APTT  --   --   --   --   --  44* >200* 116*  CREATININE 1.97*  --   --  1.88* 2.07*  --   --   --   TROPONINIHS 150*  --  156*  --   --   --   --   --    < > = values in this interval not displayed.    Estimated Creatinine Clearance: 35 mL/min (A) (by C-G formula based on SCr of 2.07 mg/dL (H)).   Medical History: Past Medical History:  Diagnosis Date  . Abdominal aortic aneurysm ([place])   . AICD (automatic cardioverter/defibrillator) present    St Jude  . Anemia    iron deficiency anemia,takes iron pill daily  . Arthritis   . Coronary artery disease    AWMI 2001  . Diverticulosis   . GERD (gastroesophageal reflux disease)    takes Protonix daily  . History of colon polyps    benign  . History of migraine 40 yrs ago  . History of shingles   . Hyperlipidemia    takes Atorvastatin and Niacin daily  . Hypertension    takes Diovan,Spironolactone, and Carvedilol daily  . ICD (implantable cardioverter-defibrillator), single, in situ   . Ischemic cardiomyopathy   . Joint swelling   . LV dysfunction    takes Digoxin daily  . Myocardial infarction ([place]) 2001  . Pneumonia 40 yrs ago   hx of  . Shortness of breath dyspnea    with exertion.States he was a smoker for yr  . Status post coronary artery bypass  grafting   . Type 2 diabetes mellitus (HCC)    takes Glipizide,Metformin,Invokana,and Lantus daily.Average fasting blood sugar runs about 140    Assessment: 74 year old male with new onset afib recently started on apixaban 3/26, received 3 doses with last on 3/27pm.  Concern for low output and cardiogenic shock, apixaban has been switched to IV heparin in case procedures are needed. Will utilize aptt's to monitor and dose adjust for the next couple of days until heparin levels and aptt correlate.    PM f/u> aPTT initially reported as > 200, redrawn to verify = 116.  Goal of Therapy:  Aptt goal 66-102s Heparin level 0.3-0.7 units/ml Monitor platelets by anticoagulation protocol: Yes   Plan:  Reduce IV heparin to 1000 units/hr.   Recheck heparin level and aPTT in AM. Continue to monitor H&H and platelets  [place] Crane, Roylene Reason, [facility] Clinical Pharmacist Phone [phone]  07/14/2019 9:36 PM

## 2019-07-14 NOTE — Progress Notes (Signed)
Two RT's attempted aline per order. Aline was unsuccessful. RN made aware to let MD know.

## 2019-07-14 NOTE — Progress Notes (Signed)
Peripherally Inserted Central Catheter Placement  The IV Nurse has discussed with the patient and/or persons authorized to consent for the patient, the purpose of this procedure and the potential benefits and risks involved with this procedure.  The benefits include less needle sticks, lab draws from the catheter, and the patient may be discharged home with the catheter. Risks include, but not limited to, infection, bleeding, blood clot (thrombus formation), and puncture of an artery; nerve damage and irregular heartbeat and possibility to perform a PICC exchange if needed/ordered by physician.  Alternatives to this procedure were also discussed.  Bard Power PICC patient education guide, fact sheet on infection prevention and patient information card has been provided to patient /or left at bedside.    PICC Placement Documentation  PICC Double Lumen [id number] PICC Right Basilic (Active)  Indication for Insertion or Continuance of Line Prolonged intravenous therapies 07/14/19 1300  Exposed Catheter (cm) 0 cm 07/14/19 1300  Site Assessment Clean;Dry;Intact 07/14/19 1300  Lumen #1 Status Flushed;Blood return noted;Saline locked 07/14/19 1300  Lumen #2 Status Flushed;Blood return noted;Saline locked 07/14/19 1300  Dressing Type Transparent 07/14/19 1300  Dressing Status Clean;Dry;Intact;Antimicrobial disc in place 07/14/19 [place] checked and tightened 07/14/19 1300  Dressing Change Due 07/21/19 07/14/19 [place], Keenan Bachelor 07/14/2019, 1:28 PM

## 2019-07-14 NOTE — Plan of Care (Signed)
Afebrile. Arterial line and PICC line placed this shift. Patient started on milrinone, heparin and amiodarone gtts. Tolerating well at this time. No c/o pain. O x 4. Uses urinal. Miralax and colace ordered as atient has not had a BM in a few days. Remains on RA. Good appetite. CBG well controlled. Family at bedside this afternoon.  Problem: Pain Managment: Goal: General experience of comfort will improve Outcome: Progressing   Problem: Education: Goal: Ability to verbalize understanding of medication therapies will improve Outcome: Not Progressing   Problem: Cardiac: Goal: Ability to achieve and maintain adequate cardiopulmonary perfusion will improve Outcome: Not Progressing

## 2019-07-14 NOTE — Consult Note (Signed)
Advanced Heart Failure Team Consult Note   Primary Physician: Redmond School, MD PCP-Cardiologist:  Sanda Klein, MD  Referring: Dr. Audie Box  Reason for Consultation: Cardiogenic shock  HPI:    Jordan Ross is seen today for evaluation of cardiogenic shock at the request of Dr. Jenetta Downer' Jordan Ross.   [name] is a 74 y.o. male with CAD s/p CABG in 2001 (LIMA-LAD, SVG to ramus, circumflex, and PDA), HTN, Chronic systolic HF due to iCM, S/p st. Jude single chamber ICD, AAA s/p EVAR by Dr. Trula Slade, DM and HLD.  He had an anterior MI 2001 treated with coronary bypass grafting by Dr. Ladona Mow, His EF at that time was 35%. He subsequently underwent ICD implantation for primary prevention 10/10.   Echo in 2013 EF 30-35%. Has not seen Dr. Loletha Grayer since 12/19.   Seen in ED with HF symptoms on 06/19/19. Treated with IV lasix. ECG at that time sinus with PVCs.  Seen Virtually by APP 06/21/19 with improved symptoms on lasix 20mg  qd with 9lb weight loss since ER visit. He was again seen in clinic 07/04/19 by APP. Breathing stable.   Over past week increasing SOB and fatigue. Presented to PCP on 3/26 and found to have AKI and hyperkalemia so sent to ER. In ER had wide complex tachycardia at 140. Seen by Dr. Rayann Heman. Device interrogation showed persistent tachycardia for previous 8 days and felt to have AFL with aberrant conduction. hstrop negative. Creatinine up from 1.27 -> 1.97. Was also volume overloaded.   Echo 3/27 EF 10-15% RV not seen well but felt to be down.   Started on amio and IV lasix but developed hypotension and AKI. PICC placed and co-ox 37%. CVP 15. Started on milrinone 0.25  Says he is feeling ok at rest but very fatigued and SOB with any activity. Remains in AFL 100-115. Denies any palpitations. Has never had AF before.     Review of Systems: [y] = yes, [ ]  = no   . General: Weight gain [ ] ; Weight loss [ ] ; Anorexia [ y]; Fatigue Blue.Reese ]; Fever [ ] ; Chills [ ] ; Weakness Blue.Reese ]   . Cardiac: Chest pain/pressure [ ] ; Resting SOB [ ] ; Exertional SOB [ y]; Orthopnea [ ] ; Pedal Edema Blue.Reese ]; Palpitations [ ] ; Syncope [ ] ; Presyncope [ ] ; Paroxysmal nocturnal dyspnea[ ]   . Pulmonary: Cough [ ] ; Wheezing[ ] ; Hemoptysis[ ] ; Sputum [ ] ; Snoring [ ]   . GI: Vomiting[ ] ; Dysphagia[ ] ; Melena[ ] ; Hematochezia [ ] ; Heartburn[ ] ; Abdominal pain [ ] ; Constipation [ ] ; Diarrhea [ ] ; BRBPR [ ]   . GU: Hematuria[ ] ; Dysuria [ ] ; Nocturia[ ]   . Vascular: Pain in legs with walking [ ] ; Pain in feet with lying flat [ ] ; Non-healing sores [ ] ; Stroke [ ] ; TIA [ ] ; Slurred speech [ ] ;  . Neuro: Headaches[ ] ; Vertigo[ ] ; Seizures[ ] ; Paresthesias[ ] ;Blurred vision [ ] ; Diplopia [ ] ; Vision changes [ ]   . Ortho/Skin: Arthritis Blue.Reese ]; Joint pain Blue.Reese ]; Muscle pain [ ] ; Joint swelling [ ] ; Back Pain [ ] ; Rash [ ]   . Psych: Depression[ ] ; Anxiety[ ]   . Heme: Bleeding problems [ ] ; Clotting disorders [ ] ; Anemia [ ]   . Endocrine: Diabetes [ ] ; Thyroid dysfunction[ ]   Home Medications Prior to Admission medications   Medication Sig Start Date End Date Taking? Authorizing Provider  acetaminophen (TYLENOL) 325 MG tablet Take 975 mg by mouth daily as needed (back pain).  Yes [provider]  albuterol (VENTOLIN HFA) 108 (90 Base) MCG/ACT inhaler Inhale 1-2 puffs into the lungs every 4 (four) hours as needed for wheezing or shortness of breath.  06/07/19  Yes [provider]  aspirin EC 81 MG tablet Take 81 mg by mouth at bedtime.   Yes [provider]  atorvastatin (LIPITOR) 80 MG tablet Take 80 mg by mouth daily.   Yes [provider]  ferrous sulfate 325 (65 FE) MG tablet Take 325 mg by mouth daily with breakfast.   Yes [provider]  furosemide (LASIX) 20 MG tablet Take 20 mg by mouth daily. 06/17/19  Yes [provider]  glipiZIDE (GLUCOTROL) 10 MG tablet Take 10 mg by mouth 2 (two) times daily before a meal.    Yes [provider]  insulin  glargine (LANTUS SOLOSTAR) 100 UNIT/ML Solostar Pen Inject 30 Units into the skin at bedtime.   Yes [provider]  meloxicam (MOBIC) 15 MG tablet Take 15 mg by mouth daily.   Yes [provider]  metFORMIN (GLUCOPHAGE) 500 MG tablet Take 1,000 mg by mouth 2 (two) times daily with a meal.  03/25/13  Yes [provider]  metolazone (ZAROXOLYN) 2.5 MG tablet Take 2.5 mg by mouth daily. 07/11/19  Yes [provider]  Multiple Vitamin (MULTIVITAMIN WITH MINERALS) TABS tablet Take 1 tablet by mouth daily.   Yes [provider]  pantoprazole (PROTONIX) 40 MG tablet Take 40 mg by mouth daily. 03/25/13  Yes [provider]  sacubitril-valsartan (ENTRESTO) 49-51 MG Take 1 tablet by mouth 2 (two) times daily.   Yes [provider]  FORA LANCETS MISC Test bid as directed. E11.65. Patient taking differently: 1 each by Other route in the morning and at bedtime.  04/08/16   Cassandria Anger, MD  glucose blood (FORA V10 BLOOD GLUCOSE TEST) test strip Use as instructed bid. E11.65 Patient taking differently: 1 each by Other route in the morning and at bedtime.  05/25/17   Cassandria Anger, MD  glucose blood test strip Use as instructed Patient taking differently: 1 each by Other route See admin instructions. Use as instructed 06/25/15   Cassandria Anger, MD  Insulin Glargine (TOUJEO SOLOSTAR) 300 UNIT/ML SOPN Inject 70 Units into the skin at bedtime. Patient not taking: Reported on 07/12/2019 05/25/17   Cassandria Anger, MD  LANCETS ULTRA THIN MISC Test BG every morning Patient taking differently: 1 each by Other route in the morning.  02/26/16   Cassandria Anger, MD  LITETOUCH PEN NEEDLES 31G X 8 MM MISC USE AT BEDTIME AS DIRECTED. Patient taking differently: 1 each by Other route at bedtime.  12/14/15   Cassandria Anger, MD    Past Medical History: Past Medical History:  Diagnosis Date  . Abdominal aortic aneurysm ([place])   .  AICD (automatic cardioverter/defibrillator) present    St Jude  . Anemia    iron deficiency anemia,takes iron pill daily  . Arthritis   . Coronary artery disease    AWMI 2001  . Diverticulosis   . GERD (gastroesophageal reflux disease)    takes Protonix daily  . History of colon polyps    benign  . History of migraine 40 yrs ago  . History of shingles   . Hyperlipidemia    takes Atorvastatin and Niacin daily  . Hypertension    takes Diovan,Spironolactone, and Carvedilol daily  . ICD (implantable cardioverter-defibrillator), single, in situ   . Ischemic  cardiomyopathy   . Joint swelling   . LV dysfunction    takes Digoxin daily  . Myocardial infarction ([place]) 2001  . Pneumonia 40 yrs ago   hx of  . Shortness of breath dyspnea    with exertion.States he was a smoker for yr  . Status post coronary artery bypass grafting   . Type 2 diabetes mellitus ([facility])    takes Glipizide,Metformin,Invokana,and Lantus daily.Average fasting blood sugar runs about 140    Past Surgical History: Past Surgical History:  Procedure Laterality Date  . ABDOMINAL AORTIC ENDOVASCULAR STENT GRAFT N/A 05/22/2015   Procedure: ABDOMINAL AORTIC ENDOVASCULAR STENT GRAFT;  Surgeon: Serafina Mitchell, MD;  Location: [facility];  Service: Vascular;  Laterality: N/A;  . CARDIAC CATHETERIZATION  2001  . COLONOSCOPY N/A 03/21/2014   Procedure: COLONOSCOPY;  Surgeon: Rogene Houston, MD;  [facility];  Service: Endoscopy;  Laterality: N/A;  855 - moved to 12/4 @ 8:30 - Ann notified pt  . CORONARY ARTERY BYPASS GRAFT  2001   unsure if it was 3 or 4  . DOPPLER ECHOCARDIOGRAPHY     2011 38% EF.2013 EF 30-35%    Family History: Family History  Problem Relation Age of Onset  . COPD Mother   . Heart disease Brother     Social History: Social History   Socioeconomic History  . Marital status: Widowed    Spouse name: Not on file  . Number of children: Not on file  . Years of education: Not on file  .  Highest education level: Not on file  Occupational History  . Not on file  Tobacco Use  . Smoking status: Former Smoker    Years: 40.00  . Smokeless tobacco: Never Used  . Tobacco comment: quit smoking in 2001  Substance and Sexual Activity  . Alcohol use: No  . Drug use: No  . Sexual activity: Yes  Other Topics Concern  . Not on file  Social History Narrative  . Not on file   Social Determinants of Health   Financial Resource Strain:   . Difficulty of Paying Living Expenses:   Food Insecurity:   . Worried About Charity fundraiser in the Last Year:   . Arboriculturist in the Last Year:   Transportation Needs:   . Film/video editor (Medical):   [name] Lack of Transportation (Non-Medical):   Physical Activity:   . Days of Exercise per Week:   . Minutes of Exercise per Session:   Stress:   . Feeling of Stress :   Social Connections:   . Frequency of Communication with Friends and Family:   . Frequency of Social Gatherings with Friends and Family:   . Attends Religious Services:   . Active Member of Clubs or Organizations:   . Attends Archivist Meetings:   [name] Marital Status:     Allergies:  No Known Allergies  Objective:    Vital Signs:   Temp:  [96.6 F (35.9 C)-98.2 F (36.8 C)] 98.2 F (36.8 C) (03/28 1600) Pulse Rate:  [65-111] 100 (03/28 1700) Resp:  [0-36] 20 (03/28 1700) BP: (72-124)/(44-97) 124/97 (03/28 1700) SpO2:  [68 %-100 %] 98 % (03/28 1700) Arterial Line BP: (102-132)/(52-66) 119/64 (03/28 1700) Weight:  [75.7 kg] 75.7 kg (03/28 0404) Last BM Date: 07/10/19  Weight change: Filed Weights   07/12/19 1246 07/13/19 0450 07/14/19 0404  Weight: 76.2 kg 74.8 kg 75.7 kg    Intake/Output:  Intake/Output Summary (Last 24 hours) at 07/14/2019 1837 Last data filed at 07/14/2019 1700 Gross per 24 hour  Intake 810.84 ml  Output 1175 ml  Net -364.16 ml      Physical Exam    General:  Sitting up in bed. No resp difficulty HEENT:  normal Neck: supple. JVP to jaw . Carotids 2+ bilat; no bruits. No lymphadenopathy or thyromegaly appreciated. Cor: PMI nondisplaced. Irregular rate & rhythm. No rubs, gallops or murmurs. +s3 Lungs: clear Abdomen: soft, nontender, nondistended. No hepatosplenomegaly. No bruits or masses. Good bowel sounds. Extremities: no cyanosis, clubbing, rash, tr edema. Cool  Neuro: alert & orientedx3, cranial nerves grossly intact. moves all 4 extremities w/o difficulty. Affect pleasant   Telemetry   AFL 100-115 Personally reviewed  EKG    AF 115 with IVCD Personally reviewed   Labs   Basic Metabolic Panel: Recent Labs  Lab 07/12/19 1305 07/13/19 0430 07/14/19 0142  NA 134* 134* 136  K 4.7 4.6 4.9  CL 103 103 104  CO2 15* 18* 20*  GLUCOSE 183* 157* 118*  BUN 48* 48* 53*  CREATININE 1.97* 1.88* 2.07*  CALCIUM 9.4 9.4 9.2    Liver Function Tests: Recent Labs  Lab 07/14/19 0142  AST 55*  ALT 58*  ALKPHOS 52  BILITOT 0.8  PROT 6.6  ALBUMIN 3.4*   No results for input(s): LIPASE, AMYLASE in the last 168 hours. No results for input(s): AMMONIA in the last 168 hours.  CBC: Recent Labs  Lab 07/12/19 1305 07/13/19 0430 07/14/19 0142  WBC 8.2 9.1 8.5  HGB 12.9* 12.9* 12.6*  HCT 39.8 38.6* 37.7*  MCV 93.9 91.7 91.3  PLT 210 216 219    Cardiac Enzymes: No results for input(s): CKTOTAL, CKMB, CKMBINDEX, TROPONINI in the last 168 hours.  BNP: BNP (last 3 results) Recent Labs    06/19/19 1511 07/12/19 1323 07/14/19 0142  BNP 1,000.0* 1,976.9* 1,176.1*    ProBNP (last 3 results) No results for input(s): PROBNP in the last 8760 hours.   CBG: Recent Labs  Lab 07/13/19 1120 07/13/19 1637 07/14/19 0643 07/14/19 1137 07/14/19 1607  GLUCAP 153* 131* 82 112* 161*    Coagulation Studies: No results for input(s): LABPROT, INR in the last 72 hours.   Imaging   DG CHEST PORT 1 VIEW  Result Date: 07/14/2019 CLINICAL DATA:  PICC placement EXAM: PORTABLE CHEST  1 VIEW COMPARISON:  Chest radiograph from earlier today. FINDINGS: Stable configuration of single lead left subclavian ICD and intact sternotomy wires with CABG clips over the mediastinum. Right PICC terminates over the cavoatrial junction. Stable cardiomediastinal silhouette with mild cardiomegaly. No pneumothorax. No pleural effusion. Cephalization of the pulmonary vasculature without overt pulmonary edema. No acute consolidative airspace disease. IMPRESSION: 1. Right PICC terminates over the cavoatrial junction. 2. Stable mild cardiomegaly without overt pulmonary edema. Electronically Signed   By: Ilona Sorrel M.D.   On: 07/14/2019 13:52   DG CHEST PORT 1 VIEW  Result Date: 07/14/2019 CLINICAL DATA:  Congestive heart EXAM: PORTABLE CHEST 1 VIEW COMPARISON:  07/12/2019 FINDINGS: Bilateral diffuse interstitial thickening. No pleural effusion or pneumothorax. Pleural plaque along the left diaphragm. Stable cardiomegaly. Prior CABG. Single lead cardiac pacemaker. No acute osseous abnormality. IMPRESSION: Findings concerning for mild CHF. Electronically Signed   By: Kathreen Devoid   On: 07/14/2019 09:17   [place] EKG SITE RITE  Result Date: 07/14/2019 If Site Rite image not attached, placement could not be confirmed due to current cardiac rhythm.  Medications:     Current Medications: . aspirin  81 mg Oral Daily  . atorvastatin  40 mg Oral Daily  . Chlorhexidine Gluconate Cloth  6 each Topical Daily  . docusate sodium  100 mg Oral Daily  . insulin aspart  0-9 Units Subcutaneous TID WC  . insulin glargine  30 Units Subcutaneous QHS  . pantoprazole  40 mg Oral Daily  . polyethylene glycol  17 g Oral Daily  . sodium chloride flush  10-40 mL Intracatheter Q12H  . sodium chloride flush  3 mL Intravenous Once     Infusions: . sodium chloride    . amiodarone 60 mg/hr (07/14/19 1833)   Followed by  . amiodarone    . heparin 1,100 Units/hr (07/14/19 1700)  . milrinone 0.25 mcg/kg/min (07/14/19  1700)     Assessment/Plan   1. Acute on chronic systolic HF -> cardiogenic shock - due to mixed iCM/NICM - baseline EF 30-35% due to iCM. EF this admit 10-15% due to tachy-induced CM - now volume overloaded and low output with co-ox 37% - agree with milrinone and IV diuresis - no digoxin yet with AKI - agree with TEE/DC-CV this admit - no b-blocker with low output - No ARNI/ARB with AKI  - worsening CM likely due to AF but cannot exclude worsening CAD. May need cath at some point  2. Afib/flutler, Persistent - with RVR. - likely source of CM - resume amio - continue amio and heparin for now - TEE/DC-CV when more stable  3. AKI - baseline creatinine ~1.2 - now 2.1. likely due to shock/cardiorenal syndrome  4. CAD s/p CABG - s/p anterior MI in 2001 followed by CABG - no recent cath but denies angina - medical therapy for now in setting of AKI     Length of Stay: 2  Glori Bickers, MD  07/14/2019, 6:37 PM  Advanced Heart Failure Team Pager [phone] (M-F; 7a - 4p)  Please contact [facility] for night-coverage after hours (4p -7a ) and weekends on [email or web address]

## 2019-07-14 NOTE — Anesthesia Preprocedure Evaluation (Deleted)
Anesthesia Evaluation  Patient identified by MRN, date of birth, ID band Patient awake    Reviewed: Allergy & Precautions, NPO status , Patient's Chart, lab work & pertinent test results  Airway Mallampati: II  TM Distance: >3 FB Neck ROM: Full    Dental  (+) Upper Dentures, Chipped, Missing, Dental Advisory Given,    Pulmonary shortness of breath, pneumonia, former smoker,    breath sounds clear to auscultation       Cardiovascular hypertension, Pt. on medications and Pt. on home beta blockers + CAD, + Past MI, + CABG and + Peripheral Vascular Disease  + Cardiac Defibrillator + Valvular Problems/Murmurs AS and MR  Rhythm:Regular Rate:Normal  Echo 07/13/2019 1. Left ventricular ejection fraction, by estimation, is <20%. The left ventricle has severely decreased function. The left ventricle demonstrates regional wall motion abnormalities (see scoring diagram/findings for  description). The left ventricular internal cavity size was moderately dilated. There is mild left ventricular hypertrophy. Left ventricular diastolic parameters are  indeterminate.  2. RV poorly visualized. Grossly appears enlarged with decreased systolic function. Right ventricular systolic function was not well visualized. The right ventricular size is not well visualized. There is moderately elevated pulmonary artery systolic pressure.  3. Left atrial size was mildly dilated.  4. The MV vena contracta is 0.6 cm. . The mitral valve is abnormal. Moderate to severe mitral valve regurgitation. No evidence of mitral stenosis.  5. The aortic valve is tricuspid. Aortic valve regurgitation is not visualized. Low flow low gradient moderate aortic stenosis.  6. Aortic dilatation noted. There is mild dilatation of the aortic root and of the ascending aorta measuring 42 mm.  7. The inferior vena cava is dilated in size with <50% respiratory variability, suggesting right  atrial pressure of 15 mmHg.    Neuro/Psych negative neurological ROS  negative psych ROS   GI/Hepatic Neg liver ROS, GERD  Medicated and Controlled,  Endo/Other  diabetes, Type 2, Insulin Dependent  Renal/GU Renal disease  negative genitourinary   Musculoskeletal  (+) Arthritis , Osteoarthritis,    Abdominal   Peds negative pediatric ROS (+)  Hematology negative hematology ROS (+)   Anesthesia Other Findings   Reproductive/Obstetrics negative OB ROS                             Lab Results  Component Value Date   WBC 8.5 07/14/2019   HGB 12.6 (L) 07/14/2019   HCT 37.7 (L) 07/14/2019   MCV 91.3 07/14/2019   PLT 219 07/14/2019   Lab Results  Component Value Date   CREATININE 2.07 (H) 07/14/2019   BUN 53 (H) 07/14/2019   NA 136 07/14/2019   K 4.9 07/14/2019   CL 104 07/14/2019   CO2 20 (L) 07/14/2019   Lab Results  Component Value Date   INR 1.20 05/22/2015   INR 1.06 05/18/2015   11/2014 EKG: sinus bradycardia, LVH .    Anesthesia Physical  Anesthesia Plan  ASA: IV  Anesthesia Plan: General   Post-op Pain Management:    Induction:   PONV Risk Score and Plan: 2 and Propofol infusion, TIVA and Treatment may vary due to age or medical condition  Airway Management Planned: Natural Airway and Mask  Additional Equipment:   Intra-op Plan:   Post-operative Plan:   Informed Consent: I have reviewed the patients History and Physical, chart, labs and discussed the procedure including the risks, benefits and alternatives for the proposed anesthesia with  the patient or authorized representative who has indicated his/her understanding and acceptance.     Dental advisory given  Plan Discussed with: CRNA  Anesthesia Plan Comments:         Anesthesia Quick Evaluation

## 2019-07-14 NOTE — Progress Notes (Signed)
CRITICAL VALUE ALERT  Critical Value:  APTT >200  Date & Time Notied:  03/28 @1930   Provider Notified: N/A  Orders Received/Actions taken: Recollect another aPTT. Will continue to monitor.

## 2019-07-14 NOTE — Progress Notes (Signed)
PICC line placed with concerns for cardiogenic shock. MVO2 37%. I have started milrinone 0.25 mcg/min/kg to increase his CO. I placed an arterial line emergently for critical monitoring of blood pressure and cardiogenic shock. I discussed his case with Dr. Missy Sabins. We will plan to restart amiodarone drip. He will remain on heparin drip. We will trend MVO2 for goal >60. He has no infectious symptoms and I suspect this is all low output from progression of his cardiomyopathy. I suspect he will need repeat left/right heart cath soon. Will defer that timing to Advanced Heart Failure team.   CRITICAL CARE Performed by: [place] Bells T O'Neal  Total critical care time: 45 minutes. Critical care time was exclusive of separately billable procedures and treating other patients. Critical care was necessary to treat or prevent imminent or life-threatening deterioration. Critical care was time spent personally by me on the following activities: development of treatment plan with patient and/or surrogate as well as nursing, discussions with consultants, evaluation of patient's response to treatment, examination of patient, obtaining history from patient or surrogate, ordering and performing treatments and interventions, ordering and review of laboratory studies, ordering and review of radiographic studies, pulse oximetry and re-evaluation of patient's condition.  Signed, Addison Naegeli. Audie Box, [place]  07/14/2019 3:49 PM

## 2019-07-14 NOTE — Procedures (Signed)
Arterial Catheter Insertion Procedure Note Jordan Ross [id number] 09-29-1945  Procedure: Insertion of Arterial Catheter  Indications: cardiogenic shock  Procedure Details Consent: Unable to obtain consent because of emergent medical necessity. Time Out: Verified patient identification, verified procedure, site/side was marked, verified correct patient position, special equipment/implants available, medications/allergies/relevent history reviewed, required imaging and test results available.  Performed  Maximum sterile technique was used including antiseptics, cap, gloves, gown, hand hygiene, mask and sheet. Skin prep: Chlorhexidine; local anesthetic administered 20 gauge catheter was inserted into left radial artery using the Seldinger technique. ULTRASOUND GUIDANCE USED: YES Evaluation Blood flow good; BP tracing good. Complications: No apparent complications.   L[place] Bells T O'Neal 07/14/2019

## 2019-07-14 NOTE — Progress Notes (Signed)
Cardiology Progress Note  Patient ID: Jordan Ross MRN: [id number] DOB: October 18, 1945 Date of Encounter: 07/14/2019  Primary Cardiologist: Sanda Klein, MD  Subjective  Transferred to ICU yesterday for hypotension.  Blood pressures remain persistently soft with narrow pulse pressure.  Amiodarone was held.  I have stopped his Eliquis.  We will plan to start a heparin drip.  We will plan for PICC line insertion as well as A-line.  He denies chest pain or shortness of breath.  Reports he he gets short of breath when he tries to exert himself.  Blood pressure 85/60 when I was in the room.  ROS:  All other ROS reviewed and negative. Pertinent positives noted in the HPI.     Inpatient Medications  Scheduled Meds: . apixaban  5 mg Oral BID  . aspirin  81 mg Oral Daily  . atorvastatin  40 mg Oral Daily  . Chlorhexidine Gluconate Cloth  6 each Topical Daily  . insulin aspart  0-9 Units Subcutaneous TID WC  . insulin glargine  30 Units Subcutaneous QHS  . pantoprazole  40 mg Oral Daily  . sodium chloride flush  3 mL Intravenous Once   Continuous Infusions:  PRN Meds: acetaminophen, albuterol, nitroGLYCERIN, ondansetron (ZOFRAN) IV   Vital Signs   Vitals:   07/14/19 0500 07/14/19 0600 07/14/19 0700 07/14/19 0804  BP: 99/72 92/68  103/68  Pulse: 92 78 (!) 111 80  Resp: 12 19 (!) 31 16  Temp:    97.6 F (36.4 C)  TempSrc:    Oral  SpO2: 98% 97% (!) 68% 100%  Weight:      Height:        Intake/Output Summary (Last 24 hours) at 07/14/2019 0806 Last data filed at 07/14/2019 0755 Gross per 24 hour  Intake 840 ml  Output 725 ml  Net 115 ml   Last 3 Weights 07/14/2019 07/13/2019 07/12/2019  Weight (lbs) 166 lb 14.2 oz 164 lb 14.5 oz 168 lb  Weight (kg) 75.7 kg 74.8 kg 76.204 kg      Telemetry  Overnight telemetry shows atrial fibrillation with heart rate in the 120-130, which I personally reviewed.   ECG  The most recent ECG shows atrial fibrillation with heart rate 114, LVH  noted, which I personally reviewed.   Physical Exam   Vitals:   07/14/19 0500 07/14/19 0600 07/14/19 0700 07/14/19 0804  BP: 99/72 92/68  103/68  Pulse: 92 78 (!) 111 80  Resp: 12 19 (!) 31 16  Temp:    97.6 F (36.4 C)  TempSrc:    Oral  SpO2: 98% 97% (!) 68% 100%  Weight:      Height:         Intake/Output Summary (Last 24 hours) at 07/14/2019 0806 Last data filed at 07/14/2019 0755 Gross per 24 hour  Intake 840 ml  Output 725 ml  Net 115 ml    Last 3 Weights 07/14/2019 07/13/2019 07/12/2019  Weight (lbs) 166 lb 14.2 oz 164 lb 14.5 oz 168 lb  Weight (kg) 75.7 kg 74.8 kg 76.204 kg    Body mass index is 22.63 kg/m.  General: Well nourished, well developed, in no acute distress Head: Atraumatic, normal size  Eyes: PEERLA, EOMI  Neck: Supple, JVD noted 12-15 cmH2O Endocrine: No thryomegaly Cardiac: Normal S1, S2; RRR; no murmurs, rubs, or gallops Lungs: Clear to auscultation bilaterally, no wheezing, rhonchi or rales  Abd: Soft, nontender, no hepatomegaly  Ext: No edema, pulses 2+ Musculoskeletal: No deformities, BUE  and BLE strength normal and equal Skin: Warm and dry, no rashes   Neuro: Alert and oriented to person, place, time, and situation, CNII-XII grossly intact, no focal deficits  Psych: Normal mood and affect   Labs  High Sensitivity Troponin:   Recent Labs  Lab 06/19/19 1511 06/19/19 1703 07/12/19 1305 07/12/19 1523  TROPONINIHS 40* 41* 150* 156*     Cardiac EnzymesNo results for input(s): TROPONINI in the last 168 hours. No results for input(s): TROPIPOC in the last 168 hours.  Chemistry Recent Labs  Lab 07/12/19 1305 07/13/19 0430 07/14/19 0142  NA 134* 134* 136  K 4.7 4.6 4.9  CL 103 103 104  CO2 15* 18* 20*  GLUCOSE 183* 157* 118*  BUN 48* 48* 53*  CREATININE 1.97* 1.88* 2.07*  CALCIUM 9.4 9.4 9.2  PROT  --   --  6.6  ALBUMIN  --   --  3.4*  AST  --   --  55*  ALT  --   --  58*  ALKPHOS  --   --  52  BILITOT  --   --  0.8  GFRNONAA 33*  35* 31*  GFRAA 38* 41* 36*  ANIONGAP 16* 13 12    Hematology Recent Labs  Lab 07/12/19 1305 07/13/19 0430 07/14/19 0142  WBC 8.2 9.1 8.5  RBC 4.24 4.21* 4.13*  HGB 12.9* 12.9* 12.6*  HCT 39.8 38.6* 37.7*  MCV 93.9 91.7 91.3  MCH 30.4 30.6 30.5  MCHC 32.4 33.4 33.4  RDW 14.2 14.2 14.4  PLT 210 216 219   BNP Recent Labs  Lab 07/12/19 1323 07/14/19 0142  BNP 1,976.9* 1,176.1*    DDimer  Recent Labs  Lab 07/12/19 1323  DDIMER 1.99*     Radiology  DG Chest 2 View  Result Date: 07/12/2019 CLINICAL DATA:  Shortness of breath. Additional history provided: Patient reports worsening shortness of breath for 3 weeks. EXAM: CHEST - 2 VIEW COMPARISON:  Chest radiograph 06/19/2019 FINDINGS: Redemonstrated left chest single lead AICD device. Prior median sternotomy. Unchanged mild cardiomegaly and central pulmonary vascular congestion. Aortic atherosclerosis. Subtle interstitial prominence within the mid to lower lung fields bilaterally is similar to prior examination 06/19/2019. No evidence of pleural effusion or pneumothorax. No acute bony abnormality. IMPRESSION: No significant interval change as compared to chest radiograph 06/19/2019. Mild cardiomegaly with central pulmonary vascular congestion. Subtle interstitial prominence within the mid to lower lung fields likely reflecting interstitial edema. Aortic atherosclerosis. Electronically Signed   By: Kellie Simmering DO   On: 07/12/2019 15:31   ECHOCARDIOGRAM COMPLETE  Result Date: 07/13/2019    ECHOCARDIOGRAM REPORT   Patient Name:   Jordan Ross Date of Exam: 07/13/2019 Medical Rec #:  [id number]       Height:       72.0 in Accession #:    [id number]      Weight:       164.9 lb Date of Birth:  04-Nov-1945       BSA:          1.963 m Patient Age:    74 years        BP:           73/44 mmHg Patient Gender: M               HR:           114 bpm. Exam Location:  Inpatient Procedure: 2D Echo and Intracardiac Opacification Agent Indications:     Atrial  Flutter 427.32 / I48.92  History:        Patient has prior history of Echocardiogram examinations, most                 recent 04/20/2012. CAD and Previous Myocardial Infarction, Prior                 CABG and Defibrillator, Arrythmias:Atrial Flutter; Risk                 Factors:Diabetes, Hypertension and Dyslipidemia. GERD. Ischemic                 cardiomyopathy.  Sonographer:    Darlina Sicilian RDCS Referring Phys: [id number] [place]  1. Left ventricular ejection fraction, by estimation, is <20%. The left ventricle has severely decreased function. The left ventricle demonstrates regional wall motion abnormalities (see scoring diagram/findings for description). The left ventricular internal cavity size was moderately dilated. There is mild left ventricular hypertrophy. Left ventricular diastolic parameters are indeterminate.  2. RV poorly visualized. Grossly appears enlarged with decreased systolic function. . Right ventricular systolic function was not well visualized. The right ventricular size is not well visualized. There is moderately elevated pulmonary artery systolic pressure.  3. Left atrial size was mildly dilated.  4. The MV vena contracta is 0.6 cm. . The mitral valve is abnormal. Moderate to severe mitral valve regurgitation. No evidence of mitral stenosis.  5. The aortic valve is tricuspid. Aortic valve regurgitation is not visualized. Low flow low gradient moderate aortic stenosis.  6. Aortic dilatation noted. There is mild dilatation of the aortic root and of the ascending aorta measuring 42 mm.  7. The inferior vena cava is dilated in size with <50% respiratory variability, suggesting right atrial pressure of 15 mmHg. FINDINGS  Left Ventricle: There is global hypokinesis with anteroseptal and apical akinesis. Left ventricular ejection fraction, by estimation, is <20%. The left ventricle has severely decreased function. The left ventricle demonstrates regional wall motion  abnormalities. Definity contrast agent was given IV to delineate the left ventricular endocardial borders. The left ventricular internal cavity size was moderately dilated. There is mild left ventricular hypertrophy. Left ventricular diastolic parameters  are indeterminate. Right Ventricle: RV poorly visualized. Grossly appears enlarged with decreased systolic function. The right ventricular size is not well visualized. Right vetricular wall thickness was not assessed. Right ventricular systolic function was not well visualized. There is moderately elevated pulmonary artery systolic pressure. The tricuspid regurgitant velocity is 2.74 m/s, and with an assumed right atrial pressure of 15 mmHg, the estimated right ventricular systolic pressure is 0000000 mmHg. Left Atrium: Left atrial size was mildly dilated. Right Atrium: Right atrial size was not well visualized. Pericardium: There is no evidence of pericardial effusion. Mitral Valve: The MV vena contracta is 0.6 cm. The mitral valve is abnormal. There is mild thickening of the mitral valve leaflet(s). There is mild calcification of the mitral valve leaflet(s). Mild mitral annular calcification. Moderate to severe mitral  valve regurgitation. No evidence of mitral valve stenosis. Tricuspid Valve: The tricuspid valve is normal in structure. Tricuspid valve regurgitation is mild . No evidence of tricuspid stenosis. Aortic Valve: The aortic valve is tricuspid. . There is severe thickening and severe calcifcation of the aortic valve. Aortic valve regurgitation is not visualized. Low flow low gradient moderate aortic stenosis. Severe aortic valve annular calcification. There is severe thickening of the aortic valve. There is severe calcifcation of the aortic valve. Aortic valve mean gradient measures 4.5 mmHg. Aortic  valve peak gradient measures 7.5 mmHg. Aortic valve area, by VTI measures 1.24 cm. Pulmonic Valve: The pulmonic valve was not well visualized. Pulmonic valve  regurgitation is not visualized. No evidence of pulmonic stenosis. Aorta: Aortic dilatation noted. There is mild dilatation of the aortic root and of the ascending aorta measuring 42 mm. Pulmonary Artery: Indeterminant PASP, inadequate TR jet. Venous: The inferior vena cava is dilated in size with less than 50% respiratory variability, suggesting right atrial pressure of 15 mmHg. IAS/Shunts: No atrial level shunt detected by color flow Doppler.  LEFT VENTRICLE PLAX 2D LVIDd:         6.50 cm LVIDs:         5.50 cm LV PW:         1.10 cm LV IVS:        1.10 cm LVOT diam:     2.20 cm LV SV:         20 LV SV Index:   10 LVOT Area:     3.80 cm  LV Volumes (MOD) LV vol d, MOD A2C: 256.0 ml LV vol d, MOD A4C: 284.0 ml LV vol s, MOD A2C: 199.0 ml LV vol s, MOD A4C: 227.0 ml LV SV MOD A2C:     57.0 ml LV SV MOD A4C:     284.0 ml LV SV MOD BP:      62.9 ml LEFT ATRIUM             Index LA diam:        5.30 cm 2.70 cm/m LA Vol (A2C):   81.5 ml 41.52 ml/m LA Vol (A4C):   58.3 ml 29.70 ml/m LA Biplane Vol: 70.5 ml 35.92 ml/m  AORTIC VALVE AV Area (Vmax):    1.13 cm AV Area (Vmean):   1.13 cm AV Area (VTI):     1.24 cm AV Vmax:           137.00 cm/s AV Vmean:          98.300 cm/s AV VTI:            0.158 m AV Peak Grad:      7.5 mmHg AV Mean Grad:      4.5 mmHg LVOT Vmax:         40.90 cm/s LVOT Vmean:        29.200 cm/s LVOT VTI:          0.052 m LVOT/AV VTI ratio: 0.33  AORTA Ao Root diam: 4.40 cm Ao Asc diam:  4.20 cm MR Peak grad:    30.0 mmHg   TRICUSPID VALVE MR Mean grad:    17.0 mmHg   TR Peak grad:   30.0 mmHg MR Vmax:         274.00 cm/s TR Vmax:        274.00 cm/s MR Vmean:        192.0 cm/s MR PISA:         2.26 cm    SHUNTS MR PISA Eff ROA: 32 mm      Systemic VTI:  0.05 m MR PISA Radius:  0.60 cm     Systemic Diam: 2.20 cm Carlyle Dolly MD Electronically signed by Carlyle Dolly MD Signature Date/Time: 07/13/2019/1:56:25 PM    Final     Cardiac Studies  TTE 07/13/2019  1. Left ventricular ejection  fraction, by estimation, is <20%. The left  ventricle has severely decreased function. The left ventricle demonstrates  regional wall motion abnormalities (see scoring  diagram/findings for  description). The left ventricular  internal cavity size was moderately dilated. There is mild left  ventricular hypertrophy. Left ventricular diastolic parameters are  indeterminate.  2. RV poorly visualized. Grossly appears enlarged with decreased systolic  function. . Right ventricular systolic function was not well visualized.  The right ventricular size is not well visualized. There is moderately  elevated pulmonary artery systolic  pressure.  3. Left atrial size was mildly dilated.  4. The MV vena contracta is 0.6 cm. . The mitral valve is abnormal.  Moderate to severe mitral valve regurgitation. No evidence of mitral  stenosis.  5. The aortic valve is tricuspid. Aortic valve regurgitation is not  visualized. Low flow low gradient moderate aortic stenosis.  6. Aortic dilatation noted. There is mild dilatation of the aortic root  and of the ascending aorta measuring 42 mm.  7. The inferior vena cava is dilated in size with <50% respiratory  variability, suggesting right atrial pressure of 15 mmHg.   Patient Profile  Jordan Ross is a 74 y.o. male with CAD status post CABG (2001, LIMA-LAD, vein graft to ramus, vein graft to circumflex, vein graft to PDA), ischemic cardiomyopathy ejection fraction now 10 to 15% status post AICD, AAA status post EVAR who was admitted on 07/12/2019 for acute decompensated systolic heart failure and atrial fibrillation with RVR.  On 07/13/2019 he was transferred to the ICU due to persistent hypotension despite heart rates not that high.  Assessment & Plan   1.  Hypotension, concerns for cardiogenic shock -EF is now 10-15%.  LVOT VTI was estimated at 5.2 cm.  Blood pressure is persistently in the mid [id number] systolically during my examination.  He is thinking  clearly and not as short of breath.  When he gets up and tries to do anything he is profoundly short of breath.  His A. fib/flutter shows rates in the 120 range.  This is definitely out of proportion to what I would expect for him. -On 07/13/2019 he had decompensation with persistent hypotension. -Given his low LVOT VTI I have concerns for low output state and cardiogenic shock.  I will stop his Eliquis.  We will put him on a heparin drip for now.  I have put in for a PICC placement as well as an A-line.  We will get a better idea of what his Cholox is as well as blood pressure on the A-line.  I suspect he is going to need inotropes. -Kidney function also climbing. -We will hold his digoxin.  We will hold beta-blockers.  I will hold amiodarone as he needs a tachycardia for now. -We will check lactic acid and cycle them. -We will hold afterload reduction for now.  He is somewhat stable but I suspect he is in cardiogenic shock.  2.  New onset atrial fibrillation/flutter -Hypotensive with amiodarone.  We will hold this for now.  He needs the tachycardia. -We will transition heparin drip in case he needs further procedures.  Plans for PICC and A-line today. -We will likely need TEE cardioversion but we need to investigate his hypotension first.  3.  CAD status post CABG -Given drop in EF and now concerns for low output state/cardiogenic shock, he will need to have reevaluation of his coronary anatomy.  Now not the time given his AKI.  We need to stabilize his hemodynamics first. -Continue aspirin statin.  4.  Diabetes -sliding scale insulin house -30 units Lantus at night  5.  Acute kidney injury -  Likely combination of diuresis, hypotension due to cardiogenic shock.  Management as above.  FEN -no IVF -diet: salt restricted -dvt ppx: heparin -code: full   CRITICAL CARE Performed by: [place] T O'Neal  Total critical care time: 45 minutes. Critical care time was exclusive of separately  billable procedures and treating other patients. Critical care was necessary to treat or prevent imminent or life-threatening deterioration. Critical care was time spent personally by me on the following activities: development of treatment plan with patient and/or surrogate as well as nursing, discussions with consultants, evaluation of patient's response to treatment, examination of patient, obtaining history from patient or surrogate, ordering and performing treatments and interventions, ordering and review of laboratory studies, ordering and review of radiographic studies, pulse oximetry and re-evaluation of patient's condition.  Signed, Addison Naegeli. Audie Box, [place]  07/14/2019 8:20 AM

## 2019-07-14 NOTE — Progress Notes (Signed)
eLink Physician-Brief Progress Note Patient Name: SHRIYAAN MCAREE DOB: 1945-04-21 MRN: [id number]   Date of Service  07/14/2019  HPI/Events of Note  BP 87/67 MAP 74, per daytime attending notes he's comfortable with current BP within the context of patient's advanced cardiomyopathy with low EF.  eICU Interventions  No indication for pressors at this time.        Kerry Kass Spiros Greenfeld 07/14/2019, 2:28 AM

## 2019-07-14 NOTE — Plan of Care (Signed)
  Problem: Clinical Measurements: Goal: Ability to maintain clinical measurements within normal limits will improve Outcome: Progressing Goal: Respiratory complications will improve Outcome: Progressing   Problem: Activity: Goal: Risk for activity intolerance will decrease Outcome: Progressing   Problem: Nutrition: Goal: Adequate nutrition will be maintained Outcome: Progressing   Problem: Coping: Goal: Level of anxiety will decrease Outcome: Progressing   Problem: Pain Managment: Goal: General experience of comfort will improve Outcome: Progressing   Problem: Safety: Goal: Ability to remain free from injury will improve Outcome: Progressing   Problem: Skin Integrity: Goal: Risk for impaired skin integrity will decrease Outcome: Progressing

## 2019-07-14 NOTE — Progress Notes (Signed)
ANTICOAGULATION CONSULT NOTE - Initial Consult  Pharmacy Consult for apixaban>>heparin Indication: atrial fibrillation  No Known Allergies  Patient Measurements: Height: 6' (182.9 cm) Weight: 166 lb 14.2 oz (75.7 kg) IBW/kg (Calculated) : 77.6 Heparin Dosing Weight: 76  Vital Signs: Temp: 97.6 F (36.4 C) (03/28 0804) Temp Source: Oral (03/28 0804) BP: 103/68 (03/28 0804) Pulse Rate: 80 (03/28 0804)  Labs: Recent Labs    07/12/19 1305 07/12/19 1305 07/12/19 1523 07/13/19 0430 07/14/19 0142  HGB 12.9*   < >  --  12.9* 12.6*  HCT 39.8  --   --  38.6* 37.7*  PLT 210  --   --  216 219  CREATININE 1.97*  --   --  1.88* 2.07*  TROPONINIHS 150*  --  156*  --   --    < > = values in this interval not displayed.    Estimated Creatinine Clearance: 35 mL/min (A) (by C-G formula based on SCr of 2.07 mg/dL (H)).   Medical History: Past Medical History:  Diagnosis Date  . Abdominal aortic aneurysm ([place])   . AICD (automatic cardioverter/defibrillator) present    [employer]  . Anemia    iron deficiency anemia,takes iron pill daily  . Arthritis   . Coronary artery disease    AWMI 2001  . Diverticulosis   . GERD (gastroesophageal reflux disease)    takes Protonix daily  . History of colon polyps    benign  . History of migraine 40 yrs ago  . History of shingles   . Hyperlipidemia    takes Atorvastatin and Niacin daily  . Hypertension    takes Diovan,Spironolactone, and Carvedilol daily  . ICD (implantable cardioverter-defibrillator), single, in situ   . Ischemic cardiomyopathy   . Joint swelling   . LV dysfunction    takes Digoxin daily  . Myocardial infarction ([place]) 2001  . Pneumonia 40 yrs ago   hx of  . Shortness of breath dyspnea    with exertion.States he was a smoker for yr  . Status post coronary artery bypass grafting   . Type 2 diabetes mellitus ([facility])    takes Glipizide,Metformin,Invokana,and Lantus daily.Average fasting blood sugar runs about 140     Assessment: 73 year old male with new onset afib recently started on apixaban 3/26, received 3 doses with last on 3/27pm.  Concern for low output and cardiogenic shock, apixaban has been switched to IV heparin in case procedures are needed. Will utilize aptt's to monitor and dose adjust for the next couple of days until heparin levels and aptt correlate.    Goal of Therapy:  Aptt goal 66-102s Heparin level 0.3-0.7 units/ml Monitor platelets by anticoagulation protocol: Yes   Plan:  Start heparin infusion at 1100 units/hr Will check aptt tonight then aptt and heparin level daily Continue to monitor H&H and platelets  Erin Hearing PharmD., BCPS Clinical Pharmacist 07/14/2019 9:00 AM

## 2019-07-14 NOTE — Progress Notes (Signed)
NAME:  Jordan Ross, MRN:  [id number], DOB:  1945-12-12, LOS: 2 ADMISSION DATE:  07/12/2019, CONSULTATION DATE: 07/13/2019 REFERRING MD: [facility], CHIEF COMPLAINT: Hypotension  HPI/course in hospital  74 year old man with history of ischemic cardiomyopathy and prior CABG. Presented yesterday with increasing shortness of breath and was found to be in congestive heart failure, atrial fibrillation with rapid ventricular response.  EF now less than 20%.  Diuresed, started on IV amiodarone for rate control.  Plan for DC cardioversion back to sinus rhythm.  Hypotensive with systolic in the [id number] on the floor and transfer to ICU.  Past Medical History   Past Medical History:  Diagnosis Date  . Abdominal aortic aneurysm ([place])   . AICD (automatic cardioverter/defibrillator) present    St Jude  . Anemia    iron deficiency anemia,takes iron pill daily  . Arthritis   . Coronary artery disease    AWMI 2001  . Diverticulosis   . GERD (gastroesophageal reflux disease)    takes Protonix daily  . History of colon polyps    benign  . History of migraine 40 yrs ago  . History of shingles   . Hyperlipidemia    takes Atorvastatin and Niacin daily  . Hypertension    takes Diovan,Spironolactone, and Carvedilol daily  . ICD (implantable cardioverter-defibrillator), single, in situ   . Ischemic cardiomyopathy   . Joint swelling   . LV dysfunction    takes Digoxin daily  . Myocardial infarction ([place]) 2001  . Pneumonia 40 yrs ago   hx of  . Shortness of breath dyspnea    with exertion.States he was a smoker for yr  . Status post coronary artery bypass grafting   . Type 2 diabetes mellitus (HCC)    takes Glipizide,Metformin,Invokana,and Lantus daily.Average fasting blood sugar runs about 140     Past Surgical History:  Procedure Laterality Date  . ABDOMINAL AORTIC ENDOVASCULAR STENT GRAFT N/A 05/22/2015   Procedure: ABDOMINAL AORTIC ENDOVASCULAR STENT GRAFT;  Surgeon: Serafina Mitchell, MD;  Location: [facility];  Service: Vascular;  Laterality: N/A;  . CARDIAC CATHETERIZATION  2001  . COLONOSCOPY N/A 03/21/2014   Procedure: COLONOSCOPY;  Surgeon: Rogene Houston, MD;  [facility];  Service: Endoscopy;  Laterality: N/A;  855 - moved to 12/4 @ 8:30 - Ann notified pt  . CORONARY ARTERY BYPASS GRAFT  2001   unsure if it was 3 or 4  . DOPPLER ECHOCARDIOGRAPHY     2011 38% EF.2013 EF 30-35%     Review of Systems:   Review of Systems  Gastrointestinal: Positive for constipation.  All other systems reviewed and are negative.   Social History   reports that he has quit smoking. He quit after 40.00 years of use. He has never used smokeless tobacco. He reports that he does not drink alcohol or use drugs.   Family History   His family history includes COPD in his mother; Heart disease in his brother.   Allergies No Known Allergies   Home Medications  Prior to Admission medications   Medication Sig Start Date End Date Taking? Authorizing Provider  acetaminophen (TYLENOL) 325 MG tablet Take 975 mg by mouth daily as needed (back pain).   Yes [provider]  albuterol (VENTOLIN HFA) 108 (90 Base) MCG/ACT inhaler Inhale 1-2 puffs into the lungs every 4 (four) hours as needed for wheezing or shortness of breath.  06/07/19  Yes [provider]  aspirin EC 81 MG tablet  Take 81 mg by mouth at bedtime.   Yes [provider]  atorvastatin (LIPITOR) 80 MG tablet Take 80 mg by mouth daily.   Yes [provider]  ferrous sulfate 325 (65 FE) MG tablet Take 325 mg by mouth daily with breakfast.   Yes [provider]  furosemide (LASIX) 20 MG tablet Take 20 mg by mouth daily. 06/17/19  Yes [provider]  glipiZIDE (GLUCOTROL) 10 MG tablet Take 10 mg by mouth 2 (two) times daily before a meal.    Yes [provider]  insulin glargine (LANTUS SOLOSTAR) 100 UNIT/ML Solostar Pen Inject 30 Units into the skin at  bedtime.   Yes [provider]  meloxicam (MOBIC) 15 MG tablet Take 15 mg by mouth daily.   Yes [provider]  metFORMIN (GLUCOPHAGE) 500 MG tablet Take 1,000 mg by mouth 2 (two) times daily with a meal.  03/25/13  Yes [provider]  metolazone (ZAROXOLYN) 2.5 MG tablet Take 2.5 mg by mouth daily. 07/11/19  Yes [provider]  Multiple Vitamin (MULTIVITAMIN WITH MINERALS) TABS tablet Take 1 tablet by mouth daily.   Yes [provider]  pantoprazole (PROTONIX) 40 MG tablet Take 40 mg by mouth daily. 03/25/13  Yes [provider]  sacubitril-valsartan (ENTRESTO) 49-51 MG Take 1 tablet by mouth 2 (two) times daily.   Yes [provider]  FORA LANCETS MISC Test bid as directed. E11.65. Patient taking differently: 1 each by Other route in the morning and at bedtime.  04/08/16   Cassandria Anger, MD  glucose blood (FORA V10 BLOOD GLUCOSE TEST) test strip Use as instructed bid. E11.65 Patient taking differently: 1 each by Other route in the morning and at bedtime.  05/25/17   Cassandria Anger, MD  glucose blood test strip Use as instructed Patient taking differently: 1 each by Other route See admin instructions. Use as instructed 06/25/15   Cassandria Anger, MD  Insulin Glargine (TOUJEO SOLOSTAR) 300 UNIT/ML SOPN Inject 70 Units into the skin at bedtime. Patient not taking: Reported on 07/12/2019 05/25/17   Cassandria Anger, MD  LANCETS ULTRA THIN MISC Test BG every morning Patient taking differently: 1 each by Other route in the morning.  02/26/16   Cassandria Anger, MD  LITETOUCH PEN NEEDLES 31G X 8 MM MISC USE AT BEDTIME AS DIRECTED. Patient taking differently: 1 each by Other route at bedtime.  12/14/15   Cassandria Anger, MD     Interim history/subjective:  Patient has remained relatively asymptomatic.  Increased hypotension this morning prompted Dr. Davina Poke to place PICC line and check SCV O2 which was low at  37.  Started on milrinone. Patient feels somewhat warmer since milrinone initiation.  Objective   Blood pressure 93/69, pulse 68, temperature 98.2 F (36.8 C), temperature source Oral, resp. rate 18, height 6' (1.829 m), weight 75.7 kg, SpO2 99 %. CVP:  [12 mmHg-22 mmHg] 19 mmHg      Intake/Output Summary (Last 24 hours) at 07/14/2019 1700 Last data filed at 07/14/2019 1600 Gross per 24 hour  Intake 880.76 ml  Output 1175 ml  Net -294.24 ml   Filed Weights   07/12/19 1246 07/13/19 0450 07/14/19 0404  Weight: 76.2 kg 74.8 kg 75.7 kg    Examination: Physical Exam Constitutional:      General: He is not in acute distress.    Appearance: He is well-developed. He is not ill-appearing or diaphoretic.     Comments: Slender  build  HENT:     Head: Normocephalic and atraumatic.     Mouth/Throat:     Mouth: Mucous membranes are moist.  Eyes:     Extraocular Movements: Extraocular movements intact.  Cardiovascular:     Rate and Rhythm: Tachycardia present. Rhythm irregular.     Pulses: Normal pulses.     Heart sounds: S1 normal and S2 normal. Heart sounds are distant. Gallop present.      Comments: JVP is at 3 cm above sternal angle positive HJR Pulmonary:     Effort: Pulmonary effort is normal. No tachypnea or respiratory distress.     Breath sounds: Normal breath sounds. No rales.  Musculoskeletal:     Right lower leg: No edema.     Left lower leg: No edema.  Skin:    General: Skin is warm.     Capillary Refill: Capillary refill takes 2 to 3 seconds.  Neurological:     General: No focal deficit present.     Mental Status: He is alert and oriented to person, place, and time.      Ancillary tests (personally reviewed)  CBC: Recent Labs  Lab 07/12/19 1305 07/13/19 0430 07/14/19 0142  WBC 8.2 9.1 8.5  HGB 12.9* 12.9* 12.6*  HCT 39.8 38.6* 37.7*  MCV 93.9 91.7 91.3  PLT 210 216 [id number]    Basic Metabolic Panel: Recent Labs  Lab 07/12/19 1305 07/13/19 0430  07/14/19 0142  NA 134* 134* 136  K 4.7 4.6 4.9  CL 103 103 104  CO2 15* 18* 20*  GLUCOSE 183* 157* 118*  BUN 48* 48* 53*  CREATININE 1.97* 1.88* 2.07*  CALCIUM 9.4 9.4 9.2   GFR: Estimated Creatinine Clearance: 35 mL/min (A) (by C-G formula based on SCr of 2.07 mg/dL (H)). Recent Labs  Lab 07/12/19 1305 07/13/19 0430 07/14/19 0142 07/14/19 0743 07/14/19 1005  WBC 8.2 9.1 8.5  --   --   LATICACIDVEN  --   --   --  1.3 1.3    Liver Function Tests: Recent Labs  Lab 07/14/19 0142  AST 55*  ALT 58*  ALKPHOS 52  BILITOT 0.8  PROT 6.6  ALBUMIN 3.4*   No results for input(s): LIPASE, AMYLASE in the last 168 hours. No results for input(s): AMMONIA in the last 168 hours.  ABG    Component Value Date/Time   PHART 7.384 05/18/2015 1051   PCO2ART 37.8 05/18/2015 1051   PO2ART 106 (H) 05/18/2015 1051   HCO3 22.1 05/18/2015 1051   TCO2 23.2 05/18/2015 1051   ACIDBASEDEF 2.2 (H) 05/18/2015 1051   O2SAT 37.4 07/14/2019 1353     Coagulation Profile: No results for input(s): INR, PROTIME in the last 168 hours.  Cardiac Enzymes: No results for input(s): CKTOTAL, CKMB, CKMBINDEX, TROPONINI in the last 168 hours.  HbA1C: Hemoglobin A1C  Date/Time Value Ref Range Status  11/03/2015 12:00 AM 7  Final   Hgb A1c MFr Bld  Date/Time Value Ref Range Status  05/19/2017 10:34 AM 8.6 (H) <5.7 % of total Hgb Final    Comment:    For someone without known diabetes, a hemoglobin A1c value of 6.5% or greater indicates that they may have  diabetes and this should be confirmed with a follow-up  test. . For someone with known diabetes, a value <7% indicates  that their diabetes is well controlled and a value  greater than or equal to 7% indicates suboptimal  control. A1c targets should be individualized based on  duration  of diabetes, age, comorbid conditions, and  other considerations. . Currently, no consensus exists regarding use of hemoglobin A1c for diagnosis of diabetes  for children. .   01/30/2017 09:40 AM 8.2 (H) <5.7 % of total Hgb Final    Comment:    For someone without known diabetes, a hemoglobin A1c value of 6.5% or greater indicates that they may have  diabetes and this should be confirmed with a follow-up  test. . For someone with known diabetes, a value <7% indicates  that their diabetes is well controlled and a value  greater than or equal to 7% indicates suboptimal  control. A1c targets should be individualized based on  duration of diabetes, age, comorbid conditions, and  other considerations. . Currently, no consensus exists regarding use of hemoglobin A1c for diagnosis of diabetes for children. .     CBG: Recent Labs  Lab 07/13/19 1120 07/13/19 1637 07/14/19 0643 07/14/19 1137 07/14/19 1607  GLUCAP 153* 131* 82 112* 161*     Assessment & Plan:   Critically ill due to acute decompensated systolic heart failure with subclinical cardiogenic shock requiring titration of inotropic agents. Still does not appear volume overloaded -Continue milrinone at current dose. -Follow SCV O2 -Heart failure service to evaluate regarding further management options.  Critically ill due to atrial fibrillation with rapid ventricular response requiring titration of amiodarone for rate control. -Continue milrinone infusion. -Heparin for stroke prevention. -We will consider DC cardioversion later this week.  Daily Goals Checklist  Pain/Anxiety/Delirium protocol (if indicated): As needed VAP protocol (if indicated): Not intubated Respiratory support goals: Currently on room air Blood pressure target: Keep MAP greater than 65 DVT prophylaxis: On apixaban Nutritional status and feeding goals: Full cardiac diet GI prophylaxis: On pantoprazole Fluid status goals: Clinically euvolemic.  No diuresis for today Urinary catheter: Voiding spontaneously Central lines: Peripheral IVs only Glucose control: Euglycemic on insulin sliding  scale Mobility/therapy needs: Up ad lib. Antibiotic de-escalation: No antibiotics Home medication reconciliation: Per cardiology Daily labs: BMP Code Status: Full Family Communication: Patient and wife updated in person Disposition: ICU  Kipp Brood, MD [facility] ICU Physician [place]  Pager: [phone] Mobile: [phone] After hours: [phone].  07/14/2019, 5:00 PM

## 2019-07-15 ENCOUNTER — Encounter (HOSPITAL_COMMUNITY)
Admission: EM | Disposition: A | Discharge status: Home Health | Payer: Self-pay | Source: Ambulatory Visit | Attending: Internal Medicine

## 2019-07-15 DIAGNOSIS — I5021 Acute systolic (congestive) heart failure: Secondary | ICD-10-CM

## 2019-07-15 LAB — BASIC METABOLIC PANEL
Anion gap: 13 (ref 5–15)
BUN: 49 mg/dL — ABNORMAL HIGH (ref 8–23)
CO2: 17 mmol/L — ABNORMAL LOW (ref 22–32)
Calcium: 8.9 mg/dL (ref 8.9–10.3)
Chloride: 106 mmol/L (ref 98–111)
Creatinine, Ser: 1.8 mg/dL — ABNORMAL HIGH (ref 0.61–1.24)
GFR calc Af Amer: 43 mL/min — ABNORMAL LOW (ref >60)
GFR calc non Af Amer: 37 mL/min — ABNORMAL LOW (ref >60)
Glucose, Bld: 153 mg/dL — ABNORMAL HIGH (ref 70–99)
Potassium: 4.5 mmol/L (ref 3.5–5.1)
Sodium: 136 mmol/L (ref 135–145)

## 2019-07-15 LAB — HEPARIN LEVEL (UNFRACTIONATED)
Heparin Unfractionated: 1.66 IU/mL — ABNORMAL HIGH (ref 0.30–0.70)
Heparin Unfractionated: 1.24 IU/mL — ABNORMAL HIGH (ref 0.30–0.70)

## 2019-07-15 LAB — COOXEMETRY PANEL
Carboxyhemoglobin: 0.8 % (ref 0.5–1.5)
Carboxyhemoglobin: 0.9 % (ref 0.5–1.5)
Methemoglobin: 1.3 % (ref 0.0–1.5)
Methemoglobin: 1.3 % (ref 0.0–1.5)
O2 Saturation: 50.2 %
O2 Saturation: 52.9 %
Total hemoglobin: 12 g/dL (ref 12.0–16.0)
Total hemoglobin: 12 g/dL (ref 12.0–16.0)

## 2019-07-15 LAB — CBC
HCT: 34.7 % — ABNORMAL LOW (ref 39.0–52.0)
Hemoglobin: 11.6 g/dL — ABNORMAL LOW (ref 13.0–17.0)
MCH: 30.4 pg (ref 26.0–34.0)
MCHC: 33.4 g/dL (ref 30.0–36.0)
MCV: 91.1 fL (ref 80.0–100.0)
Platelets: 236 10*3/uL (ref 150–400)
RBC: 3.81 MIL/uL — ABNORMAL LOW (ref 4.22–5.81)
RDW: 14.3 % (ref 11.5–15.5)
WBC: 7.9 10*3/uL (ref 4.0–10.5)
nRBC: 0 % (ref 0.0–0.2)

## 2019-07-15 LAB — GLUCOSE, CAPILLARY
Glucose-Capillary: 111 mg/dL — ABNORMAL HIGH (ref 70–99)
Glucose-Capillary: 141 mg/dL — ABNORMAL HIGH (ref 70–99)
Glucose-Capillary: 108 mg/dL — ABNORMAL HIGH (ref 70–99)
Glucose-Capillary: 180 mg/dL — ABNORMAL HIGH (ref 70–99)

## 2019-07-15 LAB — APTT
aPTT: 141 seconds — ABNORMAL HIGH (ref 24–36)
aPTT: 95 seconds — ABNORMAL HIGH (ref 24–36)

## 2019-07-15 SURGERY — ECHOCARDIOGRAM, TRANSESOPHAGEAL
Anesthesia: Monitor Anesthesia Care

## 2019-07-15 MED ORDER — SORBITOL 70 % SOLN
30.0000 mL | Freq: Every day | Status: DC | PRN
  Administered 2019-07-15: 30 mL via ORAL
  Filled 2019-07-15: qty 30

## 2019-07-15 MED ORDER — HEPARIN (PORCINE) 25000 UT/250ML-% IV SOLN
850.0000 [IU]/h | INTRAVENOUS | Status: AC
  Administered 2019-07-15 – 2019-07-17 (×3): 850 [IU]/h via INTRAVENOUS
  Filled 2019-07-15 (×2): qty 250

## 2019-07-15 NOTE — Progress Notes (Signed)
Advanced Heart Failure Rounding Note   Subjective:    Now on milrinone. Feeling much better. Co-ox marginal 53%.   Remains in AF rates  90-110. CVP 7. Creatinine 2.1 -> 1.8  Denies CP, SOB or palpitations.    Objective:   Weight Range:  Vital Signs:   Temp:  [97.6 F (36.4 C)-97.8 F (36.6 C)] 97.6 F (36.4 C) (03/29 0802) Pulse Rate:  [67-125] 94 (03/29 1500) Resp:  [0-33] 20 (03/29 1500) BP: (83-124)/(55-97) 88/64 (03/29 1400) SpO2:  [85 %-100 %] 100 % (03/29 1500) Arterial Line BP: (79-141)/(35-83) 120/68 (03/29 1500) Weight:  [76 kg] 76 kg (03/29 0405) Last BM Date: 07/10/19  Weight change: Filed Weights   07/13/19 0450 07/14/19 0404 07/15/19 0405  Weight: 74.8 kg 75.7 kg 76 kg    Intake/Output:   Intake/Output Summary (Last 24 hours) at 07/15/2019 1603 Last data filed at 07/15/2019 1500 Gross per 24 hour  Intake 1475.03 ml  Output 950 ml  Net 525.03 ml     Physical Exam: General:  Ambulating room on my arrival. No resp difficulty HEENT: normal Neck: supple. JVP 6-7 . Carotids 2+ bilat; no bruits. No lymphadenopathy or thryomegaly appreciated. Cor: PMI nondisplaced. Irregular rate & rhythm. No rubs, gallops or murmurs. Lungs: clear Abdomen: soft, nontender, nondistended. No hepatosplenomegaly. No bruits or masses. Good bowel sounds. Extremities: no cyanosis, clubbing, rash, edema Neuro: alert & orientedx3, cranial nerves grossly intact. moves all 4 extremities w/o difficulty. Affect pleasant  Telemetry: AF 90-110 Personally reviewed  Labs: Basic Metabolic Panel: Recent Labs  Lab 07/12/19 1305 07/12/19 1305 07/13/19 0430 07/14/19 0142 07/15/19 0444  NA 134*  --  134* 136 136  K 4.7  --  4.6 4.9 4.5  CL 103  --  103 104 106  CO2 15*  --  18* 20* 17*  GLUCOSE 183*  --  157* 118* 153*  BUN 48*  --  48* 53* 49*  CREATININE 1.97*  --  1.88* 2.07* 1.80*  CALCIUM 9.4   < > 9.4 9.2 8.9   < > = values in this interval not displayed.    Liver  Function Tests: Recent Labs  Lab 07/14/19 0142  AST 55*  ALT 58*  ALKPHOS 52  BILITOT 0.8  PROT 6.6  ALBUMIN 3.4*   No results for input(s): LIPASE, AMYLASE in the last 168 hours. No results for input(s): AMMONIA in the last 168 hours.  CBC: Recent Labs  Lab 07/12/19 1305 07/13/19 0430 07/14/19 0142 07/15/19 0444  WBC 8.2 9.1 8.5 7.9  HGB 12.9* 12.9* 12.6* 11.6*  HCT 39.8 38.6* 37.7* 34.7*  MCV 93.9 91.7 91.3 91.1  PLT 210 216 219 236    Cardiac Enzymes: No results for input(s): CKTOTAL, CKMB, CKMBINDEX, TROPONINI in the last 168 hours.  BNP: BNP (last 3 results) Recent Labs    06/19/19 1511 07/12/19 1323 07/14/19 0142  BNP 1,000.0* 1,976.9* 1,176.1*    ProBNP (last 3 results) No results for input(s): PROBNP in the last 8760 hours.    Other results:  Imaging: DG CHEST PORT 1 VIEW  Result Date: 07/14/2019 CLINICAL DATA:  PICC placement EXAM: PORTABLE CHEST 1 VIEW COMPARISON:  Chest radiograph from earlier today. FINDINGS: Stable configuration of single lead left subclavian ICD and intact sternotomy wires with CABG clips over the mediastinum. Right PICC terminates over the cavoatrial junction. Stable cardiomediastinal silhouette with mild cardiomegaly. No pneumothorax. No pleural effusion. Cephalization of the pulmonary vasculature without overt pulmonary edema. No acute  consolidative airspace disease. IMPRESSION: 1. Right PICC terminates over the cavoatrial junction. 2. Stable mild cardiomegaly without overt pulmonary edema. Electronically Signed   By: Ilona Sorrel M.D.   On: 07/14/2019 13:52   DG CHEST PORT 1 VIEW  Result Date: 07/14/2019 CLINICAL DATA:  Congestive heart EXAM: PORTABLE CHEST 1 VIEW COMPARISON:  07/12/2019 FINDINGS: Bilateral diffuse interstitial thickening. No pleural effusion or pneumothorax. Pleural plaque along the left diaphragm. Stable cardiomegaly. Prior CABG. Single lead cardiac pacemaker. No acute osseous abnormality. IMPRESSION:  Findings concerning for mild CHF. Electronically Signed   By: Kathreen Devoid   On: 07/14/2019 09:17   [place] EKG SITE RITE  Result Date: 07/14/2019 If Site Rite image not attached, placement could not be confirmed due to current cardiac rhythm.     Medications:     Scheduled Medications: . aspirin  81 mg Oral Daily  . atorvastatin  40 mg Oral Daily  . Chlorhexidine Gluconate Cloth  6 each Topical Daily  . docusate sodium  100 mg Oral Daily  . insulin aspart  0-9 Units Subcutaneous TID WC  . insulin glargine  30 Units Subcutaneous QHS  . pantoprazole  40 mg Oral Daily  . polyethylene glycol  17 g Oral Daily  . sodium chloride flush  10-40 mL Intracatheter Q12H  . sodium chloride flush  3 mL Intravenous Once     Infusions: . sodium chloride    . amiodarone 30 mg/hr (07/15/19 1500)  . heparin 850 Units/hr (07/15/19 1500)  . milrinone 0.25 mcg/kg/min (07/15/19 1500)     PRN Medications:  Place/Maintain arterial line **AND** sodium chloride, acetaminophen, albuterol, nitroGLYCERIN, ondansetron (ZOFRAN) IV, sodium chloride flush, sorbitol   Assessment/Plan:   1. Acute on chronic systolic HF -> cardiogenic shock - due to mixed iCM/NICM - baseline EF 30-35% due to iCM. EF this admit 10-15% due to tachy-induced CM - Initial co-ox 37% -> milrinone started 3/28. Co-ox now 53% - volume status improved. Can hold IV lasix. Start po soon - no digoxin yet with AKI. Maybe in am  - agree with TEE/DC-CV this admit. Possibly Wednesday - no b-blocker with low output - No ARNI/ARB with AKI  - worsening CM likely due to AF (tachy-mediated CM) but cannot exclude worsening CAD. May need cath at some point  2. Afib/flutler, Persistent - with RVR. - likely source of CM - rate slower on IV amio  - continue heparin  - TEE/DC-CV when more stable. Likely Wednesday   3. AKI - baseline creatinine ~1.2 - now 2.1. likely due to shock/cardiorenal syndrome  4. CAD s/p CABG - s/p anterior MI  in 2001 followed by CABG - no recent cath but denies angina - medical therapy for now in setting of AKI     Length of Stay: 3   Glori Bickers MD 07/15/2019, 4:03 PM  Advanced Heart Failure Team Pager [phone] (M-F; [place])  Please contact [facility] for night-coverage after hours (4p -7a ) and weekends on [email or web address]

## 2019-07-15 NOTE — Progress Notes (Signed)
[place] for apixaban>>heparin Indication: atrial fibrillation  No Known Allergies  Patient Measurements: Height: 6' (182.9 cm) Weight: 167 lb 8.8 oz (76 kg) IBW/kg (Calculated) : 77.6 Heparin Dosing Weight: 76  Vital Signs: Temp: 97.6 F (36.4 C) (03/29 0802) Temp Source: Oral (03/29 0802) BP: 88/64 (03/29 1400) Pulse Rate: 110 (03/29 1600)  Labs: Recent Labs    07/13/19 0430 07/13/19 0430 07/14/19 0142 07/14/19 1005 07/14/19 1946 07/15/19 0444 07/15/19 1545  HGB 12.9*   < > 12.6*  --   --  11.6*  --   HCT 38.6*  --  37.7*  --   --  34.7*  --   PLT 216  --  219  --   --  236  --   APTT  --   --   --    < > 116* 141* 95*  HEPARINUNFRC  --   --   --   --   --  1.66*  --   CREATININE 1.88*  --  2.07*  --   --  1.80*  --    < > = values in this interval not displayed.    Estimated Creatinine Clearance: 40.5 mL/min (A) (by C-G formula based on SCr of 1.8 mg/dL (H)).   Medical History: Past Medical History:  Diagnosis Date  . Abdominal aortic aneurysm ([place])   . AICD (automatic cardioverter/defibrillator) present    St Jude  . Anemia    iron deficiency anemia,takes iron pill daily  . Arthritis   . Coronary artery disease    AWMI 2001  . Diverticulosis   . GERD (gastroesophageal reflux disease)    takes Protonix daily  . History of colon polyps    benign  . History of migraine 40 yrs ago  . History of shingles   . Hyperlipidemia    takes Atorvastatin and Niacin daily  . Hypertension    takes Diovan,Spironolactone, and Carvedilol daily  . ICD (implantable cardioverter-defibrillator), single, in situ   . Ischemic cardiomyopathy   . Joint swelling   . LV dysfunction    takes Digoxin daily  . Myocardial infarction ([place]) 2001  . Pneumonia 40 yrs ago   hx of  . Shortness of breath dyspnea    with exertion.States he was a smoker for yr  . Status post coronary artery bypass grafting   . Type 2 diabetes mellitus (HCC)     takes Glipizide,Metformin,Invokana,and Lantus daily.Average fasting blood sugar runs about 140    Assessment: 74 year old male with new onset afib recently started on apixaban 3/26, received 3 doses with last on 3/27pm.  Concern for low output and cardiogenic shock, apixaban has been switched to IV heparin in case procedures are needed. Will utilize aptt's to monitor and dose adjust for the next couple of days until heparin levels and aptt correlate.    APTT came back therapeutic at 95, on 850 units/hr. Hgb 11.6, plt 236. No s/sx of bleeding or infusion issues.   Goal of Therapy:  Aptt goal 66-102s Heparin level 0.3-0.7 units/ml Monitor platelets by anticoagulation protocol: Yes   Plan:  Continue IV heparin at 850 units/hr.   Recheck heparin level and aPTT in AM. Continue to monitor H&H and platelets  Antonietta Jewel, PharmD, BCCCP Clinical Pharmacist  Phone: [phone]  Please check AMION for all [place] phone numbers After 10:00 PM, call [place] [phone]  07/15/2019 4:35 PM

## 2019-07-15 NOTE — Progress Notes (Signed)
[place] for apixaban>>heparin Indication: atrial fibrillation  No Known Allergies  Patient Measurements: Height: 6' (182.9 cm) Weight: 167 lb 8.8 oz (76 kg) IBW/kg (Calculated) : 77.6 Heparin Dosing Weight: 76  Vital Signs: Temp: 97.8 F (36.6 C) (03/29 0400) Temp Source: Oral (03/29 0400) BP: 85/59 (03/29 0500) Pulse Rate: 83 (03/29 0500)  Labs: Recent Labs    07/12/19 1305 07/12/19 1305 07/12/19 1523 07/13/19 0430 07/13/19 0430 07/14/19 0142 07/14/19 1005 07/14/19 1829 07/14/19 1946 07/15/19 0444  HGB 12.9*   < >  --  12.9*   < > 12.6*  --   --   --  11.6*  HCT 39.8   < >  --  38.6*  --  37.7*  --   --   --  34.7*  PLT 210   < >  --  216  --  219  --   --   --  236  APTT  --   --   --   --   --   --    < > >200* 116* 141*  HEPARINUNFRC  --   --   --   --   --   --   --   --   --  1.66*  CREATININE 1.97*   < >  --  1.88*  --  2.07*  --   --   --  1.80*  TROPONINIHS 150*  --  156*  --   --   --   --   --   --   --    < > = values in this interval not displayed.    Estimated Creatinine Clearance: 40.5 mL/min (A) (by C-G formula based on SCr of 1.8 mg/dL (H)).   Medical History: Past Medical History:  Diagnosis Date  . Abdominal aortic aneurysm ([place])   . AICD (automatic cardioverter/defibrillator) present    St Jude  . Anemia    iron deficiency anemia,takes iron pill daily  . Arthritis   . Coronary artery disease    AWMI 2001  . Diverticulosis   . GERD (gastroesophageal reflux disease)    takes Protonix daily  . History of colon polyps    benign  . History of migraine 40 yrs ago  . History of shingles   . Hyperlipidemia    takes Atorvastatin and Niacin daily  . Hypertension    takes Diovan,Spironolactone, and Carvedilol daily  . ICD (implantable cardioverter-defibrillator), single, in situ   . Ischemic cardiomyopathy   . Joint swelling   . LV dysfunction    takes Digoxin daily  . Myocardial infarction ([place])  2001  . Pneumonia 40 yrs ago   hx of  . Shortness of breath dyspnea    with exertion.States he was a smoker for yr  . Status post coronary artery bypass grafting   . Type 2 diabetes mellitus (HCC)    takes Glipizide,Metformin,Invokana,and Lantus daily.Average fasting blood sugar runs about 140    Assessment: 74 year old male with new onset afib recently started on apixaban 3/26, received 3 doses with last on 3/27pm.  Concern for low output and cardiogenic shock, apixaban has been switched to IV heparin in case procedures are needed. Will utilize aptt's to monitor and dose adjust for the next couple of days until heparin levels and aptt correlate.    3/29 AM update:  APTT remains elevated No issues per RN  Goal of Therapy:  Aptt goal 66-102s Heparin level  0.3-0.7 units/ml Monitor platelets by anticoagulation protocol: Yes   Plan:  Hold heparin x 1 hr Re-start heparin at 850 units/hr at 0700 1500 heparin level/aPTT  Narda Bonds, PharmD, [place] Pharmacist Phone: [phone]

## 2019-07-16 LAB — CBC
HCT: 34 % — ABNORMAL LOW (ref 39.0–52.0)
Hemoglobin: 11.4 g/dL — ABNORMAL LOW (ref 13.0–17.0)
MCH: 30.9 pg (ref 26.0–34.0)
MCHC: 33.5 g/dL (ref 30.0–36.0)
MCV: 92.1 fL (ref 80.0–100.0)
Platelets: 212 10*3/uL (ref 150–400)
RBC: 3.69 MIL/uL — ABNORMAL LOW (ref 4.22–5.81)
RDW: 14.3 % (ref 11.5–15.5)
WBC: 7.9 10*3/uL (ref 4.0–10.5)
nRBC: 0 % (ref 0.0–0.2)

## 2019-07-16 LAB — APTT
aPTT: >200 seconds (ref 24–36)
aPTT: 87 seconds — ABNORMAL HIGH (ref 24–36)

## 2019-07-16 LAB — BASIC METABOLIC PANEL
Anion gap: 11 (ref 5–15)
BUN: 40 mg/dL — ABNORMAL HIGH (ref 8–23)
CO2: 21 mmol/L — ABNORMAL LOW (ref 22–32)
Calcium: 9.1 mg/dL (ref 8.9–10.3)
Chloride: 104 mmol/L (ref 98–111)
Creatinine, Ser: 1.88 mg/dL — ABNORMAL HIGH (ref 0.61–1.24)
GFR calc Af Amer: 41 mL/min — ABNORMAL LOW (ref >60)
GFR calc non Af Amer: 35 mL/min — ABNORMAL LOW (ref >60)
Glucose, Bld: 131 mg/dL — ABNORMAL HIGH (ref 70–99)
Potassium: 4.3 mmol/L (ref 3.5–5.1)
Sodium: 136 mmol/L (ref 135–145)

## 2019-07-16 LAB — GLUCOSE, CAPILLARY
Glucose-Capillary: 118 mg/dL — ABNORMAL HIGH (ref 70–99)
Glucose-Capillary: 67 mg/dL — ABNORMAL LOW (ref 70–99)
Glucose-Capillary: 175 mg/dL — ABNORMAL HIGH (ref 70–99)
Glucose-Capillary: 66 mg/dL — ABNORMAL LOW (ref 70–99)
Glucose-Capillary: 71 mg/dL (ref 70–99)
Glucose-Capillary: 136 mg/dL — ABNORMAL HIGH (ref 70–99)

## 2019-07-16 LAB — COOXEMETRY PANEL
Carboxyhemoglobin: 0.8 % (ref 0.5–1.5)
Carboxyhemoglobin: 1 % (ref 0.5–1.5)
Carboxyhemoglobin: 1 % (ref 0.5–1.5)
Methemoglobin: 1.3 % (ref 0.0–1.5)
Methemoglobin: 1.2 % (ref 0.0–1.5)
Methemoglobin: 1.3 % (ref 0.0–1.5)
O2 Saturation: 37.3 %
O2 Saturation: 47.3 %
O2 Saturation: 43.9 %
Total hemoglobin: 11.2 g/dL — ABNORMAL LOW (ref 12.0–16.0)
Total hemoglobin: 10.7 g/dL — ABNORMAL LOW (ref 12.0–16.0)
Total hemoglobin: 11.9 g/dL — ABNORMAL LOW (ref 12.0–16.0)

## 2019-07-16 LAB — PROTIME-INR
INR: 1.3 — ABNORMAL HIGH (ref 0.8–1.2)
Prothrombin Time: 16.5 seconds — ABNORMAL HIGH (ref 11.4–15.2)

## 2019-07-16 LAB — HEPARIN LEVEL (UNFRACTIONATED)
Heparin Unfractionated: 1.38 IU/mL — ABNORMAL HIGH (ref 0.30–0.70)
Heparin Unfractionated: 1 IU/mL — ABNORMAL HIGH (ref 0.30–0.70)

## 2019-07-16 MED ORDER — SODIUM CHLORIDE 0.9 % IV SOLN: INTRAVENOUS | Status: DC

## 2019-07-16 NOTE — Progress Notes (Addendum)
Advanced Heart Failure Rounding Note   Subjective:    Remains on milrinone 0.25. Co-ox 47%  On IV amio and converted to NSR this am.  Feels ok. Denies SOB at rest or orthopnea. CVP 2-3   Objective:   Weight Range:  Vital Signs:   Temp:  [97.6 F (36.4 C)-97.8 F (36.6 C)] 97.8 F (36.6 C) (03/30 1228) Pulse Rate:  [65-113] 87 (03/30 1300) Resp:  [10-29] 19 (03/30 1300) BP: (75-115)/(52-84) 111/77 (03/30 1300) SpO2:  [91 %-100 %] 93 % (03/30 1300) Arterial Line BP: (75-122)/(38-69) 95/69 (03/30 1300) Weight:  [76.3 kg] 76.3 kg (03/30 0356) Last BM Date: 07/15/19  Weight change: Filed Weights   07/14/19 0404 07/15/19 0405 07/16/19 0356  Weight: 75.7 kg 76 kg 76.3 kg    Intake/Output:   Intake/Output Summary (Last 24 hours) at 07/16/2019 1546 Last data filed at 07/16/2019 1300 Gross per 24 hour  Intake 1108.43 ml  Output 1200 ml  Net -91.57 ml     Physical Exam: General:  Sitting up in bed  No resp difficulty HEENT: normal Neck: supple. no JVD. Carotids 2+ bilat; no bruits. No lymphadenopathy or thryomegaly appreciated. Cor: PMI nondisplaced. Regular rate & rhythm. No rubs, gallops or murmurs. Lungs: clear Abdomen: soft, nontender, nondistended. No hepatosplenomegaly. No bruits or masses. Good bowel sounds. Extremities: no cyanosis, clubbing, rash, edema Neuro: alert & orientedx3, cranial nerves grossly intact. moves all 4 extremities w/o difficulty. Affect pleasant   Telemetry: NSR 80-90s Personally reviewed   Labs: Basic Metabolic Panel: Recent Labs  Lab 07/12/19 1305 07/12/19 1305 07/13/19 0430 07/13/19 0430 07/14/19 0142 07/15/19 0444 07/16/19 0358  NA 134*  --  134*  --  136 136 136  K 4.7  --  4.6  --  4.9 4.5 4.3  CL 103  --  103  --  104 106 104  CO2 15*  --  18*  --  20* 17* 21*  GLUCOSE 183*  --  157*  --  118* 153* 131*  BUN 48*  --  48*  --  53* 49* 40*  CREATININE 1.97*  --  1.88*  --  2.07* 1.80* 1.88*  CALCIUM 9.4   < > 9.4    < > 9.2 8.9 9.1   < > = values in this interval not displayed.    Liver Function Tests: Recent Labs  Lab 07/14/19 0142  AST 55*  ALT 58*  ALKPHOS 52  BILITOT 0.8  PROT 6.6  ALBUMIN 3.4*   No results for input(s): LIPASE, AMYLASE in the last 168 hours. No results for input(s): AMMONIA in the last 168 hours.  CBC: Recent Labs  Lab 07/12/19 1305 07/13/19 0430 07/14/19 0142 07/15/19 0444 07/16/19 0358  WBC 8.2 9.1 8.5 7.9 7.9  HGB 12.9* 12.9* 12.6* 11.6* 11.4*  HCT 39.8 38.6* 37.7* 34.7* 34.0*  MCV 93.9 91.7 91.3 91.1 92.1  PLT 210 216 219 236 212    Cardiac Enzymes: No results for input(s): CKTOTAL, CKMB, CKMBINDEX, TROPONINI in the last 168 hours.  BNP: BNP (last 3 results) Recent Labs    06/19/19 1511 07/12/19 1323 07/14/19 0142  BNP 1,000.0* 1,976.9* 1,176.1*    ProBNP (last 3 results) No results for input(s): PROBNP in the last 8760 hours.    Other results:  Imaging: No results found.   Medications:     Scheduled Medications: . aspirin  81 mg Oral Daily  . atorvastatin  40 mg Oral Daily  . Chlorhexidine Gluconate Cloth  6 each Topical Daily  . docusate sodium  100 mg Oral Daily  . insulin aspart  0-9 Units Subcutaneous TID WC  . insulin glargine  30 Units Subcutaneous QHS  . pantoprazole  40 mg Oral Daily  . polyethylene glycol  17 g Oral Daily  . sodium chloride flush  10-40 mL Intracatheter Q12H  . sodium chloride flush  3 mL Intravenous Once    Infusions: . sodium chloride    . sodium chloride    . amiodarone 30 mg/hr (07/16/19 1300)  . heparin 850 Units/hr (07/16/19 1300)  . milrinone 0.25 mcg/kg/min (07/16/19 1300)    PRN Medications: Place/Maintain arterial line **AND** sodium chloride, acetaminophen, albuterol, nitroGLYCERIN, ondansetron (ZOFRAN) IV, sodium chloride flush, sorbitol   Assessment/Plan:   1. Acute on chronic systolic HF -> cardiogenic shock - due to mixed iCM/NICM - baseline EF 30-35% due to iCM. EF this  admit 10-15% due to tachy-induced CM - Initial co-ox 37% -> milrinone started 3/28.  - Back in NSR but co-ox back down in shock range despite milrinone 0.25. Suspect he may be a bit overdiureed.  - Continue to hold diuretics. Can give a little fluid back as needed. Would not titrate milrinone yet unless symptomatically worse or end-organ function deteriorating - Will start low-dose digoxin. Watch level closely with CKD - no b-blocker with low output - No ARNI/ARB with AKI  - worsening CM likely due to AF (tachy-mediated CM) but cannot exclude worsening CAD. May need cath at some point  2. Afib/flutler, Persistent - with RVR. - likely source of CM - now in NSR on IV amio> Wil continue while on inotropes  - continue heparin   3. AKI - baseline creatinine ~1.2 - now 2.1 -> 1.8 -> 1.88 - continue to follow  4. CAD s/p CABG - s/p anterior MI in 2001 followed by CABG - no recent cath but denies angina - medical therapy for now in setting of AKI    CRITICAL CARE Performed by: Glori Bickers  Total critical care time: 35 minutes  Critical care time was exclusive of separately billable procedures and treating other patients.  Critical care was necessary to treat or prevent imminent or life-threatening deterioration.  Critical care was time spent personally by me (independent of midlevel providers or residents) on the following activities: development of treatment plan with patient and/or surrogate as well as nursing, discussions with consultants, evaluation of patient's response to treatment, examination of patient, obtaining history from patient or surrogate, ordering and performing treatments and interventions, ordering and review of laboratory studies, ordering and review of radiographic studies, pulse oximetry and re-evaluation of patient's condition.     Length of Stay: 4   Glori Bickers MD 07/16/2019, 3:46 PM  Advanced Heart Failure Team Pager [phone] (M-F; [place])  Please contact [facility] for night-coverage after hours (4p -7a ) and weekends on [email or web address]

## 2019-07-16 NOTE — Significant Event (Signed)
Ambulated to 2C01 from [place], VS stable, HR remains in the 80s, sinus. All personal belongings taken to new room (clothes, shoes, cell-phone, charger). Family made aware of transfer, communicated by patient. Report given to receiving RN. Patient settled sitting up in bed with receiving staff in room.    Jordan Ross

## 2019-07-16 NOTE — Accreditation Note (Signed)
Hypoglycemic Event  CBG: Two hypoglycemic events during day-shift of 07/16/2019. Patient asymptomatic on both occasions.   0853am-CBG=66 (Repeat @ 0924, CBG=118). 1645pm-CBG=66 (Repeat @ 1714, CBG =71)  Treatment:Juice, then ate from tray  Symptoms: No symptoms.  Possible Reasons for Event: NPO over night; ate lunch tray early.   Comments/MD notified: Follow hypoglycemic protocol.    Jordan Ross

## 2019-07-16 NOTE — Progress Notes (Signed)
[place] for apixaban>>heparin Indication: atrial fibrillation  No Known Allergies  Patient Measurements: Height: 6' (182.9 cm) Weight: 168 lb 3.4 oz (76.3 kg) IBW/kg (Calculated) : 77.6 Heparin Dosing Weight: 76  Vital Signs: Temp: 97.8 F (36.6 C) (03/30 0400) Temp Source: Oral (03/30 0400) BP: 95/71 (03/30 0500) Pulse Rate: 77 (03/30 0500)  Labs: Recent Labs    07/14/19 0142 07/14/19 1005 07/15/19 0444 07/15/19 0444 07/15/19 1545 07/16/19 0358 07/16/19 0549  HGB 12.6*  --  11.6*  --   --  11.4*  --   HCT 37.7*  --  34.7*  --   --  34.0*  --   PLT 219  --  236  --   --  212  --   APTT  --    < > 141*   < > 95* >200* 87*  LABPROT  --   --   --   --   --  16.5*  --   INR  --   --   --   --   --  1.3*  --   HEPARINUNFRC  --   --  1.66*   < > 1.24* 1.38* 1.00*  CREATININE 2.07*  --  1.80*  --   --  1.88*  --    < > = values in this interval not displayed.    Estimated Creatinine Clearance: 38.9 mL/min (A) (by C-G formula based on SCr of 1.88 mg/dL (H)).   Medical History: Past Medical History:  Diagnosis Date  . Abdominal aortic aneurysm ([place])   . AICD (automatic cardioverter/defibrillator) present    St Jude  . Anemia    iron deficiency anemia,takes iron pill daily  . Arthritis   . Coronary artery disease    AWMI 2001  . Diverticulosis   . GERD (gastroesophageal reflux disease)    takes Protonix daily  . History of colon polyps    benign  . History of migraine 40 yrs ago  . History of shingles   . Hyperlipidemia    takes Atorvastatin and Niacin daily  . Hypertension    takes Diovan,Spironolactone, and Carvedilol daily  . ICD (implantable cardioverter-defibrillator), single, in situ   . Ischemic cardiomyopathy   . Joint swelling   . LV dysfunction    takes Digoxin daily  . Myocardial infarction ([place]) 2001  . Pneumonia 40 yrs ago   hx of  . Shortness of breath dyspnea    with exertion.States he was a smoker  for yr  . Status post coronary artery bypass grafting   . Type 2 diabetes mellitus (HCC)    takes Glipizide,Metformin,Invokana,and Lantus daily.Average fasting blood sugar runs about 140    Assessment: 74 year old male with new onset afib recently started on apixaban 3/26, received 3 doses with last on 3/27pm.  Concern for low output and cardiogenic shock, apixaban has been switched to IV heparin in case procedures are needed. Will utilize aptt's to monitor and dose adjust for the next couple of days until heparin levels and aptt correlate.    APTT this morning was supratherapeutic > 200 drawn from PICC where heparin is running. Re-draw from a-line was therapeutic at 87 seconds on 850 units/hour. Heparin level remains falsely elevated at 1.00. H&H today is stable at 11.4/34.0, plts wnl at 212. No bleeding noted. Creatinine today is 1.88, renal function is stable.   Goal of Therapy:  Aptt goal 66-102s Heparin level 0.3-0.7 units/ml Monitor platelets by anticoagulation  protocol: Yes   Plan:  Continue heparin infusion at 850 units/hour Daily heparin level and aPTT until correlate Continue to monitor H&H and platelets Monitor for signs of bleeding    Thank you,   Eddie Candle, PharmD PGY-1 Pharmacy Resident   Please check amion for clinical pharmacist contact number After 10:00 PM, call [place] [phone]   07/16/2019 7:16 AM

## 2019-07-16 NOTE — Significant Event (Signed)
Confirmed with Amy Clegg regarding TEE in which she stated is scheduled for tomorrow 3/31. Per Amy, patient can have a diet today-will reorder diet.   Corretta Munce

## 2019-07-16 NOTE — Progress Notes (Signed)
CRITICAL VALUE ALERT  Critical Value:  APTT >200  Date & Time Notied:  03/30 @0530   Provider Notified: N/A  Orders Received/Actions taken: Will draw another aPTT. Will continue to monitor

## 2019-07-17 ENCOUNTER — Encounter (HOSPITAL_COMMUNITY)
Admission: EM | Disposition: A | Discharge status: Home Health | Payer: Self-pay | Source: Ambulatory Visit | Attending: Internal Medicine

## 2019-07-17 ENCOUNTER — Ambulatory Visit: Payer: Medicare Other | Admitting: Medical

## 2019-07-17 LAB — GLUCOSE, CAPILLARY
Glucose-Capillary: 106 mg/dL — ABNORMAL HIGH (ref 70–99)
Glucose-Capillary: 110 mg/dL — ABNORMAL HIGH (ref 70–99)
Glucose-Capillary: 230 mg/dL — ABNORMAL HIGH (ref 70–99)
Glucose-Capillary: 209 mg/dL — ABNORMAL HIGH (ref 70–99)

## 2019-07-17 LAB — BASIC METABOLIC PANEL
Anion gap: 11 (ref 5–15)
BUN: 32 mg/dL — ABNORMAL HIGH (ref 8–23)
CO2: 22 mmol/L (ref 22–32)
Calcium: 9.5 mg/dL (ref 8.9–10.3)
Chloride: 104 mmol/L (ref 98–111)
Creatinine, Ser: 1.78 mg/dL — ABNORMAL HIGH (ref 0.61–1.24)
GFR calc Af Amer: 44 mL/min — ABNORMAL LOW (ref >60)
GFR calc non Af Amer: 38 mL/min — ABNORMAL LOW (ref >60)
Glucose, Bld: 69 mg/dL — ABNORMAL LOW (ref 70–99)
Potassium: 4 mmol/L (ref 3.5–5.1)
Sodium: 137 mmol/L (ref 135–145)

## 2019-07-17 LAB — CBC
HCT: 35.4 % — ABNORMAL LOW (ref 39.0–52.0)
Hemoglobin: 11.7 g/dL — ABNORMAL LOW (ref 13.0–17.0)
MCH: 30.6 pg (ref 26.0–34.0)
MCHC: 33.1 g/dL (ref 30.0–36.0)
MCV: 92.7 fL (ref 80.0–100.0)
Platelets: 215 10*3/uL (ref 150–400)
RBC: 3.82 MIL/uL — ABNORMAL LOW (ref 4.22–5.81)
RDW: 14.6 % (ref 11.5–15.5)
WBC: 8.6 10*3/uL (ref 4.0–10.5)
nRBC: 0 % (ref 0.0–0.2)

## 2019-07-17 LAB — COOXEMETRY PANEL
Carboxyhemoglobin: 0.7 % (ref 0.5–1.5)
Methemoglobin: 0.8 % (ref 0.0–1.5)
O2 Saturation: 52.8 %
Total hemoglobin: 11.6 g/dL — ABNORMAL LOW (ref 12.0–16.0)

## 2019-07-17 LAB — APTT: aPTT: 73 seconds — ABNORMAL HIGH (ref 24–36)

## 2019-07-17 LAB — HEPARIN LEVEL (UNFRACTIONATED): Heparin Unfractionated: 0.69 IU/mL (ref 0.30–0.70)

## 2019-07-17 SURGERY — CARDIOVERSION
Anesthesia: General

## 2019-07-17 MED ORDER — FUROSEMIDE 10 MG/ML IJ SOLN
40.0000 mg | Freq: Once | INTRAMUSCULAR | Status: AC
  Administered 2019-07-17: 40 mg via INTRAVENOUS
  Filled 2019-07-17: qty 4

## 2019-07-17 MED ORDER — LIVING WELL WITH DIABETES BOOK
Freq: Once | Status: AC
  Filled 2019-07-17: qty 1

## 2019-07-17 MED ORDER — DIGOXIN 125 MCG PO TABS
0.1250 mg | ORAL_TABLET | Freq: Every day | ORAL | Status: DC
  Administered 2019-07-17 – 2019-07-22 (×6): 0.125 mg via ORAL
  Filled 2019-07-17 (×6): qty 1

## 2019-07-17 NOTE — Progress Notes (Signed)
Inpatient Diabetes Program Recommendations  AACE/ADA: New Consensus Statement on Inpatient Glycemic Control (2015)  Target Ranges:  Prepandial:   less than 140 mg/dL      Peak postprandial:   less than 180 mg/dL (1-2 hours)      Critically ill patients:  140 - 180 mg/dL   Lab Results  Component Value Date   GLUCAP 106 (H) 07/17/2019   HGBA1C 8.6 (H) 05/19/2017    Review of Glycemic Control Results for VINCEN, PENTA (MRN [id number]) as of 07/17/2019 08:58  Ref. Range 07/16/2019 12:26 07/16/2019 16:45 07/16/2019 17:14 07/16/2019 21:28 07/17/2019 06:23  Glucose-Capillary Latest Ref Range: 70 - 99 mg/dL 175 (H) 66 (L) 71 136 (H) 106 (H)   Diabetes history: DM2 Outpatient Diabetes medications: Lantus 30 units + Glucotrol 10 mg bid + Metformin 1 gm bid Current orders for Inpatient glycemic control: Lantus 30 units + Novolog sensitive correction tid  Inpatient Diabetes Program Recommendations:   Noted hypoglycemic events.  -Decrease Lantus to 25 units qd -A1c to determine prehospital glycemic control  Spoke with patient to clarify insulin dosing @ home and noted patient had taken up to 70 units Toujeo daily but currently on the Lantus 30 units daily. Patient states he has not been having low CBGs @ home.  Thank you, Nani Gasser. Tarisa Paola, RN, MSN, CDE  Diabetes Coordinator Inpatient Glycemic Control Team Team Pager [phone] (8am-5pm) 07/17/2019 9:10 AM

## 2019-07-17 NOTE — Progress Notes (Signed)
[place] for apixaban>>heparin Indication: atrial fibrillation  No Known Allergies  Patient Measurements: Height: 6' (182.9 cm) Weight: 169 lb 5 oz (76.8 kg) IBW/kg (Calculated) : 77.6 Heparin Dosing Weight: 76  Vital Signs: Temp: 97.5 F (36.4 C) (03/31 0410) Temp Source: Oral (03/31 0410) BP: 122/95 (03/31 0410) Pulse Rate: 80 (03/31 0410)  Labs: Recent Labs    07/15/19 0444 07/15/19 1545 07/16/19 0358 07/16/19 0549 07/17/19 0429  HGB 11.6*  --  11.4*  --  11.7*  HCT 34.7*  --  34.0*  --  35.4*  PLT 236  --  212  --  215  APTT 141*   < > >200* 87* 73*  LABPROT  --   --  16.5*  --   --   INR  --   --  1.3*  --   --   HEPARINUNFRC 1.66*   < > 1.38* 1.00* 0.69  CREATININE 1.80*  --  1.88*  --  1.78*   < > = values in this interval not displayed.    Estimated Creatinine Clearance: 41.3 mL/min (A) (by C-G formula based on SCr of 1.78 mg/dL (H)).   Medical History: Past Medical History:  Diagnosis Date  . Abdominal aortic aneurysm ([place])   . AICD (automatic cardioverter/defibrillator) present    St Jude  . Anemia    iron deficiency anemia,takes iron pill daily  . Arthritis   . Coronary artery disease    AWMI 2001  . Diverticulosis   . GERD (gastroesophageal reflux disease)    takes Protonix daily  . History of colon polyps    benign  . History of migraine 40 yrs ago  . History of shingles   . Hyperlipidemia    takes Atorvastatin and Niacin daily  . Hypertension    takes Diovan,Spironolactone, and Carvedilol daily  . ICD (implantable cardioverter-defibrillator), single, in situ   . Ischemic cardiomyopathy   . Joint swelling   . LV dysfunction    takes Digoxin daily  . Myocardial infarction ([place]) 2001  . Pneumonia 40 yrs ago   hx of  . Shortness of breath dyspnea    with exertion.States he was a smoker for yr  . Status post coronary artery bypass grafting   . Type 2 diabetes mellitus (HCC)    takes  Glipizide,Metformin,Invokana,and Lantus daily.Average fasting blood sugar runs about 140    Assessment: 74 year old male with new onset afib recently started on apixaban 3/26, received 3 doses with last on 3/27pm.  Concern for low output and cardiogenic shock, apixaban has been switched to IV heparin in case procedures are needed. Will utilize aptt's to monitor and dose adjust for the next couple of days until heparin levels and aptt correlate.    APTT is therapeutic at 73, heparin level is also therapeutic but on higher end of goal at 0.69, on 850 units/hr- anticipate correlation possibly 4/1. Hgb 11.7, plt 215. No s/sx of bleeding or infusion issues.   Goal of Therapy:  Aptt goal 66-102s Heparin level 0.3-0.7 units/ml Monitor platelets by anticoagulation protocol: Yes   Plan:  Continue heparin infusion at 850 units/hour Daily heparin level and aPTT until correlate Continue to monitor H&H and platelets Monitor for signs of bleeding  F/u resp status and procedure plans then transition to apixaban  Thank you,   Antonietta Jewel, PharmD, BCCCP Clinical Pharmacist  Phone: [phone]  Please check AMION for all [place] phone numbers After 10:00 PM, call [facility]arm[facility] [phone]  07/17/2019 7:26 AM

## 2019-07-17 NOTE — Progress Notes (Addendum)
Advanced Heart Failure Rounding Note   Subjective:    Remains on milrinone 0.25. CO-OX 53%   Remains on amio drip. Maintaining NSR.   SOB with exertion.   Objective:   Weight Range:  Vital Signs:   Temp:  [97.5 F (36.4 C)-98.3 F (36.8 C)] 97.5 F (36.4 C) (03/31 1129) Pulse Rate:  [75-89] 79 (03/31 1129) Resp:  [11-27] 24 (03/31 1129) BP: (100-133)/(69-95) 104/78 (03/31 1129) SpO2:  [90 %-100 %] 100 % (03/31 1129) Arterial Line BP: (95-116)/(53-69) 100/53 (03/30 1500) Weight:  [76.8 kg] 76.8 kg (03/31 0410) Last BM Date: 07/16/19  Weight change: Filed Weights   07/15/19 0405 07/16/19 0356 07/17/19 0410  Weight: 76 kg 76.3 kg 76.8 kg    Intake/Output:   Intake/Output Summary (Last 24 hours) at 07/17/2019 1240 Last data filed at 07/17/2019 1100 Gross per 24 hour  Intake 1548.62 ml  Output 1750 ml  Net -201.38 ml    CVP  Physical Exam: General:   No resp difficulty HEENT: normal Neck: supple. JVP 9-10 . Carotids 2+ bilat; no bruits. No lymphadenopathy or thryomegaly appreciated. Cor: PMI nondisplaced. Regular rate & rhythm. No rubs, gallops or murmurs. Lungs: clear Abdomen: soft, nontender, nondistended. No hepatosplenomegaly. No bruits or masses. Good bowel sounds. Extremities: no cyanosis, clubbing, rash, edema Neuro: alert & orientedx3, cranial nerves grossly intact. moves all 4 extremities w/o difficulty. Affect pleasant   Telemetry: NSR 80-90s personally reviewed.   Labs: Basic Metabolic Panel: Recent Labs  Lab 07/13/19 0430 07/13/19 0430 07/14/19 0142 07/14/19 0142 07/15/19 0444 07/16/19 0358 07/17/19 0429  NA 134*  --  136  --  136 136 137  K 4.6  --  4.9  --  4.5 4.3 4.0  CL 103  --  104  --  106 104 104  CO2 18*  --  20*  --  17* 21* 22  GLUCOSE 157*  --  118*  --  153* 131* 69*  BUN 48*  --  53*  --  49* 40* 32*  CREATININE 1.88*  --  2.07*  --  1.80* 1.88* 1.78*  CALCIUM 9.4   < > 9.2   < > 8.9 9.1 9.5   < > = values in this  interval not displayed.    Liver Function Tests: Recent Labs  Lab 07/14/19 0142  AST 55*  ALT 58*  ALKPHOS 52  BILITOT 0.8  PROT 6.6  ALBUMIN 3.4*   No results for input(s): LIPASE, AMYLASE in the last 168 hours. No results for input(s): AMMONIA in the last 168 hours.  CBC: Recent Labs  Lab 07/13/19 0430 07/14/19 0142 07/15/19 0444 07/16/19 0358 07/17/19 0429  WBC 9.1 8.5 7.9 7.9 8.6  HGB 12.9* 12.6* 11.6* 11.4* 11.7*  HCT 38.6* 37.7* 34.7* 34.0* 35.4*  MCV 91.7 91.3 91.1 92.1 92.7  PLT 216 219 236 212 215    Cardiac Enzymes: No results for input(s): CKTOTAL, CKMB, CKMBINDEX, TROPONINI in the last 168 hours.  BNP: BNP (last 3 results) Recent Labs    06/19/19 1511 07/12/19 1323 07/14/19 0142  BNP 1,000.0* 1,976.9* 1,176.1*    ProBNP (last 3 results) No results for input(s): PROBNP in the last 8760 hours.    Other results:  Imaging: No results found.   Medications:     Scheduled Medications: . aspirin  81 mg Oral Daily  . atorvastatin  40 mg Oral Daily  . Chlorhexidine Gluconate Cloth  6 each Topical Daily  . docusate sodium  100 mg Oral Daily  . insulin aspart  0-9 Units Subcutaneous TID WC  . insulin glargine  30 Units Subcutaneous QHS  . pantoprazole  40 mg Oral Daily  . polyethylene glycol  17 g Oral Daily  . sodium chloride flush  10-40 mL Intracatheter Q12H  . sodium chloride flush  3 mL Intravenous Once    Infusions: . sodium chloride    . amiodarone 30 mg/hr (07/17/19 0424)  . heparin 850 Units/hr (07/17/19 0500)  . milrinone 0.25 mcg/kg/min (07/17/19 0500)    PRN Medications: Place/Maintain arterial line **AND** sodium chloride, acetaminophen, albuterol, nitroGLYCERIN, ondansetron (ZOFRAN) IV, sodium chloride flush, sorbitol   Assessment/Plan:   1. Acute on chronic systolic HF -> cardiogenic shock - due to mixed iCM/NICM - baseline EF 30-35% due to iCM. EF this admit 10-15% due to tachy-induced CM - Initial co-ox 37% ->  milrinone started 3/28.  - Todays CO-OX is 53%. Continue milrinone 0.25 mcg.  - Volume status trending up. Will need to add 40 mg po lasix daily. (no diuretic since 3/26)  - Start digoxin 0.125 mg daily. Watch level closely with CKD - no b-blocker with low output - No ARNI/ARB with AKI  - worsening CM likely due to AF (tachy-mediated CM) but cannot exclude worsening CAD. May need cath at some point - If unable to wean milrinone ? Advanced therapies.   2. Afib/flutler, Persistent - with RVR. - likely source of CM - Maintaining NSR. Continue amio drip while on inotropes.   - continue heparin . Would not switch to eliquis yet.   3. AKI - baseline creatinine ~1.2 - now 2.1 -> 1.8 -> 1.88->1.78 - continue to follow  4. CAD s/p CABG - s/p anterior MI in 2001 followed by CABG - no recent cath but denies angina - medical therapy for now in setting of AKI    Consult cardiac rehab.   Length of Stay: [employer] NP-C  07/17/2019, 12:40 PM  Advanced Heart Failure Team Pager [phone] (M-F; 7a - 4p)  Please contact [facility] for night-coverage after hours (4p -7a ) and weekends on [email or web address]  Patient seen and examined with the above-signed Advanced Practice Provider and/or Housestaff. I personally reviewed laboratory data, imaging studies and relevant notes. I independently examined the patient and formulated the important aspects of the plan. I have edited the note to reflect any of my changes or salient points. I have personally discussed the plan with the patient and/or family.  Continues with periods of SOB. Maintaining NSR on IV amio. Remains on milrinone 0.25. Co-ox low yesterday. Mildly improved with holding diuretics. CVP 3-4 today (checked personally). No bleeding on heparin.   General:  Sitting in chair . No resp difficulty HEENT: normal Neck: supple. no JVD. Carotids 2+ bilat; no bruits. No lymphadenopathy or thryomegaly appreciated. Cor: PMI nondisplaced. Regular  rate & rhythm. No rubs, gallops or murmurs. Lungs: clear Abdomen: soft, nontender, nondistended. No hepatosplenomegaly. No bruits or masses. Good bowel sounds. Extremities: no cyanosis, clubbing, rash, edema Neuro: alert & orientedx3, cranial nerves grossly intact. moves all 4 extremities w/o difficulty. Affect pleasant  Remains tenuous with marginal co-ox despite milrinone. Would hold diuretics for now. Continue milrinone. Hopefully output wlll improve. Start digoxin. Can switch to apixaban.   Glori Bickers, MD  4:12 PM

## 2019-07-17 NOTE — Progress Notes (Signed)
CARDIAC REHAB PHASE I   PRE:  Rate/Rhythm: 81 SR    BP: sitting 105/77    SaO2: 100 RA  MODE:  Ambulation: 400 ft   POST:  Rate/Rhythm: 96 SR    BP: sitting 124/82     SaO2: 98 RA  Tolerated well. Steady, SOB much less than on admit. VSS. Pt happy to walk. Gave education materials to begin reading (HF booklet, low sodium and carb counting diets). He also has DM book in room. Encouraged chair and more walking. Will f/u tomorrow. [place], ACSM 07/17/2019 1:55 PM

## 2019-07-18 LAB — COOXEMETRY PANEL
Carboxyhemoglobin: 1 % (ref 0.5–1.5)
Methemoglobin: 1.2 % (ref 0.0–1.5)
O2 Saturation: 53 %
Total hemoglobin: 12.4 g/dL (ref 12.0–16.0)

## 2019-07-18 LAB — GLUCOSE, CAPILLARY
Glucose-Capillary: 72 mg/dL (ref 70–99)
Glucose-Capillary: 136 mg/dL — ABNORMAL HIGH (ref 70–99)
Glucose-Capillary: 223 mg/dL — ABNORMAL HIGH (ref 70–99)
Glucose-Capillary: 263 mg/dL — ABNORMAL HIGH (ref 70–99)

## 2019-07-18 LAB — BASIC METABOLIC PANEL
Anion gap: 12 (ref 5–15)
BUN: 29 mg/dL — ABNORMAL HIGH (ref 8–23)
CO2: 23 mmol/L (ref 22–32)
Calcium: 9.5 mg/dL (ref 8.9–10.3)
Chloride: 103 mmol/L (ref 98–111)
Creatinine, Ser: 1.66 mg/dL — ABNORMAL HIGH (ref 0.61–1.24)
GFR calc Af Amer: 47 mL/min — ABNORMAL LOW (ref >60)
GFR calc non Af Amer: 41 mL/min — ABNORMAL LOW (ref >60)
Glucose, Bld: 121 mg/dL — ABNORMAL HIGH (ref 70–99)
Potassium: 4.2 mmol/L (ref 3.5–5.1)
Sodium: 138 mmol/L (ref 135–145)

## 2019-07-18 LAB — CBC
HCT: 34 % — ABNORMAL LOW (ref 39.0–52.0)
Hemoglobin: 11.1 g/dL — ABNORMAL LOW (ref 13.0–17.0)
MCH: 30.2 pg (ref 26.0–34.0)
MCHC: 32.6 g/dL (ref 30.0–36.0)
MCV: 92.6 fL (ref 80.0–100.0)
Platelets: 187 10*3/uL (ref 150–400)
RBC: 3.67 MIL/uL — ABNORMAL LOW (ref 4.22–5.81)
RDW: 14.7 % (ref 11.5–15.5)
WBC: 6.4 10*3/uL (ref 4.0–10.5)
nRBC: 0 % (ref 0.0–0.2)

## 2019-07-18 LAB — HEPARIN LEVEL (UNFRACTIONATED): Heparin Unfractionated: 0.56 IU/mL (ref 0.30–0.70)

## 2019-07-18 LAB — APTT: aPTT: 66 seconds — ABNORMAL HIGH (ref 24–36)

## 2019-07-18 MED ORDER — APIXABAN 5 MG PO TABS
5.0000 mg | ORAL_TABLET | Freq: Two times a day (BID) | ORAL | Status: DC
  Administered 2019-07-18 – 2019-07-26 (×17): 5 mg via ORAL
  Filled 2019-07-18 (×17): qty 1

## 2019-07-18 NOTE — TOC Benefit Eligibility Note (Signed)
Transition of Care [facility]) Benefit Eligibility Note    Patient Details  Name: Jordan Ross MRN: [id number] Date of Birth: [birth year]   Medication/Dose: Arne [place]  2.5 MG BID , CO-PAY- $ 37.00   and   ELIQUIS  5 MG BID , CO-PAY- $ 37.00  Covered?: Yes  Tier: 3 Drug  Prescription Coverage Preferred Pharmacy: [employer] with Person/Company/Phone Number:: TONYA  @ [employer] # [phone]  Co-Pay: $37.00 FOR EACH PRESCRIPTION  Prior Approval: No  Deductible: Met       [facility] [place] Phone Number: 07/18/2019, 3:45 PM

## 2019-07-18 NOTE — Plan of Care (Signed)
  Problem: Clinical Measurements: Goal: Ability to maintain clinical measurements within normal limits will improve Outcome: Progressing Goal: Will remain free from infection Outcome: Progressing Goal: Respiratory complications will improve Outcome: Progressing Goal: Cardiovascular complication will be avoided Outcome: Progressing   Problem: Coping: Goal: Level of anxiety will decrease Outcome: Progressing   Problem: Elimination: Goal: Will not experience complications related to bowel motility Outcome: Progressing Goal: Will not experience complications related to urinary retention Outcome: Progressing   Problem: Pain Managment: Goal: General experience of comfort will improve Outcome: Progressing   Problem: Safety: Goal: Ability to remain free from injury will improve Outcome: Progressing   Problem: Skin Integrity: Goal: Risk for impaired skin integrity will decrease Outcome: Progressing   Problem: Education: Goal: Ability to demonstrate management of disease process will improve Outcome: Progressing Goal: Ability to verbalize understanding of medication therapies will improve Outcome: Progressing Goal: Individualized Educational Video(s) Outcome: Progressing   Problem: Activity: Goal: Capacity to carry out activities will improve Outcome: Progressing   Problem: Cardiac: Goal: Ability to achieve and maintain adequate cardiopulmonary perfusion will improve Outcome: Progressing

## 2019-07-18 NOTE — Progress Notes (Addendum)
Advanced Heart Failure Rounding Note   Subjective:    Remains on milrinone 0.25. CO-OX remains marginal 53%.  Maintaining NSR on tele. No breakthrough flutter/fib. HR 60s-70s.   Volume status good. CVP 5.   OOB sitting in chair. No cardiac complaints. Denies resting dyspnea. No CP.      Objective:   Weight Range:  Vital Signs:   Temp:  [97.4 F (36.3 C)-98.3 F (36.8 C)] 97.8 F (36.6 C) (04/01 0400) Pulse Rate:  [74-83] 74 (04/01 0327) Resp:  [15-24] 19 (04/01 0400) BP: (98-116)/(63-82) 98/63 (04/01 0325) SpO2:  [96 %-100 %] 100 % (04/01 0327) Weight:  [76.1 kg] 76.1 kg (04/01 0500) Last BM Date: 07/17/19  Weight change: Filed Weights   07/16/19 0356 07/17/19 0410 07/18/19 0500  Weight: 76.3 kg 76.8 kg 76.1 kg    Intake/Output:   Intake/Output Summary (Last 24 hours) at 07/18/2019 0707 Last data filed at 07/18/2019 0700 Gross per 24 hour  Intake 1476.83 ml  Output 3200 ml  Net -1723.17 ml    PHYSICAL EXAM: CVP 5 General:  Well appearing WM sitting up in chair. No respiratory difficulty HEENT: normal Neck: supple. no JVD. Carotids 2+ bilat; no bruits. No lymphadenopathy or thyromegaly appreciated. Cor: PMI nondisplaced. Regular rate & rhythm. No rubs, gallops or murmurs. Lungs: clear Abdomen: soft, nontender, nondistended. No hepatosplenomegaly. No bruits or masses. Good bowel sounds. Extremities: no cyanosis, clubbing, rash, edema, LEs slightly cool to touch Neuro: alert & oriented x 3, cranial nerves grossly intact. moves all 4 extremities w/o difficulty. Affect pleasant.   Telemetry: NSR 60s-70s personally reviewed.   Labs: Basic Metabolic Panel: Recent Labs  Lab 07/14/19 0142 07/14/19 0142 07/15/19 0444 07/15/19 0444 07/16/19 0358 07/17/19 0429 07/18/19 0314  NA 136  --  136  --  136 137 138  K 4.9  --  4.5  --  4.3 4.0 4.2  CL 104  --  106  --  104 104 103  CO2 20*  --  17*  --  21* 22 23  GLUCOSE 118*  --  153*  --  131* 69* 121*  BUN  53*  --  49*  --  40* 32* 29*  CREATININE 2.07*  --  1.80*  --  1.88* 1.78* 1.66*  CALCIUM 9.2   < > 8.9   < > 9.1 9.5 9.5   < > = values in this interval not displayed.    Liver Function Tests: Recent Labs  Lab 07/14/19 0142  AST 55*  ALT 58*  ALKPHOS 52  BILITOT 0.8  PROT 6.6  ALBUMIN 3.4*   No results for input(s): LIPASE, AMYLASE in the last 168 hours. No results for input(s): AMMONIA in the last 168 hours.  CBC: Recent Labs  Lab 07/14/19 0142 07/15/19 0444 07/16/19 0358 07/17/19 0429 07/18/19 0314  WBC 8.5 7.9 7.9 8.6 6.4  HGB 12.6* 11.6* 11.4* 11.7* 11.1*  HCT 37.7* 34.7* 34.0* 35.4* 34.0*  MCV 91.3 91.1 92.1 92.7 92.6  PLT 219 236 212 215 187    Cardiac Enzymes: No results for input(s): CKTOTAL, CKMB, CKMBINDEX, TROPONINI in the last 168 hours.  BNP: BNP (last 3 results) Recent Labs    06/19/19 1511 07/12/19 1323 07/14/19 0142  BNP 1,000.0* 1,976.9* 1,176.1*    ProBNP (last 3 results) No results for input(s): PROBNP in the last 8760 hours.    Other results:  Imaging: No results found.   Medications:     Scheduled Medications: . aspirin  81 mg Oral Daily  . atorvastatin  40 mg Oral Daily  . Chlorhexidine Gluconate Cloth  6 each Topical Daily  . digoxin  0.125 mg Oral Daily  . docusate sodium  100 mg Oral Daily  . insulin aspart  0-9 Units Subcutaneous TID WC  . insulin glargine  30 Units Subcutaneous QHS  . pantoprazole  40 mg Oral Daily  . polyethylene glycol  17 g Oral Daily  . sodium chloride flush  10-40 mL Intracatheter Q12H  . sodium chloride flush  3 mL Intravenous Once    Infusions: . sodium chloride    . amiodarone 30 mg/hr (07/18/19 0515)  . heparin 850 Units/hr (07/17/19 2213)  . milrinone 0.25 mcg/kg/min (07/18/19 0657)    PRN Medications: Place/Maintain arterial line **AND** sodium chloride, acetaminophen, albuterol, nitroGLYCERIN, ondansetron (ZOFRAN) IV, sodium chloride flush, sorbitol   Assessment/Plan:    1. Acute on chronic systolic HF -> cardiogenic shock - due to mixed iCM/NICM - baseline EF 30-35% due to iCM. EF this admit 10-15% due to tachy-induced CM - Initial co-ox 37% -> milrinone started 3/28.  - Todays CO-OX is 53%. Continue milrinone 0.25 mcg.  - Volume status stable. CVP 5. Wt down 2 lb. Continue to hold diuretics. - Continue digoxin 0.125 mg daily. Watch level closely with CKD - no b-blocker with low output - No ARNI/ARB with AKI  - worsening CM likely due to AF (tachy-mediated CM) but cannot exclude worsening CAD. May need cath at some point - If unable to wean milrinone ? Advanced therapies.   2. Afib/flutler, Persistent - with RVR. Now in NSR on amiodarone.  - likely source of CM - Maintaining NSR. Continue amio drip while on inotropes.   - on heparin . Change to eliquis 5 mg bid   3. AKI - baseline creatinine ~1.2 - now 2.1 -> 1.8 -> 1.88->1.78->1.66 - continue to follow  4. CAD s/p CABG - s/p anterior MI in 2001 followed by CABG - no recent cath but denies angina - medical therapy for now in setting of AKI      Length of Stay: [place] NP-C  07/18/2019, 7:07 AM  Advanced Heart Failure Team Pager [phone] (M-F; 7a - 4p)  Please contact [facility] for night-coverage after hours (4p -7a ) and weekends on [email or web address]   Patient seen and examined with the above-signed Advanced Practice Provider and/or Housestaff. I personally reviewed laboratory data, imaging studies and relevant notes. I independently examined the patient and formulated the important aspects of the plan. I have edited the note to reflect any of my changes or salient points. I have personally discussed the plan with the patient and/or family.  Remains on milrinone 0.25. Co-ox marginal at 53%. CVP 5-6. Remains in NSR on IV amio. On heparin. No bleeding.   General:  Well appearing. No resp difficulty HEENT: normal Neck: supple. no JVD. Carotids 2+ bilat; no bruits. No  lymphadenopathy or thryomegaly appreciated. Cor: PMI nondisplaced. Regular rate & rhythm. No rubs, gallops or murmurs. Lungs: clear Abdomen: soft, nontender, nondistended. No hepatosplenomegaly. No bruits or masses. Good bowel sounds. Extremities: no cyanosis, clubbing, rash, edema Neuro: alert & orientedx3, cranial nerves grossly intact. moves all 4 extremities w/o difficulty. Affect pleasant  Remains tenuous. On milrinone 0.25 with marginal co-ox. Will continue at current dose. Add losartan 12.5 qhs. Continue digoxin. Hopefully can wean milrinone soon.   Glori Bickers, MD  4:12 PM

## 2019-07-18 NOTE — Discharge Instructions (Signed)

## 2019-07-18 NOTE — Progress Notes (Signed)
CARDIAC REHAB PHASE I   PRE:  Rate/Rhythm: 80 SR    BP: sitting 103/68    SaO2: 93 RA  MODE:  Ambulation: 750 ft   POST:  Rate/Rhythm: 96 SR    BP: sitting 118/73     SaO2: 96 RA  Tolerated well, no c/o. He doesn't complain much, sts he didn't know how sick he was on admit. Began education. Discussed daily wts, signs of fluid, and low sodium diet. Receptive. Pt wants to wait to read materials at home but he did want to watch HF video. Set it up for him. Encouraged more walking.  [place], ACSM 07/18/2019 11:34 AM

## 2019-07-18 NOTE — Progress Notes (Addendum)
Transitions of Care Pharmacist Note  Jordan Ross is a 74 y.o. male that has been diagnosed with A Fib and will be prescribed Eliquis (apixaban) at discharge.   Patient Education: I provided the following education on Apixaban to the patient: How to take the medication Described what the medication is Signs of bleeding Signs/symptoms of VTE and stroke  Answered their questions  Discharge Medications Plan: The patient wants to have their discharge medications filled by the Transitions of Care pharmacy rather than their usual pharmacy.  The discharge orders pharmacy has been changed to the Transitions of Care pharmacy, the patient will receive a phone call regarding co-pay, and their medications will be delivered by the Transitions of Care pharmacy.   Thank you,   Lorel Monaco, PharmD PGY1 Ambulatory Care Resident [facility] # [phone]

## 2019-07-18 NOTE — Plan of Care (Signed)
  Problem: Clinical Measurements: Goal: Ability to maintain clinical measurements within normal limits will improve Outcome: Progressing Goal: Will remain free from infection Outcome: Progressing Goal: Respiratory complications will improve Outcome: Progressing Goal: Cardiovascular complication will be avoided Outcome: Progressing   Problem: Coping: Goal: Level of anxiety will decrease Outcome: Progressing   

## 2019-07-18 NOTE — Progress Notes (Signed)
[place] for apixaban>>heparin Indication: atrial fibrillation  No Known Allergies  Patient Measurements: Height: 6' (182.9 cm) Weight: 76.1 kg (167 lb 12.3 oz) IBW/kg (Calculated) : 77.6 Heparin Dosing Weight: 76  Vital Signs: Temp: 97.5 F (36.4 C) (04/01 0743) Temp Source: Oral (04/01 0743) BP: 100/59 (04/01 0743) Pulse Rate: 75 (04/01 0743)  Labs: Recent Labs    07/16/19 0358 07/16/19 0358 07/16/19 0549 07/17/19 0429 07/18/19 0314  HGB 11.4*   < >  --  11.7* 11.1*  HCT 34.0*  --   --  35.4* 34.0*  PLT 212  --   --  215 187  APTT >200*   < > 87* 73* 66*  LABPROT 16.5*  --   --   --   --   INR 1.3*  --   --   --   --   HEPARINUNFRC 1.38*   < > 1.00* 0.69 0.56  CREATININE 1.88*  --   --  1.78* 1.66*   < > = values in this interval not displayed.    Estimated Creatinine Clearance: 43.9 mL/min (A) (by C-G formula based on SCr of 1.66 mg/dL (H)).   Medical History: Past Medical History:  Diagnosis Date  . Abdominal aortic aneurysm ([place])   . AICD (automatic cardioverter/defibrillator) present    [employer]  . Anemia    iron deficiency anemia,takes iron pill daily  . Arthritis   . Coronary artery disease    AWMI 2001  . Diverticulosis   . GERD (gastroesophageal reflux disease)    takes Protonix daily  . History of colon polyps    benign  . History of migraine 40 yrs ago  . History of shingles   . Hyperlipidemia    takes Atorvastatin and Niacin daily  . Hypertension    takes Diovan,Spironolactone, and Carvedilol daily  . ICD (implantable cardioverter-defibrillator), single, in situ   . Ischemic cardiomyopathy   . Joint swelling   . LV dysfunction    takes Digoxin daily  . Myocardial infarction ([place]) 2001  . Pneumonia 40 yrs ago   hx of  . Shortness of breath dyspnea    with exertion.States he was a smoker for yr  . Status post coronary artery bypass grafting   . Type 2 diabetes mellitus (HCC)    takes  Glipizide,Metformin,Invokana,and Lantus daily.Average fasting blood sugar runs about 140    Assessment: 74 year old male with new onset afib recently started on apixaban 3/26, received 3 doses with last on 3/27pm.  Concern for low output and cardiogenic shock, apixaban has been switched to IV heparin in case procedures are needed. Will utilize aptt's to monitor and dose adjust for the next couple of days until heparin levels and aptt correlate.    APTT is therapeutic at 66, heparin level is also therapeutic but on higher end of goal at 0.56, on 850 units/hr- anticipate correlation possibly 4/1. Hgb 11.1, plt 187. No s/sx of bleeding or infusion issues.   Goal of Therapy:  Aptt goal 66-102s Heparin level 0.3-0.7 units/ml Monitor platelets by anticoagulation protocol: Yes   Plan:  Discontinue heparin infusion Transition to apixaban 5 mg BID   Thank you,   Antonietta Jewel, PharmD, BCCCP Clinical Pharmacist  Phone: [phone]  Please check AMION for all [facility] phone numbers After 10:00 PM, call [place] [phone]  07/18/2019 8:57 AM

## 2019-07-19 LAB — COOXEMETRY PANEL
Carboxyhemoglobin: 1.2 % (ref 0.5–1.5)
Carboxyhemoglobin: 1 % (ref 0.5–1.5)
Carboxyhemoglobin: 0.7 % (ref 0.5–1.5)
Methemoglobin: 1.4 % (ref 0.0–1.5)
Methemoglobin: 1.2 % (ref 0.0–1.5)
Methemoglobin: 0.8 % (ref 0.0–1.5)
O2 Saturation: 50.1 %
O2 Saturation: 47.5 %
O2 Saturation: 43.9 %
Total hemoglobin: 11.1 g/dL — ABNORMAL LOW (ref 12.0–16.0)
Total hemoglobin: 12.6 g/dL (ref 12.0–16.0)
Total hemoglobin: 11.8 g/dL — ABNORMAL LOW (ref 12.0–16.0)

## 2019-07-19 LAB — BASIC METABOLIC PANEL
Anion gap: 11 (ref 5–15)
BUN: 22 mg/dL (ref 8–23)
CO2: 24 mmol/L (ref 22–32)
Calcium: 9.4 mg/dL (ref 8.9–10.3)
Chloride: 103 mmol/L (ref 98–111)
Creatinine, Ser: 1.87 mg/dL — ABNORMAL HIGH (ref 0.61–1.24)
GFR calc Af Amer: 41 mL/min — ABNORMAL LOW (ref >60)
GFR calc non Af Amer: 35 mL/min — ABNORMAL LOW (ref >60)
Glucose, Bld: 144 mg/dL — ABNORMAL HIGH (ref 70–99)
Potassium: 4.3 mmol/L (ref 3.5–5.1)
Sodium: 138 mmol/L (ref 135–145)

## 2019-07-19 LAB — CBC
HCT: 33.2 % — ABNORMAL LOW (ref 39.0–52.0)
Hemoglobin: 11 g/dL — ABNORMAL LOW (ref 13.0–17.0)
MCH: 31.2 pg (ref 26.0–34.0)
MCHC: 33.1 g/dL (ref 30.0–36.0)
MCV: 94.1 fL (ref 80.0–100.0)
Platelets: 187 10*3/uL (ref 150–400)
RBC: 3.53 MIL/uL — ABNORMAL LOW (ref 4.22–5.81)
RDW: 15 % (ref 11.5–15.5)
WBC: 6.8 10*3/uL (ref 4.0–10.5)
nRBC: 0 % (ref 0.0–0.2)

## 2019-07-19 LAB — GLUCOSE, CAPILLARY
Glucose-Capillary: 111 mg/dL — ABNORMAL HIGH (ref 70–99)
Glucose-Capillary: 136 mg/dL — ABNORMAL HIGH (ref 70–99)
Glucose-Capillary: 136 mg/dL — ABNORMAL HIGH (ref 70–99)
Glucose-Capillary: 268 mg/dL — ABNORMAL HIGH (ref 70–99)

## 2019-07-19 NOTE — Progress Notes (Addendum)
Advanced Heart Failure Rounding Note   Subjective:    Co-ox low at 48% despite 0.25 of milrinone.   Maintaining NSR on tele. HR mid 70s.    Volume status good. CVP 6-7. Wt up 1 lb from day prior.    OOB sitting in chair. No cardiac complaints. Feels well despite low co-ox.  No dyspnea or fatigue.    Objective:   Weight Range:  Vital Signs:   Temp:  [97.4 F (36.3 C)-98.5 F (36.9 C)] 97.8 F (36.6 C) (04/02 0716) Pulse Rate:  [72-75] 73 (04/02 0716) Resp:  [15-25] 20 (04/02 0716) BP: (90-102)/(64-79) 101/71 (04/02 0716) SpO2:  [100 %] 100 % (04/02 0800) Weight:  [76.4 kg] 76.4 kg (04/02 0407) Last BM Date: (Pt. stated couple of days ago. Does not remember the exact d)  Weight change: Filed Weights   07/17/19 0410 07/18/19 0500 07/19/19 0407  Weight: 76.8 kg 76.1 kg 76.4 kg    Intake/Output:   Intake/Output Summary (Last 24 hours) at 07/19/2019 1212 Last data filed at 07/19/2019 0800 Gross per 24 hour  Intake 759.87 ml  Output 1910 ml  Net -1150.13 ml    PHYSICAL EXAM: CVP 6-7 General:  Well appearing WM sitting up in chair. No respiratory difficulty HEENT: normal Neck: supple. JVD 6-7 cm. Carotids 2+ bilat; no bruits. No lymphadenopathy or thyromegaly appreciated. Cor: PMI nondisplaced. Regular rate & rhythm. No rubs, gallops or murmurs. Lungs: clear Abdomen: soft, nontender, nondistended. No hepatosplenomegaly. No bruits or masses. Good bowel sounds. Extremities: no cyanosis, clubbing, rash, edema,  Neuro: alert & oriented x 3, cranial nerves grossly intact. moves all 4 extremities w/o difficulty. Affect pleasant.   Telemetry: NSR 70s personally reviewed.   Labs: Basic Metabolic Panel: Recent Labs  Lab 07/15/19 0444 07/15/19 0444 07/16/19 0358 07/16/19 0358 07/17/19 0429 07/18/19 0314 07/19/19 0340  NA 136  --  136  --  137 138 138  K 4.5  --  4.3  --  4.0 4.2 4.3  CL 106  --  104  --  104 103 103  CO2 17*  --  21*  --  22 23 24   GLUCOSE  153*  --  131*  --  69* 121* 144*  BUN 49*  --  40*  --  32* 29* 22  CREATININE 1.80*  --  1.88*  --  1.78* 1.66* 1.87*  CALCIUM 8.9   < > 9.1   < > 9.5 9.5 9.4   < > = values in this interval not displayed.    Liver Function Tests: Recent Labs  Lab 07/14/19 0142  AST 55*  ALT 58*  ALKPHOS 52  BILITOT 0.8  PROT 6.6  ALBUMIN 3.4*   No results for input(s): LIPASE, AMYLASE in the last 168 hours. No results for input(s): AMMONIA in the last 168 hours.  CBC: Recent Labs  Lab 07/15/19 0444 07/16/19 0358 07/17/19 0429 07/18/19 0314 07/19/19 0340  WBC 7.9 7.9 8.6 6.4 6.8  HGB 11.6* 11.4* 11.7* 11.1* 11.0*  HCT 34.7* 34.0* 35.4* 34.0* 33.2*  MCV 91.1 92.1 92.7 92.6 94.1  PLT 236 212 215 187 187    Cardiac Enzymes: No results for input(s): CKTOTAL, CKMB, CKMBINDEX, TROPONINI in the last 168 hours.  BNP: BNP (last 3 results) Recent Labs    06/19/19 1511 07/12/19 1323 07/14/19 0142  BNP 1,000.0* 1,976.9* 1,176.1*    ProBNP (last 3 results) No results for input(s): PROBNP in the last 8760 hours.  Other results:  Imaging: No results found.   Medications:     Scheduled Medications: . apixaban  5 mg Oral BID  . aspirin  81 mg Oral Daily  . atorvastatin  40 mg Oral Daily  . Chlorhexidine Gluconate Cloth  6 each Topical Daily  . digoxin  0.125 mg Oral Daily  . docusate sodium  100 mg Oral Daily  . insulin aspart  0-9 Units Subcutaneous TID WC  . insulin glargine  30 Units Subcutaneous QHS  . pantoprazole  40 mg Oral Daily  . polyethylene glycol  17 g Oral Daily  . sodium chloride flush  10-40 mL Intracatheter Q12H  . sodium chloride flush  3 mL Intravenous Once    Infusions: . sodium chloride    . amiodarone 30 mg/hr (07/19/19 0800)  . milrinone 0.375 mcg/kg/min (07/19/19 1053)    PRN Medications: Place/Maintain arterial line **AND** sodium chloride, acetaminophen, albuterol, nitroGLYCERIN, ondansetron (ZOFRAN) IV, sodium chloride flush,  sorbitol   Assessment/Plan:   1. Acute on chronic systolic HF -> cardiogenic shock - due to mixed iCM/NICM - baseline EF 30-35% due to iCM. EF this admit 10-15% due to tachy-induced CM - Initial co-ox 37% -> milrinone started 3/28.  - Todays CO-OX is 48%. Increase milrinone 0.375 mcg. Repeat co-ox this afternoon. - Volume status stable. CVP 6-7. Continue to hold diuretics. - Continue digoxin 0.125 mg daily. Watch level closely with CKD - no b-blocker with low output - No ARNI/ARB with AKI  - worsening CM likely due to AF (tachy-mediated CM) but cannot exclude worsening CAD. May need cath at some point - If unable to wean milrinone ? Advanced therapies.   2. Afib/flutler, Persistent - with RVR. Now in NSR on amiodarone.  - likely source of CM - Maintaining NSR. Continue amio drip while on inotropes.   - Continue Eliquis 5 mg bid   3. AKI - baseline creatinine ~1.2 - now 2.1 -> 1.8 -> 1.88->1.78->1.66->>1.87 - suspect cardiorenal.  - increase milrinone per above and continue to hold diuretics - continue to follow  4. CAD s/p CABG - s/p anterior MI in 2001 followed by CABG - no recent cath but denies angina - medical therapy for now in setting of AKI     Length of Stay: [place] PA-C  07/19/2019, 12:12 PM  Advanced Heart Failure Team Pager [phone] (M-F; 7a - 4p)  Please contact [facility] for night-coverage after hours (4p -7a ) and weekends on [email or web address]  Patient seen and examined with the above-signed Advanced Practice Provider and/or Housestaff. I personally reviewed laboratory data, imaging studies and relevant notes. I independently examined the patient and formulated the important aspects of the plan. I have edited the note to reflect any of my changes or salient points. I have personally discussed the plan with the patient and/or family.  Remains in NSR on IV amio. Remains on milrinone 0.25. Co-ox remains < 50% c/w low output. Renal function  worse. Volume status ok. Denies SOB, orthopnea or PND.   General:  Sitting in chair  No resp difficulty HEENT: normal Neck: supple. no JVD. Carotids 2+ bilat; no bruits. No lymphadenopathy or thryomegaly appreciated. Cor: PMI nondisplaced. Regular rate & rhythm. No s3 Lungs: clear Abdomen: soft, nontender, nondistended. No hepatosplenomegaly. No bruits or masses. Good bowel sounds. Extremities: no cyanosis, clubbing, rash, edema Neuro: alert & orientedx3, cranial nerves grossly intact. moves all 4 extremities w/o difficulty. Affect pleasant  Continues with low output despite restoration of  NSR. Will increase milrinone dose to 0.375 at leave at that level over the weekend. Continue digoxin. Add low-dose spiro. Watch K and renal function closely. If no improvement over next few days may have to consider mechanical support options.  Glori Bickers, MD  6:09 PM

## 2019-07-19 NOTE — Progress Notes (Signed)
CARDIAC REHAB PHASE I   PRE:  Rate/Rhythm: 70 SR    BP: sitting 93/54    SaO2: 96 RA  MODE:  Ambulation: 1200 ft   POST:  Rate/Rhythm: 88 SR    BP: sitting 119/85     SaO2: 100 RA  Pt feeling well. Able to walk without c/o.  Gave him walking guidelines for home and reviewed HF ed. Reminded pt to be aware of his sx as he sts "I feel good, I felt good when I got here!". He did CRPII previously and prefers to exercise on his own at home now. Encouraged more walking over the weekend. He can be independent. [place], ACSM 07/19/2019 9:50 AM

## 2019-07-19 NOTE — Plan of Care (Signed)
  Problem: Clinical Measurements: Goal: Ability to maintain clinical measurements within normal limits will improve Outcome: Progressing Goal: Will remain free from infection Outcome: Progressing Goal: Respiratory complications will improve Outcome: Progressing   Problem: Coping: Goal: Level of anxiety will decrease Outcome: Progressing   

## 2019-07-20 DIAGNOSIS — I5043 Acute on chronic combined systolic (congestive) and diastolic (congestive) heart failure: Secondary | ICD-10-CM

## 2019-07-20 LAB — BASIC METABOLIC PANEL
Anion gap: 11 (ref 5–15)
BUN: 19 mg/dL (ref 8–23)
CO2: 25 mmol/L (ref 22–32)
Calcium: 9.5 mg/dL (ref 8.9–10.3)
Chloride: 104 mmol/L (ref 98–111)
Creatinine, Ser: 1.5 mg/dL — ABNORMAL HIGH (ref 0.61–1.24)
GFR calc Af Amer: 54 mL/min — ABNORMAL LOW (ref >60)
GFR calc non Af Amer: 46 mL/min — ABNORMAL LOW (ref >60)
Glucose, Bld: 137 mg/dL — ABNORMAL HIGH (ref 70–99)
Potassium: 4.3 mmol/L (ref 3.5–5.1)
Sodium: 140 mmol/L (ref 135–145)

## 2019-07-20 LAB — CBC
HCT: 33.5 % — ABNORMAL LOW (ref 39.0–52.0)
Hemoglobin: 11.1 g/dL — ABNORMAL LOW (ref 13.0–17.0)
MCH: 31 pg (ref 26.0–34.0)
MCHC: 33.1 g/dL (ref 30.0–36.0)
MCV: 93.6 fL (ref 80.0–100.0)
Platelets: 192 10*3/uL (ref 150–400)
RBC: 3.58 MIL/uL — ABNORMAL LOW (ref 4.22–5.81)
RDW: 14.8 % (ref 11.5–15.5)
WBC: 7.4 10*3/uL (ref 4.0–10.5)
nRBC: 0 % (ref 0.0–0.2)

## 2019-07-20 LAB — COOXEMETRY PANEL
Carboxyhemoglobin: 1.2 % (ref 0.5–1.5)
Methemoglobin: 1.4 % (ref 0.0–1.5)
O2 Saturation: 57.1 %
Total hemoglobin: 11.4 g/dL — ABNORMAL LOW (ref 12.0–16.0)

## 2019-07-20 LAB — GLUCOSE, CAPILLARY
Glucose-Capillary: 64 mg/dL — ABNORMAL LOW (ref 70–99)
Glucose-Capillary: 119 mg/dL — ABNORMAL HIGH (ref 70–99)
Glucose-Capillary: 115 mg/dL — ABNORMAL HIGH (ref 70–99)
Glucose-Capillary: 244 mg/dL — ABNORMAL HIGH (ref 70–99)
Glucose-Capillary: 184 mg/dL — ABNORMAL HIGH (ref 70–99)

## 2019-07-20 MED ORDER — SORBITOL 70 % SOLN
30.0000 mL | Freq: Once | Status: AC
  Administered 2019-07-20: 30 mL via ORAL
  Filled 2019-07-20: qty 30

## 2019-07-20 MED ORDER — FUROSEMIDE 40 MG PO TABS: 40.0000 mg | ORAL_TABLET | Freq: Once | ORAL | Status: DC

## 2019-07-20 NOTE — Progress Notes (Signed)
Progress Note  Patient Name: Jordan Ross Date of Encounter: 07/20/2019  Primary Cardiologist: Sanda Klein, MD   Subjective   Was depressed last night when he heard that his hemodynamic parameters were worse, but in a better mood this morning.  Denies dyspnea, angina, palpitations or syncope.   Hemodynamics improved after increased dose of milrinone.  Last coox 57%.  Creatinine improved as well. Good urine output, net diuresis 3.2 L since admission.  Inpatient Medications    Scheduled Meds: . apixaban  5 mg Oral BID  . aspirin  81 mg Oral Daily  . atorvastatin  40 mg Oral Daily  . Chlorhexidine Gluconate Cloth  6 each Topical Daily  . digoxin  0.125 mg Oral Daily  . docusate sodium  100 mg Oral Daily  . insulin aspart  0-9 Units Subcutaneous TID WC  . insulin glargine  30 Units Subcutaneous QHS  . pantoprazole  40 mg Oral Daily  . polyethylene glycol  17 g Oral Daily  . sodium chloride flush  10-40 mL Intracatheter Q12H  . sodium chloride flush  3 mL Intravenous Once   Continuous Infusions: . sodium chloride    . amiodarone 30 mg/hr (07/20/19 0400)  . milrinone 0.375 mcg/kg/min (07/20/19 0400)   PRN Meds: Place/Maintain arterial line **AND** sodium chloride, acetaminophen, albuterol, nitroGLYCERIN, ondansetron (ZOFRAN) IV, sodium chloride flush, sorbitol   Vital Signs    Vitals:   07/19/19 2309 07/20/19 0332 07/20/19 0720 07/20/19 0745  BP: 115/71 99/69 104/66   Pulse: 80 77 70   Resp: 12 17    Temp: (!) 97.5 F (36.4 C) 97.9 F (36.6 C) 97.6 F (36.4 C)   TempSrc: Oral Oral Oral   SpO2: 100% 99%  100%  Weight:  76.4 kg    Height:        Intake/Output Summary (Last 24 hours) at 07/20/2019 1044 Last data filed at 07/20/2019 0745 Gross per 24 hour  Intake 1113.15 ml  Output 1550 ml  Net -436.85 ml   Last 3 Weights 07/20/2019 07/19/2019 07/18/2019  Weight (lbs) 168 lb 6.9 oz 168 lb 6.9 oz 167 lb 12.3 oz  Weight (kg) 76.4 kg 76.4 kg 76.1 kg      Telemetry      Sinus rhythm- Personally Reviewed  ECG    Atrial fibrillation with rapid ventricular response, nonspecific IVCD QRS 140 ms, sequelae of extensive old anterior infarction- Personally Reviewed  Physical Exam  Appears very comfortable sitting up in chair GEN: No acute distress.   Neck:  8 -9 cm JVD Cardiac: RRR, no murmurs, rubs, or gallops.  Respiratory: Clear to auscultation bilaterally. GI: Soft, nontender, non-distended  MS: No edema; No deformity. Neuro:  Nonfocal  Psych: Normal affect   Labs    High Sensitivity Troponin:   Recent Labs  Lab 07/12/19 1305 07/12/19 1523  TROPONINIHS 150* 156*      Chemistry Recent Labs  Lab 07/14/19 0142 07/15/19 0444 07/18/19 0314 07/19/19 0340 07/20/19 0345  NA 136   < > 138 138 140  K 4.9   < > 4.2 4.3 4.3  CL 104   < > 103 103 104  CO2 20*   < > 23 24 25   GLUCOSE 118*   < > 121* 144* 137*  BUN 53*   < > 29* 22 19  CREATININE 2.07*   < > 1.66* 1.87* 1.50*  CALCIUM 9.2   < > 9.5 9.4 9.5  PROT 6.6  --   --   --   --  ALBUMIN 3.4*  --   --   --   --   AST 55*  --   --   --   --   ALT 58*  --   --   --   --   ALKPHOS 52  --   --   --   --   BILITOT 0.8  --   --   --   --   GFRNONAA 31*   < > 41* 35* 46*  GFRAA 36*   < > 47* 41* 54*  ANIONGAP 12   < > 12 11 11    < > = values in this interval not displayed.     Hematology Recent Labs  Lab 07/18/19 0314 07/19/19 0340 07/20/19 0345  WBC 6.4 6.8 7.4  RBC 3.67* 3.53* 3.58*  HGB 11.1* 11.0* 11.1*  HCT 34.0* 33.2* 33.5*  MCV 92.6 94.1 93.6  MCH 30.2 31.2 31.0  MCHC 32.6 33.1 33.1  RDW 14.7 15.0 14.8  PLT 187 187 192    BNP Recent Labs  Lab 07/14/19 0142  BNP 1,176.1*     DDimer No results for input(s): DDIMER in the last 168 hours.   Radiology    No results found.  Cardiac Studies   07/13/2019 echocardiogram 1. Left ventricular ejection fraction, by estimation, is <20%. The left  ventricle has severely decreased function. The left ventricle  demonstrates  regional wall motion abnormalities (see scoring diagram/findings for  description). The left ventricular  internal cavity size was moderately dilated. There is mild left  ventricular hypertrophy. Left ventricular diastolic parameters are  indeterminate.  2. RV poorly visualized. Grossly appears enlarged with decreased systolic  function. . Right ventricular systolic function was not well visualized.  The right ventricular size is not well visualized. There is moderately  elevated pulmonary artery systolic  pressure.  3. Left atrial size was mildly dilated.  4. The MV vena contracta is 0.6 cm. . The mitral valve is abnormal.  Moderate to severe mitral valve regurgitation. No evidence of mitral  stenosis.  5. The aortic valve is tricuspid. Aortic valve regurgitation is not  visualized. Low flow low gradient moderate aortic stenosis.  6. Aortic dilatation noted. There is mild dilatation of the aortic root  and of the ascending aorta measuring 42 mm.  7. The inferior vena cava is dilated in size with <50% respiratory  variability, suggesting right atrial pressure of 15 mmHg.   Patient Profile     74 y.o. male with longstanding ischemic/nonischemic cardiomyopathy, with acute exacerbation due to superimposed tachycardia cardiomyopathy in the setting of persistent atrial fibrillation rapid ventricular response  Assessment & Plan    1. CHF: EF has decreased further to 10-15%.  Currently on milrinone, dose had to be increased last night due to very low mixed venous oxygen saturation.  Subjectively improved and marked improvement in coox up to 57% today.  Plan to continue milrinone infusion over the weekend and then attempt to wean early next week.  Good urine output and improved renal parameters.  Option for CRT-D upgrade if EF does not recover, although he does not have features that would necessarily predict excellent response to CRT (not a typical LBBB, QRS 140 ms).   Chronically on Entresto, currently on hold due to soft blood pressure.  Also on digoxin and recently started on low-dose spironolactone 2. AFib: Ending sinus rhythm on intravenous amiodarone.  Will transition to oral amiodarone when we stop his inotropes.  On Eliquis.  CHA2DS2-VASc  4 (age, CHF, CAD, history of hypertension). 3. AKI: Due to decreased cardiac output.  Peak creatinine was 2.1 gradually decreasing, 1.5 today (baseline 1.2). 4. CAD s/p CABG: Extensive anterior myocardial infarction in 2001.  Denies angina pectoris.  Reevaluate for ongoing areas of myocardial ischemia after he recovers from the acute illness. 5. ICD: Normal device function by remote download on March 10.  He was in normal sinus rhythm at that time.  Thoracic impedance was normal, but note that his OptiVol has shown broad swings over the last several years, without clinical evidence of heart failure.  It does not appear to be a reliable way to monitor for fluid overload.  For questions or updates, please contact [place] Please consult [email or web address] for contact info under        Signed, Sanda Klein, MD  07/20/2019, 10:44 AM

## 2019-07-20 NOTE — Progress Notes (Signed)
Advanced Heart Failure Rounding Note   Subjective:    Milrinone increased yesterday due to low coox.  Now at 0.375.  Coox improved from 48 to 57%.  He feels well.  Denies shortness of breath orthopnea or PND.  He is somewhat frustrated about his course.  He remains in sinus rhythm on p.o. amiodarone.  On Eliquis.  No bleeding.  CVP 6-7.  Check personally.   Objective:   Weight Range:  Vital Signs:   Temp:  [97.5 F (36.4 C)-97.9 F (36.6 C)] 97.7 F (36.5 C) (04/03 1101) Pulse Rate:  [70-80] 73 (04/03 1101) Resp:  [12-20] 17 (04/03 0332) BP: (99-115)/(66-71) 104/66 (04/03 0720) SpO2:  [99 %-100 %] 100 % (04/03 0745) Weight:  [76.4 kg] 76.4 kg (04/03 0332) Last BM Date: (Pt. states couple of days ago)  Weight change: Filed Weights   07/18/19 0500 07/19/19 0407 07/20/19 0332  Weight: 76.1 kg 76.4 kg 76.4 kg    Intake/Output:   Intake/Output Summary (Last 24 hours) at 07/20/2019 1459 Last data filed at 07/20/2019 1300 Gross per 24 hour  Intake 1335.35 ml  Output 950 ml  Net 385.35 ml    PHYSICAL EXAM: General:  Well appearing. No resp difficulty HEENT: normal Neck: supple.  JVP 6-7 carotids 2+ bilat; no bruits. No lymphadenopathy or thryomegaly appreciated. Cor: PMI nondisplaced. Regular rate & rhythm. No rubs, gallops or murmurs. Lungs: clear Abdomen: soft, nontender, nondistended. No hepatosplenomegaly. No bruits or masses. Good bowel sounds. Extremities: no cyanosis, clubbing, rash, edema Neuro: alert & orientedx3, cranial nerves grossly intact. moves all 4 extremities w/o difficulty. Affect pleasant   Telemetry: NSR 70s.  Personally reviewed.  Labs: Basic Metabolic Panel: Recent Labs  Lab 07/16/19 0358 07/16/19 0358 07/17/19 0429 07/17/19 0429 07/18/19 0314 07/19/19 0340 07/20/19 0345  NA 136  --  137  --  138 138 140  K 4.3  --  4.0  --  4.2 4.3 4.3  CL 104  --  104  --  103 103 104  CO2 21*  --  22  --  23 24 25   GLUCOSE 131*  --  69*  --   121* 144* 137*  BUN 40*  --  32*  --  29* 22 19  CREATININE 1.88*  --  1.78*  --  1.66* 1.87* 1.50*  CALCIUM 9.1   < > 9.5   < > 9.5 9.4 9.5   < > = values in this interval not displayed.    Liver Function Tests: Recent Labs  Lab 07/14/19 0142  AST 55*  ALT 58*  ALKPHOS 52  BILITOT 0.8  PROT 6.6  ALBUMIN 3.4*   No results for input(s): LIPASE, AMYLASE in the last 168 hours. No results for input(s): AMMONIA in the last 168 hours.  CBC: Recent Labs  Lab 07/16/19 0358 07/17/19 0429 07/18/19 0314 07/19/19 0340 07/20/19 0345  WBC 7.9 8.6 6.4 6.8 7.4  HGB 11.4* 11.7* 11.1* 11.0* 11.1*  HCT 34.0* 35.4* 34.0* 33.2* 33.5*  MCV 92.1 92.7 92.6 94.1 93.6  PLT 212 215 187 187 192    Cardiac Enzymes: No results for input(s): CKTOTAL, CKMB, CKMBINDEX, TROPONINI in the last 168 hours.  BNP: BNP (last 3 results) Recent Labs    06/19/19 1511 07/12/19 1323 07/14/19 0142  BNP 1,000.0* 1,976.9* 1,176.1*    ProBNP (last 3 results) No results for input(s): PROBNP in the last 8760 hours.    Other results:  Imaging: No results found.  Medications:     Scheduled Medications: . apixaban  5 mg Oral BID  . aspirin  81 mg Oral Daily  . atorvastatin  40 mg Oral Daily  . Chlorhexidine Gluconate Cloth  6 each Topical Daily  . digoxin  0.125 mg Oral Daily  . docusate sodium  100 mg Oral Daily  . insulin aspart  0-9 Units Subcutaneous TID WC  . insulin glargine  30 Units Subcutaneous QHS  . pantoprazole  40 mg Oral Daily  . polyethylene glycol  17 g Oral Daily  . sodium chloride flush  10-40 mL Intracatheter Q12H  . sodium chloride flush  3 mL Intravenous Once  . sorbitol  30 mL Oral Once    Infusions: . sodium chloride    . amiodarone 30 mg/hr (07/20/19 1300)  . milrinone 0.375 mcg/kg/min (07/20/19 1300)    PRN Medications: Place/Maintain arterial line **AND** sodium chloride, acetaminophen, albuterol, nitroGLYCERIN, ondansetron (ZOFRAN) IV, sodium chloride flush,  sorbitol   Assessment/Plan:   1. Acute on chronic systolic HF -> cardiogenic shock - due to mixed iCM/NICM - baseline EF 30-35% due to iCM. EF this admit 10-15% due to tachy-induced CM - Initial co-ox 37% -> milrinone started 3/28.  -Coox improved from 48% to 57% on milrinone 0.375.  We will continue for 1 more day and then try to wean again.   - Volume status stable. CVP 6-7. Continue to hold diuretics. - Continue digoxin 0.125 mg daily.  -We will add low-dose spironolactone. - no b-blocker with low output - No ARNI/ARB with AKI  - worsening CM likely due to AF (tachy-mediated CM) but cannot exclude worsening CAD. May need cath at some point - If unable to wean milrinone will need to consider advanced therapies.  2. Afib/flutler, Persistent - with RVR likely the source of his cardiomyopathy. - Now in NSR after cardioversion.   -Remains on IV amiodarone while on inotropes. - Continue Eliquis 5 mg bid.  No bleeding  3. AKI - baseline creatinine ~1.2 - now 2.1 -> 1.8 -> 1.88->1.78->1.66->>1.87 > 1.5 - suspect cardiorenal.  - Closely.  4. CAD s/p CABG - s/p anterior MI in 2001 followed by CABG - no recent cath but denies angina - medical therapy for now in setting of AKI    5.  Constipation -We will give sorbitol x1  Length of Stay: 8   Jaleeah Slight MD 07/20/2019, 2:59 PM  Advanced Heart Failure Team Pager [phone] (M-F; [place])  Please contact [facility] for night-coverage after hours (4p -7a ) and weekends on [email or web address]

## 2019-07-20 NOTE — Plan of Care (Signed)
  Problem: Clinical Measurements: Goal: Ability to maintain clinical measurements within normal limits will improve Outcome: Progressing   Problem: Clinical Measurements: Goal: Cardiovascular complication will be avoided Outcome: Progressing   Problem: Elimination: Goal: Will not experience complications related to bowel motility Outcome: Progressing   Problem: Pain Managment: Goal: General experience of comfort will improve Outcome: Progressing   Problem: Safety: Goal: Ability to remain free from injury will improve Outcome: Progressing   Problem: Activity: Goal: Capacity to carry out activities will improve Outcome: Progressing

## 2019-07-21 LAB — BASIC METABOLIC PANEL
Anion gap: 8 (ref 5–15)
BUN: 14 mg/dL (ref 8–23)
CO2: 26 mmol/L (ref 22–32)
Calcium: 9.3 mg/dL (ref 8.9–10.3)
Chloride: 106 mmol/L (ref 98–111)
Creatinine, Ser: 1.37 mg/dL — ABNORMAL HIGH (ref 0.61–1.24)
GFR calc Af Amer: 60 mL/min — ABNORMAL LOW (ref >60)
GFR calc non Af Amer: 52 mL/min — ABNORMAL LOW (ref >60)
Glucose, Bld: 132 mg/dL — ABNORMAL HIGH (ref 70–99)
Potassium: 4.2 mmol/L (ref 3.5–5.1)
Sodium: 140 mmol/L (ref 135–145)

## 2019-07-21 LAB — GLUCOSE, CAPILLARY
Glucose-Capillary: 72 mg/dL (ref 70–99)
Glucose-Capillary: 81 mg/dL (ref 70–99)
Glucose-Capillary: 283 mg/dL — ABNORMAL HIGH (ref 70–99)
Glucose-Capillary: 179 mg/dL — ABNORMAL HIGH (ref 70–99)

## 2019-07-21 LAB — COOXEMETRY PANEL
Carboxyhemoglobin: 1.3 % (ref 0.5–1.5)
Methemoglobin: 1.3 % (ref 0.0–1.5)
O2 Saturation: 63.8 %
Total hemoglobin: 11.4 g/dL — ABNORMAL LOW (ref 12.0–16.0)

## 2019-07-21 LAB — MAGNESIUM: Magnesium: 1.7 mg/dL (ref 1.7–2.4)

## 2019-07-21 NOTE — Progress Notes (Addendum)
Progress Note  Patient Name: Jordan Ross Date of Encounter: 07/21/2019  Primary Cardiologist: Sanda Klein, MD   Subjective   He has made further progress in the last 24 hours.  Now net diuresis 3.7 L since admission, although weight does not show any real changes. Walked around the unit yesterday without dyspnea, angina, palpitations or dizziness. Remains in sinus rhythm on intravenous amiodarone. Cholox panel shows additional improvement in cardiac index (mixed venous O2 saturation 64% today).  This is also reflected in improved renal function with creatinine now down to 1.37.   Inpatient Medications    Scheduled Meds: . apixaban  5 mg Oral BID  . aspirin  81 mg Oral Daily  . atorvastatin  40 mg Oral Daily  . Chlorhexidine Gluconate Cloth  6 each Topical Daily  . digoxin  0.125 mg Oral Daily  . docusate sodium  100 mg Oral Daily  . insulin aspart  0-9 Units Subcutaneous TID WC  . insulin glargine  30 Units Subcutaneous QHS  . pantoprazole  40 mg Oral Daily  . polyethylene glycol  17 g Oral Daily  . sodium chloride flush  10-40 mL Intracatheter Q12H  . sodium chloride flush  3 mL Intravenous Once   Continuous Infusions: . sodium chloride    . amiodarone 30 mg/hr (07/21/19 0259)  . milrinone 0.375 mcg/kg/min (07/21/19 0648)   PRN Meds: Place/Maintain arterial line **AND** sodium chloride, acetaminophen, albuterol, nitroGLYCERIN, ondansetron (ZOFRAN) IV, sodium chloride flush, sorbitol   Vital Signs    Vitals:   07/20/19 1917 07/20/19 2300 07/21/19 0000 07/21/19 0300  BP: 116/80 99/63  110/85  Pulse: 77 76  72  Resp: 15 18 15 19   Temp: 97.6 F (36.4 C) 98.5 F (36.9 C)  97.8 F (36.6 C)  TempSrc: Oral Oral  Oral  SpO2: 96% 99%  99%  Weight:    76 kg  Height:        Intake/Output Summary (Last 24 hours) at 07/21/2019 0849 Last data filed at 07/21/2019 0648 Gross per 24 hour  Intake 1302.54 ml  Output 1775 ml  Net -472.46 ml   Last 3 Weights 07/21/2019  07/20/2019 07/19/2019  Weight (lbs) 167 lb 8.8 oz 168 lb 6.9 oz 168 lb 6.9 oz  Weight (kg) 76 kg 76.4 kg 76.4 kg      Telemetry    Sinus rhythm rare PVCs- Personally Reviewed  ECG    No new tracing- Personally Reviewed  Physical Exam  Appears well, comfortable lying flat.  Has better color in his cheeks.  Warm extremities GEN: No acute distress.   Neck: No JVD Cardiac: RRR, 2/6 holosystolic murmur at apex and left lower sternal border, no diastolic murmurs, rubs, or gallops.  Respiratory: Clear to auscultation bilaterally. GI: Soft, nontender, non-distended  MS: No edema; No deformity. Neuro:  Nonfocal  Psych: Normal affect   Labs    High Sensitivity Troponin:   Recent Labs  Lab 07/12/19 1305 07/12/19 1523  TROPONINIHS 150* 156*      Chemistry Recent Labs  Lab 07/19/19 0340 07/20/19 0345 07/21/19 0351  NA 138 140 140  K 4.3 4.3 4.2  CL 103 104 106  CO2 24 25 26   GLUCOSE 144* 137* 132*  BUN 22 19 14   CREATININE 1.87* 1.50* 1.37*  CALCIUM 9.4 9.5 9.3  GFRNONAA 35* 46* 52*  GFRAA 41* 54* 60*  ANIONGAP 11 11 8      Hematology Recent Labs  Lab 07/18/19 0314 07/19/19 0340 07/20/19 0345  WBC 6.4 6.8 7.4  RBC 3.67* 3.53* 3.58*  HGB 11.1* 11.0* 11.1*  HCT 34.0* 33.2* 33.5*  MCV 92.6 94.1 93.6  MCH 30.2 31.2 31.0  MCHC 32.6 33.1 33.1  RDW 14.7 15.0 14.8  PLT 187 187 192    BNPNo results for input(s): BNP, PROBNP in the last 168 hours.   DDimer No results for input(s): DDIMER in the last 168 hours.   Radiology    No results found.  Cardiac Studies    07/13/2019 echocardiogram 1. Left ventricular ejection fraction, by estimation, is <20%. The left  ventricle has severely decreased function. The left ventricle demonstrates  regional wall motion abnormalities (see scoring diagram/findings for  description). The left ventricular  internal cavity size was moderately dilated. There is mild left  ventricular hypertrophy. Left ventricular diastolic  parameters are  indeterminate.  2. RV poorly visualized. Grossly appears enlarged with decreased systolic  function. . Right ventricular systolic function was not well visualized.  The right ventricular size is not well visualized. There is moderately  elevated pulmonary artery systolic  pressure.  3. Left atrial size was mildly dilated.  4. The MV vena contracta is 0.6 cm. . The mitral valve is abnormal.  Moderate to severe mitral valve regurgitation. No evidence of mitral  stenosis.  5. The aortic valve is tricuspid. Aortic valve regurgitation is not  visualized. Low flow low gradient moderate aortic stenosis.  6. Aortic dilatation noted. There is mild dilatation of the aortic root  and of the ascending aorta measuring 42 mm.  7. The inferior vena cava is dilated in size with <50% respiratory  variability, suggesting right atrial pressure of 15 mmHg.    Patient Profile     74 y.o. male with longstanding ischemic/nonischemic cardiomyopathy, with acute exacerbation due to superimposed tachycardia cardiomyopathy in the setting of persistent atrial fibrillation rapid ventricular response  Assessment & Plan    1. CHF: EF has decreased further to 10-15%.    Has improved with restoration of normal rhythm and intravenous milrinone.  Good urine output and improved renal parameters.  Option for CRT-D upgrade if EF does not recover, although he does not have features that would necessarily predict excellent response to CRT (not a typical LBBB, QRS 140 ms).    As renal function is improving will gradually reintroduce Entresto as weaning off milrinone.  Continue to hold beta-blockers for the time being.  While the leading diagnostic hypothesis for his exacerbation is atrial fibrillation related tachycardia cardiomyopathy, it is a good idea to assess for progression of CAD as an outpatient. 2. AFib:  Switch to oral amiodarone when he is off milrinone.  On Eliquis.  CHA2DS2-VASc 4 (age, CHF, CAD,  history of hypertension). 3. AKI:  Improving with improved cardiac output.  Approaching his baseline creatinine of 1.2. 4. CAD s/p CABG:  Denies angina pectoris with activity.  Had an extensive anterior myocardial infarction in 2001.   Reevaluate for ongoing areas of myocardial ischemia after he recovers from the acute illness. 5. ICD: Normal device function by remote download on March 10.  He was in normal sinus rhythm at that time.  Thoracic impedance was normal, but note that his OptiVol has shown broad swings over the last several years, without clinical evidence of heart failure.  It does not appear to be a reliable way to monitor for fluid overload.  Monitor for atrial fibrillation recurrence with his device and intervene promptly to avoid recurrent heart failure exacerbation. 6. MR: Reevaluate mitral  regurgitation severity by echo in a few weeks.  Suspect secondary mitral regurgitation due to further LV function duration.  If the MR remains moderate to severe or severe, he will require additional intervention such as CRT or MitraClip.  For questions or updates, please contact [place] Please consult [email or web address] for contact info under        Signed, Sanda Klein, MD  07/21/2019, 8:49 AM

## 2019-07-21 NOTE — Plan of Care (Signed)
  Problem: Clinical Measurements: Goal: Ability to maintain clinical measurements within normal limits will improve Outcome: Progressing Goal: Will remain free from infection Outcome: Progressing Goal: Respiratory complications will improve Outcome: Progressing Goal: Cardiovascular complication will be avoided Outcome: Progressing   Problem: Coping: Goal: Level of anxiety will decrease Outcome: Progressing   Problem: Elimination: Goal: Will not experience complications related to bowel motility Outcome: Progressing Goal: Will not experience complications related to urinary retention Outcome: Progressing   Problem: Pain Managment: Goal: General experience of comfort will improve Outcome: Progressing   Problem: Safety: Goal: Ability to remain free from injury will improve Outcome: Progressing   Problem: Skin Integrity: Goal: Risk for impaired skin integrity will decrease Outcome: Progressing   Problem: Education: Goal: Ability to demonstrate management of disease process will improve Outcome: Progressing Goal: Ability to verbalize understanding of medication therapies will improve Outcome: Progressing Goal: Individualized Educational Video(s) Outcome: Progressing   Problem: Activity: Goal: Capacity to carry out activities will improve Outcome: Progressing   Problem: Cardiac: Goal: Ability to achieve and maintain adequate cardiopulmonary perfusion will improve Outcome: Progressing

## 2019-07-21 NOTE — Progress Notes (Signed)
Advanced Heart Failure Rounding Note   Subjective:    Milrinone increased on 4/2 due to low coox.  Now at 0.375.  Coox improved from 48 to 57% -> 64%.    Feels better. No CP or SOB. CVP 7.  He remains in NSR on po amiodarone. On Eliquis. No bleeding  Objective:   Weight Range:  Vital Signs:   Temp:  [97.6 F (36.4 C)-98.5 F (36.9 C)] 97.8 F (36.6 C) (04/04 1218) Pulse Rate:  [67-82] 74 (04/04 1218) Resp:  [15-20] 20 (04/04 1234) BP: (99-119)/(55-85) 101/55 (04/04 1218) SpO2:  [96 %-100 %] 98 % (04/04 0856) Weight:  [76 kg] 76 kg (04/04 0300) Last BM Date: 07/20/19  Weight change: Filed Weights   07/19/19 0407 07/20/19 0332 07/21/19 0300  Weight: 76.4 kg 76.4 kg 76 kg    Intake/Output:   Intake/Output Summary (Last 24 hours) at 07/21/2019 1353 Last data filed at 07/21/2019 1220 Gross per 24 hour  Intake 981.2 ml  Output 2025 ml  Net -1043.8 ml    PHYSICAL EXAM: General:  Sitting in chair. No resp difficulty HEENT: normal Neck: supple. JVP 7. Carotids 2+ bilat; no bruits. No lymphadenopathy or thryomegaly appreciated. Cor: PMI nondisplaced. Regular rate & rhythm. No rubs, gallops or murmurs. Lungs: clear Abdomen: soft, nontender, nondistended. No hepatosplenomegaly. No bruits or masses. Good bowel sounds. Extremities: no cyanosis, clubbing, rash, edema Neuro: alert & orientedx3, cranial nerves grossly intact. moves all 4 extremities w/o difficulty. Affect pleasant    Telemetry: NSR 60-70s.  Personally reviewed.  Labs: Basic Metabolic Panel: Recent Labs  Lab 07/17/19 0429 07/17/19 0429 07/18/19 0314 07/18/19 0314 07/19/19 0340 07/20/19 0345 07/21/19 0351  NA 137  --  138  --  138 140 140  K 4.0  --  4.2  --  4.3 4.3 4.2  CL 104  --  103  --  103 104 106  CO2 22  --  23  --  24 25 26   GLUCOSE 69*  --  121*  --  144* 137* 132*  BUN 32*  --  29*  --  22 19 14   CREATININE 1.78*  --  1.66*  --  1.87* 1.50* 1.37*  CALCIUM 9.5   < > 9.5   < > 9.4 9.5  9.3  MG  --   --   --   --   --   --  1.7   < > = values in this interval not displayed.    Liver Function Tests: No results for input(s): AST, ALT, ALKPHOS, BILITOT, PROT, ALBUMIN in the last 168 hours. No results for input(s): LIPASE, AMYLASE in the last 168 hours. No results for input(s): AMMONIA in the last 168 hours.  CBC: Recent Labs  Lab 07/16/19 0358 07/17/19 0429 07/18/19 0314 07/19/19 0340 07/20/19 0345  WBC 7.9 8.6 6.4 6.8 7.4  HGB 11.4* 11.7* 11.1* 11.0* 11.1*  HCT 34.0* 35.4* 34.0* 33.2* 33.5*  MCV 92.1 92.7 92.6 94.1 93.6  PLT 212 215 187 187 192    Cardiac Enzymes: No results for input(s): CKTOTAL, CKMB, CKMBINDEX, TROPONINI in the last 168 hours.  BNP: BNP (last 3 results) Recent Labs    06/19/19 1511 07/12/19 1323 07/14/19 0142  BNP 1,000.0* 1,976.9* 1,176.1*    ProBNP (last 3 results) No results for input(s): PROBNP in the last 8760 hours.    Other results:  Imaging: No results found.   Medications:     Scheduled Medications: . apixaban  5 mg Oral BID  . aspirin  81 mg Oral Daily  . atorvastatin  40 mg Oral Daily  . Chlorhexidine Gluconate Cloth  6 each Topical Daily  . digoxin  0.125 mg Oral Daily  . docusate sodium  100 mg Oral Daily  . insulin aspart  0-9 Units Subcutaneous TID WC  . insulin glargine  30 Units Subcutaneous QHS  . pantoprazole  40 mg Oral Daily  . polyethylene glycol  17 g Oral Daily  . sodium chloride flush  10-40 mL Intracatheter Q12H    Infusions: . sodium chloride    . amiodarone 30 mg/hr (07/21/19 0905)  . milrinone 0.375 mcg/kg/min (07/21/19 0905)    PRN Medications: Place/Maintain arterial line **AND** sodium chloride, acetaminophen, albuterol, nitroGLYCERIN, ondansetron (ZOFRAN) IV, sodium chloride flush, sorbitol   Assessment/Plan:   1. Acute on chronic systolic HF -> cardiogenic shock - due to mixed iCM/NICM - baseline EF 30-35% due to iCM. EF this admit 10-15% due to tachy-induced CM -  Initial co-ox 37% -> milrinone started 3/28.  -Coox improved from 48% -> 57% -> 64% on milrinone 0.375.  Will start milrinone wean tonight and follow CVP and co-ox closely.  - Volume status stable. CVP 7. Continue to hold diuretics. - Continue digoxin 0.125 mg daily.  - Continue low-dose spironolactone. - Add losartan 12.5 - no b-blocker with low output - worsening CM likely due to AF (tachy-mediated CM) but cannot exclude worsening CAD. If fails milrinone wean will do cath prior to d/c - If unable to wean milrinone will need to consider advanced therapies.  2. Afib/flutler, Persistent - with RVR likely the source of his cardiomyopathy. - Now in NSR after cardioversion.   - Remains on IV amiodarone while on inotropes. Will continue. Discussed dosing with PharmD personally. - Continue Eliquis 5 mg bid.  No bleeding  3. AKI - baseline creatinine ~1.2 - now 2.1 -> 1.8 -> 1.88->1.78->1.66->>1.87 > 1.5 _ 1.37 - suspect cardiorenal.  - Continue to follow closely  4. CAD s/p CABG - s/p anterior MI in 2001 followed by CABG - no recent cath but denies angina - medical therapy for now in setting of AKI   - as above, If fails milrinone wean will do cath prior to d/c  5.  Constipation - resolved with sorbitol  Length of Stay: 9   Chanze Teagle MD 07/21/2019, 1:53 PM  Advanced Heart Failure Team Pager [phone] (M-F; [place])  Please contact [facility] for night-coverage after hours (4p -7a ) and weekends on [email or web address]

## 2019-07-22 DIAGNOSIS — Z95810 Presence of automatic (implantable) cardiac defibrillator: Secondary | ICD-10-CM

## 2019-07-22 DIAGNOSIS — I2510 Atherosclerotic heart disease of native coronary artery without angina pectoris: Secondary | ICD-10-CM

## 2019-07-22 LAB — GLUCOSE, CAPILLARY
Glucose-Capillary: 71 mg/dL (ref 70–99)
Glucose-Capillary: 117 mg/dL — ABNORMAL HIGH (ref 70–99)
Glucose-Capillary: 274 mg/dL — ABNORMAL HIGH (ref 70–99)
Glucose-Capillary: 134 mg/dL — ABNORMAL HIGH (ref 70–99)

## 2019-07-22 LAB — BASIC METABOLIC PANEL
Anion gap: 8 (ref 5–15)
BUN: 12 mg/dL (ref 8–23)
CO2: 23 mmol/L (ref 22–32)
Calcium: 8.6 mg/dL — ABNORMAL LOW (ref 8.9–10.3)
Chloride: 108 mmol/L (ref 98–111)
Creatinine, Ser: 1.16 mg/dL (ref 0.61–1.24)
GFR calc Af Amer: >60 mL/min (ref >60)
GFR calc non Af Amer: >60 mL/min (ref >60)
Glucose, Bld: 141 mg/dL — ABNORMAL HIGH (ref 70–99)
Potassium: 4.1 mmol/L (ref 3.5–5.1)
Sodium: 139 mmol/L (ref 135–145)

## 2019-07-22 LAB — COOXEMETRY PANEL
Carboxyhemoglobin: 0.9 % (ref 0.5–1.5)
Methemoglobin: 0.8 % (ref 0.0–1.5)
O2 Saturation: 60 %
Total hemoglobin: 11.4 g/dL — ABNORMAL LOW (ref 12.0–16.0)

## 2019-07-22 LAB — DIGOXIN LEVEL: Digoxin Level: 0.7 ng/mL — ABNORMAL LOW (ref 0.8–2.0)

## 2019-07-22 MED ORDER — DIGOXIN 125 MCG PO TABS
0.0625 mg | ORAL_TABLET | Freq: Every day | ORAL | Status: DC
  Administered 2019-07-23 – 2019-07-29 (×7): 0.0625 mg via ORAL
  Filled 2019-07-22 (×7): qty 1

## 2019-07-22 MED ORDER — LOSARTAN POTASSIUM 25 MG PO TABS
12.5000 mg | ORAL_TABLET | Freq: Every day | ORAL | Status: DC
  Administered 2019-07-22 – 2019-07-27 (×6): 12.5 mg via ORAL
  Filled 2019-07-22 (×6): qty 1

## 2019-07-22 MED ORDER — SPIRONOLACTONE 12.5 MG HALF TABLET
12.5000 mg | ORAL_TABLET | Freq: Every day | ORAL | Status: DC
  Administered 2019-07-22 – 2019-07-24 (×3): 12.5 mg via ORAL
  Filled 2019-07-22 (×3): qty 1

## 2019-07-22 NOTE — Plan of Care (Signed)
  Problem: Clinical Measurements: Goal: Ability to maintain clinical measurements within normal limits will improve Outcome: Progressing Goal: Will remain free from infection Outcome: Progressing Goal: Respiratory complications will improve Outcome: Progressing Goal: Cardiovascular complication will be avoided Outcome: Progressing   

## 2019-07-22 NOTE — Progress Notes (Signed)
Came to ambulate however pt asleep. Awoke but sts he would prefer to walk later this afternoon. Sts he walked independently over the weekend. Will f/u tomorrow. Sts he does not have any questions right now. [place], ACSM 2:22 PM 07/22/2019

## 2019-07-22 NOTE — Progress Notes (Addendum)
Advanced Heart Failure Rounding Note   Subjective:    Milrinone increased on 4/2 due to low co-ox (48%).  Now at 0.375.  Co-ox overall improved but slight drop in last 24 hrs from 64>>60% today.   Wt stable/ unchanged at 167 lb. CVP 8.   Sitting up in bed. No complaints. Feels "fine". Denies dyspnea.   Maintaing NSR on amiodarone.    Objective:   Weight Range:  Vital Signs:   Temp:  [97.6 F (36.4 C)-98.6 F (37 C)] 98.6 F (37 C) (04/05 0724) Pulse Rate:  [67-76] 76 (04/05 0724) Resp:  [17-23] 18 (04/05 0724) BP: (101-134)/(55-94) 134/94 (04/05 0724) SpO2:  [96 %-100 %] 100 % (04/05 0724) Weight:  [76.2 kg] 76.2 kg (04/05 0310) Last BM Date: 07/20/19  Weight change: Filed Weights   07/20/19 0332 07/21/19 0300 07/22/19 0310  Weight: 76.4 kg 76 kg 76.2 kg    Intake/Output:   Intake/Output Summary (Last 24 hours) at 07/22/2019 0821 Last data filed at 07/22/2019 0724 Gross per 24 hour  Intake 967.84 ml  Output 1750 ml  Net -782.16 ml     PHYSICAL EXAM: CVP 8 General:  Well appearing WM. No respiratory difficulty HEENT: normal Neck: supple. no JVD. Carotids 2+ bilat; no bruits. No lymphadenopathy or thyromegaly appreciated. Cor: PMI nondisplaced. Regular rate & rhythm. No rubs, gallops or murmurs. Lungs: clear Abdomen: soft, nontender, nondistended. No hepatosplenomegaly. No bruits or masses. Good bowel sounds. Extremities: no cyanosis, clubbing, rash, edema + RUE PICC Neuro: alert & oriented x 3, cranial nerves grossly intact. moves all 4 extremities w/o difficulty. Affect pleasant.    Telemetry: NSR 60-70s.  Personally reviewed.  Labs: Basic Metabolic Panel: Recent Labs  Lab 07/18/19 0314 07/18/19 0314 07/19/19 0340 07/19/19 0340 07/20/19 0345 07/21/19 0351 07/22/19 0425  NA 138  --  138  --  140 140 139  K 4.2  --  4.3  --  4.3 4.2 4.1  CL 103  --  103  --  104 106 108  CO2 23  --  24  --  25 26 23   GLUCOSE 121*  --  144*  --  137* 132* 141*   BUN 29*  --  22  --  19 14 12   CREATININE 1.66*  --  1.87*  --  1.50* 1.37* 1.16  CALCIUM 9.5   < > 9.4   < > 9.5 9.3 8.6*  MG  --   --   --   --   --  1.7  --    < > = values in this interval not displayed.    Liver Function Tests: No results for input(s): AST, ALT, ALKPHOS, BILITOT, PROT, ALBUMIN in the last 168 hours. No results for input(s): LIPASE, AMYLASE in the last 168 hours. No results for input(s): AMMONIA in the last 168 hours.  CBC: Recent Labs  Lab 07/16/19 0358 07/17/19 0429 07/18/19 0314 07/19/19 0340 07/20/19 0345  WBC 7.9 8.6 6.4 6.8 7.4  HGB 11.4* 11.7* 11.1* 11.0* 11.1*  HCT 34.0* 35.4* 34.0* 33.2* 33.5*  MCV 92.1 92.7 92.6 94.1 93.6  PLT 212 215 187 187 192    Cardiac Enzymes: No results for input(s): CKTOTAL, CKMB, CKMBINDEX, TROPONINI in the last 168 hours.  BNP: BNP (last 3 results) Recent Labs    06/19/19 1511 07/12/19 1323 07/14/19 0142  BNP 1,000.0* 1,976.9* 1,176.1*    ProBNP (last 3 results) No results for input(s): PROBNP in the last 8760 hours.  Other results:  Imaging: No results found.   Medications:     Scheduled Medications: . apixaban  5 mg Oral BID  . aspirin  81 mg Oral Daily  . atorvastatin  40 mg Oral Daily  . Chlorhexidine Gluconate Cloth  6 each Topical Daily  . digoxin  0.125 mg Oral Daily  . docusate sodium  100 mg Oral Daily  . insulin aspart  0-9 Units Subcutaneous TID WC  . insulin glargine  30 Units Subcutaneous QHS  . pantoprazole  40 mg Oral Daily  . polyethylene glycol  17 g Oral Daily  . sodium chloride flush  10-40 mL Intracatheter Q12H    Infusions: . sodium chloride    . amiodarone 30 mg/hr (07/22/19 0724)  . milrinone 0.375 mcg/kg/min (07/22/19 0724)    PRN Medications: Place/Maintain arterial line **AND** sodium chloride, acetaminophen, albuterol, nitroGLYCERIN, ondansetron (ZOFRAN) IV, sodium chloride flush, sorbitol   Assessment/Plan:   1. Acute on chronic systolic HF ->  cardiogenic shock - due to mixed iCM/NICM - baseline EF 30-35% due to iCM. EF this admit 10-15% due to tachy-induced CM - Initial co-ox 37% -> milrinone started 3/28.  -Co-ox improved from 48% -> 57% -> 64% on milrinone 0.375. Slight drop today at 60%.  - Will try to wean milrinone. Reduce to 0.25 mcg. Follow co-ox  - Volume status stable. CVP 8. Continue to hold diuretics. - Continue digoxin 0.125 mg daily. Dig level ok 4/5 - AKI resolved. SCr normalized. K 4.1. BP stable.  - Add low-dose spironolactone 12.5 mg daily  - Add losartan 12.5 mg daily . If BP tolerates, eventual transition to Entersto  - no b-blocker with low output - worsening CM likely due to AF (tachy-mediated CM) but cannot exclude worsening CAD. If fails milrinone wean will do cath prior to d/c - If unable to wean milrinone will need to consider advanced therapies.  2. Afib/flutler, Persistent - with RVR likely the source of his cardiomyopathy. - Now in NSR after cardioversion.   - Remains on IV amiodarone while on inotropes. Will continue. Discussed dosing with PharmD personally. - Continue Eliquis 5 mg bid.  No bleeding  3. AKI - baseline creatinine ~1.2 - trend 2.1 -> 1.8 -> 1.88->1.78->1.66->>1.87 > 1.5 >1.37> 1.16 - suspect cardiorenal.  - Continue to follow closely  4. CAD s/p CABG - s/p anterior MI in 2001 followed by CABG - no recent cath but denies angina - medical therapy for now in setting of AKI   - as above, If fails milrinone wean will do cath prior to d/c  5.  Constipation - resolved with sorbitol  Length of Stay: [place] PA-C 07/22/2019, 8:21 AM  Advanced Heart Failure Team Pager [phone] (M-F; 7a - 4p)  Please contact [facility] for night-coverage after hours (4p -7a ) and weekends on [email or web address]  Patient seen and examined with the above-signed Advanced Practice Provider and/or Housestaff. I personally reviewed laboratory data, imaging studies and relevant notes. I  independently examined the patient and formulated the important aspects of the plan. I have edited the note to reflect any of my changes or salient points. I have personally discussed the plan with the patient and/or family.  Remains on milrinone 0.375. Co-ox 60% this am. CVP  8. Weight stable. Feels ok. Walking halls. No CP, SOR, orthopnea or PND. Remains in NSR on IV amio. No bleeding on eliquis. Constipation resolved on sorbitol.  General:  Sitting in chair  No resp difficulty  HEENT: normal Neck: supple. no JVD. Carotids 2+ bilat; no bruits. No lymphadenopathy or thryomegaly appreciated. Cor: PMI nondisplaced. Regular rate & rhythm. No rubs, gallops or murmurs. Lungs: clear Abdomen: soft, nontender, nondistended. No hepatosplenomegaly. No bruits or masses. Good bowel sounds. Extremities: no cyanosis, clubbing, rash, edema Neuro: alert & orientedx3, cranial nerves grossly intact. moves all 4 extremities w/o difficulty. Affect pleasant  Remains quite tenuous. Co-ox ok and renal function has normalized on higher dose milrinone. Will attempt to wean milrinone over the next week. If fails may need to consider advanced therapies.   Glori Bickers, MD  2:31 PM

## 2019-07-23 LAB — BASIC METABOLIC PANEL
Anion gap: 10 (ref 5–15)
BUN: 13 mg/dL (ref 8–23)
CO2: 25 mmol/L (ref 22–32)
Calcium: 9.1 mg/dL (ref 8.9–10.3)
Chloride: 106 mmol/L (ref 98–111)
Creatinine, Ser: 1.23 mg/dL (ref 0.61–1.24)
GFR calc Af Amer: >60 mL/min (ref >60)
GFR calc non Af Amer: 59 mL/min — ABNORMAL LOW (ref >60)
Glucose, Bld: 72 mg/dL (ref 70–99)
Potassium: 3.9 mmol/L (ref 3.5–5.1)
Sodium: 141 mmol/L (ref 135–145)

## 2019-07-23 LAB — GLUCOSE, CAPILLARY
Glucose-Capillary: 55 mg/dL — ABNORMAL LOW (ref 70–99)
Glucose-Capillary: 97 mg/dL (ref 70–99)
Glucose-Capillary: 147 mg/dL — ABNORMAL HIGH (ref 70–99)
Glucose-Capillary: 186 mg/dL — ABNORMAL HIGH (ref 70–99)
Glucose-Capillary: 163 mg/dL — ABNORMAL HIGH (ref 70–99)

## 2019-07-23 LAB — COOXEMETRY PANEL
Carboxyhemoglobin: 1.3 % (ref 0.5–1.5)
Methemoglobin: 1.3 % (ref 0.0–1.5)
O2 Saturation: 60.1 %
Total hemoglobin: 12.2 g/dL (ref 12.0–16.0)

## 2019-07-23 LAB — MAGNESIUM: Magnesium: 1.7 mg/dL (ref 1.7–2.4)

## 2019-07-23 MED ORDER — INSULIN GLARGINE 100 UNIT/ML ~~LOC~~ SOLN
25.0000 [IU] | Freq: Every day | SUBCUTANEOUS | Status: DC
  Administered 2019-07-23 – 2019-07-27 (×5): 25 [IU] via SUBCUTANEOUS
  Administered 2019-07-28: 12 [IU] via SUBCUTANEOUS
  Filled 2019-07-23 (×7): qty 0.25

## 2019-07-23 NOTE — Progress Notes (Addendum)
Advanced Heart Failure Rounding Note   Subjective:    Co-ox stable w/ milrinone wean. 60% today on 0.25 mcg.   Volume status stable. CVP 5.   Maintaing NSR on amiodarone.   Tolerating new addition of spiro and losartan. BP stable. SCr/K stable.   No complaints this morning. OOB and sitting in chair. Feels well. Just finished breakfast. Appetite is good.    Objective:   Weight Range:  Vital Signs:   Temp:  [97.6 F (36.4 C)-98.4 F (36.9 C)] 98 F (36.7 C) (04/06 0736) Pulse Rate:  [62-71] 68 (04/06 0736) Resp:  [13-20] 14 (04/06 0331) BP: (89-111)/(59-81) 106/59 (04/06 0736) SpO2:  [96 %-100 %] 98 % (04/06 0331) Weight:  [73.3 kg] 73.3 kg (04/06 0331) Last BM Date: 07/22/19  Weight change: Filed Weights   07/21/19 0300 07/22/19 0310 07/23/19 0331  Weight: 76 kg 76.2 kg 73.3 kg    Intake/Output:   Intake/Output Summary (Last 24 hours) at 07/23/2019 0830 Last data filed at 07/23/2019 0737 Gross per 24 hour  Intake 1039.43 ml  Output 2150 ml  Net -1110.57 ml     PHYSICAL EXAM: CVP 5 General:  Well appearing WM sitting up in chair. No respiratory difficulty HEENT: normal Neck: supple. no JVD. Carotids 2+ bilat; no bruits. No lymphadenopathy or thyromegaly appreciated. Cor: PMI nondisplaced. Regular rate & rhythm. No rubs, gallops or murmurs. Lungs: clear. No wheezing  Abdomen: soft, nontender, nondistended. No hepatosplenomegaly. No bruits or masses. Good bowel sounds. Extremities: no cyanosis, clubbing, rash, edema + RUE PICC Neuro: alert & oriented x 3, cranial nerves grossly intact. moves all 4 extremities w/o difficulty. Affect pleasant.   Telemetry: NSR 60-70s.  Personally reviewed.  Labs: Basic Metabolic Panel: Recent Labs  Lab 07/19/19 0340 07/19/19 0340 07/20/19 0345 07/20/19 0345 07/21/19 0351 07/22/19 0425 07/23/19 0500  NA 138  --  140  --  140 139 141  K 4.3  --  4.3  --  4.2 4.1 3.9  CL 103  --  104  --  106 108 106  CO2 24  --  25   --  26 23 25   GLUCOSE 144*  --  137*  --  132* 141* 72  BUN 22  --  19  --  14 12 13   CREATININE 1.87*  --  1.50*  --  1.37* 1.16 1.23  CALCIUM 9.4   < > 9.5   < > 9.3 8.6* 9.1  MG  --   --   --   --  1.7  --  1.7   < > = values in this interval not displayed.    Liver Function Tests: No results for input(s): AST, ALT, ALKPHOS, BILITOT, PROT, ALBUMIN in the last 168 hours. No results for input(s): LIPASE, AMYLASE in the last 168 hours. No results for input(s): AMMONIA in the last 168 hours.  CBC: Recent Labs  Lab 07/17/19 0429 07/18/19 0314 07/19/19 0340 07/20/19 0345  WBC 8.6 6.4 6.8 7.4  HGB 11.7* 11.1* 11.0* 11.1*  HCT 35.4* 34.0* 33.2* 33.5*  MCV 92.7 92.6 94.1 93.6  PLT 215 187 187 192    Cardiac Enzymes: No results for input(s): CKTOTAL, CKMB, CKMBINDEX, TROPONINI in the last 168 hours.  BNP: BNP (last 3 results) Recent Labs    06/19/19 1511 07/12/19 1323 07/14/19 0142  BNP 1,000.0* 1,976.9* 1,176.1*    ProBNP (last 3 results) No results for input(s): PROBNP in the last 8760 hours.    Other  results:  Imaging: No results found.   Medications:     Scheduled Medications: . apixaban  5 mg Oral BID  . aspirin  81 mg Oral Daily  . atorvastatin  40 mg Oral Daily  . Chlorhexidine Gluconate Cloth  6 each Topical Daily  . digoxin  0.0625 mg Oral Daily  . docusate sodium  100 mg Oral Daily  . insulin aspart  0-9 Units Subcutaneous TID WC  . insulin glargine  30 Units Subcutaneous QHS  . losartan  12.5 mg Oral Daily  . pantoprazole  40 mg Oral Daily  . polyethylene glycol  17 g Oral Daily  . sodium chloride flush  10-40 mL Intracatheter Q12H  . spironolactone  12.5 mg Oral Daily    Infusions: . sodium chloride    . amiodarone 30 mg/hr (07/22/19 1646)  . milrinone 0.25 mcg/kg/min (07/22/19 1646)    PRN Medications: Place/Maintain arterial line **AND** sodium chloride, acetaminophen, albuterol, nitroGLYCERIN, ondansetron (ZOFRAN) IV, sodium  chloride flush, sorbitol   Assessment/Plan:   1. Acute on chronic systolic HF -> cardiogenic shock - due to mixed iCM/NICM - baseline EF 30-35% due to iCM. EF this admit 10-15% due to tachy-induced CM - Initial co-ox 37% -> milrinone started 3/28 and titrated up to 0.375. - Co-ox stable today w/ milrinone wean at 60% on 0.25 mcg.   - Will continue wean milrinone further. Reduce to 0.125 mcg. Follow co-ox  - Volume status stable. CVP 5. Continue to hold diuretics.  - Continue digoxin 0.125 mg daily. Dig level ok 4/5 - Continue spiro 12.5 mg daily  - Copntinue losartan 12.5 mg daily . BP too soft for Entresto  - no b-blocker with low output - worsening CM likely due to AF (tachy-mediated CM) but cannot exclude worsening CAD. If fails milrinone wean will do cath prior to d/c - If unable to wean milrinone will need to consider advanced therapies.  2. Afib/flutler, Persistent - with RVR likely the source of his cardiomyopathy. - Now in NSR after cardioversion.   - Remains on IV amiodarone while on inotropes. Will continue. Discussed dosing with PharmD personally. - Continue Eliquis 5 mg bid.  No bleeding  3. AKI - baseline creatinine ~1.2 - scr peaked to 2.1, suspect cardiorenal.  - Scr improved and now  normal, 1.23 today  - Continue to follow closely  4. CAD s/p CABG - s/p anterior MI in 2001 followed by CABG - no recent cath but denies angina - medical therapy for now in setting of AKI   - as above, If fails milrinone wean will do cath prior to d/c  5.  Constipation - resolved with sorbitol  Length of Stay: [place] PA-C 07/23/2019, 8:30 AM  Advanced Heart Failure Team Pager [phone] (M-F; [place])  Please contact [facility] for night-coverage after hours (4p -7a ) and weekends on [email or web address]  Patient seen and examined with the above-signed Advanced Practice Provider and/or Housestaff. I personally reviewed laboratory data, imaging studies and  relevant notes. I independently examined the patient and formulated the important aspects of the plan. I have edited the note to reflect any of my changes or salient points. I have personally discussed the plan with the patient and/or family.  Remains on milrinone 0.25 (Decreased from 0.375 yesterday). Co-ox stable at 60%. Volume status looks good. Tolelrating spiro and losartan. Remains in NSR. On eliquis. No bleeding  General:  Well appearing. No resp difficulty HEENT: normal Neck: supple. no  JVD. Carotids 2+ bilat; no bruits. No lymphadenopathy or thryomegaly appreciated. Cor: PMI nondisplaced. Regular rate & rhythm. No rubs, gallops or murmurs. Lungs: clear Abdomen: soft, nontender, nondistended. No hepatosplenomegaly. No bruits or masses. Good bowel sounds. Extremities: no cyanosis, clubbing, rash, edema Neuro: alert & orientedx3, cranial nerves grossly intact. moves all 4 extremities w/o difficulty. Affect pleasant  Agree with plan to decrease milrinone to 0.25 and follow co-ox closely. Renal function looks good. Volume status ok. Continue IV amio while on inotropes. Discussed dosing with PharmD personally.  Glori Bickers, MD  9:08 AM

## 2019-07-23 NOTE — Progress Notes (Signed)
CARDIAC REHAB PHASE I   PRE:  Rate/Rhythm: 68 SR    BP: sitting 123/67    SaO2: 98 RA  MODE:  Ambulation: 800? Ft (walked outside)  POST:  Rate/Rhythm: 88 SR    BP: sitting 124/78     SaO2: 100 RA  Pt feeling well. Able to walk independently out ER exit and down sidewalk. No c/o, feels well. Happy to be outside. Return to room for bathroom. VSS. Encouraged more walking on unit. [place], ACSM 07/23/2019 2:23 PM

## 2019-07-23 NOTE — Plan of Care (Signed)
  Problem: Clinical Measurements: Goal: Ability to maintain clinical measurements within normal limits will improve Outcome: Progressing Goal: Will remain free from infection Outcome: Progressing Goal: Respiratory complications will improve Outcome: Progressing Goal: Cardiovascular complication will be avoided Outcome: Progressing   Problem: Coping: Goal: Level of anxiety will decrease Outcome: Progressing   

## 2019-07-23 NOTE — Progress Notes (Signed)
Inpatient Diabetes Program Recommendations  AACE/ADA: New Consensus Statement on Inpatient Glycemic Control (2015)  Target Ranges:  Prepandial:   less than 140 mg/dL      Peak postprandial:   less than 180 mg/dL (1-2 hours)      Critically ill patients:  140 - 180 mg/dL   Lab Results  Component Value Date   GLUCAP 97 07/23/2019   HGBA1C 8.6 (H) 05/19/2017    Review of Glycemic Control Results for Jordan Ross, Jordan Ross (MRN [id number]) as of 07/23/2019 09:56  Ref. Range 07/22/2019 16:42 07/22/2019 21:15 07/23/2019 06:13 07/23/2019 06:41  Glucose-Capillary Latest Ref Range: 70 - 99 mg/dL 274 (H) 134 (H) 55 (L) 97   Diabetes history: DM2 Outpatient Diabetes medications: Lantus 30 units + Glucotrol 10 mg bid + Metformin 1 gm bid Current orders for Inpatient glycemic control: Lantus 30 units + Novolog sensitive correction tid  Inpatient Diabetes Program Recommendations:   Noted hypoglycemic event this AM of 55 mg/dL.  Consider -Decrease Lantus to 25 units qd -A1c to determine prehospital glycemic control  Thanks, Bronson Curb, MSN, RNC-OB Diabetes Coordinator [phone] (8a-5p)

## 2019-07-23 NOTE — TOC Initial Note (Signed)
Transition of Care [facility]) - Initial/Assessment Note    Patient Details  Name: Jordan Ross MRN: [id number] Date of Birth: 1945-06-28  Transition of Care [facility]) CM/SW Contact:    Zenon Mayo, RN Phone Number: 07/23/2019, 6:33 PM  Clinical Narrative:                 Transfer from 2 H, afib, and CHF, conts on milrinone wean and amio.  TOC team will cont to follow for dc needs.    Expected Discharge Plan: Home/Self Care Barriers to Discharge: Continued Medical Work up   Patient Goals and CMS Choice        Expected Discharge Plan and Services Expected Discharge Plan: Home/Self Care   Discharge Planning Services: CM Consult   Living arrangements for the past 2 months: Single Family Home                                      Prior Living Arrangements/Services Living arrangements for the past 2 months: Single Family Home Lives with:: Self                   Activities of Daily Living Home Assistive Devices/Equipment: None ADL Screening (condition at time of admission) Patient's cognitive ability adequate to safely complete daily activities?: Yes Is the patient deaf or have difficulty hearing?: No Does the patient have difficulty seeing, even when wearing glasses/contacts?: No Does the patient have difficulty concentrating, remembering, or making decisions?: No Patient able to express need for assistance with ADLs?: Yes Does the patient have difficulty dressing or bathing?: No Independently performs ADLs?: Yes (appropriate for developmental age) Does the patient have difficulty walking or climbing stairs?: Yes Weakness of Legs: Both Weakness of Arms/Hands: None  Permission Sought/Granted                  Emotional Assessment       Orientation: : Oriented to Self, Oriented to Place, Oriented to  Time, Oriented to Situation      Admission diagnosis:  Atrial fibrillation with RVR ([place]) [I48.91] Patient Active Problem List   Diagnosis  Date Noted  . Atrial fibrillation with RVR ([place]) 07/12/2019  . Chronic systolic heart failure ([place]) 12/17/2015  . AAA (abdominal aortic aneurysm) ([place]) 05/22/2015  . Coronary artery disease 03/29/2013  . Cardiomyopathy, ischemic 03/29/2013  . ICD (implantable cardioverter-defibrillator), single, in situ 03/29/2013  . Essential hypertension 03/29/2013  . Hyperlipidemia 03/29/2013  . Type 2 diabetes mellitus with vascular disease ([place]) 03/29/2013  . Abdominal aortic aneurysm ([place]) 03/29/2013  . Bilateral renal artery stenosis (H[facility]) 03/29/2013   PCP:  Redmond School, MD Pharmacy:   [place], [place] [id number] [place] [id number] Phone: [phone] Fax: [phone]  [facility] [place], [place] [address] [place] [address] Phone: [phone] Fax: [phone]     Social Determinants of Health (SDOH) Interventions    Readmission Risk Interventions No flowsheet data found.

## 2019-07-24 LAB — GLUCOSE, CAPILLARY
Glucose-Capillary: 107 mg/dL — ABNORMAL HIGH (ref 70–99)
Glucose-Capillary: 124 mg/dL — ABNORMAL HIGH (ref 70–99)
Glucose-Capillary: 202 mg/dL — ABNORMAL HIGH (ref 70–99)
Glucose-Capillary: 179 mg/dL — ABNORMAL HIGH (ref 70–99)

## 2019-07-24 LAB — BASIC METABOLIC PANEL
Anion gap: 10 (ref 5–15)
BUN: 12 mg/dL (ref 8–23)
CO2: 24 mmol/L (ref 22–32)
Calcium: 9 mg/dL (ref 8.9–10.3)
Chloride: 106 mmol/L (ref 98–111)
Creatinine, Ser: 1.17 mg/dL (ref 0.61–1.24)
GFR calc Af Amer: >60 mL/min (ref >60)
GFR calc non Af Amer: >60 mL/min (ref >60)
Glucose, Bld: 133 mg/dL — ABNORMAL HIGH (ref 70–99)
Potassium: 4.1 mmol/L (ref 3.5–5.1)
Sodium: 140 mmol/L (ref 135–145)

## 2019-07-24 LAB — COOXEMETRY PANEL
Carboxyhemoglobin: 0.8 % (ref 0.5–1.5)
Methemoglobin: 0.9 % (ref 0.0–1.5)
O2 Saturation: 69.5 %
Total hemoglobin: 11.8 g/dL — ABNORMAL LOW (ref 12.0–16.0)

## 2019-07-24 MED ORDER — SPIRONOLACTONE 25 MG PO TABS
25.0000 mg | ORAL_TABLET | Freq: Every day | ORAL | Status: DC
  Administered 2019-07-25 – 2019-07-28 (×4): 25 mg via ORAL
  Filled 2019-07-24 (×4): qty 1

## 2019-07-24 MED ORDER — AMIODARONE HCL 200 MG PO TABS
200.0000 mg | ORAL_TABLET | Freq: Two times a day (BID) | ORAL | Status: DC
  Administered 2019-07-24 – 2019-07-29 (×11): 200 mg via ORAL
  Filled 2019-07-24 (×11): qty 1

## 2019-07-24 NOTE — Plan of Care (Signed)
  Problem: Clinical Measurements: Goal: Ability to maintain clinical measurements within normal limits will improve Outcome: Progressing Goal: Will remain free from infection Outcome: Progressing Goal: Respiratory complications will improve Outcome: Progressing Goal: Cardiovascular complication will be avoided Outcome: Progressing   Problem: Coping: Goal: Level of anxiety will decrease Outcome: Progressing   

## 2019-07-24 NOTE — Progress Notes (Addendum)
Advanced Heart Failure Rounding Note   Subjective:    Co-ox stable w/ milrinone wean. 70% today on 0.125 mcg.   Volume status stable. CVP 5-6.   Maintaing NSR on amiodarone.   No complaints today. Feels well.    Objective:   Weight Range:  Vital Signs:   Temp:  [97.5 F (36.4 C)-98 F (36.7 C)] 97.6 F (36.4 C) (04/07 1104) Pulse Rate:  [63-78] 69 (04/07 1104) Resp:  [14-20] 19 (04/07 1104) BP: (93-110)/(63-76) 100/69 (04/07 1104) SpO2:  [96 %-98 %] 97 % (04/07 1104) Weight:  [76.8 kg] 76.8 kg (04/07 0320) Last BM Date: 07/22/19  Weight change: Filed Weights   07/22/19 0310 07/23/19 0331 07/24/19 0320  Weight: 76.2 kg 73.3 kg 76.8 kg    Intake/Output:   Intake/Output Summary (Last 24 hours) at 07/24/2019 1139 Last data filed at 07/24/2019 0842 Gross per 24 hour  Intake 152.35 ml  Output 2100 ml  Net -1947.65 ml     PHYSICAL EXAM: CVP 5-6 General:  Well appearing WM. No respiratory difficulty HEENT: normal Neck: supple. no JVD. Carotids 2+ bilat; no bruits. No lymphadenopathy or thyromegaly appreciated. Cor: PMI nondisplaced. Regular rate & rhythm. No rubs, gallops or murmurs. Lungs: CTAB  Abdomen: soft, nontender, nondistended. No hepatosplenomegaly. No bruits or masses. Good bowel sounds. Extremities: no cyanosis, clubbing, rash, edema + RUE PICC Neuro: alert & oriented x 3, cranial nerves grossly intact. moves all 4 extremities w/o difficulty. Affect pleasant.   Telemetry: NSR mid 70s.  Personally reviewed.  Labs: Basic Metabolic Panel: Recent Labs  Lab 07/20/19 0345 07/20/19 0345 07/21/19 0351 07/21/19 0351 07/22/19 0425 07/23/19 0500 07/24/19 0433  NA 140  --  140  --  139 141 140  K 4.3  --  4.2  --  4.1 3.9 4.1  CL 104  --  106  --  108 106 106  CO2 25  --  26  --  23 25 24   GLUCOSE 137*  --  132*  --  141* 72 133*  BUN 19  --  14  --  12 13 12   CREATININE 1.50*  --  1.37*  --  1.16 1.23 1.17  CALCIUM 9.5   < > 9.3   < > 8.6* 9.1  9.0  MG  --   --  1.7  --   --  1.7  --    < > = values in this interval not displayed.    Liver Function Tests: No results for input(s): AST, ALT, ALKPHOS, BILITOT, PROT, ALBUMIN in the last 168 hours. No results for input(s): LIPASE, AMYLASE in the last 168 hours. No results for input(s): AMMONIA in the last 168 hours.  CBC: Recent Labs  Lab 07/18/19 0314 07/19/19 0340 07/20/19 0345  WBC 6.4 6.8 7.4  HGB 11.1* 11.0* 11.1*  HCT 34.0* 33.2* 33.5*  MCV 92.6 94.1 93.6  PLT 187 187 192    Cardiac Enzymes: No results for input(s): CKTOTAL, CKMB, CKMBINDEX, TROPONINI in the last 168 hours.  BNP: BNP (last 3 results) Recent Labs    06/19/19 1511 07/12/19 1323 07/14/19 0142  BNP 1,000.0* 1,976.9* 1,176.1*    ProBNP (last 3 results) No results for input(s): PROBNP in the last 8760 hours.    Other results:  Imaging: No results found.   Medications:     Scheduled Medications: . apixaban  5 mg Oral BID  . aspirin  81 mg Oral Daily  . atorvastatin  40 mg Oral  Daily  . Chlorhexidine Gluconate Cloth  6 each Topical Daily  . digoxin  0.0625 mg Oral Daily  . docusate sodium  100 mg Oral Daily  . insulin aspart  0-9 Units Subcutaneous TID WC  . insulin glargine  25 Units Subcutaneous QHS  . losartan  12.5 mg Oral Daily  . pantoprazole  40 mg Oral Daily  . polyethylene glycol  17 g Oral Daily  . sodium chloride flush  10-40 mL Intracatheter Q12H  . spironolactone  12.5 mg Oral Daily    Infusions: . sodium chloride    . amiodarone 30 mg/hr (07/23/19 1824)  . milrinone 0.125 mcg/kg/min (07/23/19 1824)    PRN Medications: Place/Maintain arterial line **AND** sodium chloride, acetaminophen, albuterol, nitroGLYCERIN, ondansetron (ZOFRAN) IV, sodium chloride flush, sorbitol   Assessment/Plan:   1. Acute on chronic systolic HF -> cardiogenic shock - due to mixed iCM/NICM - baseline EF 30-35% due to iCM. EF this admit 10-15% due to tachy-induced CM - Initial  co-ox 37% -> milrinone started 3/28 and titrated up to 0.375. - Co-ox stable today w/ milrinone wean at 70% on 0.125 mcg.  - Will stop milrinone today and will f/u co-ox.   - Volume status stable. CVP 5-6. No loop diuretics currently.   - Increase spiro to 25 mg daily  - Continue digoxin 0.125 mg daily. Dig level ok 4/5 - Copntinue losartan 12.5 mg daily. BP too soft for Entresto  - no b-blocker with low output - worsening CM likely due to AF (tachy-mediated CM) but cannot exclude worsening CAD. If fails milrinone wean will do cath prior to d/c - If unable to wean milrinone will need to consider advanced therapies.  2. Afib/flutler, Persistent - with RVR likely the source of his cardiomyopathy. - Now in NSR after cardioversion.   - Will transition to PO amio today, 200 mg bid  - Continue Eliquis 5 mg bid.  No bleeding  3. AKI - baseline creatinine ~1.2 - scr peaked to 2.1, suspect cardiorenal.  - Scr improved and now  normal, 1.17 today  - Continue to follow closely  4. CAD s/p CABG - s/p anterior MI in 2001 followed by CABG - no recent cath but denies angina - continue medical therapy - as above, If fails milrinone wean will do cath prior to d/c  5.  Constipation - resolved with sorbitol  Length of Stay: [place] PA-C 07/24/2019, 11:39 AM  Advanced Heart Failure Team Pager [phone] (M-F; [place])  Please contact [facility] for night-coverage after hours (4p -7a ) and weekends on [email or web address]  Patient seen and examined with the above-signed Advanced Practice Provider and/or Housestaff. I personally reviewed laboratory data, imaging studies and relevant notes. I independently examined the patient and formulated the important aspects of the plan. I have edited the note to reflect any of my changes or salient points. I have personally discussed the plan with the patient and/or family.  Remains on milrinone at 0.125. Co-ox 70%. Remains in NSR on IV amio. CVP 5.  Denies CP or SOB. No orthopnea or PND. No bleeding on Eliquis.  General:  Well appearing. No resp difficulty HEENT: normal Neck: supple. no JVD. Carotids 2+ bilat; no bruits. No lymphadenopathy or thryomegaly appreciated. Cor: PMI nondisplaced. Regular rate & rhythm. No rubs, gallops or murmurs. Lungs: clear Abdomen: soft, nontender, nondistended. No hepatosplenomegaly. No bruits or masses. Good bowel sounds. Extremities: no cyanosis, clubbing, rash, edema Neuro: alert & orientedx3, cranial nerves grossly  intact. moves all 4 extremities w/o difficulty. Affect pleasant  Will stop milrinone and follow co-ox and CVP closely. Now that milrinone being stopped can stop IV amio. Continue Eliquis. If fails milrinone wean will need to consider advanced therapies.   Glori Bickers, MD  6:39 PM

## 2019-07-24 NOTE — Progress Notes (Signed)
CARDIAC REHAB PHASE I   PRE:  Rate/Rhythm: 64 SR    BP: sitting 91/62    SaO2: 98 RA  MODE:  Ambulation: 950 ft   POST:  Rate/Rhythm: 85 SR    BP: sitting 111/76     SaO2: 98 RA  Pt happy to walk outside. Sat for appr. 3 min to enjoy sun. No c/o, feels well. Return to recliner. Encouraged more walking on unit. [id number]   [place], ACSM 07/24/2019 2:06 PM

## 2019-07-25 LAB — COOXEMETRY PANEL
Carboxyhemoglobin: 1 % (ref 0.5–1.5)
Methemoglobin: 0.8 % (ref 0.0–1.5)
O2 Saturation: 62.2 %
Total hemoglobin: 11.8 g/dL — ABNORMAL LOW (ref 12.0–16.0)

## 2019-07-25 LAB — BASIC METABOLIC PANEL
Anion gap: 9 (ref 5–15)
BUN: 14 mg/dL (ref 8–23)
CO2: 24 mmol/L (ref 22–32)
Calcium: 9.3 mg/dL (ref 8.9–10.3)
Chloride: 104 mmol/L (ref 98–111)
Creatinine, Ser: 1.28 mg/dL — ABNORMAL HIGH (ref 0.61–1.24)
GFR calc Af Amer: >60 mL/min (ref >60)
GFR calc non Af Amer: 56 mL/min — ABNORMAL LOW (ref >60)
Glucose, Bld: 166 mg/dL — ABNORMAL HIGH (ref 70–99)
Potassium: 4.8 mmol/L (ref 3.5–5.1)
Sodium: 137 mmol/L (ref 135–145)

## 2019-07-25 LAB — GLUCOSE, CAPILLARY
Glucose-Capillary: 142 mg/dL — ABNORMAL HIGH (ref 70–99)
Glucose-Capillary: 121 mg/dL — ABNORMAL HIGH (ref 70–99)
Glucose-Capillary: 218 mg/dL — ABNORMAL HIGH (ref 70–99)
Glucose-Capillary: 160 mg/dL — ABNORMAL HIGH (ref 70–99)

## 2019-07-25 NOTE — Progress Notes (Signed)
CARDIAC REHAB PHASE I   PRE:  Rate/Rhythm: 44 SR with occ PVC    BP: sitting 102/69    SaO2:   MODE:  Ambulation: 1000 ft   POST:  Rate/Rhythm: 86 SR with occ PVC    BP: sitting 109/72     SaO2: 99 RA  Tolerated well. Denied c/o walking. Walked outside without rest.  He does st his upper pubic area is sore from excessive urination. Hurts to cough, press.  Will f/u. [id number]  [place], ACSM 07/25/2019 11:35 AM

## 2019-07-25 NOTE — Progress Notes (Addendum)
Advanced Heart Failure Rounding Note   Subjective:    Off milrinone. Co-ox 62%  Volume status stable. Maintaing NSR on amiodarone.   Ambulated w/ CR today. Did well. No exertional dyspnea.    Objective:   Weight Range:  Vital Signs:   Temp:  [97.6 F (36.4 C)-98.5 F (36.9 C)] 97.9 F (36.6 C) (04/08 1050) Pulse Rate:  [69-73] 70 (04/08 0743) Resp:  [14-20] 15 (04/08 0743) BP: (101-111)/(60-78) 102/69 (04/08 1050) SpO2:  [98 %] 98 % (04/08 0450) Weight:  [75.5 kg] 75.5 kg (04/08 0450) Last BM Date: 07/24/19  Weight change: Filed Weights   07/23/19 0331 07/24/19 0320 07/25/19 0450  Weight: 73.3 kg 76.8 kg 75.5 kg    Intake/Output:   Intake/Output Summary (Last 24 hours) at 07/25/2019 1142 Last data filed at 07/25/2019 0700 Gross per 24 hour  Intake 517 ml  Output 950 ml  Net -433 ml    PHYSICAL EXAM: General:  Well appearing. No respiratory difficulty HEENT: normal Neck: supple. no JVD. Carotids 2+ bilat; no bruits. No lymphadenopathy or thyromegaly appreciated. Cor: PMI nondisplaced. Regular rate & rhythm. No rubs, gallops or murmurs. Lungs: clear Abdomen: soft, nontender, nondistended. No hepatosplenomegaly. No bruits or masses. Good bowel sounds. Extremities: no cyanosis, clubbing, rash, edema Neuro: alert & oriented x 3, cranial nerves grossly intact. moves all 4 extremities w/o difficulty. Affect pleasant.   Telemetry: NSR mid 70s.  Personally reviewed.  Labs: Basic Metabolic Panel: Recent Labs  Lab 07/21/19 0351 07/21/19 0351 07/22/19 0425 07/22/19 0425 07/23/19 0500 07/24/19 0433 07/25/19 0500  NA 140  --  139  --  141 140 137  K 4.2  --  4.1  --  3.9 4.1 4.8  CL 106  --  108  --  106 106 104  CO2 26  --  23  --  25 24 24   GLUCOSE 132*  --  141*  --  72 133* 166*  BUN 14  --  12  --  13 12 14   CREATININE 1.37*  --  1.16  --  1.23 1.17 1.28*  CALCIUM 9.3   < > 8.6*   < > 9.1 9.0 9.3  MG 1.7  --   --   --  1.7  --   --    < > = values  in this interval not displayed.    Liver Function Tests: No results for input(s): AST, ALT, ALKPHOS, BILITOT, PROT, ALBUMIN in the last 168 hours. No results for input(s): LIPASE, AMYLASE in the last 168 hours. No results for input(s): AMMONIA in the last 168 hours.  CBC: Recent Labs  Lab 07/19/19 0340 07/20/19 0345  WBC 6.8 7.4  HGB 11.0* 11.1*  HCT 33.2* 33.5*  MCV 94.1 93.6  PLT 187 192    Cardiac Enzymes: No results for input(s): CKTOTAL, CKMB, CKMBINDEX, TROPONINI in the last 168 hours.  BNP: BNP (last 3 results) Recent Labs    06/19/19 1511 07/12/19 1323 07/14/19 0142  BNP 1,000.0* 1,976.9* 1,176.1*    ProBNP (last 3 results) No results for input(s): PROBNP in the last 8760 hours.    Other results:  Imaging: No results found.   Medications:     Scheduled Medications: . amiodarone  200 mg Oral BID  . apixaban  5 mg Oral BID  . aspirin  81 mg Oral Daily  . atorvastatin  40 mg Oral Daily  . Chlorhexidine Gluconate Cloth  6 each Topical Daily  . digoxin  0.0625 mg Oral Daily  . docusate sodium  100 mg Oral Daily  . insulin aspart  0-9 Units Subcutaneous TID WC  . insulin glargine  25 Units Subcutaneous QHS  . losartan  12.5 mg Oral Daily  . pantoprazole  40 mg Oral Daily  . polyethylene glycol  17 g Oral Daily  . sodium chloride flush  10-40 mL Intracatheter Q12H  . spironolactone  25 mg Oral Daily    Infusions: . sodium chloride      PRN Medications: Place/Maintain arterial line **AND** sodium chloride, acetaminophen, albuterol, nitroGLYCERIN, ondansetron (ZOFRAN) IV, sodium chloride flush, sorbitol   Assessment/Plan:   1. Acute on chronic systolic HF -> cardiogenic shock - due to mixed iCM/NICM - baseline EF 30-35% due to iCM. EF this admit 10-15% due to tachy-induced CM - Initial co-ox 37% -> milrinone started 3/28 and titrated up to 0.375. - now off milrinone w/ stable co-ox 62%. Follow co-ox for 1 more day  - Volume status stable.  No loop diuretics currently.   - Continue spiro 25 mg daily  - Continue digoxin 0.125 mg daily. Dig level ok 4/5 - Continue losartan 12.5 mg daily. BP too soft for Entresto  - no b-blocker with low output - worsening CM likely due to AF (tachy-mediated CM) but cannot exclude worsening CAD. If fails milrinone wean will do cath prior to d/c - If unable to wean milrinone will need to consider advanced therapies. - will follow co-ox for 1 more day. If stable, possibly d/c home tomorrow. If low co-ox, plan LHC as noted above.    2. Afib/flutler, Persistent - with RVR likely the source of his cardiomyopathy. - Now in NSR after cardioversion.   - Continue PO amio , 200 mg bid  - Continue Eliquis 5 mg bid.  No bleeding  3. AKI - baseline creatinine ~1.2 - scr peaked to 2.1, suspect cardiorenal.  - Scr improved and now  normal, 1.28 today  - Continue to follow closely  4. CAD s/p CABG - s/p anterior MI in 2001 followed by CABG - no recent cath but denies angina - continue medical therapy - as above, If fails milrinone wean will do cath prior to d/c  5.  Constipation - resolved with sorbitol  Length of Stay: [place] PA-C 07/25/2019, 11:42 AM  Advanced Heart Failure Team Pager [phone] (M-F; [place])  Please contact [facility] for night-coverage after hours (4p -7a ) and weekends on [email or web address]  Patient seen and examined with the above-signed Advanced Practice Provider and/or Housestaff. I personally reviewed laboratory data, imaging studies and relevant notes. I independently examined the patient and formulated the important aspects of the plan. I have edited the note to reflect any of my changes or salient points. I have personally discussed the plan with the patient and/or family.  Now off milrinone. Co-ox stable at 62%. Volume status looks good. Maintaining NSR on amiodarone. On Eliquis no bleeding  General:  Well appearing. No resp difficulty HEENT:  normal Neck: supple. no JVD. Carotids 2+ bilat; no bruits. No lymphadenopathy or thryomegaly appreciated. Cor: PMI nondisplaced. Regular rate & rhythm. No rubs, gallops or murmurs. Lungs: clear Abdomen: soft, nontender, nondistended. No hepatosplenomegaly. No bruits or masses. Good bowel sounds. Extremities: no cyanosis, clubbing, rash, edema Neuro: alert & orientedx3, cranial nerves grossly intact. moves all 4 extremities w/o difficulty. Affect pleasant  Doing well off milrinone x 24 hours. Watch CVP and co-ox. If stable in am can go  home.   Glori Bickers, MD  2:49 PM

## 2019-07-25 NOTE — Plan of Care (Signed)
  Problem: Clinical Measurements: Goal: Ability to maintain clinical measurements within normal limits will improve Outcome: Progressing Goal: Will remain free from infection Outcome: Progressing Goal: Respiratory complications will improve Outcome: Progressing Goal: Cardiovascular complication will be avoided Outcome: Progressing   Problem: Coping: Goal: Level of anxiety will decrease Outcome: Progressing   Problem: Elimination: Goal: Will not experience complications related to bowel motility Outcome: Progressing Goal: Will not experience complications related to urinary retention Outcome: Progressing   Problem: Pain Managment: Goal: General experience of comfort will improve Outcome: Progressing   Problem: Safety: Goal: Ability to remain free from injury will improve Outcome: Progressing   Problem: Skin Integrity: Goal: Risk for impaired skin integrity will decrease Outcome: Progressing   Problem: Education: Goal: Ability to demonstrate management of disease process will improve Outcome: Progressing Goal: Ability to verbalize understanding of medication therapies will improve Outcome: Progressing Goal: Individualized Educational Video(s) Outcome: Progressing   Problem: Activity: Goal: Capacity to carry out activities will improve Outcome: Progressing   Problem: Cardiac: Goal: Ability to achieve and maintain adequate cardiopulmonary perfusion will improve Outcome: Progressing

## 2019-07-26 ENCOUNTER — Inpatient Hospital Stay (HOSPITAL_COMMUNITY): Payer: Medicare Other

## 2019-07-26 DIAGNOSIS — R570 Cardiogenic shock: Secondary | ICD-10-CM

## 2019-07-26 DIAGNOSIS — N179 Acute kidney failure, unspecified: Secondary | ICD-10-CM

## 2019-07-26 HISTORY — PX: IR US GUIDE VASC ACCESS RIGHT: IMG2390

## 2019-07-26 HISTORY — PX: IR FLUORO GUIDE CV LINE RIGHT: IMG2283

## 2019-07-26 LAB — BASIC METABOLIC PANEL
Anion gap: 8 (ref 5–15)
BUN: 18 mg/dL (ref 8–23)
CO2: 23 mmol/L (ref 22–32)
Calcium: 9.2 mg/dL (ref 8.9–10.3)
Chloride: 104 mmol/L (ref 98–111)
Creatinine, Ser: 1.32 mg/dL — ABNORMAL HIGH (ref 0.61–1.24)
GFR calc Af Amer: >60 mL/min (ref >60)
GFR calc non Af Amer: 54 mL/min — ABNORMAL LOW (ref >60)
Glucose, Bld: 279 mg/dL — ABNORMAL HIGH (ref 70–99)
Potassium: 4.6 mmol/L (ref 3.5–5.1)
Sodium: 135 mmol/L (ref 135–145)

## 2019-07-26 LAB — COOXEMETRY PANEL
Carboxyhemoglobin: 0.8 % (ref 0.5–1.5)
Carboxyhemoglobin: 0.9 % (ref 0.5–1.5)
Carboxyhemoglobin: 0.9 % (ref 0.5–1.5)
Methemoglobin: 0.8 % (ref 0.0–1.5)
Methemoglobin: 0.9 % (ref 0.0–1.5)
Methemoglobin: 0.9 % (ref 0.0–1.5)
O2 Saturation: 50.2 %
O2 Saturation: 42.6 %
O2 Saturation: 51.7 %
Total hemoglobin: 12.2 g/dL (ref 12.0–16.0)
Total hemoglobin: 12.4 g/dL (ref 12.0–16.0)
Total hemoglobin: 13.3 g/dL (ref 12.0–16.0)

## 2019-07-26 LAB — GLUCOSE, CAPILLARY
Glucose-Capillary: 92 mg/dL (ref 70–99)
Glucose-Capillary: 110 mg/dL — ABNORMAL HIGH (ref 70–99)
Glucose-Capillary: 222 mg/dL — ABNORMAL HIGH (ref 70–99)

## 2019-07-26 MED ORDER — LIDOCAINE HCL (PF) 1 % IJ SOLN
INTRAMUSCULAR | Status: DC | PRN
  Administered 2019-07-26: 5 mL

## 2019-07-26 MED ORDER — MILRINONE LACTATE IN DEXTROSE 20-5 MG/100ML-% IV SOLN
0.1250 ug/kg/min | INTRAVENOUS | Status: DC
  Administered 2019-07-26 – 2019-07-27 (×2): 0.125 ug/kg/min via INTRAVENOUS
  Filled 2019-07-26 (×5): qty 100

## 2019-07-26 MED ORDER — SODIUM CHLORIDE 0.9% FLUSH
3.0000 mL | Freq: Two times a day (BID) | INTRAVENOUS | Status: DC
  Administered 2019-07-26 – 2019-07-29 (×4): 3 mL via INTRAVENOUS

## 2019-07-26 MED ORDER — GELATIN ABSORBABLE 12-7 MM EX MISC
CUTANEOUS | Status: AC
  Filled 2019-07-26: qty 1

## 2019-07-26 MED ORDER — LIDOCAINE HCL 1 % IJ SOLN
INTRAMUSCULAR | Status: AC
  Filled 2019-07-26: qty 20

## 2019-07-26 MED ORDER — HEPARIN (PORCINE) 25000 UT/250ML-% IV SOLN
800.0000 [IU]/h | INTRAVENOUS | Status: DC
  Administered 2019-07-26 (×2): 1000 [IU]/h via INTRAVENOUS
  Filled 2019-07-26 (×4): qty 250

## 2019-07-26 NOTE — Progress Notes (Addendum)
Advanced Heart Failure Rounding Note   Subjective:    Milrinone was stopped 07/24/19. CO-OX down to 43%.   Denies SOB. Denies dizziness. Frustrated wants to go home.     Objective:   Weight Range:  Vital Signs:   Temp:  [97.6 F (36.4 C)-98.6 F (37 C)] 98.6 F (37 C) (04/09 0721) Pulse Rate:  [62-78] 69 (04/09 0721) Resp:  [11-22] 22 (04/09 0721) BP: (100-105)/(60-73) 105/73 (04/09 0721) SpO2:  [98 %-99 %] 99 % (04/08 2300) Weight:  [76 kg] 76 kg (04/09 0400) Last BM Date: 07/24/19  Weight change: Filed Weights   07/24/19 0320 07/25/19 0450 07/26/19 0400  Weight: 76.8 kg 75.5 kg 76 kg    Intake/Output:   Intake/Output Summary (Last 24 hours) at 07/26/2019 1010 Last data filed at 07/26/2019 0700 Gross per 24 hour  Intake 750 ml  Output 1175 ml  Net -425 ml     General:  No resp difficulty. Sitting in the chair.  HEENT: normal Neck: supple. no JVD. Carotids 2+ bilat; no bruits. No lymphadenopathy or thryomegaly appreciated. Cor: PMI nondisplaced. Regular rate & rhythm. No rubs, gallops or murmurs. Lungs: clear Abdomen: soft, nontender, nondistended. No hepatosplenomegaly. No bruits or masses. Good bowel sounds. Extremities: no cyanosis, clubbing, rash, edema. RUE PICC  Neuro: alert & orientedx3, cranial nerves grossly intact. moves all 4 extremities w/o difficulty. Affect pleasant   Telemetry: NSR 70s   Labs: Basic Metabolic Panel: Recent Labs  Lab 07/21/19 0351 07/21/19 0351 07/22/19 0425 07/22/19 0425 07/23/19 0500 07/24/19 0433 07/25/19 0500  NA 140  --  139  --  141 140 137  K 4.2  --  4.1  --  3.9 4.1 4.8  CL 106  --  108  --  106 106 104  CO2 26  --  23  --  25 24 24   GLUCOSE 132*  --  141*  --  72 133* 166*  BUN 14  --  12  --  13 12 14   CREATININE 1.37*  --  1.16  --  1.23 1.17 1.28*  CALCIUM 9.3   < > 8.6*   < > 9.1 9.0 9.3  MG 1.7  --   --   --  1.7  --   --    < > = values in this interval not displayed.    Liver Function  Tests: No results for input(s): AST, ALT, ALKPHOS, BILITOT, PROT, ALBUMIN in the last 168 hours. No results for input(s): LIPASE, AMYLASE in the last 168 hours. No results for input(s): AMMONIA in the last 168 hours.  CBC: Recent Labs  Lab 07/20/19 0345  WBC 7.4  HGB 11.1*  HCT 33.5*  MCV 93.6  PLT 192    Cardiac Enzymes: No results for input(s): CKTOTAL, CKMB, CKMBINDEX, TROPONINI in the last 168 hours.  BNP: BNP (last 3 results) Recent Labs    06/19/19 1511 07/12/19 1323 07/14/19 0142  BNP 1,000.0* 1,976.9* 1,176.1*    ProBNP (last 3 results) No results for input(s): PROBNP in the last 8760 hours.    Other results:  Imaging: No results found.   Medications:     Scheduled Medications: . amiodarone  200 mg Oral BID  . apixaban  5 mg Oral BID  . aspirin  81 mg Oral Daily  . atorvastatin  40 mg Oral Daily  . Chlorhexidine Gluconate Cloth  6 each Topical Daily  . digoxin  0.0625 mg Oral Daily  . docusate sodium  100 mg Oral Daily  . insulin aspart  0-9 Units Subcutaneous TID WC  . insulin glargine  25 Units Subcutaneous QHS  . losartan  12.5 mg Oral Daily  . pantoprazole  40 mg Oral Daily  . polyethylene glycol  17 g Oral Daily  . sodium chloride flush  10-40 mL Intracatheter Q12H  . spironolactone  25 mg Oral Daily    Infusions: . sodium chloride      PRN Medications: Place/Maintain arterial line **AND** sodium chloride, acetaminophen, albuterol, nitroGLYCERIN, ondansetron (ZOFRAN) IV, sodium chloride flush, sorbitol   Assessment/Plan:   1. Acute on chronic systolic HF -> cardiogenic shock - due to mixed iCM/NICM - baseline EF 30-35% due to iCM. EF this admit 10-15% due to tachy-induced CM - Initial co-ox 37% -> milrinone started 3/28 and titrated up to 0.375. - Off milrinone on  07/24/19. CO-OX 50% with repeat lower at 43%.  - Start milrinone 0.125 mcg.  - Volume status stable.  - Continue spiro 25 mg daily  - Continue digoxin 0.125 mg daily.  Dig level ok 4/5 - Continue losartan 12.5 mg daily. BP too soft for Entresto  - no b-blocker with low output - worsening CM likely due to AF (tachy-mediated CM) but cannot exclude worsening CAD. If fails milrinone wean will do cath prior to d/c.    2. Afib/flutler, Persistent - with RVR likely the source of his cardiomyopathy. - Maintaining NSR.    - Continue PO amio, 200 mg bid . May need IV amio with milrinone restarting.  - Continue Eliquis 5 mg bid.  No bleeding  3. AKI - baseline creatinine ~1.2 - scr peaked to 2.1, suspect cardiorenal.  - Check BMET now  4. CAD s/p CABG - s/p anterior MI in 2001 followed by CABG - no recent cath but denies angina - continue medical therapy - as above, If fails milrinone wean will do cath prior to d/c  5.  Constipation - resolved with sorbitol  May need LHC. BMET pending.   Restarting milrinone today.Discussed possible home milrinone.  Discussed possible need for advanced therapies. He is not sure he was want to pursue VAD but is willing to talk to [place] coordinators.   Length of Stay: [place] NP-C  07/26/2019, 10:10 AM  Advanced Heart Failure Team Pager [phone] (M-F; [place])  Please contact [facility] for night-coverage after hours (4p -7a ) and weekends on [email or web address]  Patient seen and examined with the above-signed Advanced Practice Provider and/or Housestaff. I personally reviewed laboratory data, imaging studies and relevant notes. I independently examined the patient and formulated the important aspects of the plan. I have edited the note to reflect any of my changes or salient points. I have personally discussed the plan with the patient and/or family.  Failed milrinone wean again with co-ox 43%. Will restart milrinone. Denies CP or SOB. CVP ok. Remains in NSR on amio  General:  Well appearing. No resp difficulty HEENT: normal Neck: supple. no JVD. Carotids 2+ bilat; no bruits. No lymphadenopathy or thryomegaly  appreciated. Cor: PMI nondisplaced. Regular rate & rhythm. No rubs, gallops or murmurs. Lungs: clear Abdomen: soft, nontender, nondistended. No hepatosplenomegaly. No bruits or masses. Good bowel sounds. Extremities: no cyanosis, clubbing, rash, edema Neuro: alert & orientedx3, cranial nerves grossly intact. moves all 4 extremities w/o difficulty. Affect pleasant  I suspect he has AF cardiomyopathy on top of iCM but remains inotrope dependent after stopping milrinone. Will plan cath on Monday to  exclude progression of CAD. Switch Eliquis to heparin. If CAD stable, would send home on milrinone to wean slowly as outpatient. If fails outpatient wean will need to consider VAD (or possibly upgrade to CRT first)  Will need arrangements for home milrinone with tunneled PICC. He has ICD so no need for LifeVest.    Glori Bickers, MD  4:06 PM

## 2019-07-26 NOTE — Progress Notes (Signed)
ANTICOAGULATION CONSULT NOTE - Initial Consult  Pharmacy Consult for heparin Indication: atrial fibrillation  No Known Allergies  Patient Measurements: Height: 6' (182.9 cm) Weight: 76 kg (167 lb 8.8 oz) IBW/kg (Calculated) : 77.6 Heparin Dosing Weight: 76.2 kg  Vital Signs: Temp: 98.6 F (37 C) (04/09 0721) Temp Source: Oral (04/09 0721) BP: 105/73 (04/09 0721) Pulse Rate: 69 (04/09 0721)  Labs: Recent Labs    07/24/19 0433 07/25/19 0500  CREATININE 1.17 1.28*    Estimated Creatinine Clearance: 56.9 mL/min (A) (by C-G formula based on SCr of 1.28 mg/dL (H)).   Medical History: Past Medical History:  Diagnosis Date  . Abdominal aortic aneurysm ([place])   . AICD (automatic cardioverter/defibrillator) present    St Jude  . Anemia    iron deficiency anemia,takes iron pill daily  . Arthritis   . Coronary artery disease    AWMI 2001  . Diverticulosis   . GERD (gastroesophageal reflux disease)    takes Protonix daily  . History of colon polyps    benign  . History of migraine 40 yrs ago  . History of shingles   . Hyperlipidemia    takes Atorvastatin and Niacin daily  . Hypertension    takes Diovan,Spironolactone, and Carvedilol daily  . ICD (implantable cardioverter-defibrillator), single, in situ   . Ischemic cardiomyopathy   . Joint swelling   . LV dysfunction    takes Digoxin daily  . Myocardial infarction ([place]) 2001  . Pneumonia 40 yrs ago   hx of  . Shortness of breath dyspnea    with exertion.States he was a smoker for yr  . Status post coronary artery bypass grafting   . Type 2 diabetes mellitus (HCC)    takes Glipizide,Metformin,Invokana,and Lantus daily.Average fasting blood sugar runs about 140    Medications:  Scheduled:  . amiodarone  200 mg Oral BID  . apixaban  5 mg Oral BID  . aspirin  81 mg Oral Daily  . atorvastatin  40 mg Oral Daily  . Chlorhexidine Gluconate Cloth  6 each Topical Daily  . digoxin  0.0625 mg Oral Daily  . docusate  sodium  100 mg Oral Daily  . insulin aspart  0-9 Units Subcutaneous TID WC  . insulin glargine  25 Units Subcutaneous QHS  . losartan  12.5 mg Oral Daily  . pantoprazole  40 mg Oral Daily  . polyethylene glycol  17 g Oral Daily  . sodium chloride flush  10-40 mL Intracatheter Q12H  . spironolactone  25 mg Oral Daily   Infusions:  . sodium chloride    . milrinone      Assessment: 74 yo M on apixaban 5 mg BID for afib. Plan to go to cath lab on Monday for RHC and LHC. Pharmacy has been consulted to transition apixaban to heparin.  Last dose of apixaban was given on 4/9 @ 0940  Goal of Therapy:  Heparin level 0.3-0.7 units/ml aPTT 66-102 seconds Monitor platelets by anticoagulation protocol: Yes   Plan:  Start IV heparin 1000 units/hr at 2200 tonight Stop apixaban Check heparin level and aPTT with AM labs tomorrow Daily heparin level, aPTT, and CBC  Vertis Kelch, PharmD, [facility] PGY2 Cardiology Pharmacy Resident Phone [phone] 07/26/2019       10:53 AM  Please check [email or web address] for unit-specific pharmacist phone numbers

## 2019-07-26 NOTE — Progress Notes (Signed)
   Patient Status: [facility] - In-pt  Assessment and Plan: Patient in need of venous access.  Patient with plans to discharge on home milrinone. He currently has a R arm PICC in place, however request made for tunneled central venous access due to long-term needs.   Discussed with patient and family at bedside.  He is agreeable.   Risks and benefits discussed with the patient including, but not limited to bleeding, infection, vascular injury, pneumothorax which may require chest tube placement, air embolism or even death  All of the patient's questions were answered, patient is agreeable to proceed. Consent signed and in chart.  ______________________________________________________________________   History of Present Illness: AHMON GAIR is a 74 y.o. male with past medical history of acute on chronic systolic HF and cardiogenic shock, worsening cardiomyopathy due to a fib.  Requiring home milrinon infusions.  In need of venous access.   Allergies and medications reviewed.   Review of Systems: A 12 point ROS discussed and pertinent positives are indicated in the HPI above.  All other systems are negative.  Review of Systems  Constitutional: Negative for fatigue and fever.  Respiratory: Negative for cough and shortness of breath.   Cardiovascular: Negative for chest pain.  Gastrointestinal: Negative for abdominal pain.  Musculoskeletal: Negative for back pain.  Psychiatric/Behavioral: Negative for behavioral problems and confusion.    Vital Signs: BP 115/81   Pulse 72   Temp 97.7 F (36.5 C) (Oral)   Resp 13   Ht 6' (1.829 m)   Wt 167 lb 8.8 oz (76 kg)   SpO2 99%   BMI 22.72 kg/m   Physical Exam Vitals and nursing note reviewed.  Constitutional:      General: He is not in acute distress.    Appearance: He is well-developed. He is not ill-appearing.  Cardiovascular:     Rate and Rhythm: Normal rate. Rhythm irregular.  Pulmonary:     Effort: Pulmonary effort is  normal.     Breath sounds: Normal breath sounds.  Skin:    General: Skin is warm and dry.  Neurological:     General: No focal deficit present.     Mental Status: He is alert and oriented to person, place, and time.  Psychiatric:        Mood and Affect: Mood normal.        Behavior: Behavior normal.      Imaging reviewed.   Labs:  COAGS: Recent Labs    07/16/19 0358 07/16/19 0549 07/17/19 0429 07/18/19 0314  INR 1.3*  --   --   --   APTT >200* 87* 73* 66*    BMP: Recent Labs    07/22/19 0425 07/23/19 0500 07/24/19 0433 07/25/19 0500  NA 139 141 140 137  K 4.1 3.9 4.1 4.8  CL 108 106 106 104  CO2 23 25 24 24   GLUCOSE 141* 72 133* 166*  BUN 12 13 12 14   CALCIUM 8.6* 9.1 9.0 9.3  CREATININE 1.16 1.23 1.17 1.28*  GFRNONAA >60 59* >60 56*  GFRAA >60 >60 >60 >60       Electronically Signed: Docia Barrier, PA 07/26/2019, 3:28 PM   I spent a total of 10 minutes in face to face in clinical consultation, greater than 50% of which was counseling/coordinating care for venous access.

## 2019-07-26 NOTE — TOC Progression Note (Addendum)
Transition of Care [facility]) - Progression Note    Patient Details  Name: GLENWOOD PENDELTON MRN: [id number] Date of Birth: 03/18/1946  Transition of [facility]) CM/SW Contact  Zenon Mayo, RN Phone Number: 07/26/2019, 3:52 PM  Clinical Narrative:    NCM spoke with wife, needs [facility]  NCM offered choice , she states she does not have a preference. NCM made referral to [place] with [facility], awaiting to hear back.  NCM faxed paperwork to [place] with [facility] also. Carolynn Sayers is aware of home milrinone for patient.   1720 - NCM received message from [place] with [facility] , they have declined HHRN for milrinone, will need to check with either [facility] or [facility].    Expected Discharge Plan: Home/Self Care Barriers to Discharge: Continued Medical Work up  Expected Discharge Plan and Services Expected Discharge Plan: Home/Self Care   Discharge Planning Services: CM Consult   Living arrangements for the past 2 months: Single Family Home                                       Social Determinants of Health (SDOH) Interventions    Readmission Risk Interventions No flowsheet data found.

## 2019-07-26 NOTE — Procedures (Signed)
Interventional Radiology Procedure Note  Procedure: RT IJ TUNNELED PICC  Complications: None  Estimated Blood Loss: MIN  Findings: TIP SVCRA     

## 2019-07-26 NOTE — Progress Notes (Signed)
  Per Dr Haroldine Laws  Home with milrinone 0.125 mcg Monday after cath.   HH orders placed for home milrinone. Discussed with Carolynn Sayers.   [facility] order for Life Vest placed.   RHC/LHC set up for Monday.   Jamirah Zelaya NP-C  3:11 PM

## 2019-07-26 NOTE — Progress Notes (Signed)
Came to ambulate however pts daughter is here and he declined right now. Sts he will walk later. Answered questions regarding his heart. Will f/u tomorrow. [place], ACSM 2:37 PM 07/26/2019

## 2019-07-27 LAB — CBC
HCT: 35 % — ABNORMAL LOW (ref 39.0–52.0)
Hemoglobin: 11.3 g/dL — ABNORMAL LOW (ref 13.0–17.0)
MCH: 30.7 pg (ref 26.0–34.0)
MCHC: 32.3 g/dL (ref 30.0–36.0)
MCV: 95.1 fL (ref 80.0–100.0)
Platelets: 273 10*3/uL (ref 150–400)
RBC: 3.68 MIL/uL — ABNORMAL LOW (ref 4.22–5.81)
RDW: 14.7 % (ref 11.5–15.5)
WBC: 6.2 10*3/uL (ref 4.0–10.5)
nRBC: 0 % (ref 0.0–0.2)

## 2019-07-27 LAB — GLUCOSE, CAPILLARY
Glucose-Capillary: 100 mg/dL — ABNORMAL HIGH (ref 70–99)
Glucose-Capillary: 99 mg/dL (ref 70–99)
Glucose-Capillary: 189 mg/dL — ABNORMAL HIGH (ref 70–99)
Glucose-Capillary: 225 mg/dL — ABNORMAL HIGH (ref 70–99)

## 2019-07-27 LAB — BASIC METABOLIC PANEL
Anion gap: 7 (ref 5–15)
BUN: 16 mg/dL (ref 8–23)
CO2: 24 mmol/L (ref 22–32)
Calcium: 8.9 mg/dL (ref 8.9–10.3)
Chloride: 108 mmol/L (ref 98–111)
Creatinine, Ser: 1.38 mg/dL — ABNORMAL HIGH (ref 0.61–1.24)
GFR calc Af Amer: 59 mL/min — ABNORMAL LOW (ref >60)
GFR calc non Af Amer: 51 mL/min — ABNORMAL LOW (ref >60)
Glucose, Bld: 126 mg/dL — ABNORMAL HIGH (ref 70–99)
Potassium: 4.3 mmol/L (ref 3.5–5.1)
Sodium: 139 mmol/L (ref 135–145)

## 2019-07-27 LAB — HEPARIN LEVEL (UNFRACTIONATED): Heparin Unfractionated: 1.94 IU/mL — ABNORMAL HIGH (ref 0.30–0.70)

## 2019-07-27 LAB — COOXEMETRY PANEL
Carboxyhemoglobin: 1.2 % (ref 0.5–1.5)
Methemoglobin: 1.2 % (ref 0.0–1.5)
O2 Saturation: 54.9 %
Total hemoglobin: 12.9 g/dL (ref 12.0–16.0)

## 2019-07-27 LAB — APTT: aPTT: 96 seconds — ABNORMAL HIGH (ref 24–36)

## 2019-07-27 MED ORDER — LOSARTAN POTASSIUM 25 MG PO TABS
12.5000 mg | ORAL_TABLET | Freq: Two times a day (BID) | ORAL | Status: DC
  Administered 2019-07-27 – 2019-07-28 (×3): 12.5 mg via ORAL
  Filled 2019-07-27 (×3): qty 1

## 2019-07-27 NOTE — Progress Notes (Signed)
ANTICOAGULATION CONSULT NOTE - Follow Up Consult  Pharmacy Consult for heparin Indication: atrial fibrillation  No Known Allergies  Patient Measurements: Height: 6' (182.9 cm) Weight: 75.9 kg (167 lb 5.3 oz) IBW/kg (Calculated) : 77.6 Heparin Dosing Weight: 76.2 kg  Vital Signs: Temp: 97.9 F (36.6 C) (04/10 0335) Temp Source: Oral (04/10 0335) BP: 108/72 (04/10 0335) Pulse Rate: 67 (04/10 0335)  Labs: Recent Labs    07/25/19 0500 07/26/19 1444 07/27/19 0550  HGB  --   --  11.3*  HCT  --   --  35.0*  PLT  --   --  273  CREATININE 1.28* 1.32* 1.38*    Estimated Creatinine Clearance: 52.7 mL/min (A) (by C-G formula based on SCr of 1.38 mg/dL (H)).   Medical History: Past Medical History:  Diagnosis Date  . Abdominal aortic aneurysm ([place])   . AICD (automatic cardioverter/defibrillator) present    St Jude  . Anemia    iron deficiency anemia,takes iron pill daily  . Arthritis   . Coronary artery disease    AWMI 2001  . Diverticulosis   . GERD (gastroesophageal reflux disease)    takes Protonix daily  . History of colon polyps    benign  . History of migraine 40 yrs ago  . History of shingles   . Hyperlipidemia    takes Atorvastatin and Niacin daily  . Hypertension    takes Diovan,Spironolactone, and Carvedilol daily  . ICD (implantable cardioverter-defibrillator), single, in situ   . Ischemic cardiomyopathy   . Joint swelling   . LV dysfunction    takes Digoxin daily  . Myocardial infarction ([place]) 2001  . Pneumonia 40 yrs ago   hx of  . Shortness of breath dyspnea    with exertion.States he was a smoker for yr  . Status post coronary artery bypass grafting   . Type 2 diabetes mellitus (HCC)    takes Glipizide,Metformin,Invokana,and Lantus daily.Average fasting blood sugar runs about 140    Medications:  Scheduled:  . amiodarone  200 mg Oral BID  . aspirin  81 mg Oral Daily  . atorvastatin  40 mg Oral Daily  . Chlorhexidine Gluconate Cloth  6  each Topical Daily  . digoxin  0.0625 mg Oral Daily  . docusate sodium  100 mg Oral Daily  . insulin aspart  0-9 Units Subcutaneous TID WC  . insulin glargine  25 Units Subcutaneous QHS  . losartan  12.5 mg Oral Daily  . pantoprazole  40 mg Oral Daily  . polyethylene glycol  17 g Oral Daily  . sodium chloride flush  10-40 mL Intracatheter Q12H  . sodium chloride flush  3 mL Intravenous Q12H  . spironolactone  25 mg Oral Daily   Infusions:  . sodium chloride    . heparin 1,000 Units/hr (07/26/19 2138)  . milrinone 0.125 mcg/kg/min (07/26/19 1145)    Assessment: 74 yo M on apixaban 5 mg BID for afib. Plan to go to cath lab on Monday for RHC and LHC. Pharmacy has been consulted to transition apixaban to heparin.  Last dose of apixaban was given on 4/9 @ 0940  aPTT this AM is therapeutic at 96 seconds. Heparin level is falsely elevated at 1.94 - as expected due to recent apixaban use.   Goal of Therapy:  Heparin level 0.3-0.7 units/ml aPTT 66-102 seconds Monitor platelets by anticoagulation protocol: Yes   Plan:  Continue IV heparin 1000 units/hr Daily heparin level, aPTT, and CBC F/u resuming apixaban after cath on  Monday  Vertis Kelch, PharmD, [facility] PGY2 Cardiology Pharmacy Resident Phone [phone] 07/27/2019       7:48 AM  Please check [email or web address] for unit-specific pharmacist phone numbers

## 2019-07-27 NOTE — TOC Progression Note (Signed)
Transition of Care [facility]) - Progression Note    Patient Details  Name: Jordan Ross MRN: [id number] Date of Birth: [birth year]  [facility]) CM/SW Contact  Carles Collet, RN Phone Number: 07/27/2019, 9:52 AM  Clinical Narrative:    [facility] is the only [facility] agency that will provide home inotrope support to the patient's area.  Referral accepted with a start of care either Monday or Tuesday.     Expected Discharge Plan: Home/Self Care Barriers to Discharge: Continued Medical Work up  Expected Discharge Plan and Services Expected Discharge Plan: Home/Self Care   Discharge Planning Services: CM Consult   Living arrangements for the past 2 months: Single Family Home                                       Social Determinants of Health (SDOH) Interventions    Readmission Risk Interventions No flowsheet data found.

## 2019-07-27 NOTE — Progress Notes (Signed)
Patient ID: Jordan Ross, male   DOB: Apr 08, 1946, 74 y.o.   MRN: [id number]    Advanced Heart Failure Rounding Note   Subjective:    Milrinone was stopped 07/24/19. CO-OX down to 43%, milrinone 0.125 mcg/kg/min restarted.  Co-ox 55% this morning with CVP 2-3.  Now has tunneled PICC.   Feels great, no dyspnea or lightheadedness.     Objective:   Weight Range:  Vital Signs:   Temp:  [97.8 F (36.6 C)-97.9 F (36.6 C)] 97.8 F (36.6 C) (04/10 0748) Pulse Rate:  [64-67] 65 (04/10 0748) Resp:  [16-24] 20 (04/10 0748) BP: (103-118)/(60-78) 103/60 (04/10 0748) SpO2:  [99 %-100 %] 100 % (04/10 0335) Weight:  [75.9 kg] 75.9 kg (04/10 0335) Last BM Date: 07/26/19  Weight change: Filed Weights   07/25/19 0450 07/26/19 0400 07/27/19 0335  Weight: 75.5 kg 76 kg 75.9 kg    Intake/Output:   Intake/Output Summary (Last 24 hours) at 07/27/2019 1102 Last data filed at 07/27/2019 0700 Gross per 24 hour  Intake 930 ml  Output 1500 ml  Net -570 ml     General: NAD Neck: No JVD, no thyromegaly or thyroid nodule.  Lungs: Clear to auscultation bilaterally with normal respiratory effort. CV: Nondisplaced PMI.  Heart regular S1/S2, no S3/S4, no murmur.  No peripheral edema.   Abdomen: Soft, nontender, no hepatosplenomegaly, no distention.  Skin: Intact without lesions or rashes.  Neurologic: Alert and oriented x 3.  Psych: Normal affect. Extremities: No clubbing or cyanosis.  HEENT: Normal.   Telemetry: NSR 70s   Labs: Basic Metabolic Panel: Recent Labs  Lab 07/21/19 0351 07/22/19 0425 07/23/19 0500 07/23/19 0500 07/24/19 0433 07/24/19 0433 07/25/19 0500 07/26/19 1444 07/27/19 0550  NA 140   < > 141  --  140  --  137 135 139  K 4.2   < > 3.9  --  4.1  --  4.8 4.6 4.3  CL 106   < > 106  --  106  --  104 104 108  CO2 26   < > 25  --  24  --  _0 GLUCOSE 132*   < > 72  --  133*  --  166* 279* 126*  BUN 14   < > 13  --  12  --  _1 CREATININE 1.37*   < > 1.23   --  1.17  --  1.28* 1.32* 1.38*  CALCIUM 9.3   < > 9.1   < > 9.0   < > 9.3 9.2 8.9  MG 1.7  --  1.7  --   --   --   --   --   --    < > = values in this interval not displayed.    Liver Function Tests: No results for input(s): AST, ALT, ALKPHOS, BILITOT, PROT, ALBUMIN in the last 168 hours. No results for input(s): LIPASE, AMYLASE in the last 168 hours. No results for input(s): AMMONIA in the last 168 hours.  CBC: Recent Labs  Lab 07/27/19 0550  WBC 6.2  HGB 11.3*  HCT 35.0*  MCV 95.1  PLT 273    Cardiac Enzymes: No results for input(s): CKTOTAL, CKMB, CKMBINDEX, TROPONINI in the last 168 hours.  BNP: BNP (last 3 results) Recent Labs    06/19/19 1511 07/12/19 1323 07/14/19 0142  BNP 1,000.0* 1,976.9* 1,176.1*    ProBNP (last 3 results) No results for input(s): PROBNP in the last 8760 hours.  Other results:  Imaging: IR Fluoro Guide CV Line Right  Result Date: 07/26/2019 INDICATION: CHF, chronic Milrinone access EXAM: TUNNELED PICC LINE WITH ULTRASOUND AND FLUOROSCOPIC GUIDANCE MEDICATIONS: 1% lidocaine local. ANESTHESIA/SEDATION: Moderate Sedation Time:  None. The patient was continuously monitored during the procedure by the interventional radiology nurse under my direct supervision. FLUOROSCOPY TIME:  Fluoroscopy Time: 0 minutes 18 seconds (1 mGy). COMPLICATIONS: None immediate. PROCEDURE: Informed written consent was obtained from the patient after a discussion of the risks, benefits, and alternatives to treatment. Questions regarding the procedure were encouraged and answered. The right neck and chest were prepped with chlorhexidine in a sterile fashion, and a sterile drape was applied covering the operative field. Maximum barrier sterile technique with sterile gowns and gloves were used for the procedure. A timeout was performed prior to the initiation of the procedure. After creating a small venotomy incision, a micropuncture kit was utilized to access the right  internal jugular vein under direct, real-time ultrasound guidance after the overlying soft tissues were anesthetized with 1% lidocaine with epinephrine. Ultrasound image documentation was performed. The microwire was kinked to measure appropriate catheter length. The micropuncture sheath was exchanged for a peel-away sheath over a guidewire. A 5 French dual lumen tunneled PICC measuring 27 cm was tunneled in a retrograde fashion from the anterior chest wall to the venotomy incision. The catheter was then placed through the peel-away sheath with tip ultimately positioned at the superior caval-atrial junction. Final catheter positioning was confirmed and documented with a spot radiographic image. The catheter aspirates and flushes normally. The catheter was flushed with appropriate volume heparin dwells. The catheter exit site was secured with a 2-0 Ethilon retention suture. The venotomy incision was closed with Dermabond. Dressings were applied. The patient tolerated the procedure well without immediate post procedural complication. FINDINGS: After catheter placement, the tip lies within the superior cavoatrial junction. The catheter aspirates and flushes normally and is ready for immediate use. IMPRESSION: Successful placement of 27cm dual lumen tunneled PICC catheter via the right internal jugular vein with tip terminating at the superior caval atrial junction. The catheter is ready for immediate use. Electronically Signed   By: Jerilynn Mages.  Shick M.D.   On: 07/26/2019 16:43   IR US Guide Vasc Access Right  Result Date: 07/26/2019 INDICATION: CHF, chronic Milrinone access EXAM: TUNNELED PICC LINE WITH ULTRASOUND AND FLUOROSCOPIC GUIDANCE MEDICATIONS: 1% lidocaine local. ANESTHESIA/SEDATION: Moderate Sedation Time:  None. The patient was continuously monitored during the procedure by the interventional radiology nurse under my direct supervision. FLUOROSCOPY TIME:  Fluoroscopy Time: 0 minutes 18 seconds (1 mGy).  COMPLICATIONS: None immediate. PROCEDURE: Informed written consent was obtained from the patient after a discussion of the risks, benefits, and alternatives to treatment. Questions regarding the procedure were encouraged and answered. The right neck and chest were prepped with chlorhexidine in a sterile fashion, and a sterile drape was applied covering the operative field. Maximum barrier sterile technique with sterile gowns and gloves were used for the procedure. A timeout was performed prior to the initiation of the procedure. After creating a small venotomy incision, a micropuncture kit was utilized to access the right internal jugular vein under direct, real-time ultrasound guidance after the overlying soft tissues were anesthetized with 1% lidocaine with epinephrine. Ultrasound image documentation was performed. The microwire was kinked to measure appropriate catheter length. The micropuncture sheath was exchanged for a peel-away sheath over a guidewire. A 5 French dual lumen tunneled PICC measuring 27 cm was tunneled in a retrograde  fashion from the anterior chest wall to the venotomy incision. The catheter was then placed through the peel-away sheath with tip ultimately positioned at the superior caval-atrial junction. Final catheter positioning was confirmed and documented with a spot radiographic image. The catheter aspirates and flushes normally. The catheter was flushed with appropriate volume heparin dwells. The catheter exit site was secured with a 2-0 Ethilon retention suture. The venotomy incision was closed with Dermabond. Dressings were applied. The patient tolerated the procedure well without immediate post procedural complication. FINDINGS: After catheter placement, the tip lies within the superior cavoatrial junction. The catheter aspirates and flushes normally and is ready for immediate use. IMPRESSION: Successful placement of 27cm dual lumen tunneled PICC catheter via the right internal jugular  vein with tip terminating at the superior caval atrial junction. The catheter is ready for immediate use. Electronically Signed   By: Jerilynn Mages.  Shick M.D.   On: 07/26/2019 16:43     Medications:     Scheduled Medications: . amiodarone  200 mg Oral BID  . aspirin  81 mg Oral Daily  . atorvastatin  40 mg Oral Daily  . Chlorhexidine Gluconate Cloth  6 each Topical Daily  . digoxin  0.0625 mg Oral Daily  . docusate sodium  100 mg Oral Daily  . insulin aspart  0-9 Units Subcutaneous TID WC  . insulin glargine  25 Units Subcutaneous QHS  . losartan  12.5 mg Oral BID  . pantoprazole  40 mg Oral Daily  . polyethylene glycol  17 g Oral Daily  . sodium chloride flush  10-40 mL Intracatheter Q12H  . sodium chloride flush  3 mL Intravenous Q12H  . spironolactone  25 mg Oral Daily    Infusions: . sodium chloride    . heparin 1,000 Units/hr (07/26/19 2138)  . milrinone 0.125 mcg/kg/min (07/26/19 1145)    PRN Medications: Place/Maintain arterial line **AND** sodium chloride, acetaminophen, albuterol, lidocaine (PF), nitroGLYCERIN, ondansetron (ZOFRAN) IV, sodium chloride flush, sorbitol   Assessment/Plan:   1. Acute on chronic systolic HF -> cardiogenic shock - due to mixed iCM/NICM - baseline EF 30-35% due to iCM. EF this admit 10-15% due to tachy-induced CM (atrial fibrillation with RVR).  - Initial co-ox 37% -> milrinone started 3/28 and titrated up to 0.375. - Off milrinone on  07/24/19. CO-OX 50% with repeat lower at 43%. Milrinone restarted at 0.125 on 4/9, co-ox 55% this morning.  - Looks like he will need to continue milrinone 0.125 mcg/kg/min.  Plan for continuing this at home with slow wean, hopefully can stop in future as stunning from tachycardia-mediated CMP resolves.  - CVP 2-3, no diuretic.  - Continue spiro 25 mg daily  - Continue digoxin 0.125 mg daily.  - Increase losartan to 12.5 mg bid, soft BP but if tolerates this may be able to eventually transition to [facility].  - no  b-blocker with low output - LBBB, will likely be eventual CRT candidate. He has ICD.   - Cath on Monday to rule out worsening CAD as contributor to worsening CHF.     2. Afib/flutler, Persistent - with RVR likely the source of worsening cardiomyopathy. - Maintaining NSR now on amiodarone, continue.  - Eliquis held for cath, restart afterwards. Cover with heparin gtt.   3. AKI - baseline creatinine ~1.2 - scr peaked to 2.1, suspect cardiorenal.  - 1.38 today.   4. CAD s/p CABG - s/p anterior MI in 2001 followed by CABG - no recent cath but denies angina -  continue medical therapy - Plan to cath prior to discharge to rule out worsening CAD as trigger for event.   Length of Stay: [place]  07/27/2019, 11:02 AM  Advanced Heart Failure Team Pager [phone] (M-F; 7a - 4p)  Please contact [facility] for night-coverage after hours (4p -7a ) and weekends on [email or web address]

## 2019-07-27 NOTE — Progress Notes (Signed)
CARDIAC REHAB PHASE I   PRE:  Rate/Rhythm: SR 66  BP:  Supine: 107/70    MODE:  Ambulation: 2500 ft   POST:  Rate/Rhythm: SR 75  BP:  Sitting: 129/93       0920-1000  Pt eager to walk this morning stating he feels good.  Pt sa on EOB and took daily medications provided by bedside RN.  Pt ambulated 2500 ft with no c/o.  Ambulated pt outside.  Pt was glad to be able to go outside.     Noel Christmas, RN 07/27/2019 10:28 AM

## 2019-07-28 LAB — CBC
HCT: 38.3 % — ABNORMAL LOW (ref 39.0–52.0)
Hemoglobin: 12.1 g/dL — ABNORMAL LOW (ref 13.0–17.0)
MCH: 30 pg (ref 26.0–34.0)
MCHC: 31.6 g/dL (ref 30.0–36.0)
MCV: 94.8 fL (ref 80.0–100.0)
Platelets: 319 10*3/uL (ref 150–400)
RBC: 4.04 MIL/uL — ABNORMAL LOW (ref 4.22–5.81)
RDW: 14.8 % (ref 11.5–15.5)
WBC: 5.5 10*3/uL (ref 4.0–10.5)
nRBC: 0 % (ref 0.0–0.2)

## 2019-07-28 LAB — COOXEMETRY PANEL
Carboxyhemoglobin: 1.2 % (ref 0.5–1.5)
Methemoglobin: 1.2 % (ref 0.0–1.5)
O2 Saturation: 60.2 %
Total hemoglobin: 15.2 g/dL (ref 12.0–16.0)

## 2019-07-28 LAB — BASIC METABOLIC PANEL
Anion gap: 9 (ref 5–15)
BUN: 14 mg/dL (ref 8–23)
CO2: 26 mmol/L (ref 22–32)
Calcium: 9.8 mg/dL (ref 8.9–10.3)
Chloride: 104 mmol/L (ref 98–111)
Creatinine, Ser: 1.44 mg/dL — ABNORMAL HIGH (ref 0.61–1.24)
GFR calc Af Amer: 56 mL/min — ABNORMAL LOW (ref >60)
GFR calc non Af Amer: 49 mL/min — ABNORMAL LOW (ref >60)
Glucose, Bld: 132 mg/dL — ABNORMAL HIGH (ref 70–99)
Potassium: 4.8 mmol/L (ref 3.5–5.1)
Sodium: 139 mmol/L (ref 135–145)

## 2019-07-28 LAB — GLUCOSE, CAPILLARY
Glucose-Capillary: 96 mg/dL (ref 70–99)
Glucose-Capillary: 115 mg/dL — ABNORMAL HIGH (ref 70–99)
Glucose-Capillary: 232 mg/dL — ABNORMAL HIGH (ref 70–99)
Glucose-Capillary: 190 mg/dL — ABNORMAL HIGH (ref 70–99)

## 2019-07-28 LAB — HEPARIN LEVEL (UNFRACTIONATED): Heparin Unfractionated: 1.08 IU/mL — ABNORMAL HIGH (ref 0.30–0.70)

## 2019-07-28 LAB — APTT
aPTT: 109 seconds — ABNORMAL HIGH (ref 24–36)
aPTT: 113 seconds — ABNORMAL HIGH (ref 24–36)

## 2019-07-28 MED ORDER — SODIUM CHLORIDE 0.9 % IV SOLN: INTRAVENOUS | Status: AC

## 2019-07-28 MED ORDER — SODIUM CHLORIDE 0.9% FLUSH: 3.0000 mL | INTRAVENOUS | Status: DC | PRN

## 2019-07-28 MED ORDER — ASPIRIN 81 MG PO CHEW: 81.0000 mg | CHEWABLE_TABLET | Freq: Every day | ORAL | Status: DC

## 2019-07-28 MED ORDER — SODIUM CHLORIDE 0.9 % IV SOLN: 250.0000 mL | INTRAVENOUS | Status: DC | PRN

## 2019-07-28 MED ORDER — ASPIRIN 81 MG PO CHEW
81.0000 mg | CHEWABLE_TABLET | ORAL | Status: AC
  Administered 2019-07-29: 81 mg via ORAL
  Filled 2019-07-28: qty 1

## 2019-07-28 MED ORDER — SODIUM CHLORIDE 0.9 % IV SOLN: INTRAVENOUS | Status: DC

## 2019-07-28 NOTE — Progress Notes (Signed)
ANTICOAGULATION CONSULT NOTE - Follow Up Consult  Pharmacy Consult for heparin Indication: atrial fibrillation  No Known Allergies  Patient Measurements: Height: 6' (182.9 cm) Weight: 75.6 kg (166 lb 10.7 oz) IBW/kg (Calculated) : 77.6 Heparin Dosing Weight: 76.2 kg  Vital Signs: Temp: 97.8 F (36.6 C) (04/11 0500) Temp Source: Oral (04/11 0500) BP: 106/65 (04/11 0515) Pulse Rate: 74 (04/11 0500)  Labs: Recent Labs    07/26/19 1444 07/27/19 0550 07/27/19 0800 07/28/19 0429  HGB  --  11.3*  --  12.1*  HCT  --  35.0*  --  38.3*  PLT  --  273  --  319  APTT  --   --  96* 109*  HEPARINUNFRC  --   --  1.94* 1.08*  CREATININE 1.32* 1.38*  --  1.44*    Estimated Creatinine Clearance: 50.3 mL/min (A) (by C-G formula based on SCr of 1.44 mg/dL (H)).   Medical History: Past Medical History:  Diagnosis Date  . Abdominal aortic aneurysm ([place])   . AICD (automatic cardioverter/defibrillator) present    [employer] Jude  . Anemia    iron deficiency anemia,takes iron pill daily  . Arthritis   . Coronary artery disease    AWMI 2001  . Diverticulosis   . GERD (gastroesophageal reflux disease)    takes Protonix daily  . History of colon polyps    benign  . History of migraine 40 yrs ago  . History of shingles   . Hyperlipidemia    takes Atorvastatin and Niacin daily  . Hypertension    takes Diovan,Spironolactone, and Carvedilol daily  . ICD (implantable cardioverter-defibrillator), single, in situ   . Ischemic cardiomyopathy   . Joint swelling   . LV dysfunction    takes Digoxin daily  . Myocardial infarction ([place]) 2001  . Pneumonia 40 yrs ago   hx of  . Shortness of breath dyspnea    with exertion.States he was a smoker for yr  . Status post coronary artery bypass grafting   . Type 2 diabetes mellitus ([facility])    takes Glipizide,Metformin,Invokana,and Lantus daily.Average fasting blood sugar runs about 140    Medications:  Scheduled:  . amiodarone  200 mg Oral BID  .  aspirin  81 mg Oral Daily  . atorvastatin  40 mg Oral Daily  . Chlorhexidine Gluconate Cloth  6 each Topical Daily  . digoxin  0.0625 mg Oral Daily  . docusate sodium  100 mg Oral Daily  . insulin aspart  0-9 Units Subcutaneous TID WC  . insulin glargine  25 Units Subcutaneous QHS  . losartan  12.5 mg Oral BID  . pantoprazole  40 mg Oral Daily  . polyethylene glycol  17 g Oral Daily  . sodium chloride flush  10-40 mL Intracatheter Q12H  . sodium chloride flush  3 mL Intravenous Q12H  . spironolactone  25 mg Oral Daily   Infusions:  . sodium chloride    . heparin 1,000 Units/hr (07/27/19 1600)  . milrinone 0.125 mcg/kg/min (07/27/19 2003)    Assessment: 74 yo M on apixaban 5 mg BID for afib. Plan to go to cath lab on Monday for RHC and LHC. Pharmacy has been consulted to transition apixaban to heparin.  Last dose of apixaban was given on 4/9 @ 0940  aPTT this AM is slightly supratherapeutic at 109 seconds. Heparin level is elevated at 1.08 - as expected due to recent apixaban use, but anticipate correlating with aPTT soon.   Goal of Therapy:  Heparin level 0.3-0.7 units/ml aPTT 66-102 seconds Monitor platelets by anticoagulation protocol: Yes   Plan:  Decrease IV heparin to 950 units/hr Check aPTT in 8 hours Daily heparin level, aPTT, and CBC F/u resuming apixaban after cath on Monday  Vertis Kelch, PharmD, [facility] PGY2 Cardiology Pharmacy Resident Phone [phone] 07/28/2019       7:48 AM  Please check [email or web address] for unit-specific pharmacist phone numbers

## 2019-07-28 NOTE — Progress Notes (Signed)
ANTICOAGULATION CONSULT NOTE - Follow Up Consult  Pharmacy Consult for heparin Indication: atrial fibrillation  No Known Allergies  Patient Measurements: Height: 6' (182.9 cm) Weight: 75.6 kg (166 lb 10.7 oz) IBW/kg (Calculated) : 77.6 Heparin Dosing Weight: 76.2 kg  Vital Signs: Temp: 97.8 F (36.6 C) (04/11 1606) Temp Source: Oral (04/11 1606) BP: 103/62 (04/11 1606) Pulse Rate: 65 (04/11 1606)  Labs: Recent Labs    07/26/19 1444 07/27/19 0550 07/27/19 0800 07/28/19 0429 07/28/19 1536  HGB  --  11.3*  --  12.1*  --   HCT  --  35.0*  --  38.3*  --   PLT  --  273  --  319  --   APTT  --   --  96* 109* 113*  HEPARINUNFRC  --   --  1.94* 1.08*  --   CREATININE 1.32* 1.38*  --  1.44*  --     Estimated Creatinine Clearance: 50.3 mL/min (A) (by C-G formula based on SCr of 1.44 mg/dL (H)).   Medical History: Past Medical History:  Diagnosis Date  . Abdominal aortic aneurysm ([place])   . AICD (automatic cardioverter/defibrillator) present    [employer] Jude  . Anemia    iron deficiency anemia,takes iron pill daily  . Arthritis   . Coronary artery disease    AWMI 2001  . Diverticulosis   . GERD (gastroesophageal reflux disease)    takes Protonix daily  . History of colon polyps    benign  . History of migraine 40 yrs ago  . History of shingles   . Hyperlipidemia    takes Atorvastatin and Niacin daily  . Hypertension    takes Diovan,Spironolactone, and Carvedilol daily  . ICD (implantable cardioverter-defibrillator), single, in situ   . Ischemic cardiomyopathy   . Joint swelling   . LV dysfunction    takes Digoxin daily  . Myocardial infarction ([place]) 2001  . Pneumonia 40 yrs ago   hx of  . Shortness of breath dyspnea    with exertion.States he was a smoker for yr  . Status post coronary artery bypass grafting   . Type 2 diabetes mellitus (HCC)    takes Glipizide,Metformin,Invokana,and Lantus daily.Average fasting blood sugar runs about 140    Medications:   Scheduled:  . amiodarone  200 mg Oral BID  . aspirin  81 mg Oral Daily  . atorvastatin  40 mg Oral Daily  . Chlorhexidine Gluconate Cloth  6 each Topical Daily  . digoxin  0.0625 mg Oral Daily  . docusate sodium  100 mg Oral Daily  . insulin aspart  0-9 Units Subcutaneous TID WC  . insulin glargine  25 Units Subcutaneous QHS  . losartan  12.5 mg Oral BID  . pantoprazole  40 mg Oral Daily  . polyethylene glycol  17 g Oral Daily  . sodium chloride flush  10-40 mL Intracatheter Q12H  . sodium chloride flush  3 mL Intravenous Q12H  . spironolactone  25 mg Oral Daily   Infusions:  . sodium chloride    . [START ON 07/29/2019] sodium chloride    . heparin 950 Units/hr (07/28/19 1200)  . milrinone 0.125 mcg/kg/min (07/28/19 1200)    Assessment: 74 yo M on apixaban 5 mg BID for afib. Plan to go to cath lab on Monday for RHC and LHC. Pharmacy has been consulted to transition apixaban to heparin.  Last dose of apixaban was given on 4/9 @ 0940  aPTT this afternoon remains slightly supratherapeutic at 113  seconds. Heparin level this morning was elevated at 1.08 - as expected due to recent apixaban use, but anticipate correlating with aPTT soon. Drawn appropriately per nursing. No s/sx of bleeding or infusion issues per nursing.   Goal of Therapy:  Heparin level 0.3-0.7 units/ml aPTT 66-102 seconds Monitor platelets by anticoagulation protocol: Yes   Plan:  Decrease IV heparin to 800 units/hr Check aPTT in 8 hours Daily heparin level, aPTT, and CBC F/u resuming apixaban after cath on Monday  Antonietta Jewel, PharmD, [name] Pharmacist  Phone: [phone] 07/28/2019 4:26 PM  Please check AMION for all [place] phone numbers After 10:00 PM, call [place] [phone]

## 2019-07-28 NOTE — Progress Notes (Signed)
Patient ID: Jordan Ross, male   DOB: 12-10-1945, 74 y.o.   MRN: [id number]    Advanced Heart Failure Rounding Note   Subjective:    Milrinone was stopped 07/24/19. CO-OX down to 43%, milrinone 0.125 mcg/kg/min restarted.  Co-ox 60% this morning with CVP 8.  Now has tunneled PICC.   Feels great, no dyspnea or lightheadedness.     Objective:   Weight Range:  Vital Signs:   Temp:  [97.6 F (36.4 C)-98.2 F (36.8 C)] 98.2 F (36.8 C) (04/11 0752) Pulse Rate:  [67-74] 74 (04/11 0500) Resp:  [11-26] 17 (04/11 0603) BP: (86-123)/(52-87) 113/74 (04/11 0752) SpO2:  [100 %] 100 % (04/11 0752) Weight:  [75.6 kg] 75.6 kg (04/11 0500) Last BM Date: 07/26/19  Weight change: Filed Weights   07/26/19 0400 07/27/19 0335 07/28/19 0500  Weight: 76 kg 75.9 kg 75.6 kg    Intake/Output:   Intake/Output Summary (Last 24 hours) at 07/28/2019 1009 Last data filed at 07/28/2019 0754 Gross per 24 hour  Intake 1332.93 ml  Output 500 ml  Net 832.93 ml     General: NAD Neck: No JVD, no thyromegaly or thyroid nodule.  Lungs: Clear to auscultation bilaterally with normal respiratory effort. CV: Nondisplaced PMI.  Heart regular S1/S2, no S3/S4, no murmur.  No peripheral edema.   Abdomen: Soft, nontender, no hepatosplenomegaly, no distention.  Skin: Intact without lesions or rashes.  Neurologic: Alert and oriented x 3.  Psych: Normal affect. Extremities: No clubbing or cyanosis.  HEENT: Normal.   Telemetry: NSR 70s   Labs: Basic Metabolic Panel: Recent Labs  Lab 07/23/19 0500 07/23/19 0500 07/24/19 0433 07/24/19 0433 07/25/19 0500 07/25/19 0500 07/26/19 1444 07/27/19 0550 07/28/19 0429  NA 141   < > 140  --  137  --  135 139 139  K 3.9   < > 4.1  --  4.8  --  4.6 4.3 4.8  CL 106   < > 106  --  104  --  104 108 104  CO2 25   < > 24  --  24  --  '23 24 26  ' GLUCOSE 72   < > 133*  --  166*  --  279* 126* 132*  BUN 13   < > 12  --  14  --  '18 16 14  ' CREATININE 1.23   < > 1.17  --   1.28*  --  1.32* 1.38* 1.44*  CALCIUM 9.1   < > 9.0   < > 9.3   < > 9.2 8.9 9.8  MG 1.7  --   --   --   --   --   --   --   --    < > = values in this interval not displayed.    Liver Function Tests: No results for input(s): AST, ALT, ALKPHOS, BILITOT, PROT, ALBUMIN in the last 168 hours. No results for input(s): LIPASE, AMYLASE in the last 168 hours. No results for input(s): AMMONIA in the last 168 hours.  CBC: Recent Labs  Lab 07/27/19 0550 07/28/19 0429  WBC 6.2 5.5  HGB 11.3* 12.1*  HCT 35.0* 38.3*  MCV 95.1 94.8  PLT 273 319    Cardiac Enzymes: No results for input(s): CKTOTAL, CKMB, CKMBINDEX, TROPONINI in the last 168 hours.  BNP: BNP (last 3 results) Recent Labs    06/19/19 1511 07/12/19 1323 07/14/19 0142  BNP 1,000.0* 1,976.9* 1,176.1*    ProBNP (last 3 results) No results  for input(s): PROBNP in the last 8760 hours.    Other results:  Imaging: IR Fluoro Guide CV Line Right  Result Date: 07/26/2019 INDICATION: CHF, chronic Milrinone access EXAM: TUNNELED PICC LINE WITH ULTRASOUND AND FLUOROSCOPIC GUIDANCE MEDICATIONS: 1% lidocaine local. ANESTHESIA/SEDATION: Moderate Sedation Time:  None. The patient was continuously monitored during the procedure by the interventional radiology nurse under my direct supervision. FLUOROSCOPY TIME:  Fluoroscopy Time: 0 minutes 18 seconds (1 mGy). COMPLICATIONS: None immediate. PROCEDURE: Informed written consent was obtained from the patient after a discussion of the risks, benefits, and alternatives to treatment. Questions regarding the procedure were encouraged and answered. The right neck and chest were prepped with chlorhexidine in a sterile fashion, and a sterile drape was applied covering the operative field. Maximum barrier sterile technique with sterile gowns and gloves were used for the procedure. A timeout was performed prior to the initiation of the procedure. After creating a small venotomy incision, a micropuncture  kit was utilized to access the right internal jugular vein under direct, real-time ultrasound guidance after the overlying soft tissues were anesthetized with 1% lidocaine with epinephrine. Ultrasound image documentation was performed. The microwire was kinked to measure appropriate catheter length. The micropuncture sheath was exchanged for a peel-away sheath over a guidewire. A 5 French dual lumen tunneled PICC measuring 27 cm was tunneled in a retrograde fashion from the anterior chest wall to the venotomy incision. The catheter was then placed through the peel-away sheath with tip ultimately positioned at the superior caval-atrial junction. Final catheter positioning was confirmed and documented with a spot radiographic image. The catheter aspirates and flushes normally. The catheter was flushed with appropriate volume heparin dwells. The catheter exit site was secured with a 2-0 Ethilon retention suture. The venotomy incision was closed with Dermabond. Dressings were applied. The patient tolerated the procedure well without immediate post procedural complication. FINDINGS: After catheter placement, the tip lies within the superior cavoatrial junction. The catheter aspirates and flushes normally and is ready for immediate use. IMPRESSION: Successful placement of 27cm dual lumen tunneled PICC catheter via the right internal jugular vein with tip terminating at the superior caval atrial junction. The catheter is ready for immediate use. Electronically Signed   By: Jerilynn Mages.  Shick M.D.   On: 07/26/2019 16:43   IR US Guide Vasc Access Right  Result Date: 07/26/2019 INDICATION: CHF, chronic Milrinone access EXAM: TUNNELED PICC LINE WITH ULTRASOUND AND FLUOROSCOPIC GUIDANCE MEDICATIONS: 1% lidocaine local. ANESTHESIA/SEDATION: Moderate Sedation Time:  None. The patient was continuously monitored during the procedure by the interventional radiology nurse under my direct supervision. FLUOROSCOPY TIME:  Fluoroscopy Time: 0  minutes 18 seconds (1 mGy). COMPLICATIONS: None immediate. PROCEDURE: Informed written consent was obtained from the patient after a discussion of the risks, benefits, and alternatives to treatment. Questions regarding the procedure were encouraged and answered. The right neck and chest were prepped with chlorhexidine in a sterile fashion, and a sterile drape was applied covering the operative field. Maximum barrier sterile technique with sterile gowns and gloves were used for the procedure. A timeout was performed prior to the initiation of the procedure. After creating a small venotomy incision, a micropuncture kit was utilized to access the right internal jugular vein under direct, real-time ultrasound guidance after the overlying soft tissues were anesthetized with 1% lidocaine with epinephrine. Ultrasound image documentation was performed. The microwire was kinked to measure appropriate catheter length. The micropuncture sheath was exchanged for a peel-away sheath over a guidewire. A 5 [place] dual  lumen tunneled PICC measuring 27 cm was tunneled in a retrograde fashion from the anterior chest wall to the venotomy incision. The catheter was then placed through the peel-away sheath with tip ultimately positioned at the superior caval-atrial junction. Final catheter positioning was confirmed and documented with a spot radiographic image. The catheter aspirates and flushes normally. The catheter was flushed with appropriate volume heparin dwells. The catheter exit site was secured with a 2-0 Ethilon retention suture. The venotomy incision was closed with Dermabond. Dressings were applied. The patient tolerated the procedure well without immediate post procedural complication. FINDINGS: After catheter placement, the tip lies within the superior cavoatrial junction. The catheter aspirates and flushes normally and is ready for immediate use. IMPRESSION: Successful placement of 27cm dual lumen tunneled PICC catheter  via the right internal jugular vein with tip terminating at the superior caval atrial junction. The catheter is ready for immediate use. Electronically Signed   By: Jerilynn Mages.  Shick M.D.   On: 07/26/2019 16:43     Medications:     Scheduled Medications: . amiodarone  200 mg Oral BID  . aspirin  81 mg Oral Daily  . atorvastatin  40 mg Oral Daily  . Chlorhexidine Gluconate Cloth  6 each Topical Daily  . digoxin  0.0625 mg Oral Daily  . docusate sodium  100 mg Oral Daily  . insulin aspart  0-9 Units Subcutaneous TID WC  . insulin glargine  25 Units Subcutaneous QHS  . losartan  12.5 mg Oral BID  . pantoprazole  40 mg Oral Daily  . polyethylene glycol  17 g Oral Daily  . sodium chloride flush  10-40 mL Intracatheter Q12H  . sodium chloride flush  3 mL Intravenous Q12H  . spironolactone  25 mg Oral Daily    Infusions: . sodium chloride    . heparin 950 Units/hr (07/28/19 0754)  . milrinone 0.125 mcg/kg/min (07/27/19 2003)    PRN Medications: Place/Maintain arterial line **AND** sodium chloride, acetaminophen, albuterol, lidocaine (PF), nitroGLYCERIN, ondansetron (ZOFRAN) IV, sodium chloride flush, sorbitol   Assessment/Plan:   1. Acute on chronic systolic HF -> cardiogenic shock - due to mixed iCM/NICM - baseline EF 30-35% due to iCM. EF this admit 10-15% due to tachy-induced CM (atrial fibrillation with RVR).  - Initial co-ox 37% -> milrinone started 3/28 and titrated up to 0.375. - Off milrinone on  07/24/19. CO-OX 50% with repeat lower at 43%. Milrinone restarted at 0.125 on 4/9, co-ox 60% this morning.  - Looks like he will need to continue milrinone 0.125 mcg/kg/min.  Plan for continuing this at home with slow wean, hopefully can stop in future as stunning from tachycardia-mediated CMP resolves.  - CVP 8, no diuretic today.  - Continue spiro 25 mg daily  - Continue digoxin 0.125 mg daily.  - Continue losartan 12.5 mg bid, SBP generally in 100s but may be able to eventually  transition to [employer].  - no b-blocker with low output - LBBB, will likely be eventual CRT candidate. He has ICD.   - Cath on Monday to rule out worsening CAD as contributor to worsening CHF.     2. Afib/flutler, Persistent - with RVR likely the source of worsening cardiomyopathy. - Maintaining NSR now on amiodarone, continue.  - Eliquis held for cath, restart afterwards. Cover with heparin gtt.   3. AKI - baseline creatinine ~1.2 - scr peaked to 2.1, suspect cardiorenal.  - 1.44 today.   4. CAD s/p CABG - s/p anterior MI in 2001 followed  by CABG - no recent cath but denies angina - continue medical therapy - Plan to cath prior to discharge to rule out worsening CAD as trigger for event. Discussed risks/benefits with patient and he agrees to procedure.  Will give some gentle hydration on the morning of cath.   Length of Stay: [place]  07/28/2019, 10:09 AM  Advanced Heart Failure Team Pager [phone] (M-F; 7a - 4p)  Please contact [facility] for night-coverage after hours (4p -7a ) and weekends on [email or web address]

## 2019-07-28 NOTE — H&P (View-Only) (Signed)
Patient ID: Jordan Ross, male   DOB: [birth year], 74 y.o.   MRN: [id number]    Advanced Heart Failure Rounding Note   Subjective:    Milrinone was stopped 07/24/19. CO-OX down to 43%, milrinone 0.125 mcg/kg/min restarted.  Co-ox 60% this morning with CVP 8.  Now has tunneled PICC.   Feels great, no dyspnea or lightheadedness.     Objective:   Weight Range:  Vital Signs:   Temp:  [97.6 F (36.4 C)-98.2 F (36.8 C)] 98.2 F (36.8 C) (04/11 0752) Pulse Rate:  [67-74] 74 (04/11 0500) Resp:  [11-26] 17 (04/11 0603) BP: (86-123)/(52-87) 113/74 (04/11 0752) SpO2:  [100 %] 100 % (04/11 0752) Weight:  [75.6 kg] 75.6 kg (04/11 0500) Last BM Date: 07/26/19  Weight change: Filed Weights   07/26/19 0400 07/27/19 0335 07/28/19 0500  Weight: 76 kg 75.9 kg 75.6 kg    Intake/Output:   Intake/Output Summary (Last 24 hours) at 07/28/2019 1009 Last data filed at 07/28/2019 0754 Gross per 24 hour  Intake 1332.93 ml  Output 500 ml  Net 832.93 ml     General: NAD Neck: No JVD, no thyromegaly or thyroid nodule.  Lungs: Clear to auscultation bilaterally with normal respiratory effort. CV: Nondisplaced PMI.  Heart regular S1/S2, no S3/S4, no murmur.  No peripheral edema.   Abdomen: Soft, nontender, no hepatosplenomegaly, no distention.  Skin: Intact without lesions or rashes.  Neurologic: Alert and oriented x 3.  Psych: Normal affect. Extremities: No clubbing or cyanosis.  HEENT: Normal.   Telemetry: NSR 70s   Labs: Basic Metabolic Panel: Recent Labs  Lab 07/23/19 0500 07/23/19 0500 07/24/19 0433 07/24/19 0433 07/25/19 0500 07/25/19 0500 07/26/19 1444 07/27/19 0550 07/28/19 0429  NA 141   < > 140  --  137  --  135 139 139  K 3.9   < > 4.1  --  4.8  --  4.6 4.3 4.8  CL 106   < > 106  --  104  --  104 108 104  CO2 25   < > 24  --  24  --  '23 24 26  ' GLUCOSE 72   < > 133*  --  166*  --  279* 126* 132*  BUN 13   < > 12  --  14  --  '18 16 14  ' CREATININE 1.23   < > 1.17  --   1.28*  --  1.32* 1.38* 1.44*  CALCIUM 9.1   < > 9.0   < > 9.3   < > 9.2 8.9 9.8  MG 1.7  --   --   --   --   --   --   --   --    < > = values in this interval not displayed.    Liver Function Tests: No results for input(s): AST, ALT, ALKPHOS, BILITOT, PROT, ALBUMIN in the last 168 hours. No results for input(s): LIPASE, AMYLASE in the last 168 hours. No results for input(s): AMMONIA in the last 168 hours.  CBC: Recent Labs  Lab 07/27/19 0550 07/28/19 0429  WBC 6.2 5.5  HGB 11.3* 12.1*  HCT 35.0* 38.3*  MCV 95.1 94.8  PLT 273 319    Cardiac Enzymes: No results for input(s): CKTOTAL, CKMB, CKMBINDEX, TROPONINI in the last 168 hours.  BNP: BNP (last 3 results) Recent Labs    06/19/19 1511 07/12/19 1323 07/14/19 0142  BNP 1,000.0* 1,976.9* 1,176.1*    ProBNP (last 3 results) No results  for input(s): PROBNP in the last 8760 hours.    Other results:  Imaging: IR Fluoro Guide CV Line Right  Result Date: 07/26/2019 INDICATION: CHF, chronic Milrinone access EXAM: TUNNELED PICC LINE WITH ULTRASOUND AND FLUOROSCOPIC GUIDANCE MEDICATIONS: 1% lidocaine local. ANESTHESIA/SEDATION: Moderate Sedation Time:  None. The patient was continuously monitored during the procedure by the interventional radiology nurse under my direct supervision. FLUOROSCOPY TIME:  Fluoroscopy Time: 0 minutes 18 seconds (1 mGy). COMPLICATIONS: None immediate. PROCEDURE: Informed written consent was obtained from the patient after a discussion of the risks, benefits, and alternatives to treatment. Questions regarding the procedure were encouraged and answered. The right neck and chest were prepped with chlorhexidine in a sterile fashion, and a sterile drape was applied covering the operative field. Maximum barrier sterile technique with sterile gowns and gloves were used for the procedure. A timeout was performed prior to the initiation of the procedure. After creating a small venotomy incision, a micropuncture  kit was utilized to access the right internal jugular vein under direct, real-time ultrasound guidance after the overlying soft tissues were anesthetized with 1% lidocaine with epinephrine. Ultrasound image documentation was performed. The microwire was kinked to measure appropriate catheter length. The micropuncture sheath was exchanged for a peel-away sheath over a guidewire. A 5 French dual lumen tunneled PICC measuring 27 cm was tunneled in a retrograde fashion from the anterior chest wall to the venotomy incision. The catheter was then placed through the peel-away sheath with tip ultimately positioned at the superior caval-atrial junction. Final catheter positioning was confirmed and documented with a spot radiographic image. The catheter aspirates and flushes normally. The catheter was flushed with appropriate volume heparin dwells. The catheter exit site was secured with a 2-0 Ethilon retention suture. The venotomy incision was closed with Dermabond. Dressings were applied. The patient tolerated the procedure well without immediate post procedural complication. FINDINGS: After catheter placement, the tip lies within the superior cavoatrial junction. The catheter aspirates and flushes normally and is ready for immediate use. IMPRESSION: Successful placement of 27cm dual lumen tunneled PICC catheter via the right internal jugular vein with tip terminating at the superior caval atrial junction. The catheter is ready for immediate use. Electronically Signed   By: Jerilynn Mages.  Shick M.D.   On: 07/26/2019 16:43   IR US Guide Vasc Access Right  Result Date: 07/26/2019 INDICATION: CHF, chronic Milrinone access EXAM: TUNNELED PICC LINE WITH ULTRASOUND AND FLUOROSCOPIC GUIDANCE MEDICATIONS: 1% lidocaine local. ANESTHESIA/SEDATION: Moderate Sedation Time:  None. The patient was continuously monitored during the procedure by the interventional radiology nurse under my direct supervision. FLUOROSCOPY TIME:  Fluoroscopy Time: 0  minutes 18 seconds (1 mGy). COMPLICATIONS: None immediate. PROCEDURE: Informed written consent was obtained from the patient after a discussion of the risks, benefits, and alternatives to treatment. Questions regarding the procedure were encouraged and answered. The right neck and chest were prepped with chlorhexidine in a sterile fashion, and a sterile drape was applied covering the operative field. Maximum barrier sterile technique with sterile gowns and gloves were used for the procedure. A timeout was performed prior to the initiation of the procedure. After creating a small venotomy incision, a micropuncture kit was utilized to access the right internal jugular vein under direct, real-time ultrasound guidance after the overlying soft tissues were anesthetized with 1% lidocaine with epinephrine. Ultrasound image documentation was performed. The microwire was kinked to measure appropriate catheter length. The micropuncture sheath was exchanged for a peel-away sheath over a guidewire. A 5 [place] dual  lumen tunneled PICC measuring 27 cm was tunneled in a retrograde fashion from the anterior chest wall to the venotomy incision. The catheter was then placed through the peel-away sheath with tip ultimately positioned at the superior caval-atrial junction. Final catheter positioning was confirmed and documented with a spot radiographic image. The catheter aspirates and flushes normally. The catheter was flushed with appropriate volume heparin dwells. The catheter exit site was secured with a 2-0 Ethilon retention suture. The venotomy incision was closed with Dermabond. Dressings were applied. The patient tolerated the procedure well without immediate post procedural complication. FINDINGS: After catheter placement, the tip lies within the superior cavoatrial junction. The catheter aspirates and flushes normally and is ready for immediate use. IMPRESSION: Successful placement of 27cm dual lumen tunneled PICC catheter  via the right internal jugular vein with tip terminating at the superior caval atrial junction. The catheter is ready for immediate use. Electronically Signed   By: Jerilynn Mages.  Shick M.D.   On: 07/26/2019 16:43     Medications:     Scheduled Medications: . amiodarone  200 mg Oral BID  . aspirin  81 mg Oral Daily  . atorvastatin  40 mg Oral Daily  . Chlorhexidine Gluconate Cloth  6 each Topical Daily  . digoxin  0.0625 mg Oral Daily  . docusate sodium  100 mg Oral Daily  . insulin aspart  0-9 Units Subcutaneous TID WC  . insulin glargine  25 Units Subcutaneous QHS  . losartan  12.5 mg Oral BID  . pantoprazole  40 mg Oral Daily  . polyethylene glycol  17 g Oral Daily  . sodium chloride flush  10-40 mL Intracatheter Q12H  . sodium chloride flush  3 mL Intravenous Q12H  . spironolactone  25 mg Oral Daily    Infusions: . sodium chloride    . heparin 950 Units/hr (07/28/19 0754)  . milrinone 0.125 mcg/kg/min (07/27/19 2003)    PRN Medications: Place/Maintain arterial line **AND** sodium chloride, acetaminophen, albuterol, lidocaine (PF), nitroGLYCERIN, ondansetron (ZOFRAN) IV, sodium chloride flush, sorbitol   Assessment/Plan:   1. Acute on chronic systolic HF -> cardiogenic shock - due to mixed iCM/NICM - baseline EF 30-35% due to iCM. EF this admit 10-15% due to tachy-induced CM (atrial fibrillation with RVR).  - Initial co-ox 37% -> milrinone started 3/28 and titrated up to 0.375. - Off milrinone on  07/24/19. CO-OX 50% with repeat lower at 43%. Milrinone restarted at 0.125 on 4/9, co-ox 60% this morning.  - Looks like he will need to continue milrinone 0.125 mcg/kg/min.  Plan for continuing this at home with slow wean, hopefully can stop in future as stunning from tachycardia-mediated CMP resolves.  - CVP 8, no diuretic today.  - Continue spiro 25 mg daily  - Continue digoxin 0.125 mg daily.  - Continue losartan 12.5 mg bid, SBP generally in 100s but may be able to eventually  transition to [facility].  - no b-blocker with low output - LBBB, will likely be eventual CRT candidate. He has ICD.   - Cath on Monday to rule out worsening CAD as contributor to worsening CHF.     2. Afib/flutler, Persistent - with RVR likely the source of worsening cardiomyopathy. - Maintaining NSR now on amiodarone, continue.  - Eliquis held for cath, restart afterwards. Cover with heparin gtt.   3. AKI - baseline creatinine ~1.2 - scr peaked to 2.1, suspect cardiorenal.  - 1.44 today.   4. CAD s/p CABG - s/p anterior MI in 2001 followed  by CABG - no recent cath but denies angina - continue medical therapy - Plan to cath prior to discharge to rule out worsening CAD as trigger for event. Discussed risks/benefits with patient and he agrees to procedure.  Will give some gentle hydration on the morning of cath.   Length of Stay: [place]  07/28/2019, 10:09 AM  Advanced Heart Failure Team Pager [phone] (M-F; 7a - 4p)  Please contact [facility] for night-coverage after hours (4p -7a ) and weekends on [email or web address]

## 2019-07-29 ENCOUNTER — Encounter (HOSPITAL_COMMUNITY)
Admission: EM | Disposition: A | Discharge status: Home Health | Payer: Self-pay | Source: Ambulatory Visit | Attending: Internal Medicine

## 2019-07-29 DIAGNOSIS — L8990 Pressure ulcer of unspecified site, unspecified stage: Secondary | ICD-10-CM | POA: Insufficient documentation

## 2019-07-29 DIAGNOSIS — R570 Cardiogenic shock: Secondary | ICD-10-CM | POA: Diagnosis not present

## 2019-07-29 DIAGNOSIS — I5022 Chronic systolic (congestive) heart failure: Secondary | ICD-10-CM | POA: Diagnosis not present

## 2019-07-29 HISTORY — PX: LEFT HEART CATH AND CORS/GRAFTS ANGIOGRAPHY: CATH118250

## 2019-07-29 LAB — BASIC METABOLIC PANEL
Anion gap: 7 (ref 5–15)
Anion gap: 4 — ABNORMAL LOW (ref 5–15)
BUN: 14 mg/dL (ref 8–23)
BUN: 13 mg/dL (ref 8–23)
CO2: 26 mmol/L (ref 22–32)
CO2: 24 mmol/L (ref 22–32)
Calcium: 9.6 mg/dL (ref 8.9–10.3)
Calcium: 8.9 mg/dL (ref 8.9–10.3)
Chloride: 105 mmol/L (ref 98–111)
Chloride: 108 mmol/L (ref 98–111)
Creatinine, Ser: 1.44 mg/dL — ABNORMAL HIGH (ref 0.61–1.24)
Creatinine, Ser: 1.29 mg/dL — ABNORMAL HIGH (ref 0.61–1.24)
GFR calc Af Amer: 56 mL/min — ABNORMAL LOW (ref >60)
GFR calc Af Amer: >60 mL/min (ref >60)
GFR calc non Af Amer: 49 mL/min — ABNORMAL LOW (ref >60)
GFR calc non Af Amer: 55 mL/min — ABNORMAL LOW (ref >60)
Glucose, Bld: 148 mg/dL — ABNORMAL HIGH (ref 70–99)
Glucose, Bld: 78 mg/dL (ref 70–99)
Potassium: 5.6 mmol/L — ABNORMAL HIGH (ref 3.5–5.1)
Potassium: 4.5 mmol/L (ref 3.5–5.1)
Sodium: 138 mmol/L (ref 135–145)
Sodium: 136 mmol/L (ref 135–145)

## 2019-07-29 LAB — COOXEMETRY PANEL
Carboxyhemoglobin: 0.9 % (ref 0.5–1.5)
Methemoglobin: 1 % (ref 0.0–1.5)
O2 Saturation: 60.7 %
Total hemoglobin: 11.7 g/dL — ABNORMAL LOW (ref 12.0–16.0)

## 2019-07-29 LAB — GLUCOSE, CAPILLARY
Glucose-Capillary: 112 mg/dL — ABNORMAL HIGH (ref 70–99)
Glucose-Capillary: 73 mg/dL (ref 70–99)
Glucose-Capillary: 155 mg/dL — ABNORMAL HIGH (ref 70–99)

## 2019-07-29 LAB — CBC
HCT: 36.1 % — ABNORMAL LOW (ref 39.0–52.0)
Hemoglobin: 11.7 g/dL — ABNORMAL LOW (ref 13.0–17.0)
MCH: 30.8 pg (ref 26.0–34.0)
MCHC: 32.4 g/dL (ref 30.0–36.0)
MCV: 95 fL (ref 80.0–100.0)
Platelets: 306 10*3/uL (ref 150–400)
RBC: 3.8 MIL/uL — ABNORMAL LOW (ref 4.22–5.81)
RDW: 14.7 % (ref 11.5–15.5)
WBC: 6.3 10*3/uL (ref 4.0–10.5)
nRBC: 0 % (ref 0.0–0.2)

## 2019-07-29 LAB — APTT: aPTT: 74 seconds — ABNORMAL HIGH (ref 24–36)

## 2019-07-29 LAB — MAGNESIUM: Magnesium: 1.8 mg/dL (ref 1.7–2.4)

## 2019-07-29 LAB — POTASSIUM: Potassium: 5.1 mmol/L (ref 3.5–5.1)

## 2019-07-29 LAB — HEPARIN LEVEL (UNFRACTIONATED): Heparin Unfractionated: 0.79 IU/mL — ABNORMAL HIGH (ref 0.30–0.70)

## 2019-07-29 SURGERY — LEFT HEART CATH AND CORS/GRAFTS ANGIOGRAPHY
Anesthesia: LOCAL

## 2019-07-29 MED ORDER — SODIUM CHLORIDE 0.9% FLUSH: 3.0000 mL | INTRAVENOUS | Status: DC | PRN

## 2019-07-29 MED ORDER — APIXABAN 5 MG PO TABS: 5.0000 mg | ORAL_TABLET | Freq: Two times a day (BID) | ORAL | Status: DC

## 2019-07-29 MED ORDER — ACETAMINOPHEN 325 MG PO TABS: 650.0000 mg | ORAL_TABLET | ORAL | Status: DC | PRN

## 2019-07-29 MED ORDER — VERAPAMIL HCL 2.5 MG/ML IV SOLN
INTRAVENOUS | Status: AC
  Filled 2019-07-29: qty 2

## 2019-07-29 MED ORDER — HEPARIN (PORCINE) IN NACL 1000-0.9 UT/500ML-% IV SOLN
INTRAVENOUS | Status: DC | PRN
  Administered 2019-07-29 (×2): 500 mL

## 2019-07-29 MED ORDER — SODIUM CHLORIDE 0.9 % IV SOLN: 250.0000 mL | INTRAVENOUS | Status: DC | PRN

## 2019-07-29 MED ORDER — VERAPAMIL HCL 2.5 MG/ML IV SOLN
INTRAVENOUS | Status: DC | PRN
  Administered 2019-07-29: 10 mL via INTRA_ARTERIAL

## 2019-07-29 MED ORDER — LOSARTAN POTASSIUM 25 MG PO TABS: 12.5000 mg | ORAL_TABLET | Freq: Two times a day (BID) | ORAL | 6 refills | Status: DC

## 2019-07-29 MED ORDER — LIDOCAINE HCL (PF) 1 % IJ SOLN
INTRAMUSCULAR | Status: AC
  Filled 2019-07-29: qty 30

## 2019-07-29 MED ORDER — LABETALOL HCL 5 MG/ML IV SOLN: 10.0000 mg | INTRAVENOUS | Status: DC | PRN

## 2019-07-29 MED ORDER — MIDAZOLAM HCL 2 MG/2ML IJ SOLN
INTRAMUSCULAR | Status: DC | PRN
  Administered 2019-07-29: 1 mg via INTRAVENOUS

## 2019-07-29 MED ORDER — HYDRALAZINE HCL 20 MG/ML IJ SOLN: 10.0000 mg | INTRAMUSCULAR | Status: DC | PRN

## 2019-07-29 MED ORDER — LOSARTAN POTASSIUM 25 MG PO TABS: 12.5000 mg | ORAL_TABLET | Freq: Two times a day (BID) | ORAL | Status: DC

## 2019-07-29 MED ORDER — MILRINONE LACTATE IN DEXTROSE 20-5 MG/100ML-% IV SOLN: 0.1250 ug/kg/min | INTRAVENOUS | 52 refills | Status: DC

## 2019-07-29 MED ORDER — ONDANSETRON HCL 4 MG/2ML IJ SOLN: 4.0000 mg | Freq: Four times a day (QID) | INTRAMUSCULAR | Status: DC | PRN

## 2019-07-29 MED ORDER — FENTANYL CITRATE (PF) 100 MCG/2ML IJ SOLN
INTRAMUSCULAR | Status: AC
  Filled 2019-07-29: qty 2

## 2019-07-29 MED ORDER — FENTANYL CITRATE (PF) 100 MCG/2ML IJ SOLN
INTRAMUSCULAR | Status: DC | PRN
  Administered 2019-07-29: 25 ug via INTRAVENOUS

## 2019-07-29 MED ORDER — SODIUM CHLORIDE 0.9 % WEIGHT BASED INFUSION: 1.0000 mL/kg/h | INTRAVENOUS | Status: DC

## 2019-07-29 MED ORDER — SPIRONOLACTONE 25 MG PO TABS: 25.0000 mg | ORAL_TABLET | Freq: Every day | ORAL | Status: DC

## 2019-07-29 MED ORDER — MIDAZOLAM HCL 2 MG/2ML IJ SOLN
INTRAMUSCULAR | Status: AC
  Filled 2019-07-29: qty 2

## 2019-07-29 MED ORDER — HEPARIN SODIUM (PORCINE) 1000 UNIT/ML IJ SOLN
INTRAMUSCULAR | Status: DC | PRN
  Administered 2019-07-29: 4000 [IU] via INTRAVENOUS

## 2019-07-29 MED ORDER — IOHEXOL 350 MG/ML SOLN
INTRAVENOUS | Status: AC
  Filled 2019-07-29: qty 1

## 2019-07-29 MED ORDER — LIDOCAINE HCL (PF) 1 % IJ SOLN
INTRAMUSCULAR | Status: DC | PRN
  Administered 2019-07-29: 2 mL via INTRADERMAL

## 2019-07-29 MED ORDER — DIGOXIN 62.5 MCG PO TABS: 0.0625 mg | ORAL_TABLET | Freq: Every day | ORAL | 6 refills | Status: DC

## 2019-07-29 MED ORDER — SODIUM CHLORIDE 0.9% FLUSH: 3.0000 mL | Freq: Two times a day (BID) | INTRAVENOUS | Status: DC

## 2019-07-29 MED ORDER — HEPARIN (PORCINE) IN NACL 1000-0.9 UT/500ML-% IV SOLN
INTRAVENOUS | Status: AC
  Filled 2019-07-29: qty 1000

## 2019-07-29 MED ORDER — IOHEXOL 350 MG/ML SOLN
INTRAVENOUS | Status: DC | PRN
  Administered 2019-07-29: 130 mL

## 2019-07-29 MED ORDER — APIXABAN 5 MG PO TABS: 5.0000 mg | ORAL_TABLET | Freq: Two times a day (BID) | ORAL | 6 refills | Status: DC

## 2019-07-29 MED ORDER — AMIODARONE HCL 200 MG PO TABS: ORAL_TABLET | ORAL | 6 refills | Status: DC

## 2019-07-29 MED FILL — DIGOXIN 0.125 MG TABLET: 125 | 30 days supply | Qty: 15 | Fill #0

## 2019-07-29 MED FILL — LOSARTAN POTASSIUM 25 MG TA: 25 | 30 days supply | Qty: 30 | Fill #0

## 2019-07-29 MED FILL — ELIQUIS 5 MG TABLET: 5 | 30 days supply | Qty: 60 | Fill #0

## 2019-07-29 MED FILL — AMIODARONE HCL 200 MG TAB: 200 | 30 days supply | Qty: 45 | Fill #0

## 2019-07-29 SURGICAL SUPPLY — 10 items
CATH EXPO 5F MPA-1 (CATHETERS) ×1 IMPLANT
CATH INFINITI 5FR MULTPACK ANG (CATHETERS) ×1 IMPLANT
DEVICE RAD COMP TR BAND LRG (VASCULAR PRODUCTS) ×1 IMPLANT
GLIDESHEATH SLEND SS 6F .021 (SHEATH) ×1 IMPLANT
GUIDEWIRE INQWIRE 1.5J.035X260 (WIRE) IMPLANT
INQWIRE 1.5J .035X260CM (WIRE) ×2
KIT HEART LEFT (KITS) ×2 IMPLANT
PACK CARDIAC CATHETERIZATION (CUSTOM PROCEDURE TRAY) ×2 IMPLANT
TRANSDUCER W/STOPCOCK (MISCELLANEOUS) ×2 IMPLANT
TUBING CIL FLEX 10 FLL-RA (TUBING) ×2 IMPLANT

## 2019-07-29 NOTE — Plan of Care (Signed)
  Problem: Clinical Measurements: Goal: Ability to maintain clinical measurements within normal limits will improve Outcome: Adequate for Discharge Goal: Will remain free from infection Outcome: Adequate for Discharge Goal: Respiratory complications will improve Outcome: Adequate for Discharge Goal: Cardiovascular complication will be avoided Outcome: Adequate for Discharge   Problem: Coping: Goal: Level of anxiety will decrease Outcome: Adequate for Discharge   Problem: Elimination: Goal: Will not experience complications related to bowel motility Outcome: Adequate for Discharge Goal: Will not experience complications related to urinary retention Outcome: Adequate for Discharge   Problem: Pain Managment: Goal: General experience of comfort will improve Outcome: Adequate for Discharge   Problem: Safety: Goal: Ability to remain free from injury will improve Outcome: Adequate for Discharge   Problem: Skin Integrity: Goal: Risk for impaired skin integrity will decrease Outcome: Adequate for Discharge   Problem: Education: Goal: Ability to demonstrate management of disease process will improve Outcome: Adequate for Discharge Goal: Ability to verbalize understanding of medication therapies will improve Outcome: Adequate for Discharge Goal: Individualized Educational Video(s) Outcome: Adequate for Discharge   Problem: Activity: Goal: Capacity to carry out activities will improve Outcome: Adequate for Discharge   Problem: Cardiac: Goal: Ability to achieve and maintain adequate cardiopulmonary perfusion will improve Outcome: Adequate for Discharge

## 2019-07-29 NOTE — Progress Notes (Signed)
Pt awaiting cath lab. Reviewed HF management for home and discussed walking. Encouraged him to pursue CRPII "down the road" after he is off of milrinone.  [id number] [place], ACSM 12:21 PM 07/29/2019

## 2019-07-29 NOTE — TOC Transition Note (Addendum)
Transition of Care [facility]) - CM/SW Discharge Note   Patient Details  Name: CALLIN GRISE MRN: [id number] Date of Birth: 10-12-1945  [facility] CM/SW Contact:  Zenon Mayo, RN Phone Number: 07/29/2019, 4:09 PM   Clinical Narrative:    NCM notified Jodelle [facility]  About discharge for [facility].   Final next level of care: Home w Home Health Services Barriers to Discharge: No Barriers Identified   Patient Goals and CMS Choice        Discharge Placement                       Discharge Plan and Services   Discharge Planning Services: CM Consult                                 Social Determinants of Health (SDOH) Interventions     Readmission Risk Interventions No flowsheet data found.

## 2019-07-29 NOTE — Progress Notes (Addendum)
Patient ID: Jordan Ross, male   DOB: 1946/04/06, 74 y.o.   MRN: [id number]    Advanced Heart Failure Rounding Note   Subjective:    Milrinone was stopped 07/24/19. CO-OX down to 43%, milrinone 0.125 mcg/kg/min restarted.  Co-ox 61% this morning with CVP 3-4.  Now has tunneled PICC.    No complaints this morning. Awaiting R/LHC.   Objective:   Weight Range:  Vital Signs:   Temp:  [97.7 F (36.5 C)-98 F (36.7 C)] 97.7 F (36.5 C) (04/12 0817) Pulse Rate:  [60-65] 60 (04/12 0817) Resp:  [10-22] 12 (04/12 0817) BP: (100-124)/(60-82) 111/65 (04/12 0817) SpO2:  [95 %-99 %] 99 % (04/12 0817) Weight:  [75 kg] 75 kg (04/12 0400) Last BM Date: 07/26/19  Weight change: Filed Weights   07/27/19 0335 07/28/19 0500 07/29/19 0400  Weight: 75.9 kg 75.6 kg 75 kg    Intake/Output:   Intake/Output Summary (Last 24 hours) at 07/29/2019 0823 Last data filed at 07/29/2019 0350 Gross per 24 hour  Intake 904.52 ml  Output 1575 ml  Net -670.48 ml     PHYSICAL EXAM: CVP 3-4  General:  Well appearing. No respiratory difficulty HEENT: normal Neck: supple. no JVD. Carotids 2+ bilat; no bruits. No lymphadenopathy or thyromegaly appreciated. Cor: PMI nondisplaced. Regular rate & rhythm. No rubs, gallops or murmurs. Lungs: clear Abdomen: soft, nontender, nondistended. No hepatosplenomegaly. No bruits or masses. Good bowel sounds. Extremities: no cyanosis, clubbing, rash, edema Neuro: alert & oriented x 3, cranial nerves grossly intact. moves all 4 extremities w/o difficulty. Affect pleasant.  Telemetry: NSR 70s   Labs: Basic Metabolic Panel: Recent Labs  Lab 07/23/19 0500 07/24/19 0433 07/25/19 0500 07/25/19 0500 07/26/19 1444 07/26/19 1444 07/27/19 0550 07/28/19 0429 07/29/19 0247  NA 141   < > 137  --  135  --  139 139 138  K 3.9   < > 4.8  --  4.6  --  4.3 4.8 5.6*  CL 106   < > 104  --  104  --  108 104 105  CO2 25   < > 24  --  23  --  24 26 26   GLUCOSE 72   < > 166*  --   279*  --  126* 132* 148*  BUN 13   < > 14  --  18  --  16 14 14   CREATININE 1.23   < > 1.28*  --  1.32*  --  1.38* 1.44* 1.44*  CALCIUM 9.1   < > 9.3   < > 9.2   < > 8.9 9.8 9.6  MG 1.7  --   --   --   --   --   --   --  1.8   < > = values in this interval not displayed.    Liver Function Tests: No results for input(s): AST, ALT, ALKPHOS, BILITOT, PROT, ALBUMIN in the last 168 hours. No results for input(s): LIPASE, AMYLASE in the last 168 hours. No results for input(s): AMMONIA in the last 168 hours.  CBC: Recent Labs  Lab 07/27/19 0550 07/28/19 0429 07/29/19 0247  WBC 6.2 5.5 6.3  HGB 11.3* 12.1* 11.7*  HCT 35.0* 38.3* 36.1*  MCV 95.1 94.8 95.0  PLT 273 319 306    Cardiac Enzymes: No results for input(s): CKTOTAL, CKMB, CKMBINDEX, TROPONINI in the last 168 hours.  BNP: BNP (last 3 results) Recent Labs    06/19/19 1511 07/12/19 1323 07/14/19 0142  BNP 1,000.0* 1,976.9* 1,176.1*    ProBNP (last 3 results) No results for input(s): PROBNP in the last 8760 hours.    Other results:  Imaging: No results found.   Medications:     Scheduled Medications: . amiodarone  200 mg Oral BID  . aspirin  81 mg Oral Pre-Cath  . [START ON 07/30/2019] aspirin  81 mg Oral Daily  . atorvastatin  40 mg Oral Daily  . Chlorhexidine Gluconate Cloth  6 each Topical Daily  . digoxin  0.0625 mg Oral Daily  . docusate sodium  100 mg Oral Daily  . insulin aspart  0-9 Units Subcutaneous TID WC  . insulin glargine  25 Units Subcutaneous QHS  . losartan  12.5 mg Oral BID  . pantoprazole  40 mg Oral Daily  . polyethylene glycol  17 g Oral Daily  . sodium chloride flush  10-40 mL Intracatheter Q12H  . sodium chloride flush  3 mL Intravenous Q12H  . spironolactone  25 mg Oral Daily    Infusions: . sodium chloride    . sodium chloride    . sodium chloride 75 mL/hr at 07/29/19 0600  . heparin 800 Units/hr (07/28/19 1657)  . milrinone 0.125 mcg/kg/min (07/28/19 1657)    PRN  Medications: Place/Maintain arterial line **AND** sodium chloride, sodium chloride, acetaminophen, albuterol, lidocaine (PF), nitroGLYCERIN, ondansetron (ZOFRAN) IV, sodium chloride flush, sodium chloride flush, sorbitol   Assessment/Plan:   1. Acute on chronic systolic HF -> cardiogenic shock - due to mixed iCM/NICM - baseline EF 30-35% due to iCM. EF this admit 10-15% due to tachy-induced CM (atrial fibrillation with RVR).  - Initial co-ox 37% -> milrinone started 3/28 and titrated up to 0.375. - Off milrinone on  07/24/19. CO-OX 50% with repeat lower at 43%. Milrinone restarted at 0.125 on 4/9, co-ox 61% this morning.  - Looks like he will need to continue milrinone 0.125 mcg/kg/min.  Plan for continuing this at home with slow wean, hopefully can stop in future as stunning from tachycardia-mediated CMP resolves.  - CVP 3-4, no diuretic today.  - Hold spiro for hyperkalemia (5.6)  - Continue digoxin 0.125 mg daily.  - Hold losartan 12.5 mg bid given hyperkalemia, SBP generally in 100s but may be able to eventually transition to [facility].  - no b-blocker with low output - LBBB, will likely be eventual CRT candidate. He has ICD.   - plan cath on today to rule out worsening CAD as contributor to worsening CHF.     2. Afib/flutler, Persistent - with RVR likely the source of worsening cardiomyopathy. - Maintaining NSR now on amiodarone, continue.  - Eliquis held for cath, restart afterwards. Cover with heparin gtt.   3. AKI - baseline creatinine ~1.2 - scr peaked to 2.1, suspect cardiorenal.  - 1.44 today.   4. CAD s/p CABG - s/p anterior MI in 2001 followed by CABG - no recent cath but denies angina - continue medical therapy - Plan to cath today to rule out worsening CAD as trigger for event. Discussed risks/benefits with patient and he agrees to procedure.  Will give some gentle precath hydration   5. Hyperkalemia - K 5.6 on am labs. Will repeat BMP to confirm. ? If hemolyzed  sample.  - Hold spiro and losartan for now. Plan to give if repeat K normal - If elevated on repeat draw, will give dose of lokelma    Length of Stay: [place] PA-C 07/29/2019, 8:23 AM  Advanced Heart Failure  Team Pager [phone] (M-F; [place])  Please contact [facility] for night-coverage after hours (4p -7a ) and weekends on [email or web address]  Patient seen with PA, agree with the above note.   Cardiac cath done this afternoon, grafts are patent and there is no interventional target.    He feels good, co-ox 61% on milrinone.  CVP 3-4.  BMET with stable creatinine 1.44 but K 5.6.   General: NAD Neck: No JVD, no thyromegaly or thyroid nodule.  Lungs: Clear to auscultation bilaterally with normal respiratory effort. CV: Nondisplaced PMI.  Heart regular S1/S2, no S3/S4, no murmur.  No peripheral edema.   Abdomen: Soft, nontender, no hepatosplenomegaly, no distention.  Skin: Intact without lesions or rashes.  Neurologic: Alert and oriented x 3.  Psych: Normal affect. Extremities: No clubbing or cyanosis.  HEENT: Normal.   Resend K stat.  Will hold am losartan and hold spironolactone.  For now, will plan on sending him home on losartan 12.5 mg bid but off spironolactone.   Gentle hydration post-cath.   He can go home today for slow titration off low dose milrinone.   He remains in NSR. Continue amiodarone 200 mg bid x 10 days then 200 mg daily.  Can restart Eliquis 8 hours after sheath removed.  Disposition:  Home today with milrinone.  Followup with Dr. Sallyanne Kuster and in CHF clinic.  Will need to wean off milrinone over time.   Meds for discharge: losartan 12.5 mg bid, milrinone 0.125, apixaban 5 mg bid, amiodarone 200 mg bid x 10 days then 200 mg daily, atorvastatin 40 mg daily, digoxin 0.0625 daily.   Loralie Champagne 07/29/2019 2:21 PM

## 2019-07-29 NOTE — Progress Notes (Signed)
Pt dc home with family, on home milrinone w/ picc. Discharge instructions given, [facility] pt home meds. PIV d/c. Answered pt questions to satisfaction.

## 2019-07-29 NOTE — Discharge Summary (Signed)
Advanced Heart Failure Team  Discharge Summary   Patient ID: Jordan Ross MRN: [id number], DOB/AGE: [birth year] 74 y.o. Admit date: 07/12/2019 D/C date:     07/29/2019   Primary Discharge Diagnoses:  1. Acute on chronic systolic HF -> cardiogenic shock 2. Afib/flutler, Persistent 3. AKI 4. CAD s/p CABG 5. Hyperkalemia  Hospital Course:  Jordan Ross is a 74 y.o.malewith CAD s/p CABG in 2001 (LIMA-LAD, SVG to ramus, circumflex, and PDA), HTN, Chronic systolic HF due to iCM, S/p st. Jude single chamber ICD, AAA s/p EVAR by Dr. Trula Slade, DM and HLD.  He had an anterior MI 2001 treated with coronary bypass grafting by Dr. Ladona Mow, His EF at that time was 35%. He subsequently underwent ICD implantation for primary prevention 10/10.   Echo in 2013 EF 30-35%. Has not seen Dr. Loletha Grayer since 12/19.   Seen in ED with HF symptoms on 06/19/19. Treated with IV lasix. ECG at that time sinus with PVCs.  Seen Virtually by APP 06/21/19 with improved symptoms on lasix 20mg  qd with 9lb weight loss since ER visit. He was again seen in clinic 07/04/19 by APP. Breathing stable.  Admitted on 07/12/19 with persistent tachycardia the result of A flutter and Acute/Chronic systolic Heart Failure. Started on amio and IV lasix but developed hypotension and AKI. PICC placed and co-ox 37%. CVP 15. Started on milrinone 0.25. Chemically converted to NSR with amio. Diuresed with IV lasix. Gradually milrinone was weaned off but CO-OX dropped so milrinone was restarted. Tunneled PICC placed for home milrinone.   On the day of discharge he had LHC that showed patents grafts with no interventional target. HF medications adjusted for d/c. Referred to [facility] for home milrinone. Plan to follow closely in the HF clinic. Will try and wean milrinone off in a few weeks.   1. Acute on chronic systolic HF -> cardiogenic shock - due to mixed iCM/NICM. Has ICD - baseline EF 30-35% due to iCM. EF this admit 10-15% due to tachy-induced CM  (atrial fibrillation with RVR).  - Initial co-ox 37% -> milrinone started 3/28 and titrated up to 0.375. - Off milrinone on  07/24/19 but CO-OX back down to milrinone 0.125 mcg restarted. CO-OX improved on milrinone. Referred [facility] for home milrinone.  - Hold spiro for hyperkalemia (5.6)  - Continue digoxin 0.125 mg daily.  - Restart losartan 12.5 mg bid tomorrow.  - No spironolactone with hyperkalemia - no b-blocker with low output - LBBB, will likely be eventual CRT candidate. He has ICD.    2. Afib/flutter, Persistent - with RVR likely the source of worsening cardiomyopathy. - Maintaining NSR now on amiodarone, continue.  - Eliquis held for cath but restarted.    3. AKI - baseline creatinine ~1.2 - scr peaked to 2.1, suspect cardiorenal.  - 1.44 today. Check BMET on Thursday.   4. CAD s/p CABG - s/p anterior MI in 2001 followed by CABG -LHC today patent grafts with no interventional targets.  - continue medical therapy  5. Hyperkalemia - K spiro and restart losartan tomorrow.   Discharge Vitals: Blood pressure 108/69, pulse 63, temperature 97.8 F (36.6 C), temperature source Oral, resp. rate (!) 25, height 6' (1.829 m), weight 75 kg, SpO2 (!) 0 %.  Labs: Lab Results  Component Value Date   WBC 6.3 07/29/2019   HGB 11.7 (L) 07/29/2019   HCT 36.1 (L) 07/29/2019   MCV 95.0 07/29/2019   PLT 306 07/29/2019    Recent Labs  Lab 07/29/19 1404  NA 136  K 4.5  CL 108  CO2 24  BUN 13  CREATININE 1.29*  CALCIUM 8.9  GLUCOSE 78   Lab Results  Component Value Date   CHOL 126 12/23/2016   HDL 41 12/23/2016   LDLCALC 58 12/23/2016   TRIG 136 12/23/2016   BNP (last 3 results) Recent Labs    06/19/19 1511 07/12/19 1323 07/14/19 0142  BNP 1,000.0* 1,976.9* 1,176.1*    ProBNP (last 3 results) No results for input(s): PROBNP in the last 8760 hours.   Diagnostic Studies/Procedures   CARDIAC CATHETERIZATION  Result Date: 07/29/2019 Patent grafts, no  interventional target.    Discharge Medications   Allergies as of 07/29/2019   No Known Allergies     Medication List    STOP taking these medications   aspirin EC 81 MG tablet   Entresto 49-51 MG Generic drug: sacubitril-valsartan   furosemide 20 MG tablet Commonly known as: LASIX   meloxicam 15 MG tablet Commonly known as: MOBIC   metolazone 2.5 MG tablet Commonly known as: ZAROXOLYN     TAKE these medications   acetaminophen 325 MG tablet Commonly known as: TYLENOL Take 975 mg by mouth daily as needed (back pain).   albuterol 108 (90 Base) MCG/ACT inhaler Commonly known as: VENTOLIN HFA Inhale 1-2 puffs into the lungs every 4 (four) hours as needed for wheezing or shortness of breath.   amiodarone 200 MG tablet Commonly known as: PACERONE Take 200 mg twice a day x 10 days then 200 mg dialy   apixaban 5 MG Tabs tablet Commonly known as: ELIQUIS Take 1 tablet (5 mg total) by mouth 2 (two) times daily.   apixaban 5 MG Tabs tablet Commonly known as: ELIQUIS Take 1 tablet (5 mg total) by mouth 2 (two) times daily.   atorvastatin 80 MG tablet Commonly known as: LIPITOR Take 80 mg by mouth daily.   Digoxin 62.5 MCG Tabs Take 0.0625 mg by mouth daily. Start taking on: July 30, 2019   ferrous sulfate 325 (65 FE) MG tablet Take 325 mg by mouth daily with breakfast.   glipiZIDE 10 MG tablet Commonly known as: GLUCOTROL Take 10 mg by mouth 2 (two) times daily before a meal.   glucose blood test strip Use as instructed What changed:   how much to take  how to take this  when to take this   glucose blood test strip Commonly known as: FORA V10 Blood Glucose Test Use as instructed bid. E11.65 What changed:   how much to take  how to take this  when to take this  additional instructions   Lantus SoloStar 100 UNIT/ML Solostar Pen Generic drug: insulin glargine Inject 30 Units into the skin at bedtime.   Insulin Glargine 300 UNIT/ML  Sopn Commonly known as: Toujeo SoloStar Inject 70 Units into the skin at bedtime.   Lancets Ultra Thin Misc Test BG every morning What changed:   how much to take  how to take this  when to take this  additional instructions   FORA Lancets Misc Test bid as directed. E11.65. What changed:   how much to take  how to take this  when to take this  additional instructions   Litetouch Pen Needles 31G X 8 MM Misc Generic drug: Insulin Pen Needle USE AT BEDTIME AS DIRECTED. What changed: See the new instructions.   losartan 25 MG tablet Commonly known as: COZAAR Take 0.5 tablets (12.5 mg total) by mouth 2 (two) times daily.  Start taking on: July 30, 2019   metFORMIN 500 MG tablet Commonly known as: GLUCOPHAGE Take 1,000 mg by mouth 2 (two) times daily with a meal.   milrinone 20 MG/100 ML Soln infusion Commonly known as: PRIMACOR Inject 0.0095 mg/min into the vein continuous. Per [facility] phramacy   multivitamin with minerals Tabs tablet Take 1 tablet by mouth daily.   pantoprazole 40 MG tablet Commonly known as: PROTONIX Take 40 mg by mouth daily.            Durable Medical Equipment  (From admission, onward)         Start     Ordered   07/26/19 1459  Heart failure home health orders  (Heart failure home health orders / Face to face)  Once    Comments: Heart Failure Follow-up Care:  Verify follow-up appointments per Patient Discharge Instructions. Confirm transportation arranged. Reconcile home medications with discharge medication list. Remove discontinued medications from use. Assist patient/caregiver to manage medications using pill box. Reinforce low sodium food selection Assessments: Vital signs and oxygen saturation at each visit. Assess home environment for safety concerns, caregiver support and availability of low-sodium foods. Consult Education officer, museum, PT/OT, Dietitian, and CNA based on assessments. Perform comprehensive cardiopulmonary assessment.  Notify MD for any change in condition or weight gain of 3 pounds in one day or 5 pounds in one week with symptoms. Daily Weights and Symptom Monitoring: Ensure patient has access to scales. Teach patient/caregiver to weigh daily before breakfast and after voiding using same scale and record.    Teach patient/caregiver to track weight and symptoms and when to notify Provider. Activity: Develop individualized activity plan with patient/caregiver.   Home Paraenteral Inotropic Therapy : Data Collection Form  Patients name: Jordan Ross   Date: 07/26/19  Information below may not be completed by the supplier nor anyone in a Financial relationship with the supplier.  NYHA IV   1. Results of invasive hemodynamic monitoring  Cardiac Index Before Inotrope infusion:            1.5            On Inotrope infusion:            2                Drug and dose:   Milrinone 0.125 mcg  2. Cardiac medications immediately prior to inotrope infusion (List name, dose, and frequency)  Lasix   3. Dose this represent maximum tolerated doses of these medications? Yes.   4. Breathing status Prior to inotrope infusion: NYHA IV Dyspnea at rest  At time of discharge: Dyspnea on moderate  exertion.   5. Initial home prescription Drug and Dose:   0 .125 mcg  for continuous infusion 24/hr day and 7 days/week  6. If continuous infusion is prescribed, have attempts to discontinue inotrope infusion in the hospital failed?   Yes.   7. If intermittent infusion is prescribed, have there been repeated hospitalizations for heart failure which Parenteral inotrope were required? Not applicable.   8. Is patient capable of going to the physician for outpatient evaluation? Yes.   9. Is routine electrocardiographic monitoring required in the Home?  No.   The above statements and any additional explanations included separately are true and accurate and there is documentation present in the patients medical record to  support these statements.   Completed by Darrick Grinder, NP   In instances where this form was completed by an Advanced Practice  Provider, please see EMR for physician Co-Signature.  [facility] to provide  Labs every other week to include BMET, Mg, and CBC with Diff. Additional as needed. Should be drawn via PERIPHERAL stick. NOT PICC line.   [id number] Milrinone 0.125  mcg/kg/min X 52 weeks  A4221 Supplies for maintenance of drug infusion catheter A4222 Supplies for the external drug infusion per cassette or bag E0781 Ambulatory Infusion pump  Question Answer Comment  Heart Failure Follow-up Care Advanced Heart Failure ([facility]F) Clinic at [phone]   Obtain the following labs Basic Metabolic Panel   Lab frequency Weekly   Fax lab results to AHF Clinic at [phone]   Diet Low Sodium Heart Healthy   Fluid restrictions: 2000 mL Fluid      07/26/19 1501          Disposition   The patient will be discharged in stable condition to home. Discharge Instructions    Diet - low sodium heart healthy   Complete by: As directed    Diet - low sodium heart healthy   Complete by: As directed    Heart Failure patients record your daily weight using the same scale at the same time of day   Complete by: As directed    Heart Failure patients record your daily weight using the same scale at the same time of day   Complete by: As directed    Increase activity slowly   Complete by: As directed    Increase activity slowly   Complete by: As directed      Follow-up Information    Bensimhon, Shaune Pascal, MD Follow up on 08/09/2019.   Specialty: Cardiology Why: at 3:20 [place]  Contact information: [place] [address] [phone]             Duration of Discharge Encounter: Greater than 35 minutes   Signed, Darrick Grinder NP-C  07/29/2019, 3:55 PM

## 2019-07-29 NOTE — Interval H&P Note (Signed)
History and Physical Interval Note:  07/29/2019 1:24 PM  Jordan Ross  has presented today for surgery, with the diagnosis of heart failure.  The various methods of treatment have been discussed with the patient and family. After consideration of risks, benefits and other options for treatment, the patient has consented to  Procedure(s): LEFT HEART CATH AND CORS/GRAFTS ANGIOGRAPHY (N/A) as a surgical intervention.  The patient's history has been reviewed, patient examined, no change in status, stable for surgery.  I have reviewed the patient's chart and labs.  Questions were answered to the patient's satisfaction.     Genene Kilman [employer]

## 2019-07-30 DIAGNOSIS — Z48812 Encounter for surgical aftercare following surgery on the circulatory system: Secondary | ICD-10-CM | POA: Diagnosis not present

## 2019-07-30 DIAGNOSIS — Z79899 Other long term (current) drug therapy: Secondary | ICD-10-CM | POA: Diagnosis not present

## 2019-07-30 DIAGNOSIS — Z452 Encounter for adjustment and management of vascular access device: Secondary | ICD-10-CM | POA: Diagnosis not present

## 2019-08-03 ENCOUNTER — Other Ambulatory Visit: Payer: Self-pay

## 2019-08-03 ENCOUNTER — Encounter (HOSPITAL_COMMUNITY): Payer: Self-pay | Admitting: Emergency Medicine

## 2019-08-03 ENCOUNTER — Observation Stay (HOSPITAL_COMMUNITY)
Admission: EM | Admit: 2019-08-03 | Discharge: 2019-08-05 | Disposition: A | Discharge status: Home / Self Care | Payer: Medicare Other | Attending: Internal Medicine | Admitting: Internal Medicine

## 2019-08-03 DIAGNOSIS — I110 Hypertensive heart disease with heart failure: Secondary | ICD-10-CM | POA: Diagnosis not present

## 2019-08-03 DIAGNOSIS — Z79899 Other long term (current) drug therapy: Secondary | ICD-10-CM | POA: Insufficient documentation

## 2019-08-03 DIAGNOSIS — Z7901 Long term (current) use of anticoagulants: Secondary | ICD-10-CM | POA: Diagnosis not present

## 2019-08-03 DIAGNOSIS — I255 Ischemic cardiomyopathy: Secondary | ICD-10-CM | POA: Insufficient documentation

## 2019-08-03 DIAGNOSIS — E11649 Type 2 diabetes mellitus with hypoglycemia without coma: Principal | ICD-10-CM | POA: Insufficient documentation

## 2019-08-03 DIAGNOSIS — I2510 Atherosclerotic heart disease of native coronary artery without angina pectoris: Secondary | ICD-10-CM | POA: Insufficient documentation

## 2019-08-03 DIAGNOSIS — Z23 Encounter for immunization: Secondary | ICD-10-CM | POA: Insufficient documentation

## 2019-08-03 DIAGNOSIS — Z87891 Personal history of nicotine dependence: Secondary | ICD-10-CM | POA: Insufficient documentation

## 2019-08-03 DIAGNOSIS — K219 Gastro-esophageal reflux disease without esophagitis: Secondary | ICD-10-CM | POA: Insufficient documentation

## 2019-08-03 DIAGNOSIS — Z95810 Presence of automatic (implantable) cardiac defibrillator: Secondary | ICD-10-CM | POA: Insufficient documentation

## 2019-08-03 DIAGNOSIS — E1159 Type 2 diabetes mellitus with other circulatory complications: Secondary | ICD-10-CM | POA: Diagnosis not present

## 2019-08-03 DIAGNOSIS — Z794 Long term (current) use of insulin: Secondary | ICD-10-CM | POA: Diagnosis not present

## 2019-08-03 DIAGNOSIS — E162 Hypoglycemia, unspecified: Secondary | ICD-10-CM | POA: Diagnosis present

## 2019-08-03 DIAGNOSIS — I714 Abdominal aortic aneurysm, without rupture: Secondary | ICD-10-CM | POA: Diagnosis not present

## 2019-08-03 DIAGNOSIS — Z20822 Contact with and (suspected) exposure to covid-19: Secondary | ICD-10-CM | POA: Diagnosis not present

## 2019-08-03 DIAGNOSIS — M1990 Unspecified osteoarthritis, unspecified site: Secondary | ICD-10-CM | POA: Diagnosis not present

## 2019-08-03 DIAGNOSIS — Z951 Presence of aortocoronary bypass graft: Secondary | ICD-10-CM | POA: Insufficient documentation

## 2019-08-03 DIAGNOSIS — E785 Hyperlipidemia, unspecified: Secondary | ICD-10-CM | POA: Diagnosis not present

## 2019-08-03 DIAGNOSIS — I252 Old myocardial infarction: Secondary | ICD-10-CM | POA: Insufficient documentation

## 2019-08-03 DIAGNOSIS — I447 Left bundle-branch block, unspecified: Secondary | ICD-10-CM | POA: Diagnosis not present

## 2019-08-03 DIAGNOSIS — I10 Essential (primary) hypertension: Secondary | ICD-10-CM | POA: Diagnosis not present

## 2019-08-03 DIAGNOSIS — I4891 Unspecified atrial fibrillation: Secondary | ICD-10-CM | POA: Insufficient documentation

## 2019-08-03 DIAGNOSIS — I5022 Chronic systolic (congestive) heart failure: Secondary | ICD-10-CM | POA: Diagnosis present

## 2019-08-03 LAB — HEMOGLOBIN A1C
Hgb A1c MFr Bld: 8.6 % — ABNORMAL HIGH (ref 4.8–5.6)
Mean Plasma Glucose: 200.12 mg/dL

## 2019-08-03 LAB — CBG MONITORING, ED
Glucose-Capillary: 64 mg/dL — ABNORMAL LOW (ref 70–99)
Glucose-Capillary: 52 mg/dL — ABNORMAL LOW (ref 70–99)
Glucose-Capillary: 62 mg/dL — ABNORMAL LOW (ref 70–99)
Glucose-Capillary: 84 mg/dL (ref 70–99)
Glucose-Capillary: 128 mg/dL — ABNORMAL HIGH (ref 70–99)
Glucose-Capillary: 114 mg/dL — ABNORMAL HIGH (ref 70–99)

## 2019-08-03 LAB — URINALYSIS, ROUTINE W REFLEX MICROSCOPIC
Bilirubin Urine: NEGATIVE
Glucose, UA: NEGATIVE mg/dL
Hgb urine dipstick: NEGATIVE
Ketones, ur: NEGATIVE mg/dL
Leukocytes,Ua: NEGATIVE
Nitrite: NEGATIVE
Protein, ur: NEGATIVE mg/dL
Specific Gravity, Urine: 1.003 — ABNORMAL LOW (ref 1.005–1.030)
pH: 5 (ref 5.0–8.0)

## 2019-08-03 LAB — CBC WITH DIFFERENTIAL/PLATELET
Abs Immature Granulocytes: 0.06 10*3/uL (ref 0.00–0.07)
Basophils Absolute: 0.1 10*3/uL (ref 0.0–0.1)
Basophils Relative: 1 %
Eosinophils Absolute: 0.7 10*3/uL — ABNORMAL HIGH (ref 0.0–0.5)
Eosinophils Relative: 7 %
HCT: 42.2 % (ref 39.0–52.0)
Hemoglobin: 13.2 g/dL (ref 13.0–17.0)
Immature Granulocytes: 1 %
Lymphocytes Relative: 8 %
Lymphs Abs: 0.8 10*3/uL (ref 0.7–4.0)
MCH: 30.5 pg (ref 26.0–34.0)
MCHC: 31.3 g/dL (ref 30.0–36.0)
MCV: 97.5 fL (ref 80.0–100.0)
Monocytes Absolute: 0.7 10*3/uL (ref 0.1–1.0)
Monocytes Relative: 7 %
Neutro Abs: 7.2 10*3/uL (ref 1.7–7.7)
Neutrophils Relative %: 76 %
Platelets: 331 10*3/uL (ref 150–400)
RBC: 4.33 MIL/uL (ref 4.22–5.81)
RDW: 14.9 % (ref 11.5–15.5)
WBC: 9.5 10*3/uL (ref 4.0–10.5)
nRBC: 0 % (ref 0.0–0.2)

## 2019-08-03 LAB — COMPREHENSIVE METABOLIC PANEL
ALT: 30 U/L (ref 0–44)
AST: 34 U/L (ref 15–41)
Albumin: 4 g/dL (ref 3.5–5.0)
Alkaline Phosphatase: 37 U/L — ABNORMAL LOW (ref 38–126)
Anion gap: 9 (ref 5–15)
BUN: 13 mg/dL (ref 8–23)
CO2: 25 mmol/L (ref 22–32)
Calcium: 10 mg/dL (ref 8.9–10.3)
Chloride: 103 mmol/L (ref 98–111)
Creatinine, Ser: 1.27 mg/dL — ABNORMAL HIGH (ref 0.61–1.24)
GFR calc Af Amer: >60 mL/min (ref >60)
GFR calc non Af Amer: 56 mL/min — ABNORMAL LOW (ref >60)
Glucose, Bld: 37 mg/dL — CL (ref 70–99)
Potassium: 5.1 mmol/L (ref 3.5–5.1)
Sodium: 137 mmol/L (ref 135–145)
Total Bilirubin: 0.3 mg/dL (ref 0.3–1.2)
Total Protein: 7.2 g/dL (ref 6.5–8.1)

## 2019-08-03 LAB — GLUCOSE, CAPILLARY
Glucose-Capillary: 78 mg/dL (ref 70–99)
Glucose-Capillary: 107 mg/dL — ABNORMAL HIGH (ref 70–99)
Glucose-Capillary: 145 mg/dL — ABNORMAL HIGH (ref 70–99)

## 2019-08-03 LAB — SARS CORONAVIRUS 2 (TAT 6-24 HRS): SARS Coronavirus 2: NEGATIVE

## 2019-08-03 LAB — COOXEMETRY PANEL
Carboxyhemoglobin: 1 % (ref 0.5–1.5)
Methemoglobin: 1.2 % (ref 0.0–1.5)
O2 Saturation: 51.2 %
Total hemoglobin: 12.8 g/dL (ref 12.0–16.0)

## 2019-08-03 LAB — DIGOXIN LEVEL: Digoxin Level: 0.7 ng/mL — ABNORMAL LOW (ref 0.8–2.0)

## 2019-08-03 MED ORDER — MILRINONE LACTATE IN DEXTROSE 20-5 MG/100ML-% IV SOLN
0.1250 ug/kg/min | INTRAVENOUS | Status: DC
  Administered 2019-08-03 – 2019-08-04 (×2): 0.125 ug/kg/min via INTRAVENOUS
  Filled 2019-08-03 (×2): qty 100

## 2019-08-03 MED ORDER — ONDANSETRON HCL 4 MG PO TABS: 4.0000 mg | ORAL_TABLET | Freq: Four times a day (QID) | ORAL | Status: DC | PRN

## 2019-08-03 MED ORDER — DIGOXIN 125 MCG PO TABS
0.0625 mg | ORAL_TABLET | Freq: Every day | ORAL | Status: DC
  Administered 2019-08-03 – 2019-08-05 (×3): 0.0625 mg via ORAL
  Filled 2019-08-03 (×3): qty 1

## 2019-08-03 MED ORDER — AMIODARONE HCL 200 MG PO TABS
200.0000 mg | ORAL_TABLET | Freq: Two times a day (BID) | ORAL | Status: DC
  Administered 2019-08-03 – 2019-08-05 (×5): 200 mg via ORAL
  Filled 2019-08-03 (×6): qty 1

## 2019-08-03 MED ORDER — ALBUTEROL SULFATE (2.5 MG/3ML) 0.083% IN NEBU: 2.5000 mg | INHALATION_SOLUTION | RESPIRATORY_TRACT | Status: DC | PRN

## 2019-08-03 MED ORDER — ATORVASTATIN CALCIUM 80 MG PO TABS
80.0000 mg | ORAL_TABLET | Freq: Every day | ORAL | Status: DC
  Administered 2019-08-03 – 2019-08-05 (×3): 80 mg via ORAL
  Filled 2019-08-03 (×3): qty 1

## 2019-08-03 MED ORDER — DEXTROSE 10 % IV SOLN: INTRAVENOUS | Status: DC

## 2019-08-03 MED ORDER — APIXABAN 5 MG PO TABS
5.0000 mg | ORAL_TABLET | Freq: Two times a day (BID) | ORAL | Status: DC
  Administered 2019-08-03 – 2019-08-05 (×5): 5 mg via ORAL
  Filled 2019-08-03 (×6): qty 1

## 2019-08-03 MED ORDER — CHLORHEXIDINE GLUCONATE CLOTH 2 % EX PADS
6.0000 | MEDICATED_PAD | Freq: Every day | CUTANEOUS | Status: DC
  Administered 2019-08-03 – 2019-08-04 (×2): 6 via TOPICAL

## 2019-08-03 MED ORDER — ACETAMINOPHEN 650 MG RE SUPP: 650.0000 mg | Freq: Four times a day (QID) | RECTAL | Status: DC | PRN

## 2019-08-03 MED ORDER — SODIUM CHLORIDE 0.9% FLUSH
3.0000 mL | Freq: Two times a day (BID) | INTRAVENOUS | Status: DC
  Administered 2019-08-03 – 2019-08-05 (×5): 3 mL via INTRAVENOUS

## 2019-08-03 MED ORDER — MILRINONE LACTATE 50 MG/50ML IV SOLN: 0.1250 ug/kg/min | INTRAVENOUS | Status: DC

## 2019-08-03 MED ORDER — DEXTROSE 50 % IV SOLN
1.0000 | Freq: Once | INTRAVENOUS | Status: AC
  Administered 2019-08-03: 50 mL via INTRAVENOUS
  Filled 2019-08-03: qty 50

## 2019-08-03 MED ORDER — AMIODARONE HCL 200 MG PO TABS: 200.0000 mg | ORAL_TABLET | Freq: Every day | ORAL | Status: DC

## 2019-08-03 MED ORDER — AMIODARONE HCL 200 MG PO TABS: 200.0000 mg | ORAL_TABLET | ORAL | Status: DC

## 2019-08-03 MED ORDER — PNEUMOCOCCAL VAC POLYVALENT 25 MCG/0.5ML IJ INJ
0.5000 mL | INJECTION | INTRAMUSCULAR | Status: AC
  Administered 2019-08-04: 0.5 mL via INTRAMUSCULAR
  Filled 2019-08-03: qty 0.5

## 2019-08-03 MED ORDER — ACETAMINOPHEN 325 MG PO TABS: 650.0000 mg | ORAL_TABLET | Freq: Four times a day (QID) | ORAL | Status: DC | PRN

## 2019-08-03 MED ORDER — FERROUS SULFATE 325 (65 FE) MG PO TABS
325.0000 mg | ORAL_TABLET | Freq: Every day | ORAL | Status: DC
  Administered 2019-08-03 – 2019-08-05 (×3): 325 mg via ORAL
  Filled 2019-08-03 (×3): qty 1

## 2019-08-03 MED ORDER — LOSARTAN POTASSIUM 25 MG PO TABS
12.5000 mg | ORAL_TABLET | Freq: Two times a day (BID) | ORAL | Status: DC
  Administered 2019-08-03 – 2019-08-05 (×5): 12.5 mg via ORAL
  Filled 2019-08-03 (×3): qty 1
  Filled 2019-08-03 (×2): qty 0.5
  Filled 2019-08-03: qty 1

## 2019-08-03 MED ORDER — PANTOPRAZOLE SODIUM 20 MG PO TBEC
20.0000 mg | DELAYED_RELEASE_TABLET | Freq: Every day | ORAL | Status: DC
  Administered 2019-08-03 – 2019-08-05 (×3): 20 mg via ORAL
  Filled 2019-08-03 (×3): qty 1

## 2019-08-03 MED ORDER — ONDANSETRON HCL 4 MG/2ML IJ SOLN: 4.0000 mg | Freq: Four times a day (QID) | INTRAMUSCULAR | Status: DC | PRN

## 2019-08-03 MED ORDER — SENNOSIDES-DOCUSATE SODIUM 8.6-50 MG PO TABS: 1.0000 | ORAL_TABLET | Freq: Every evening | ORAL | Status: DC | PRN

## 2019-08-03 NOTE — ED Notes (Signed)
Patient refused orange juice at triage. States he wanted apple juice. Apple juice given

## 2019-08-03 NOTE — ED Triage Notes (Signed)
Pt here c/o hypoglycemia, pt states last insulin use was yesterday afternoon and he just got something to eat at 3 am. cbg on triage 61. One cup of juice given. Pt is AO x4, NAD noticed.

## 2019-08-03 NOTE — ED Notes (Signed)
Pt given 2x 4oz apple juice. MD made aware of 52 CBG. Also ordered 10% dextrose

## 2019-08-03 NOTE — H&P (Signed)
Date: 08/03/2019               Patient Name:  Jordan Ross MRN: [id number]  DOB: [birth year] Age / Sex: 74 y.o., male   PCP: Redmond School, MD         Medical Service: Internal Medicine Teaching Service         Attending Physician: Dr. Evette Doffing, Mallie Mussel, *    First Contact: Marva Panda, MD, [place] Pager: [place] [phone])  Second Contact: Masoudi, MD, [place] Pager: EM [phone])       After Hours (After 5p/  First Contact Pager: [phone]  weekends / holidays): Second Contact Pager: [phone]   Chief Complaint: persistent hypoglycemia  History of Present Illness: 74 y.o. yo male w/ PMH significant for CAD s/p CABG in 2001, hypertension, ischemic cardiomyopathy, HFrEF (EF <20%) s/p St. Jude single chamber ICD, AAA s/p EVAR, diabetes mellitus and hyperlipidemia presenting with persistent hypoglycemia. History obtained by patient and daughter at bedside.  Patient reports that he was in his usual state of health until yesterday morning when he had an episode of hypoglycemia.  He reports shaking and sweating profusely.  When daughter checked his blood glucose, CBG 34.  EMS was called and patient was given oral glucose with improvement.  Patient reports taking all his medications yesterday morning.  However, he notes that he did have some shakiness throughout the day.  He did not take his evening medications.  This morning, the patient had another episode of hypoglycemia.  He reports feeling significantly diaphoretic at the time. He denies any fevers, chills, chest pain, lightheadedness, headaches, shortness of breath, nausea/vomiting, abdominal pain, diarrhea, recent weight loss or recent sick contacts.  Of note, patient had recent admission on 3/26 for atrial flutter and acute on chronic systolic heart failure. He was diuresed with lasix and chemically converted to NSR with amiodarone. Patient had tunneled PICC placed due to continued milrinone requirement with plans for weaning over few  weeks.   ED course: Patient presented with hypoglycemic episode with CBG 61 treated with apple juice; however, continued to be hypoglycemic with CBG of 52 for which started on 10% dextrose gtt. Patient admitted to internal medicine for further glycemic management.   Meds:  Current Meds  Medication Sig  . albuterol (VENTOLIN HFA) 108 (90 Base) MCG/ACT inhaler Inhale 1-2 puffs into the lungs every 4 (four) hours as needed for wheezing or shortness of breath.   [name] amiodarone (PACERONE) 200 MG tablet Take 200 mg twice a day x 10 days then 200 mg dialy (Patient taking differently: Take 200 mg by mouth See admin instructions. Take 200 mg twice a day x 10 days then 200 mg daily)  . apixaban (ELIQUIS) 5 MG TABS tablet Take 1 tablet (5 mg total) by mouth 2 (two) times daily.  [name] atorvastatin (LIPITOR) 80 MG tablet Take 80 mg by mouth daily.  . digoxin (LANOXIN) 0.125 MG tablet Take 0.0625 mg by mouth daily.  . ferrous sulfate 325 (65 FE) MG tablet Take 325 mg by mouth daily with breakfast.  . glipiZIDE (GLUCOTROL) 10 MG tablet Take 10 mg by mouth 2 (two) times daily before a meal.   . insulin glargine (LANTUS SOLOSTAR) 100 UNIT/ML Solostar Pen Inject 30 Units into the skin at bedtime.  [name] losartan (COZAAR) 25 MG tablet Take 0.5 tablets (12.5 mg total) by mouth 2 (two) times daily.  . metFORMIN (GLUCOPHAGE) 500 MG tablet Take 1,000 mg by mouth 2 (two) times daily with  a meal.   . Milrinone Lactate 50 MG/50ML SOLN 0.125 mcg/kg/min by Intravenous (Continuous Infusion) route continuous. Set pump at 0.8ml/hr  Wt=75kg  . Multiple Vitamin (MULTIVITAMIN WITH MINERALS) TABS tablet Take 1 tablet by mouth daily.  [name] omeprazole (PRILOSEC OTC) 20 MG tablet Take 20 mg by mouth daily.  [name] oxymetazoline (AFRIN) 0.05 % nasal spray Place 2 sprays into both nostrils 2 (two) times daily as needed for congestion.  . sennosides-docusate sodium (SENOKOT-S) 8.6-50 MG tablet Take 1 tablet by mouth daily.      Allergies: Allergies as of 08/03/2019  . (No Known Allergies)   Past Medical History:  Diagnosis Date  . Abdominal aortic aneurysm ([place])   . AICD (automatic cardioverter/defibrillator) present    St Jude  . Anemia    iron deficiency anemia,takes iron pill daily  . Arthritis   . Coronary artery disease    AWMI 2001  . Diverticulosis   . GERD (gastroesophageal reflux disease)    takes Protonix daily  . History of colon polyps    benign  . History of migraine 40 yrs ago  . History of shingles   . Hyperlipidemia    takes Atorvastatin and Niacin daily  . Hypertension    takes Diovan,Spironolactone, and Carvedilol daily  . ICD (implantable cardioverter-defibrillator), single, in situ   . Ischemic cardiomyopathy   . Joint swelling   . LV dysfunction    takes Digoxin daily  . Myocardial infarction ([place]) 2001  . Pneumonia 40 yrs ago   hx of  . Shortness of breath dyspnea    with exertion.States he was a smoker for yr  . Status post coronary artery bypass grafting   . Type 2 diabetes mellitus ([facility])    takes Glipizide,Metformin,Invokana,and Lantus daily.Average fasting blood sugar runs about 140    Family History:  Family History  Problem Relation Age of Onset  . COPD Mother   . Heart disease Brother      Social History:  Social History   Tobacco Use  . Smoking status: Former Smoker    Years: 40.00  . Smokeless tobacco: Never Used  . Tobacco comment: quit smoking in 2001  Substance Use Topics  . Alcohol use: No  . Drug use: No   Review of Systems: A complete ROS was negative except as per HPI.   Physical Exam: Blood pressure 109/73, pulse 76, temperature 97.8 F (36.6 C), temperature source Oral, resp. rate 16, height 6' (1.829 m), weight 77.1 kg, SpO2 99 %. Physical Exam Vitals reviewed.  Constitutional:      General: He is not in acute distress.    Appearance: Normal appearance. He is not ill-appearing or diaphoretic.  HENT:     Head:  Normocephalic and atraumatic.     Mouth/Throat:     Mouth: Mucous membranes are moist.     Pharynx: Oropharynx is clear. No oropharyngeal exudate or posterior oropharyngeal erythema.  Eyes:     General: No scleral icterus.    Extraocular Movements: Extraocular movements intact.     Conjunctiva/sclera: Conjunctivae normal.  Cardiovascular:     Rate and Rhythm: Normal rate and regular rhythm.     Pulses: Normal pulses.     Heart sounds: Normal heart sounds. No murmur. No friction rub. No gallop.   Pulmonary:     Effort: Pulmonary effort is normal. No respiratory distress.     Breath sounds: Normal breath sounds. No wheezing, rhonchi or rales.  Abdominal:     General: Abdomen is  flat. Bowel sounds are normal. There is no distension.     Palpations: Abdomen is soft.     Tenderness: There is no abdominal tenderness. There is no guarding or rebound.  Musculoskeletal:        General: No swelling, tenderness, deformity or signs of injury. Normal range of motion.     Cervical back: Normal range of motion and neck supple.     Right lower leg: No edema.     Left lower leg: No edema.  Skin:    General: Skin is warm and dry.     Capillary Refill: Capillary refill takes less than 2 seconds.  Neurological:     General: No focal deficit present.     Mental Status: He is alert and oriented to person, place, and time. Mental status is at baseline.     Cranial Nerves: No cranial nerve deficit.     Sensory: No sensory deficit.     Motor: No weakness.    EKG: personally reviewed my interpretation is sinus rhythm; no acute ST segment changes noted; left bundle branch block (unchanged from prior), QTc 442  Assessment & Plan by Problem: Active Problems:   Hypoglycemia Mr. Ori Bolejack is a 74 year old male with a PMHx of CAD s/p CABG in 2001, hypertension, ischemic cardiomyopathy, HFrEF (EF <20%) s/p St. Jude single chamber ICD, AAA s/p EVAR, diabetes mellitus and hyperlipidemia presenting with  persistent hypoglycemia.  Hypoglycemia Type 2 diabetes mellitus: Patient with a history of type 2 diabetes mellitus on glipizide 10mg  bid and Lantus 30U qHS presenting with multiple episodes of hypoglycemia. Patient notes that he was in usual state of health until Friday morning when he was tremulous and diaphoretic with CBG 34. EMS was called and patient given oral glucose with improvement. He notes similar episode this morning with diaphoresis and low blood glucose on check. He notes that he took Insulin 20U on Thursday evening and has been taking his glipizide twice daily. Patient denies any fevers or chills or recent sick contacts, and without leukocytosis on labs - unlikely that patient's hypoglycemic episodes are secondary to infectious source. Suspect that his hypoglycemia is from too much long acting insulin.  - CBG monitoring - D50 prn for hypoglycemic episodes - Recommend discontinuation of glipizide at discharge  HFrEF: Hypertension: Patient with history of HFrEF with EF<20% s/p ICD placement. He had recent admission on 3/26 atrial flutter and acute CHF exacerbation. He was diuresed with IV Lasix and converted to NSR with amiodarone. Patient started on milrinone for cardiogenic shock and has been continued on discharge. He follows with HF clinic. Patient appears euvolemic on examination today.  - Amiodarone 200mg  bid - Digoxin 0.0625mg  daily - Losartan 12.5mg  bid - IV Milrinone 0.140mcg/kg/min - Cardiac monitoring  - Co-ox monitoring  - Strict I/O   Hyperlipidemia: Continue atorvastatin 80mg  daily  Diet: HH/Carb modified Fluids: None Electrolytes: Monitor and replete prn  DVT Prophylaxis: Eliquis Code status: FULL CODE  Dispo: Admit patient to Observation with expected length of stay less than 2 midnights.  Signed: [name], MD  Internal Medicine, PGY-1 08/03/2019, 4:29 PM  Pager: [phone]

## 2019-08-03 NOTE — Hospital Course (Addendum)
Admitted 08/03/2019  Allergies: Patient has no known allergies. Pertinent Hx: DM and HLD.***CAD s/p CABG in 2001,CHF EF 30-35% s/p ICD, AAA s/p EVAR  74 y.o. male p/w ***  * ***Hypoglycemia: Symptomatic hypiglycemia in past 2 night. Received d10 drip in ED.  CBG 62> 52 on arrival On glargin 30 unitQD. Metformin 1000 mg BID and Glipizide 10 mg BID.  *CHF: History of recent hospitalization for acute on chronic heart failure and cardiogenic shock.  On milrinone, digoxin, losartan (no aspirin lactone due to hyper K and no beta-blocker with low output).  Dig level pending.  Potassium 5.1.  *Hx of A. fib-flutter, persistent on amiodarone and Eliquis  *CKD**: Creatinine stable at 1.2 Home Meds: Eliquis, amiodarone, digoxin, Lantus 30 units, losartan, Metformin, Lipitor, albuterol milrinone Consults: ***  Meds: *** VTE ppx: Lovenox IVF: D10 Diet: HH CM   (no spironolacton due to hyper K and no beta-blocker with low output).

## 2019-08-03 NOTE — ED Notes (Addendum)
Pt has consumed 2 x 4oz of apple juice. Pt educated on need for complete meal but he stated that he did not bring his dentures and cannot eat anything solid at this time.  Offered graham crackers softened with milk. Pt refused due to his dislike of milk

## 2019-08-04 DIAGNOSIS — I110 Hypertensive heart disease with heart failure: Secondary | ICD-10-CM | POA: Diagnosis not present

## 2019-08-04 DIAGNOSIS — I714 Abdominal aortic aneurysm, without rupture: Secondary | ICD-10-CM

## 2019-08-04 DIAGNOSIS — Z794 Long term (current) use of insulin: Secondary | ICD-10-CM

## 2019-08-04 DIAGNOSIS — Z95810 Presence of automatic (implantable) cardiac defibrillator: Secondary | ICD-10-CM

## 2019-08-04 DIAGNOSIS — E11649 Type 2 diabetes mellitus with hypoglycemia without coma: Secondary | ICD-10-CM | POA: Diagnosis not present

## 2019-08-04 DIAGNOSIS — Z79899 Other long term (current) drug therapy: Secondary | ICD-10-CM

## 2019-08-04 DIAGNOSIS — I255 Ischemic cardiomyopathy: Secondary | ICD-10-CM

## 2019-08-04 DIAGNOSIS — I5020 Unspecified systolic (congestive) heart failure: Secondary | ICD-10-CM

## 2019-08-04 DIAGNOSIS — I2510 Atherosclerotic heart disease of native coronary artery without angina pectoris: Secondary | ICD-10-CM

## 2019-08-04 DIAGNOSIS — Z951 Presence of aortocoronary bypass graft: Secondary | ICD-10-CM

## 2019-08-04 DIAGNOSIS — E785 Hyperlipidemia, unspecified: Secondary | ICD-10-CM

## 2019-08-04 LAB — CBC
HCT: 38.1 % — ABNORMAL LOW (ref 39.0–52.0)
Hemoglobin: 12.3 g/dL — ABNORMAL LOW (ref 13.0–17.0)
MCH: 30.7 pg (ref 26.0–34.0)
MCHC: 32.3 g/dL (ref 30.0–36.0)
MCV: 95 fL (ref 80.0–100.0)
Platelets: 292 10*3/uL (ref 150–400)
RBC: 4.01 MIL/uL — ABNORMAL LOW (ref 4.22–5.81)
RDW: 14.7 % (ref 11.5–15.5)
WBC: 7.4 10*3/uL (ref 4.0–10.5)
nRBC: 0 % (ref 0.0–0.2)

## 2019-08-04 LAB — GLUCOSE, CAPILLARY
Glucose-Capillary: 137 mg/dL — ABNORMAL HIGH (ref 70–99)
Glucose-Capillary: 148 mg/dL — ABNORMAL HIGH (ref 70–99)
Glucose-Capillary: 149 mg/dL — ABNORMAL HIGH (ref 70–99)
Glucose-Capillary: 154 mg/dL — ABNORMAL HIGH (ref 70–99)
Glucose-Capillary: 200 mg/dL — ABNORMAL HIGH (ref 70–99)
Glucose-Capillary: 216 mg/dL — ABNORMAL HIGH (ref 70–99)
Glucose-Capillary: 225 mg/dL — ABNORMAL HIGH (ref 70–99)
Glucose-Capillary: 282 mg/dL — ABNORMAL HIGH (ref 70–99)
Glucose-Capillary: 303 mg/dL — ABNORMAL HIGH (ref 70–99)

## 2019-08-04 LAB — COMPREHENSIVE METABOLIC PANEL
ALT: 28 U/L (ref 0–44)
AST: 26 U/L (ref 15–41)
Albumin: 3.4 g/dL — ABNORMAL LOW (ref 3.5–5.0)
Alkaline Phosphatase: 36 U/L — ABNORMAL LOW (ref 38–126)
Anion gap: 9 (ref 5–15)
BUN: 18 mg/dL (ref 8–23)
CO2: 23 mmol/L (ref 22–32)
Calcium: 9.7 mg/dL (ref 8.9–10.3)
Chloride: 102 mmol/L (ref 98–111)
Creatinine, Ser: 1.36 mg/dL — ABNORMAL HIGH (ref 0.61–1.24)
GFR calc Af Amer: >60 mL/min (ref >60)
GFR calc non Af Amer: 52 mL/min — ABNORMAL LOW (ref >60)
Glucose, Bld: 249 mg/dL — ABNORMAL HIGH (ref 70–99)
Potassium: 5.3 mmol/L — ABNORMAL HIGH (ref 3.5–5.1)
Sodium: 134 mmol/L — ABNORMAL LOW (ref 135–145)
Total Bilirubin: 0.4 mg/dL (ref 0.3–1.2)
Total Protein: 6.8 g/dL (ref 6.5–8.1)

## 2019-08-04 LAB — COOXEMETRY PANEL
Carboxyhemoglobin: 0.8 % (ref 0.5–1.5)
Methemoglobin: 1 % (ref 0.0–1.5)
O2 Saturation: 60 %
Total hemoglobin: 11.8 g/dL — ABNORMAL LOW (ref 12.0–16.0)

## 2019-08-04 MED ORDER — MILRINONE LACTATE IN DEXTROSE 20-5 MG/100ML-% IV SOLN: 0.1250 ug/kg/min | INTRAVENOUS | 0 refills | Status: DC

## 2019-08-04 MED ORDER — INSULIN GLARGINE 100 UNIT/ML ~~LOC~~ SOLN
20.0000 [IU] | Freq: Every day | SUBCUTANEOUS | Status: DC
  Administered 2019-08-04 – 2019-08-05 (×2): 20 [IU] via SUBCUTANEOUS
  Filled 2019-08-04 (×2): qty 0.2

## 2019-08-04 MED ORDER — SODIUM ZIRCONIUM CYCLOSILICATE 5 G PO PACK
5.0000 g | PACK | Freq: Once | ORAL | Status: AC
  Administered 2019-08-04: 5 g via ORAL
  Filled 2019-08-04: qty 1

## 2019-08-04 MED ORDER — LANTUS SOLOSTAR 100 UNIT/ML ~~LOC~~ SOPN: 20.0000 [IU] | PEN_INJECTOR | Freq: Every day | SUBCUTANEOUS | 0 refills | Status: DC

## 2019-08-04 NOTE — Plan of Care (Signed)
  Problem: Clinical Measurements: Goal: Ability to maintain clinical measurements within normal limits will improve Outcome: Completed/Met Goal: Will remain free from infection Outcome: Completed/Met Goal: Diagnostic test results will improve Outcome: Completed/Met Goal: Respiratory complications will improve Outcome: Completed/Met   Problem: Activity: Goal: Risk for activity intolerance will decrease Outcome: Completed/Met   Problem: Nutrition: Goal: Adequate nutrition will be maintained Outcome: Completed/Met   Problem: Coping: Goal: Level of anxiety will decrease Outcome: Completed/Met

## 2019-08-04 NOTE — Care Management Obs Status (Signed)
[facility] NOTIFICATION   Patient Details  Name: Jordan Ross MRN: [id number] Date of Birth: 07-01-1945   Medicare Observation Status Notification Given:  Yes    Carles Collet, RN 08/04/2019, 4:37 PM

## 2019-08-04 NOTE — Progress Notes (Addendum)
Subjective: [name], no acute events reported.  This morning, patient evaluated at bedside. He reports feeling well this morning. He denies any further hypoglycemic episodes and is in good spirits. Discussed discontinuing glipizide on discharge and decreasing lantus. Patient expresses understanding.   Objective:  Vital signs in last 24 hours: Vitals:   08/04/19 0036 08/04/19 0444 08/04/19 0447 08/04/19 0810  BP: (!) 90/58 103/71  100/70  Pulse: 68 73  67  Resp: 18 17  20   Temp: (!) 97.4 F (36.3 C) 97.6 F (36.4 C)  97.9 F (36.6 C)  TempSrc: Oral Oral  Oral  SpO2: 99% 99%  95%  Weight:   75.1 kg   Height:       CBC Latest Ref Rng & Units 08/03/2019 07/29/2019 07/28/2019  WBC 4.0 - 10.5 K/uL 9.5 6.3 5.5  Hemoglobin 13.0 - 17.0 g/dL 13.2 11.7(L) 12.1(L)  Hematocrit 39.0 - 52.0 % 42.2 36.1(L) 38.3(L)  Platelets 150 - 400 K/uL 331 306 319   BMP Latest Ref Rng & Units 08/03/2019 07/29/2019 07/29/2019  Glucose 70 - 99 mg/dL 37(LL) 78 -  BUN 8 - 23 mg/dL 13 13 -  Creatinine 0.61 - 1.24 mg/dL 1.27(H) 1.29(H) -  BUN/Creat Ratio 10 - 24 - - -  Sodium 135 - 145 mmol/L 137 136 -  Potassium 3.5 - 5.1 mmol/L 5.1 4.5 5.1  Chloride 98 - 111 mmol/L 103 108 -  CO2 22 - 32 mmol/L 25 24 -  Calcium 8.9 - 10.3 mg/dL 10.0 8.9 -   CBG (last 3)  Recent Labs    08/04/19 0434 08/04/19 0625 08/04/19 1036  GLUCAP 137* 149* 225*    Constitutional: Pleasant male, no acute distress HENT: [place]/AT, EOMI nl Cardiovascular: RRR, nl S1 and S2, no m/r/g Respiratory: no respiratory distress, minimal wheezing, rhonchi or rales GI/Abdomen: non-distended, soft, nontender, normoactive bowel sounds Neurologic: awake, alert and oriented x3, no obvious focal deficits noted MSK: spontaneously moving all extremities, bilateral lower extremities nonedematous Skin: nondiaphoretic, warm and dry   Assessment/Plan:  Active Problems:   Hypoglycemia Jordan Ross is a 74 year old male with a PMHx of CAD  s/p CABG in 2001, hypertension, ischemic cardiomyopathy, HFrEF (EF <20%) s/p St. Jude single chamber ICD, AAA s/p EVAR, diabetes mellitus and hyperlipidemia presenting with persistent hypoglycemia.  Hypoglycemia Type 2 diabetes mellitus: Patient with a history of type 2 diabetes mellitus on glipizide 10mg  bid and Lantus 30U qHS presenting with persistent hypoglycemia for 2 days in setting of glipizide, lantus and metformin use. Patient observed overnight with CBGs 70-150. Patient asymptomatic. Recommend that patient discontinue glipizide on discharge and decrease his lantus dose as this can percipitate hypoglycemic episodes.  - Continue CBG monitoring - SSI  - Recommend discontinuation of glipizide at discharge - Decrease lantus dose at discharge - Recommend follow up with PCP for diabetes management   HFrEF: Hypertension: Patient with history of HFrEF with EF<20% s/p ICD placement. He had recent admission on 3/26 atrial flutter and acute CHF exacerbation requiring milrinone infusion for cardiogenic shock. Patient follows with HF clinic. He is on milrinone infusion and has a [facility] RN that refills his milrinone pump every Tuesday. Will discuss with pharmacy regarding milrinone dosing and if he needs another bag on discharge.  Discussed with pharmacy and case manager. Patient does not have enough milrinone in current bag to last until Tuesday. Patient's infusion company unavailable until tomorrow to switch patient back to home supply and home pump of milrinone. Patient  will require hospitalization for continued milrinone infusion.  - Amiodarone 200mg  bid - Digoxin 0.0625mg  daily - Losartan 12.5mg  bid - IV Milrinone 0.161mcg/kg/min  Diet: HH/Carb modified Fluids: None Electrolytes: Monitor and replete prn  DVT Prophylaxis: Eliquis Code status: FULL CODE  Prior to Admission Living Arrangement: Home Anticipated Discharge Location: Home Barriers to Discharge: Unsafe discharge  Dispo:  Anticipated discharge in approximately 0-1 day(s).   [name], MD  Internal Medicine, PGY-1 08/04/2019, 10:53 AM Pager: [phone]

## 2019-08-04 NOTE — Care Management (Addendum)
11:30 Patient active w [facility] for home health services and will resume after DC. Milrinone provided through [facility]. Rx faxed to [phone]  Assessing start date of [facility] services and quantity at home of medication currently.  DO NOT [place] [phone]  12:00  Confirmed that [facility] is planned to see patient Tuesday. Patient does not have enough Milrinone in current bag to last until [place] visit w [facility].  Also, [facility] will be available Monday morning to switch Milrinone to home supply and home pump, they are not available today. Therefore patient will need to stay until Monday. This has been explained to patient.

## 2019-08-04 NOTE — Plan of Care (Signed)

## 2019-08-05 DIAGNOSIS — I5020 Unspecified systolic (congestive) heart failure: Secondary | ICD-10-CM | POA: Diagnosis not present

## 2019-08-05 DIAGNOSIS — I5022 Chronic systolic (congestive) heart failure: Secondary | ICD-10-CM | POA: Diagnosis not present

## 2019-08-05 DIAGNOSIS — E11649 Type 2 diabetes mellitus with hypoglycemia without coma: Secondary | ICD-10-CM | POA: Diagnosis not present

## 2019-08-05 DIAGNOSIS — R570 Cardiogenic shock: Secondary | ICD-10-CM | POA: Diagnosis not present

## 2019-08-05 DIAGNOSIS — I2510 Atherosclerotic heart disease of native coronary artery without angina pectoris: Secondary | ICD-10-CM | POA: Diagnosis not present

## 2019-08-05 DIAGNOSIS — I110 Hypertensive heart disease with heart failure: Secondary | ICD-10-CM | POA: Diagnosis not present

## 2019-08-05 LAB — BASIC METABOLIC PANEL
Anion gap: 9 (ref 5–15)
BUN: 18 mg/dL (ref 8–23)
CO2: 25 mmol/L (ref 22–32)
Calcium: 9.7 mg/dL (ref 8.9–10.3)
Chloride: 102 mmol/L (ref 98–111)
Creatinine, Ser: 1.21 mg/dL (ref 0.61–1.24)
GFR calc Af Amer: >60 mL/min (ref >60)
GFR calc non Af Amer: 60 mL/min — ABNORMAL LOW (ref >60)
Glucose, Bld: 212 mg/dL — ABNORMAL HIGH (ref 70–99)
Potassium: 4.4 mmol/L (ref 3.5–5.1)
Sodium: 136 mmol/L (ref 135–145)

## 2019-08-05 LAB — COOXEMETRY PANEL
Carboxyhemoglobin: 1.4 % (ref 0.5–1.5)
Methemoglobin: 1.3 % (ref 0.0–1.5)
O2 Saturation: 60.2 %
Total hemoglobin: 12.8 g/dL (ref 12.0–16.0)

## 2019-08-05 LAB — GLUCOSE, CAPILLARY
Glucose-Capillary: 192 mg/dL — ABNORMAL HIGH (ref 70–99)
Glucose-Capillary: 176 mg/dL — ABNORMAL HIGH (ref 70–99)

## 2019-08-05 MED ORDER — LANTUS SOLOSTAR 100 UNIT/ML ~~LOC~~ SOPN: 25.0000 [IU] | PEN_INJECTOR | Freq: Every day | SUBCUTANEOUS | 0 refills | Status: DC

## 2019-08-05 NOTE — Progress Notes (Signed)
Subjective: HD2 Overnight, no acute events reported.  Pt seen at the bedside. Pt states that he is ready to go home today. No complaints at this time. All concerns were addressed.   Objective:  Vital signs in last 24 hours: Vitals:   08/04/19 1329 08/04/19 1931 08/05/19 0128 08/05/19 0237  BP: 102/70 (!) 98/59  98/62  Pulse: 74 80  81  Resp: 20 18  18   Temp: 98.2 F (36.8 C) 98 F (36.7 C)  98.5 F (36.9 C)  TempSrc: Oral Oral  Oral  SpO2: 97% 97%  98%  Weight:   77.1 kg   Height:       CBC Latest Ref Rng & Units 08/04/2019 08/03/2019 07/29/2019  WBC 4.0 - 10.5 K/uL 7.4 9.5 6.3  Hemoglobin 13.0 - 17.0 g/dL 12.3(L) 13.2 11.7(L)  Hematocrit 39.0 - 52.0 % 38.1(L) 42.2 36.1(L)  Platelets 150 - 400 K/uL 292 331 306   BMP Latest Ref Rng & Units 08/05/2019 08/04/2019 08/03/2019  Glucose 70 - 99 mg/dL 212(H) 249(H) 37(LL)  BUN 8 - 23 mg/dL 18 18 13   Creatinine 0.61 - 1.24 mg/dL 1.21 1.36(H) 1.27(H)  BUN/Creat Ratio 10 - 24 - - -  Sodium 135 - 145 mmol/L 136 134(L) 137  Potassium 3.5 - 5.1 mmol/L 4.4 5.3(H) 5.1  Chloride 98 - 111 mmol/L 102 102 103  CO2 22 - 32 mmol/L 25 23 25   Calcium 8.9 - 10.3 mg/dL 9.7 9.7 10.0   CBG (last 3)  Recent Labs    08/04/19 2023 08/04/19 2356 08/05/19 0406  GLUCAP 303* 282* 192*    Constitutional: Pleasant male, no acute distress HENT: [place]/AT, EOMI nl Cardiovascular: RRR, nl S1 and S2, no m/r/g Respiratory: no respiratory distress, minimal wheezing, rhonchi or rales GI/Abdomen: non-distended, soft, nontender, normoactive bowel sounds Neurologic: awake, alert and oriented x3, no obvious focal deficits noted MSK: spontaneously moving all extremities, bilateral lower extremities nonedematous Skin: nondiaphoretic, warm and dry   Assessment/Plan: [name] is a 74 year old male with a PMHx of CAD s/p CABG in 2001, hypertension, ischemic cardiomyopathy, HFrEF (EF <20%) s/p St. Jude single chamber ICD, AAA s/p EVAR, diabetes mellitus and  hyperlipidemia presenting with persistent hypoglycemia.  Hypoglycemia Type 2 diabetes mellitus: Patient with a history of type 2 diabetes mellitus on glipizide 10mg  bid and Lantus 30U qHS presenting with persistent hypoglycemia for 2 days in setting of glipizide, lantus and metformin use. Patient observed in hospital for CBG monitoring on SSI and noted to have CBG >300 for which given Lantus 20U overnight with improvement of AM CBG to 176. Recommend that patient discontinue glipizide on discharge and decrease his lantus dose as this can percipitate hypoglycemic episodes.  - Recommend discontinuation of glipizide at discharge - Decrease lantus dose at discharge - Recommend follow up with PCP for diabetes management   HFrEF: Hypertension: Patient with history of HFrEF with EF<20% s/p ICD placement. He had recent admission on 3/26 atrial flutter and acute CHF exacerbation requiring milrinone infusion for cardiogenic shock. Patient follows with HF clinic. He is on milrinone infusion and has a [facility] RN that refills his milrinone pump every Tuesday. Patient to continue milrinone gtt on discharge.  - Amiodarone 200mg  bid - Digoxin 0.0625mg  daily - Losartan 12.5mg  bid - IV Milrinone 0.134mcg/kg/min  Diet: HH/Carb modified Fluids: None Electrolytes: Monitor and replete prn  DVT Prophylaxis: Eliquis Code status: FULL CODE  Prior to Admission Living Arrangement: Home Anticipated Discharge Location: Home Barriers to Discharge: None  Dispo: Anticipated discharge today  [name], MD  Internal Medicine, PGY-1 08/05/2019, 6:48 AM Pager: [phone]

## 2019-08-05 NOTE — Discharge Summary (Addendum)
Name: Jordan Ross MRN: [id number] DOB: 1946-02-06 74 y.o. PCP: Jordan School, MD  Date of Admission: 08/03/2019  4:39 AM Date of Discharge: 08/05/2019 Attending Physician: Jordan Kilts, MD  Discharge Diagnosis: 1. Hypoglycemia  2. HFrEF  Discharge Medications: Allergies as of 08/05/2019   No Known Allergies      Medication List     STOP taking these medications    glipiZIDE 10 MG tablet Commonly known as: GLUCOTROL       TAKE these medications    albuterol 108 (90 Base) MCG/ACT inhaler Commonly known as: VENTOLIN HFA Inhale 1-2 puffs into the lungs every 4 (four) hours as needed for wheezing or shortness of breath.   amiodarone 200 MG tablet Commonly known as: PACERONE Take 200 mg twice a day x 10 days then 200 mg dialy What changed:  how much to take how to take this when to take this additional instructions   apixaban 5 MG Tabs tablet Commonly known as: ELIQUIS Take 1 tablet (5 mg total) by mouth 2 (two) times daily.   apixaban 5 MG Tabs tablet Commonly known as: ELIQUIS Take 1 tablet (5 mg total) by mouth 2 (two) times daily.   atorvastatin 80 MG tablet Commonly known as: LIPITOR Take 80 mg by mouth daily.   digoxin 0.125 MG tablet Commonly known as: LANOXIN Take 0.0625 mg by mouth daily. What changed: Another medication with the same name was removed. Continue taking this medication, and follow the directions you see here.   ferrous sulfate 325 (65 FE) MG tablet Take 325 mg by mouth daily with breakfast.   glucose blood test strip Use as instructed What changed:  how much to take how to take this when to take this   glucose blood test strip Commonly known as: FORA V10 Blood Glucose Test Use as instructed bid. E11.65 What changed:  how much to take how to take this when to take this additional instructions   Lancets Ultra Thin Misc Test BG every morning What changed:  how much to take how to take this when to take  this additional instructions   FORA Lancets Misc Test bid as directed. E11.65. What changed:  how much to take how to take this when to take this additional instructions   Lantus SoloStar 100 UNIT/ML Solostar Pen Generic drug: insulin glargine Inject 25 Units into the skin at bedtime. What changed:  how much to take Another medication with the same name was removed. Continue taking this medication, and follow the directions you see here.   Litetouch Pen Needles 31G X 8 MM Misc Generic drug: Insulin Pen Needle USE AT BEDTIME AS DIRECTED. What changed: See the new instructions.   losartan 25 MG tablet Commonly known as: COZAAR Take 0.5 tablets (12.5 mg total) by mouth 2 (two) times daily.   metFORMIN 500 MG tablet Commonly known as: GLUCOPHAGE Take 1,000 mg by mouth 2 (two) times daily with a meal.   milrinone 20 MG/100 ML Soln infusion Commonly known as: PRIMACOR Inject 0.0094 mg/min into the vein continuous. What changed:  how much to take additional instructions how fast to infuse this   Milrinone Lactate 50 MG/50ML Soln 0.125 mcg/kg/min by Intravenous (Continuous Infusion) route continuous. Set pump at 0.33ml/hr  Wt=75kg   multivitamin with minerals Tabs tablet Take 1 tablet by mouth daily.   omeprazole 20 MG tablet Commonly known as: PRILOSEC OTC Take 20 mg by mouth daily.   oxymetazoline 0.05 % nasal spray Commonly known  as: AFRIN Place 2 sprays into both nostrils 2 (two) times daily as needed for congestion.   sennosides-docusate sodium 8.6-50 MG tablet Commonly known as: SENOKOT-S Take 1 tablet by mouth daily.        Disposition and follow-up:   Jordan Ross was discharged from [facility] in Stable condition.  At the hospital follow up visit please address:  1.  Hypoglycemia: Patient to discontinue glipizide on discharge and decrease lantus to 25U daily due to risk of hypoglycemia. If further glucose control needed, can  consider SGLT-2 inhibitor for benefit in HFrEF  HFrEF: Continue HF meds (milrinone gtt, digoxin and losartan) and f/u with HF clinic to titrate off milrinone gtt  2.  Labs / imaging needed at time of follow-up: CBG, BMP, HbA1c in 3 months  3.  Pending labs/ test needing follow-up: None  Follow-up Appointments: Follow-up Information     Jordan School, MD. Go on 08/07/2019.   Specialty: Internal Medicine Why: Please follow up in a week@1 :30pm;please arrive @1 :00pm;The front door will be open Contact information: [place] [phone]         [facility] [place] Follow up.   Why: for resumption of home health services  Contact information: [place] [address] [phone]            Hospital Course by problem list: 1. Persistent hypoglycemia  Type 2 DM: Patient with a history of type 2 diabetes mellitus on glipizide 10mg  bid and Lantus 30U qHS presenting with persistent hypoglycemia for 2 days. Patient observed in hospital for CBG monitoring on SSI and noted to have CBG >300 for which given Lantus 20U overnight with improvement of AM CBG to 176. Recommend that patient discontinue glipizide on discharge and decrease his lantus dose to 25U daily. Would recommend SGLT-2 inhibitor for diabetes management if needed. Patient to follow up with PCP and for HbA1c in 3 months.   2. HFrEF with recent cardiogenic shock  Patient with history of HFrEF with EF<20% s/p ICD placement. He had recent admission on 3/26 atrial flutter and acute CHF exacerbation requiring milrinone infusion for cardiogenic shock. Patient follows with HF clinic. He is on milrinone infusion and has a [facility] RN that refills his milrinone pump every Tuesday. Patient did not have enough milrinone in current bag to last until Tuesday. Patient's infusion company unavailable until Monday to switch to home supply and home pump of milrinone. Patient  required extended hospitalization to continue milrinone infusion. He is to follow up with the HF clinic for titration of milrinone gtt.   Discharge Vitals:   BP 106/72   Pulse 76   Temp 98.5 F (36.9 C) (Oral)   Resp 18   Ht 6' (1.829 m)   Wt 77.1 kg   SpO2 98%   BMI 23.04 kg/m   Pertinent Labs, Studies, and Procedures:  CBC Latest Ref Rng & Units 08/04/2019 08/03/2019 07/29/2019  WBC 4.0 - 10.5 K/uL 7.4 9.5 6.3  Hemoglobin 13.0 - 17.0 g/dL 12.3(L) 13.2 11.7(L)  Hematocrit 39.0 - 52.0 % 38.1(L) 42.2 36.1(L)  Platelets 150 - 400 K/uL 292 331 306   CMP Latest Ref Rng & Units 08/05/2019 08/04/2019 08/03/2019  Glucose 70 - 99 mg/dL 212(H) 249(H) 37(LL)  BUN 8 - 23 mg/dL 18 18 13   Creatinine 0.61 - 1.24 mg/dL 1.21 1.36(H) 1.27(H)  Sodium 135 - 145 mmol/L 136 134(L) 137  Potassium 3.5 - 5.1 mmol/L 4.4  5.3(H) 5.1  Chloride 98 - 111 mmol/L 102 102 103  CO2 22 - 32 mmol/L 25 23 25   Calcium 8.9 - 10.3 mg/dL 9.7 9.7 10.0  Total Protein 6.5 - 8.1 g/dL - 6.8 7.2  Total Bilirubin 0.3 - 1.2 mg/dL - 0.4 0.3  Alkaline Phos 38 - 126 U/L - 36(L) 37(L)  AST 15 - 41 U/L - 26 34  ALT 0 - 44 U/L - 28 30   Lab Results  Component Value Date   TOTHGB 12.8 08/05/2019   O2SAT 60.2 08/05/2019   CARBOXYHGB 1.4 08/05/2019   METHGB 1.3 08/05/2019    Discharge Instructions: Discharge Instructions     (HEART FAILURE PATIENTS) Call MD:  Anytime you have any of the following symptoms: 1) 3 pound weight gain in 24 hours or 5 pounds in 1 week 2) shortness of breath, with or without a dry hacking cough 3) swelling in the hands, feet or stomach 4) if you have to sleep on extra pillows at night in order to breathe.   Complete by: As directed    Call MD for:  difficulty breathing, headache or visual disturbances   Complete by: As directed    Call MD for:  extreme fatigue   Complete by: As directed    Call MD for:  hives   Complete by: As directed    Call MD for:  persistant dizziness or light-headedness    Complete by: As directed    Call MD for:  persistant nausea and vomiting   Complete by: As directed    Call MD for:  severe uncontrolled pain   Complete by: As directed    Call MD for:  temperature >100.4   Complete by: As directed    Diet - low sodium heart healthy   Complete by: As directed    Discharge instructions   Complete by: As directed    Mr. Poul, [place] were admitted to the hospital for persistently low blood sugar levels. During your admission, you were observed with frequent blood glucose checks. We suspect that your low blood sugars were secondary to your medication interactions. On discharge, please discontinue your glipizide at this time. Also recommend to decrease your lantus to 25U at this time. Continue to monitor your blood sugars at home and adjust your insulin accordingly.   Please schedule a follow up appointment with your PCP.  Please continue to take all other medications as prescribed.   Thank you!   Increase activity slowly   Complete by: As directed        Signed: [name], MD 08/05/2019, 10:49 AM   Pager: [phone]

## 2019-08-05 NOTE — Progress Notes (Signed)
Patient discharged: Home with family  Via: Wheelchair   Discharge paperwork given: to patient and family  Reviewed with teach back  Telemetry disconnected  Belongings given to patient  IV medication set by [facility] RN before d/c

## 2019-08-06 NOTE — ED Provider Notes (Signed)
Emergency Department Provider Note   I have reviewed the triage vital signs and the nursing notes.   HISTORY  Chief Complaint Hypoglycemia   HPI Jordan Ross is a 74 y.o. male who presents to the ED for altered mental status. Was recentlyl in hospital and started on multiple medications for heart failure and bp. Last two nights has had significant hypoglycemic episodes with cbg's as low as mid 30's. Happened again tonight. Brought here. No other new symptoms. No other new meds aside from digoxin, milrinone, amiodarone and losartan.    No other associated or modifying symptoms.    Past Medical History:  Diagnosis Date  . Abdominal aortic aneurysm ([place])   . AICD (automatic cardioverter/defibrillator) present    St Jude  . Anemia    iron deficiency anemia,takes iron pill daily  . Arthritis   . Coronary artery disease    AWMI 2001  . Diverticulosis   . GERD (gastroesophageal reflux disease)    takes Protonix daily  . History of colon polyps    benign  . History of migraine 40 yrs ago  . History of shingles   . Hyperlipidemia    takes Atorvastatin and Niacin daily  . Hypertension    takes Diovan,Spironolactone, and Carvedilol daily  . ICD (implantable cardioverter-defibrillator), single, in situ   . Ischemic cardiomyopathy   . Joint swelling   . LV dysfunction    takes Digoxin daily  . Myocardial infarction ([place]) 2001  . Pneumonia 40 yrs ago   hx of  . Shortness of breath dyspnea    with exertion.States he was a smoker for yr  . Status post coronary artery bypass grafting   . Type 2 diabetes mellitus (HCC)    takes Glipizide,Metformin,Invokana,and Lantus daily.Average fasting blood sugar runs about 140    Patient Active Problem List   Diagnosis Date Noted  . Hypoglycemia 08/03/2019  . Atrial fibrillation with RVR ([place]) 07/12/2019  . Chronic systolic heart failure ([place]) 12/17/2015  . AAA (abdominal aortic aneurysm) ([place]) 05/22/2015  . Coronary artery  disease 03/29/2013  . Cardiomyopathy, ischemic 03/29/2013  . ICD (implantable cardioverter-defibrillator), single, in situ 03/29/2013  . Essential hypertension 03/29/2013  . Hyperlipidemia 03/29/2013  . Type 2 diabetes mellitus with vascular disease ([place]) 03/29/2013  . Abdominal aortic aneurysm ([place]) 03/29/2013  . Bilateral renal artery stenosis ([place]) 03/29/2013    Past Surgical History:  Procedure Laterality Date  . ABDOMINAL AORTIC ENDOVASCULAR STENT GRAFT N/A 05/22/2015   Procedure: ABDOMINAL AORTIC ENDOVASCULAR STENT GRAFT;  Surgeon: Serafina Mitchell, MD;  Location: [facility];  Service: Vascular;  Laterality: N/A;  . CARDIAC CATHETERIZATION  2001  . COLONOSCOPY N/A 03/21/2014   Procedure: COLONOSCOPY;  Surgeon: Rogene Houston, MD;  [facility];  Service: Endoscopy;  Laterality: N/A;  855 - moved to 12/4 @ 8:30 - Ann notified pt  . CORONARY ARTERY BYPASS GRAFT  2001   unsure if it was 3 or 4  . DOPPLER ECHOCARDIOGRAPHY     2011 38% EF.2013 EF 30-35%  . IR FLUORO GUIDE CV LINE RIGHT  07/26/2019  . IR US GUIDE VASC ACCESS RIGHT  07/26/2019  . LEFT HEART CATH AND CORS/GRAFTS ANGIOGRAPHY N/A 07/29/2019   Procedure: LEFT HEART CATH AND CORS/GRAFTS ANGIOGRAPHY;  Surgeon: Larey Dresser, MD;  Location: [facility];  Service: Cardiovascular;  Laterality: N/A;    Current Outpatient Rx  . Order #: [id number] Class: Historical Med  . Order #: [id number] Class: Normal  .  Order #: [id number] Class: Normal  . Order #: [id number] Class: Historical Med  . Order #: [id number] Class: Historical Med  . Order #: [id number] Class: Historical Med  . Order #: [id number] Class: Normal  . Order #: [id number] Class: Historical Med  . Order #: [id number] Class: Historical Med  . Order #: [id number] Class: Historical Med  . Order #: [id number] Class: Historical Med  . Order #: [id number] Class: Historical Med  . Order #: [id number] Class: Historical Med  . Order #: [id number] Class: Normal  . Order #: [id number] Class:  Normal  . Order #: [id number] Class: Normal  . Order #: [id number] Class: Normal  . Order #: [id number] Class: Normal  . Order #: [id number] Class: Normal  . Order #: [id number] Class: Normal  . Order #: [id number] Class: Normal    Allergies Patient has no known allergies.  Family History  Problem Relation Age of Onset  . COPD Mother   . Heart disease Brother     Social History Social History   Tobacco Use  . Smoking status: Former Smoker    Years: 40.00  . Smokeless tobacco: Never Used  . Tobacco comment: quit smoking in 2001  Substance Use Topics  . Alcohol use: No  . Drug use: No    Review of Systems  All other systems negative except as documented in the HPI. All pertinent positives and negatives as reviewed in the HPI. ____________________________________________   PHYSICAL EXAM:  VITAL SIGNS: ED Triage Vitals  Enc Vitals Group     BP 08/03/19 0440 126/84     Pulse Rate 08/03/19 0440 79     Resp 08/03/19 0440 20     Temp 08/03/19 0440 97.8 F (36.6 C)     Temp Source 08/03/19 0440 Oral     SpO2 08/03/19 0440 100 %     Weight 08/03/19 0440 170 lb (77.1 kg)     Height 08/03/19 0440 6' (1.829 m)    Constitutional: Alert and oriented. Well appearing and in no acute distress. Eyes: Conjunctivae are normal. PERRL. EOMI. Head: Atraumatic. Nose: No congestion/rhinnorhea. Mouth/Throat: Mucous membranes are moist.  Oropharynx non-erythematous. Neck: No stridor.  No meningeal signs.   Cardiovascular: Normal rate, regular rhythm. Good peripheral circulation. Grossly normal heart sounds.   Respiratory: Normal respiratory effort.  No retractions. Lungs CTAB. Gastrointestinal: Soft and nontender. No distention.  Musculoskeletal: No lower extremity tenderness nor edema. No gross deformities of extremities. Neurologic:  Normal speech and language. No gross focal neurologic deficits are appreciated.  Skin:  Skin is warm, dry and intact. No rash  noted.   ____________________________________________   LABS (all labs ordered are listed, but only abnormal results are displayed)  Labs Reviewed  CBC WITH DIFFERENTIAL/PLATELET - Abnormal; Notable for the following components:      Result Value   Eosinophils Absolute 0.7 (*)    All other components within normal limits  HEMOGLOBIN A1C - Abnormal; Notable for the following components:   Hgb A1c MFr Bld 8.6 (*)    All other components within normal limits  COMPREHENSIVE METABOLIC PANEL - Abnormal; Notable for the following components:   Glucose, Bld 37 (*)    Creatinine, Ser 1.27 (*)    Alkaline Phosphatase 37 (*)    GFR calc non Af Amer 56 (*)    All other components within normal limits  DIGOXIN LEVEL - Abnormal; Notable for the following components:   Digoxin Level 0.7 (*)    All other components within normal limits  URINALYSIS, ROUTINE W REFLEX MICROSCOPIC - Abnormal; Notable for the following components:   Color, Urine  COLORLESS (*)    Specific Gravity, Urine 1.003 (*)    All other components within normal limits  COMPREHENSIVE METABOLIC PANEL - Abnormal; Notable for the following components:   Sodium 134 (*)    Potassium 5.3 (*)    Glucose, Bld 249 (*)    Creatinine, Ser 1.36 (*)    Albumin 3.4 (*)    Alkaline Phosphatase 36 (*)    GFR calc non Af Amer 52 (*)    All other components within normal limits  CBC - Abnormal; Notable for the following components:   RBC 4.01 (*)    Hemoglobin 12.3 (*)    HCT 38.1 (*)    All other components within normal limits  COOXEMETRY PANEL - Abnormal; Notable for the following components:   Total hemoglobin 11.8 (*)    All other components within normal limits  GLUCOSE, CAPILLARY - Abnormal; Notable for the following components:   Glucose-Capillary 107 (*)    All other components within normal limits  GLUCOSE, CAPILLARY - Abnormal; Notable for the following components:   Glucose-Capillary 145 (*)    All other components within  normal limits  GLUCOSE, CAPILLARY - Abnormal; Notable for the following components:   Glucose-Capillary 148 (*)    All other components within normal limits  GLUCOSE, CAPILLARY - Abnormal; Notable for the following components:   Glucose-Capillary 154 (*)    All other components within normal limits  GLUCOSE, CAPILLARY - Abnormal; Notable for the following components:   Glucose-Capillary 137 (*)    All other components within normal limits  GLUCOSE, CAPILLARY - Abnormal; Notable for the following components:   Glucose-Capillary 149 (*)    All other components within normal limits  GLUCOSE, CAPILLARY - Abnormal; Notable for the following components:   Glucose-Capillary 225 (*)    All other components within normal limits  GLUCOSE, CAPILLARY - Abnormal; Notable for the following components:   Glucose-Capillary 200 (*)    All other components within normal limits  GLUCOSE, CAPILLARY - Abnormal; Notable for the following components:   Glucose-Capillary 216 (*)    All other components within normal limits  BASIC METABOLIC PANEL - Abnormal; Notable for the following components:   Glucose, Bld 212 (*)    GFR calc non Af Amer 60 (*)    All other components within normal limits  GLUCOSE, CAPILLARY - Abnormal; Notable for the following components:   Glucose-Capillary 303 (*)    All other components within normal limits  GLUCOSE, CAPILLARY - Abnormal; Notable for the following components:   Glucose-Capillary 282 (*)    All other components within normal limits  GLUCOSE, CAPILLARY - Abnormal; Notable for the following components:   Glucose-Capillary 192 (*)    All other components within normal limits  GLUCOSE, CAPILLARY - Abnormal; Notable for the following components:   Glucose-Capillary 176 (*)    All other components within normal limits  CBG MONITORING, ED - Abnormal; Notable for the following components:   Glucose-Capillary 64 (*)    All other components within normal limits  CBG  MONITORING, ED - Abnormal; Notable for the following components:   Glucose-Capillary 62 (*)    All other components within normal limits  CBG MONITORING, ED - Abnormal; Notable for the following components:   Glucose-Capillary 52 (*)    All other components within normal limits  CBG MONITORING, ED - Abnormal; Notable for the following components:   Glucose-Capillary 128 (*)    All other components within normal limits  CBG MONITORING,  ED - Abnormal; Notable for the following components:   Glucose-Capillary 114 (*)    All other components within normal limits  SARS CORONAVIRUS 2 (TAT 6-24 HRS)  COOXEMETRY PANEL  GLUCOSE, CAPILLARY  COOXEMETRY PANEL  CBG MONITORING, ED   ____________________________________________  EKG   EKG Interpretation  Date/Time:  Saturday August 03 2019 [id number] EDT Ventricular Rate:  80 PR Interval:    QRS Duration: 153 QT Interval:  383 QTC Calculation: 442 R Axis:   -60 Text Interpretation: Sinus rhythm Probable left atrial enlargement Left bundle branch block simiilar to previous Confirmed by Merrily Pew [phone]) on 08/03/2019 6:38:00 AM       ____________________________________________  RADIOLOGY  No results found.  ____________________________________________   PROCEDURES  Procedure(s) performed:   Procedures   ____________________________________________   INITIAL IMPRESSION / ASSESSMENT AND PLAN / ED COURSE  After discussion with pharmacist and web research it does appear that amiodarone can potentiate the effects of glipizide. As this has happened twice, continues to drop, D10 infusion started. Glipizide is a long acting medication and may take awhile to metabolize, so will observe in hospital for further medication management as well.      Pertinent labs & imaging results that were available during my care of the patient were reviewed by me and considered in my medical decision making (see chart for details).     ____________________________________________  FINAL CLINICAL IMPRESSION(S) / ED DIAGNOSES  Final diagnoses:  Hypoglycemia     MEDICATIONS GIVEN DURING THIS VISIT:  Medications  dextrose 50 % solution 50 mL (50 mLs Intravenous Given 08/03/19 0844)  pneumococcal 23 valent vaccine (PNEUMOVAX-23) injection 0.5 mL (0.5 mLs Intramuscular Given 08/04/19 0949)  sodium zirconium cyclosilicate (LOKELMA) packet 5 g (5 g Oral Given 08/04/19 1516)     NEW OUTPATIENT MEDICATIONS STARTED DURING THIS VISIT:  Discharge Medication List as of 08/05/2019 11:15 AM      Note:  This note was prepared with assistance of Dragon voice recognition software. Occasional wrong-word or sound-a-like substitutions may have occurred due to the inherent limitations of voice recognition software.   Merrily Pew, MD 08/06/19 [phone]

## 2019-08-07 DIAGNOSIS — K219 Gastro-esophageal reflux disease without esophagitis: Secondary | ICD-10-CM | POA: Diagnosis not present

## 2019-08-07 DIAGNOSIS — I509 Heart failure, unspecified: Secondary | ICD-10-CM | POA: Diagnosis not present

## 2019-08-07 DIAGNOSIS — E162 Hypoglycemia, unspecified: Secondary | ICD-10-CM | POA: Diagnosis not present

## 2019-08-07 DIAGNOSIS — Z6823 Body mass index (BMI) 23.0-23.9, adult: Secondary | ICD-10-CM | POA: Diagnosis not present

## 2019-08-08 ENCOUNTER — Encounter (HOSPITAL_COMMUNITY): Payer: Medicare Other

## 2019-08-09 ENCOUNTER — Encounter (HOSPITAL_COMMUNITY): Payer: Self-pay | Admitting: Internal Medicine

## 2019-08-09 ENCOUNTER — Other Ambulatory Visit: Payer: Self-pay

## 2019-08-09 ENCOUNTER — Ambulatory Visit (HOSPITAL_COMMUNITY)
Admit: 2019-08-09 | Discharge: 2019-08-09 | Disposition: A | Discharge status: Home / Self Care | Payer: Medicare Other | Attending: Internal Medicine | Admitting: Internal Medicine

## 2019-08-09 VITALS — BP 94/56 | HR 76 | Wt 172.4 lb

## 2019-08-09 DIAGNOSIS — N179 Acute kidney failure, unspecified: Secondary | ICD-10-CM | POA: Insufficient documentation

## 2019-08-09 DIAGNOSIS — K219 Gastro-esophageal reflux disease without esophagitis: Secondary | ICD-10-CM | POA: Diagnosis not present

## 2019-08-09 DIAGNOSIS — I252 Old myocardial infarction: Secondary | ICD-10-CM | POA: Diagnosis not present

## 2019-08-09 DIAGNOSIS — I255 Ischemic cardiomyopathy: Secondary | ICD-10-CM | POA: Insufficient documentation

## 2019-08-09 DIAGNOSIS — Z95810 Presence of automatic (implantable) cardiac defibrillator: Secondary | ICD-10-CM | POA: Insufficient documentation

## 2019-08-09 DIAGNOSIS — Z951 Presence of aortocoronary bypass graft: Secondary | ICD-10-CM | POA: Diagnosis not present

## 2019-08-09 DIAGNOSIS — E119 Type 2 diabetes mellitus without complications: Secondary | ICD-10-CM | POA: Diagnosis not present

## 2019-08-09 DIAGNOSIS — I5023 Acute on chronic systolic (congestive) heart failure: Secondary | ICD-10-CM | POA: Diagnosis not present

## 2019-08-09 DIAGNOSIS — Z79899 Other long term (current) drug therapy: Secondary | ICD-10-CM | POA: Diagnosis not present

## 2019-08-09 DIAGNOSIS — I4891 Unspecified atrial fibrillation: Secondary | ICD-10-CM | POA: Diagnosis not present

## 2019-08-09 DIAGNOSIS — M1990 Unspecified osteoarthritis, unspecified site: Secondary | ICD-10-CM | POA: Insufficient documentation

## 2019-08-09 DIAGNOSIS — E785 Hyperlipidemia, unspecified: Secondary | ICD-10-CM | POA: Diagnosis not present

## 2019-08-09 DIAGNOSIS — I447 Left bundle-branch block, unspecified: Secondary | ICD-10-CM | POA: Diagnosis not present

## 2019-08-09 DIAGNOSIS — I4892 Unspecified atrial flutter: Secondary | ICD-10-CM | POA: Diagnosis not present

## 2019-08-09 DIAGNOSIS — I2510 Atherosclerotic heart disease of native coronary artery without angina pectoris: Secondary | ICD-10-CM | POA: Diagnosis not present

## 2019-08-09 DIAGNOSIS — Z794 Long term (current) use of insulin: Secondary | ICD-10-CM | POA: Diagnosis not present

## 2019-08-09 DIAGNOSIS — I5022 Chronic systolic (congestive) heart failure: Secondary | ICD-10-CM

## 2019-08-09 DIAGNOSIS — Z8249 Family history of ischemic heart disease and other diseases of the circulatory system: Secondary | ICD-10-CM | POA: Diagnosis not present

## 2019-08-09 DIAGNOSIS — I428 Other cardiomyopathies: Secondary | ICD-10-CM | POA: Insufficient documentation

## 2019-08-09 DIAGNOSIS — K5790 Diverticulosis of intestine, part unspecified, without perforation or abscess without bleeding: Secondary | ICD-10-CM | POA: Diagnosis not present

## 2019-08-09 DIAGNOSIS — Z87891 Personal history of nicotine dependence: Secondary | ICD-10-CM | POA: Diagnosis not present

## 2019-08-09 DIAGNOSIS — Z7901 Long term (current) use of anticoagulants: Secondary | ICD-10-CM | POA: Diagnosis not present

## 2019-08-09 DIAGNOSIS — I110 Hypertensive heart disease with heart failure: Secondary | ICD-10-CM | POA: Diagnosis not present

## 2019-08-09 LAB — BASIC METABOLIC PANEL
Anion gap: 8 (ref 5–15)
BUN: 21 mg/dL (ref 8–23)
CO2: 24 mmol/L (ref 22–32)
Calcium: 9.4 mg/dL (ref 8.9–10.3)
Chloride: 104 mmol/L (ref 98–111)
Creatinine, Ser: 1.4 mg/dL — ABNORMAL HIGH (ref 0.61–1.24)
GFR calc Af Amer: 58 mL/min — ABNORMAL LOW (ref >60)
GFR calc non Af Amer: 50 mL/min — ABNORMAL LOW (ref >60)
Glucose, Bld: 126 mg/dL — ABNORMAL HIGH (ref 70–99)
Potassium: 4.9 mmol/L (ref 3.5–5.1)
Sodium: 136 mmol/L (ref 135–145)

## 2019-08-09 LAB — BRAIN NATRIURETIC PEPTIDE: B Natriuretic Peptide: 288.5 pg/mL — ABNORMAL HIGH (ref 0.0–100.0)

## 2019-08-09 NOTE — Patient Instructions (Signed)
Labs done today, your results will be available in MyChart, we will contact you for abnormal readings.  Your physician recommends that you schedule a follow-up appointment in: 2 weeks  If you have any questions or concerns before your next appointment please send [place] a message through [place] or call our office at [phone].  At the [facility], you and your health needs are our priority. As part of our continuing mission to provide you with exceptional heart care, we have created designated Provider Care Teams. These Care Teams include your primary Cardiologist (physician) and Advanced Practice Providers (APPs- Physician Assistants and Nurse Practitioners) who all work together to provide you with the care you need, when you need it.   You may see any of the following providers on your designated Care Team at your next follow up: [name] Dr Glori Bickers . Dr Loralie Champagne . Darrick Grinder, NP . Lyda Jester, PA . Audry Riles, PharmD   Please be sure to bring in all your medications bottles to every appointment.

## 2019-08-09 NOTE — Progress Notes (Signed)
ADVANCED HF CLINIC NOTE  Primary Care Provider: Redmond School, MD Primary Cardiologist: Sanda Klein, MD  HPI:  [name] is a89 y.o.malewith CAD s/p CABG in 2001 (LIMA-LAD, SVG to ramus, circumflex, and PDA), HTN, Chronic systolic HFdue to iCM, S/p st. Jude single chamber ICD, AAA s/p EVAR by Dr. Trula Slade, DM and HLD.  He had ananterior MI 2001 treated with coronary bypass grafting by Dr. Ladona Mow, His EF at that time was 35%. Hesubsequentlyunderwent ICD implantation for primary prevention 10/10.  Echo in 2013 EF 30-35%. Has not seen Dr. Loletha Grayer since 12/19.   Seen in ED with HF symptoms on 06/19/19. Treated with IV lasix. ECG at that time sinus with PVCs.  Admitted on 07/12/19 with acute/chronic systolic in setting of AFL with RVR. Started on amio and IV lasix but developed hypotension and AKI. PICC placed and co-ox 37%. CVP 15. Started on milrinone 0.25. Chemically converted to NSR with amio. Diuresed with IV lasix. Gradually milrinone was weaned off but CO-OX dropped so milrinone was restarted. Tunneled PICC placed for home milrinone.   On the day of discharge (07/29/19) he had LHC that showed patents grafts with no interventional target. HF medications adjusted for d/c. Referred to [facility] for home milrinone. Plan to follow closely in the HF clinic. Will try and wean milrinone off in a few weeks.  Readmitted on 08/03/19 with severe hypoglycemia. Glipizide stopped.   Returns today with his wife. Says he feels great. Remains on milrinone 0.125. Denies CP, orthopnea or PND. No CP. Blood sugars improved. No problem with milrinone pump/PICC.     ROS: All systems negative except as listed in HPI, PMH and Problem List.  SH:  Social History   Socioeconomic History  . Marital status: Widowed    Spouse name: Not on file  . Number of children: Not on file  . Years of education: Not on file  . Highest education level: Not on file  Occupational History  . Not on file   Tobacco Use  . Smoking status: Former Smoker    Years: 40.00  . Smokeless tobacco: Never Used  . Tobacco comment: quit smoking in 2001  Substance and Sexual Activity  . Alcohol use: No  . Drug use: No  . Sexual activity: Yes  Other Topics Concern  . Not on file  Social History Narrative  . Not on file   Social Determinants of Health   Financial Resource Strain:   . Difficulty of Paying Living Expenses:   Food Insecurity:   . Worried About Charity fundraiser in the Last Year:   . Arboriculturist in the Last Year:   Transportation Needs:   . Film/video editor (Medical):   [name] Lack of Transportation (Non-Medical):   Physical Activity:   . Days of Exercise per Week:   . Minutes of Exercise per Session:   Stress:   . Feeling of Stress :   Social Connections:   . Frequency of Communication with Friends and Family:   . Frequency of Social Gatherings with Friends and Family:   . Attends Religious Services:   . Active Member of Clubs or Organizations:   . Attends Archivist Meetings:   [name] Marital Status:   Intimate Partner Violence:   . Fear of Current or Ex-Partner:   . Emotionally Abused:   [name] Physically Abused:   . Sexually Abused:     FH:  Family History  Problem Relation Age of Onset  .  COPD Mother   . Heart disease Brother     Past Medical History:  Diagnosis Date  . Abdominal aortic aneurysm ([place])   . AICD (automatic cardioverter/defibrillator) present    St Jude  . Anemia    iron deficiency anemia,takes iron pill daily  . Arthritis   . Coronary artery disease    AWMI 2001  . Diverticulosis   . GERD (gastroesophageal reflux disease)    takes Protonix daily  . History of colon polyps    benign  . History of migraine 40 yrs ago  . History of shingles   . Hyperlipidemia    takes Atorvastatin and Niacin daily  . Hypertension    takes Diovan,Spironolactone, and Carvedilol daily  . ICD (implantable cardioverter-defibrillator), single, in  situ   . Ischemic cardiomyopathy   . Joint swelling   . LV dysfunction    takes Digoxin daily  . Myocardial infarction ([place]) 2001  . Pneumonia 40 yrs ago   hx of  . Shortness of breath dyspnea    with exertion.States he was a smoker for yr  . Status post coronary artery bypass grafting   . Type 2 diabetes mellitus (HCC)    takes Glipizide,Metformin,Invokana,and Lantus daily.Average fasting blood sugar runs about 140    Current Outpatient Medications  Medication Sig Dispense Refill  . albuterol (VENTOLIN HFA) 108 (90 Base) MCG/ACT inhaler Inhale 1-2 puffs into the lungs every 4 (four) hours as needed for wheezing or shortness of breath.     [name] amiodarone (PACERONE) 200 MG tablet Take 200 mg by mouth 2 (two) times daily.    [name] apixaban (ELIQUIS) 5 MG TABS tablet Take 1 tablet (5 mg total) by mouth 2 (two) times daily. 60 tablet 6  . atorvastatin (LIPITOR) 80 MG tablet Take 80 mg by mouth daily.    . digoxin (LANOXIN) 0.125 MG tablet Take 0.0625 mg by mouth daily.    . ferrous sulfate 325 (65 FE) MG tablet Take 325 mg by mouth daily with breakfast.    . FORA LANCETS MISC Test bid as directed. E11.65. (Patient taking differently: 1 each by Other route in the morning and at bedtime. ) 100 each 5  . glucose blood (FORA V10 BLOOD GLUCOSE TEST) test strip Use as instructed bid. E11.65 (Patient taking differently: 1 each by Other route in the morning and at bedtime. ) 100 each 5  . glucose blood test strip Use as instructed (Patient taking differently: 1 each by Other route See admin instructions. Use as instructed) 100 each 2  . insulin glargine (LANTUS SOLOSTAR) 100 UNIT/ML Solostar Pen Inject 25 Units into the skin at bedtime. 7.5 mL 0  . LANCETS ULTRA THIN MISC Test BG every morning (Patient taking differently: 1 each by Other route in the morning. ) 100 each 5  . LITETOUCH PEN NEEDLES 31G X 8 MM MISC USE AT BEDTIME AS DIRECTED. (Patient taking differently: 1 each by Other route at bedtime. ) 50  each 5  . losartan (COZAAR) 25 MG tablet Take 0.5 tablets (12.5 mg total) by mouth 2 (two) times daily. 30 tablet 6  . metFORMIN (GLUCOPHAGE) 500 MG tablet Take 1,000 mg by mouth 2 (two) times daily with a meal.     . milrinone (PRIMACOR) 20 MG/100 ML SOLN infusion Inject 0.0094 mg/min into the vein continuous. 100 mL 0  . Milrinone Lactate 50 MG/50ML SOLN 0.125 mcg/kg/min by Intravenous (Continuous Infusion) route continuous. Set pump at 0.38ml/hr  Wt=75kg    .  Multiple Vitamin (MULTIVITAMIN WITH MINERALS) TABS tablet Take 1 tablet by mouth daily.    [name] omeprazole (PRILOSEC OTC) 20 MG tablet Take 20 mg by mouth daily.    [name] oxymetazoline (AFRIN) 0.05 % nasal spray Place 2 sprays into both nostrils 2 (two) times daily as needed for congestion.    . sennosides-docusate sodium (SENOKOT-S) 8.6-50 MG tablet Take 1 tablet by mouth daily.     No current facility-administered medications for this encounter.    Vitals:   08/09/19 1541  BP: (!) 94/56  Pulse: 76  SpO2: 98%  Weight: 78.2 kg (172 lb 6.4 oz)    PHYSICAL EXAM:  General:  Well appearing. No resp difficulty HEENT: normal Neck: supple. JVP flat. Carotids 2+ bilaterally; no bruits. No lymphadenopathy or thryomegaly appreciated. PICC line in place Cor: PMI normal. Regular rate & rhythm. No rubs, gallops or murmurs. Lungs: clear Abdomen: soft, nontender, nondistended. No hepatosplenomegaly. No bruits or masses. Good bowel sounds. Extremities: no cyanosis, clubbing, rash, edema Neuro: alert & orientedx3, cranial nerves grossly intact. Moves all 4 extremities w/o difficulty. Affect pleasant.   ASSESSMENT & PLAN:  1. Acute on chronic systolic HF -> cardiogenic shock - due to mixed iCM/NICM. Has ICD - baseline EF 30-35% due to iCM. EF 4/21 EF10-15% due to tachy-induced CM (atrial fibrillation with RVR).  - Initial co-ox 37% -> milrinone started 3/28 and titrated up to 0.375. - Off milrinone on 07/24/19 but CO-OX back down to milrinone  0.125 mcg restarted. CO-OX improved on milrinone.  - Continue digoxin 0.125 mg daily.  -Continue losartan 12.5 mg bid. BP too soft to switch to [employer] - Off spironolactone with hyperkalemia - no b-blocker with low output - LBBB, will likely be eventual CRT candidate. He has ICD.  - Will continue milrinone for now. Will try to wean in 2-3 weeks. If fails wean, consider CRT upgrade to help wean.  - Discussed possibility of need for advanced therapies.  - Labs today  2. Afib/flutter, Persistent - with RVR likely the source of worsening cardiomyopathy. - Maintaining NSR now on amiodarone, continue.  - Continue Eliquis. No bleeding  3. AKI - baseline creatinine ~1.2. Was 1.4 on d/c - recheck today  4. CAD s/p CABG - s/p anterior MI in 2001 followed by CABG - LHC 4/21 with patent grafts with no interventional targets.  - continue medical therapy   Glori Bickers, MD  4:07 PM

## 2019-08-12 DIAGNOSIS — I509 Heart failure, unspecified: Secondary | ICD-10-CM | POA: Diagnosis not present

## 2019-08-12 DIAGNOSIS — R570 Cardiogenic shock: Secondary | ICD-10-CM | POA: Diagnosis not present

## 2019-08-12 DIAGNOSIS — I5022 Chronic systolic (congestive) heart failure: Secondary | ICD-10-CM | POA: Diagnosis not present

## 2019-08-14 ENCOUNTER — Telehealth (HOSPITAL_COMMUNITY): Payer: Self-pay

## 2019-08-14 NOTE — Telephone Encounter (Signed)
Called patient to discuss lab results. Advised k is elevated.  Per [facility] patient is taking KCL.  Pt states he does not take potassium. He asked if I could call his daughter to further discuss. I called his daughter, she confirmed he is not taking potassium. Advised her that he needs to follow a low potassium diet and repeat blood work. Message sent to [facility] to repeat blood work. Pt states they come on Mondays.

## 2019-08-14 NOTE — Telephone Encounter (Signed)
k 5.4

## 2019-08-15 NOTE — Telephone Encounter (Addendum)
Confirmed pt will have bmet drawn on Monday 08/19/19

## 2019-08-16 ENCOUNTER — Other Ambulatory Visit (HOSPITAL_COMMUNITY): Payer: Self-pay | Admitting: *Deleted

## 2019-08-16 MED ORDER — DIGOXIN 125 MCG PO TABS: 0.0625 mg | ORAL_TABLET | Freq: Every day | ORAL | 3 refills | Status: DC

## 2019-08-16 MED ORDER — AMIODARONE HCL 200 MG PO TABS: 200.0000 mg | ORAL_TABLET | Freq: Two times a day (BID) | ORAL | 3 refills | Status: DC

## 2019-08-19 DIAGNOSIS — R570 Cardiogenic shock: Secondary | ICD-10-CM | POA: Diagnosis not present

## 2019-08-19 DIAGNOSIS — I509 Heart failure, unspecified: Secondary | ICD-10-CM | POA: Diagnosis not present

## 2019-08-19 DIAGNOSIS — I5022 Chronic systolic (congestive) heart failure: Secondary | ICD-10-CM | POA: Diagnosis not present

## 2019-08-22 ENCOUNTER — Encounter (HOSPITAL_COMMUNITY): Payer: Medicare Other | Admitting: Internal Medicine

## 2019-08-22 ENCOUNTER — Other Ambulatory Visit (HOSPITAL_COMMUNITY): Payer: Self-pay | Admitting: Internal Medicine

## 2019-08-23 ENCOUNTER — Encounter (HOSPITAL_COMMUNITY): Payer: Self-pay | Admitting: Internal Medicine

## 2019-08-23 ENCOUNTER — Ambulatory Visit (HOSPITAL_COMMUNITY)
Admission: RE | Admit: 2019-08-23 | Discharge: 2019-08-23 | Disposition: A | Discharge status: Home / Self Care | Payer: Medicare Other | Source: Ambulatory Visit | Attending: Internal Medicine | Admitting: Internal Medicine

## 2019-08-23 ENCOUNTER — Other Ambulatory Visit (HOSPITAL_COMMUNITY): Payer: Self-pay | Admitting: Internal Medicine

## 2019-08-23 ENCOUNTER — Other Ambulatory Visit: Payer: Self-pay

## 2019-08-23 VITALS — BP 110/66 | HR 75 | Wt 177.6 lb

## 2019-08-23 DIAGNOSIS — Z87891 Personal history of nicotine dependence: Secondary | ICD-10-CM | POA: Insufficient documentation

## 2019-08-23 DIAGNOSIS — I2510 Atherosclerotic heart disease of native coronary artery without angina pectoris: Secondary | ICD-10-CM | POA: Insufficient documentation

## 2019-08-23 DIAGNOSIS — E785 Hyperlipidemia, unspecified: Secondary | ICD-10-CM | POA: Diagnosis not present

## 2019-08-23 DIAGNOSIS — I480 Paroxysmal atrial fibrillation: Secondary | ICD-10-CM

## 2019-08-23 DIAGNOSIS — Z8249 Family history of ischemic heart disease and other diseases of the circulatory system: Secondary | ICD-10-CM | POA: Insufficient documentation

## 2019-08-23 DIAGNOSIS — K219 Gastro-esophageal reflux disease without esophagitis: Secondary | ICD-10-CM | POA: Diagnosis not present

## 2019-08-23 DIAGNOSIS — I4819 Other persistent atrial fibrillation: Secondary | ICD-10-CM | POA: Insufficient documentation

## 2019-08-23 DIAGNOSIS — I5023 Acute on chronic systolic (congestive) heart failure: Secondary | ICD-10-CM | POA: Insufficient documentation

## 2019-08-23 DIAGNOSIS — D509 Iron deficiency anemia, unspecified: Secondary | ICD-10-CM | POA: Insufficient documentation

## 2019-08-23 DIAGNOSIS — I110 Hypertensive heart disease with heart failure: Secondary | ICD-10-CM | POA: Insufficient documentation

## 2019-08-23 DIAGNOSIS — Z825 Family history of asthma and other chronic lower respiratory diseases: Secondary | ICD-10-CM | POA: Diagnosis not present

## 2019-08-23 DIAGNOSIS — Z7901 Long term (current) use of anticoagulants: Secondary | ICD-10-CM | POA: Diagnosis not present

## 2019-08-23 DIAGNOSIS — I252 Old myocardial infarction: Secondary | ICD-10-CM | POA: Diagnosis not present

## 2019-08-23 DIAGNOSIS — Z79899 Other long term (current) drug therapy: Secondary | ICD-10-CM | POA: Diagnosis not present

## 2019-08-23 DIAGNOSIS — Z95810 Presence of automatic (implantable) cardiac defibrillator: Secondary | ICD-10-CM | POA: Insufficient documentation

## 2019-08-23 DIAGNOSIS — E119 Type 2 diabetes mellitus without complications: Secondary | ICD-10-CM | POA: Insufficient documentation

## 2019-08-23 DIAGNOSIS — N179 Acute kidney failure, unspecified: Secondary | ICD-10-CM | POA: Diagnosis not present

## 2019-08-23 DIAGNOSIS — I428 Other cardiomyopathies: Secondary | ICD-10-CM | POA: Diagnosis not present

## 2019-08-23 DIAGNOSIS — I4892 Unspecified atrial flutter: Secondary | ICD-10-CM | POA: Diagnosis not present

## 2019-08-23 DIAGNOSIS — I5022 Chronic systolic (congestive) heart failure: Secondary | ICD-10-CM | POA: Diagnosis not present

## 2019-08-23 DIAGNOSIS — M1990 Unspecified osteoarthritis, unspecified site: Secondary | ICD-10-CM | POA: Diagnosis not present

## 2019-08-23 DIAGNOSIS — Z794 Long term (current) use of insulin: Secondary | ICD-10-CM | POA: Diagnosis not present

## 2019-08-23 DIAGNOSIS — Z86010 Personal history of colonic polyps: Secondary | ICD-10-CM | POA: Diagnosis not present

## 2019-08-23 DIAGNOSIS — Z951 Presence of aortocoronary bypass graft: Secondary | ICD-10-CM | POA: Insufficient documentation

## 2019-08-23 LAB — BASIC METABOLIC PANEL
Anion gap: 12 (ref 5–15)
BUN: 13 mg/dL (ref 8–23)
CO2: 24 mmol/L (ref 22–32)
Calcium: 9.3 mg/dL (ref 8.9–10.3)
Chloride: 103 mmol/L (ref 98–111)
Creatinine, Ser: 1.33 mg/dL — ABNORMAL HIGH (ref 0.61–1.24)
GFR calc Af Amer: >60 mL/min (ref >60)
GFR calc non Af Amer: 53 mL/min — ABNORMAL LOW (ref >60)
Glucose, Bld: 110 mg/dL — ABNORMAL HIGH (ref 70–99)
Potassium: 4.7 mmol/L (ref 3.5–5.1)
Sodium: 139 mmol/L (ref 135–145)

## 2019-08-23 LAB — BRAIN NATRIURETIC PEPTIDE: B Natriuretic Peptide: 1083.8 pg/mL — ABNORMAL HIGH (ref 0.0–100.0)

## 2019-08-23 MED ORDER — LOSARTAN POTASSIUM 25 MG PO TABS: 25.0000 mg | ORAL_TABLET | Freq: Two times a day (BID) | ORAL | 6 refills | Status: DC

## 2019-08-23 NOTE — Progress Notes (Signed)
ADVANCED HF CLINIC NOTE  Primary Care Provider: Redmond School, MD Primary Cardiologist: Sanda Klein, MD  HPI:  [name] is a36 y.o.malewith CAD s/p CABG in 2001 (LIMA-LAD, SVG to ramus, circumflex, and PDA), HTN, Chronic systolic HFdue to iCM, S/p st. Jude single chamber ICD, AAA s/p EVAR by Dr. Trula Slade, DM and HLD.  He had ananterior MI 2001 treated with coronary bypass grafting by Dr. Ladona Mow, His EF at that time was 35%. Hesubsequentlyunderwent ICD implantation for primary prevention 10/10.  Echo in 2013 EF 30-35%. Has not seen Dr. Loletha Grayer since 12/19.   Seen in ED with HF symptoms on 06/19/19. Treated with IV lasix. ECG at that time sinus with PVCs.  Admitted on 07/12/19 with acute/chronic systolic in setting of AFL with RVR. Started on amio and IV lasix but developed hypotension and AKI. PICC placed and co-ox 37%. CVP 15. Started on milrinone 0.25. Chemically converted to NSR with amio. Diuresed with IV lasix. Gradually milrinone was weaned off but CO-OX dropped so milrinone was restarted. Tunneled PICC placed for home milrinone.   On the day of discharge (07/29/19) he had LHC that showed patents grafts with no interventional target. HF medications adjusted for d/c. Referred to [facility] for home milrinone. Plan to follow closely in the HF clinic. Will try and wean milrinone off in a few weeks.  Readmitted on 08/03/19 with severe hypoglycemia. Glipizide stopped.   Returns today with his wife. Says he feels great. Can do anything he wants. No walking 10 mins around the yard without problem. No problems with milrinone. No SOB, orthopnea or PND. Taking BPs 2x/day SBP 110-140   ROS: All systems negative except as listed in HPI, PMH and Problem List.  SH:  Social History   Socioeconomic History  . Marital status: Widowed    Spouse name: Not on file  . Number of children: Not on file  . Years of education: Not on file  . Highest education level: Not on file   Occupational History  . Not on file  Tobacco Use  . Smoking status: Former Smoker    Years: 40.00  . Smokeless tobacco: Never Used  . Tobacco comment: quit smoking in 2001  Substance and Sexual Activity  . Alcohol use: No  . Drug use: No  . Sexual activity: Yes  Other Topics Concern  . Not on file  Social History Narrative  . Not on file   Social Determinants of Health   Financial Resource Strain:   . Difficulty of Paying Living Expenses:   Food Insecurity:   . Worried About Charity fundraiser in the Last Year:   . Arboriculturist in the Last Year:   Transportation Needs:   . Film/video editor (Medical):   [name] Lack of Transportation (Non-Medical):   Physical Activity:   . Days of Exercise per Week:   . Minutes of Exercise per Session:   Stress:   . Feeling of Stress :   Social Connections:   . Frequency of Communication with Friends and Family:   . Frequency of Social Gatherings with Friends and Family:   . Attends Religious Services:   . Active Member of Clubs or Organizations:   . Attends Archivist Meetings:   [name] Marital Status:   Intimate Partner Violence:   . Fear of Current or Ex-Partner:   . Emotionally Abused:   [name] Physically Abused:   . Sexually Abused:     FH:  Family History  Problem Relation Age of Onset  . COPD Mother   . Heart disease Brother     Past Medical History:  Diagnosis Date  . Abdominal aortic aneurysm ([place])   . AICD (automatic cardioverter/defibrillator) present    St Jude  . Anemia    iron deficiency anemia,takes iron pill daily  . Arthritis   . Coronary artery disease    AWMI 2001  . Diverticulosis   . GERD (gastroesophageal reflux disease)    takes Protonix daily  . History of colon polyps    benign  . History of migraine 40 yrs ago  . History of shingles   . Hyperlipidemia    takes Atorvastatin and Niacin daily  . Hypertension    takes Diovan,Spironolactone, and Carvedilol daily  . ICD (implantable  cardioverter-defibrillator), single, in situ   . Ischemic cardiomyopathy   . Joint swelling   . LV dysfunction    takes Digoxin daily  . Myocardial infarction ([place]) 2001  . Pneumonia 40 yrs ago   hx of  . Shortness of breath dyspnea    with exertion.States he was a smoker for yr  . Status post coronary artery bypass grafting   . Type 2 diabetes mellitus (HCC)    takes Glipizide,Metformin,Invokana,and Lantus daily.Average fasting blood sugar runs about 140    Current Outpatient Medications  Medication Sig Dispense Refill  . albuterol (VENTOLIN HFA) 108 (90 Base) MCG/ACT inhaler Inhale 1-2 puffs into the lungs every 4 (four) hours as needed for wheezing or shortness of breath.     [name] amiodarone (PACERONE) 200 MG tablet Take 1 tablet (200 mg total) by mouth 2 (two) times daily. 60 tablet 3  . apixaban (ELIQUIS) 5 MG TABS tablet Take 1 tablet (5 mg total) by mouth 2 (two) times daily. 60 tablet 6  . atorvastatin (LIPITOR) 80 MG tablet Take 80 mg by mouth daily.    . digoxin (LANOXIN) 0.125 MG tablet Take 0.5 tablets (0.0625 mg total) by mouth daily. 45 tablet 3  . ferrous sulfate 325 (65 FE) MG tablet Take 325 mg by mouth daily with breakfast.    . FORA LANCETS MISC Test bid as directed. E11.65. (Patient taking differently: 1 each by Other route in the morning and at bedtime. ) 100 each 5  . glucose blood (FORA V10 BLOOD GLUCOSE TEST) test strip Use as instructed bid. E11.65 (Patient taking differently: 1 each by Other route in the morning and at bedtime. ) 100 each 5  . glucose blood test strip Use as instructed (Patient taking differently: 1 each by Other route See admin instructions. Use as instructed) 100 each 2  . insulin glargine (LANTUS SOLOSTAR) 100 UNIT/ML Solostar Pen Inject 25 Units into the skin at bedtime. 7.5 mL 0  . LANCETS ULTRA THIN MISC Test BG every morning (Patient taking differently: 1 each by Other route in the morning. ) 100 each 5  . LITETOUCH PEN NEEDLES 31G X 8 MM  MISC USE AT BEDTIME AS DIRECTED. (Patient taking differently: 1 each by Other route at bedtime. ) 50 each 5  . losartan (COZAAR) 25 MG tablet Take 0.5 tablets (12.5 mg total) by mouth 2 (two) times daily. 30 tablet 6  . metFORMIN (GLUCOPHAGE) 500 MG tablet Take 1,000 mg by mouth 2 (two) times daily with a meal.     . milrinone (PRIMACOR) 20 MG/100 ML SOLN infusion Inject 0.0094 mg/min into the vein continuous. 100 mL 0  . Milrinone Lactate 50 MG/50ML SOLN 0.125 mcg/kg/min by  Intravenous (Continuous Infusion) route continuous. Set pump at 0.51ml/hr  Wt=75kg    . Multiple Vitamin (MULTIVITAMIN WITH MINERALS) TABS tablet Take 1 tablet by mouth daily.    [name] omeprazole (PRILOSEC OTC) 20 MG tablet Take 20 mg by mouth daily.    [name] oxymetazoline (AFRIN) 0.05 % nasal spray Place 2 sprays into both nostrils 2 (two) times daily as needed for congestion.    . sennosides-docusate sodium (SENOKOT-S) 8.6-50 MG tablet Take 1 tablet by mouth daily.     No current facility-administered medications for this encounter.    Vitals:   08/23/19 1523  BP: 110/66  Pulse: 75  SpO2: 98%  Weight: 80.6 kg (177 lb 9.6 oz)    PHYSICAL EXAM:  General:  Well appearing. No resp difficulty HEENT: normal Neck: supple. no JVD. Carotids 2+ bilat; no bruits. No lymphadenopathy or thryomegaly appreciated. Cor: PMI nondisplaced. Regular rate & rhythm. 2/6 SEM RSB  R chest PICC Lungs: clear Abdomen: soft, nontender, nondistended. No hepatosplenomegaly. No bruits or masses. Good bowel sounds. Extremities: no cyanosis, clubbing, rash, edema Neuro: alert & orientedx3, cranial nerves grossly intact. moves all 4 extremities w/o difficulty. Affect pleasant    ASSESSMENT & PLAN:  1. Acute on chronic systolic HF -> cardiogenic shock - due to mixed iCM/NICM. Has ICD - baseline EF 30-35% due to iCM. EF 4/21 EF10-15% due to tachy-induced CM (atrial fibrillation with RVR).  - Initial co-ox 37% -> milrinone started 3/28 and titrated  up to 0.375. - Off milrinone on 07/24/19 but CO-OX back down to milrinone 0.125 mcg restarted. - Doing very well. Will stop milrinone today.  - Continue digoxin 0.125 mg daily.  -Increase losartan to 25 mg bid. If tolerates con consider Entresto at next visit  - Off spironolactone with hyperkalemia - no b-blocker with low output - LBBB, will likely be eventual CRT candidate. He has ICD.  - If fails milrinone wean, consider CRT upgrade to help wean.  - Discussed possibility of need for advanced therapies.  - See back in 2-4 weeks with echo   2. Afib/flutter, Persistent - with RVR likely the source of worsening cardiomyopathy. - Maintaining NSR on ami - Continue Eliquis. No bleeding  3. AKI - baseline creatinine ~1.2. Was 1.4 on d/c - Creatinine 1.4 on last visit 08/09/19  4. CAD s/p CABG - s/p anterior MI in 2001 followed by CABG - LHC 4/21 with patent grafts with no interventional targets.  - continue medical therapy - no s/s angina   Glori Bickers, MD  3:30 PM

## 2019-08-23 NOTE — Patient Instructions (Signed)
Stop Milrinone, your nurse is aware   Increase Losartan to 25 mg Twice daily   Labs done today, your results will be available in MyChart, we will contact you for abnormal readings.  Your physician recommends that you schedule a follow-up appointment in: 2-3 weeks with echocardiogram  If you have any questions or concerns before your next appointment please send [place] a message through [place] or call our office at [phone].  At the [facility], you and your health needs are our priority. As part of our continuing mission to provide you with exceptional heart care, we have created designated Provider Care Teams. These Care Teams include your primary Cardiologist (physician) and Advanced Practice Providers (APPs- Physician Assistants and Nurse Practitioners) who all work together to provide you with the care you need, when you need it.   You may see any of the following providers on your designated Care Team at your next follow up: [name] Dr Glori Bickers . Dr Loralie Champagne . Darrick Grinder, NP . Lyda Jester, PA . Audry Riles, PharmD   Please be sure to bring in all your medications bottles to every appointment.

## 2019-08-23 NOTE — Progress Notes (Signed)
Called pt's McD[place]ie, [phone] and gave VO to stop Milrinone, leave PICC in place for now. She is aware, agreeable and verbalized understanding.

## 2019-08-23 NOTE — Addendum Note (Signed)
Encounter addended by: Scarlette Calico, RN on: 08/23/2019 3:48 PM  Actions taken: Pharmacy for encounter modified, Order list changed, Diagnosis association updated, Clinical Note Signed, Charge Capture section accepted

## 2019-08-23 NOTE — Addendum Note (Signed)
Encounter addended by: Scarlette Calico, RN on: 08/23/2019 3:53 PM  Actions taken: Clinical Note Signed

## 2019-08-26 DIAGNOSIS — I5022 Chronic systolic (congestive) heart failure: Secondary | ICD-10-CM | POA: Diagnosis not present

## 2019-08-26 DIAGNOSIS — I509 Heart failure, unspecified: Secondary | ICD-10-CM | POA: Diagnosis not present

## 2019-08-26 DIAGNOSIS — R570 Cardiogenic shock: Secondary | ICD-10-CM | POA: Diagnosis not present

## 2019-08-29 ENCOUNTER — Other Ambulatory Visit (HOSPITAL_COMMUNITY): Payer: Self-pay | Admitting: *Deleted

## 2019-08-29 ENCOUNTER — Encounter (HOSPITAL_COMMUNITY): Payer: Self-pay

## 2019-08-29 MED ORDER — LOSARTAN POTASSIUM 25 MG PO TABS: 25.0000 mg | ORAL_TABLET | Freq: Two times a day (BID) | ORAL | 6 refills | Status: DC

## 2019-08-29 MED ORDER — APIXABAN 5 MG PO TABS: 5.0000 mg | ORAL_TABLET | Freq: Two times a day (BID) | ORAL | 6 refills | Status: AC

## 2019-09-03 ENCOUNTER — Other Ambulatory Visit (HOSPITAL_COMMUNITY): Payer: Self-pay | Admitting: Internal Medicine

## 2019-09-03 ENCOUNTER — Telehealth (HOSPITAL_COMMUNITY): Payer: Self-pay | Admitting: Cardiology

## 2019-09-03 MED ORDER — MAGNESIUM OXIDE -MG SUPPLEMENT 200 MG PO TABS: 400.0000 mg | ORAL_TABLET | Freq: Every day | ORAL | 11 refills | Status: DC

## 2019-09-03 NOTE — Telephone Encounter (Signed)
Abnormal labs received 08/26/19 MG 1.3 K 4.5 Cr 1.08 BUN 15  Per [name],PA Start MagOx 400mg  daily, repeat labs in 1 week  Pt aware and voiced understanding [facility] aware and will repeat labs at next home visit 5/24

## 2019-09-09 DIAGNOSIS — I509 Heart failure, unspecified: Secondary | ICD-10-CM | POA: Diagnosis not present

## 2019-09-09 DIAGNOSIS — Z0000 Encounter for general adult medical examination without abnormal findings: Secondary | ICD-10-CM | POA: Diagnosis not present

## 2019-09-10 ENCOUNTER — Ambulatory Visit (HOSPITAL_BASED_OUTPATIENT_CLINIC_OR_DEPARTMENT_OTHER)
Admission: RE | Admit: 2019-09-10 | Discharge: 2019-09-10 | Disposition: A | Discharge status: Home / Self Care | Payer: Medicare Other | Source: Ambulatory Visit | Attending: Internal Medicine | Admitting: Internal Medicine

## 2019-09-10 ENCOUNTER — Ambulatory Visit (HOSPITAL_COMMUNITY)
Admission: RE | Admit: 2019-09-10 | Discharge: 2019-09-10 | Disposition: A | Discharge status: Home / Self Care | Payer: Medicare Other | Source: Ambulatory Visit | Attending: Internal Medicine | Admitting: Internal Medicine

## 2019-09-10 ENCOUNTER — Other Ambulatory Visit (HOSPITAL_COMMUNITY): Payer: Self-pay | Admitting: Internal Medicine

## 2019-09-10 ENCOUNTER — Other Ambulatory Visit: Payer: Self-pay

## 2019-09-10 ENCOUNTER — Encounter (HOSPITAL_COMMUNITY): Payer: Self-pay | Admitting: Internal Medicine

## 2019-09-10 VITALS — BP 130/78 | HR 70 | Wt 175.0 lb

## 2019-09-10 DIAGNOSIS — I5022 Chronic systolic (congestive) heart failure: Secondary | ICD-10-CM

## 2019-09-10 DIAGNOSIS — Z86010 Personal history of colonic polyps: Secondary | ICD-10-CM | POA: Insufficient documentation

## 2019-09-10 DIAGNOSIS — Z95810 Presence of automatic (implantable) cardiac defibrillator: Secondary | ICD-10-CM | POA: Diagnosis not present

## 2019-09-10 DIAGNOSIS — Z825 Family history of asthma and other chronic lower respiratory diseases: Secondary | ICD-10-CM | POA: Diagnosis not present

## 2019-09-10 DIAGNOSIS — Z4901 Encounter for fitting and adjustment of extracorporeal dialysis catheter: Secondary | ICD-10-CM | POA: Insufficient documentation

## 2019-09-10 DIAGNOSIS — I4892 Unspecified atrial flutter: Secondary | ICD-10-CM | POA: Insufficient documentation

## 2019-09-10 DIAGNOSIS — I4819 Other persistent atrial fibrillation: Secondary | ICD-10-CM | POA: Insufficient documentation

## 2019-09-10 DIAGNOSIS — I252 Old myocardial infarction: Secondary | ICD-10-CM | POA: Diagnosis not present

## 2019-09-10 DIAGNOSIS — Z951 Presence of aortocoronary bypass graft: Secondary | ICD-10-CM | POA: Diagnosis not present

## 2019-09-10 DIAGNOSIS — I428 Other cardiomyopathies: Secondary | ICD-10-CM | POA: Diagnosis not present

## 2019-09-10 DIAGNOSIS — K219 Gastro-esophageal reflux disease without esophagitis: Secondary | ICD-10-CM | POA: Insufficient documentation

## 2019-09-10 DIAGNOSIS — E119 Type 2 diabetes mellitus without complications: Secondary | ICD-10-CM | POA: Insufficient documentation

## 2019-09-10 DIAGNOSIS — I110 Hypertensive heart disease with heart failure: Secondary | ICD-10-CM | POA: Insufficient documentation

## 2019-09-10 DIAGNOSIS — N179 Acute kidney failure, unspecified: Secondary | ICD-10-CM | POA: Insufficient documentation

## 2019-09-10 DIAGNOSIS — Z87891 Personal history of nicotine dependence: Secondary | ICD-10-CM | POA: Insufficient documentation

## 2019-09-10 DIAGNOSIS — Z79899 Other long term (current) drug therapy: Secondary | ICD-10-CM | POA: Diagnosis not present

## 2019-09-10 DIAGNOSIS — Z8249 Family history of ischemic heart disease and other diseases of the circulatory system: Secondary | ICD-10-CM | POA: Insufficient documentation

## 2019-09-10 DIAGNOSIS — I2510 Atherosclerotic heart disease of native coronary artery without angina pectoris: Secondary | ICD-10-CM | POA: Insufficient documentation

## 2019-09-10 DIAGNOSIS — I447 Left bundle-branch block, unspecified: Secondary | ICD-10-CM | POA: Diagnosis not present

## 2019-09-10 DIAGNOSIS — I5023 Acute on chronic systolic (congestive) heart failure: Secondary | ICD-10-CM | POA: Insufficient documentation

## 2019-09-10 DIAGNOSIS — E785 Hyperlipidemia, unspecified: Secondary | ICD-10-CM | POA: Insufficient documentation

## 2019-09-10 DIAGNOSIS — I480 Paroxysmal atrial fibrillation: Secondary | ICD-10-CM | POA: Diagnosis not present

## 2019-09-10 DIAGNOSIS — I4891 Unspecified atrial fibrillation: Secondary | ICD-10-CM | POA: Diagnosis not present

## 2019-09-10 DIAGNOSIS — Z7901 Long term (current) use of anticoagulants: Secondary | ICD-10-CM | POA: Insufficient documentation

## 2019-09-10 DIAGNOSIS — Z794 Long term (current) use of insulin: Secondary | ICD-10-CM | POA: Diagnosis not present

## 2019-09-10 DIAGNOSIS — Z452 Encounter for adjustment and management of vascular access device: Secondary | ICD-10-CM | POA: Diagnosis not present

## 2019-09-10 DIAGNOSIS — M1990 Unspecified osteoarthritis, unspecified site: Secondary | ICD-10-CM | POA: Insufficient documentation

## 2019-09-10 HISTORY — PX: IR REMOVAL TUN CV CATH W/O FL: IMG2289

## 2019-09-10 LAB — BASIC METABOLIC PANEL
Anion gap: 10 (ref 5–15)
BUN: 14 mg/dL (ref 8–23)
CO2: 24 mmol/L (ref 22–32)
Calcium: 9.2 mg/dL (ref 8.9–10.3)
Chloride: 106 mmol/L (ref 98–111)
Creatinine, Ser: 1.34 mg/dL — ABNORMAL HIGH (ref 0.61–1.24)
GFR calc Af Amer: >60 mL/min (ref >60)
GFR calc non Af Amer: 53 mL/min — ABNORMAL LOW (ref >60)
Glucose, Bld: 167 mg/dL — ABNORMAL HIGH (ref 70–99)
Potassium: 4.5 mmol/L (ref 3.5–5.1)
Sodium: 140 mmol/L (ref 135–145)

## 2019-09-10 LAB — CBC
HCT: 35 % — ABNORMAL LOW (ref 39.0–52.0)
Hemoglobin: 11.1 g/dL — ABNORMAL LOW (ref 13.0–17.0)
MCH: 29.7 pg (ref 26.0–34.0)
MCHC: 31.7 g/dL (ref 30.0–36.0)
MCV: 93.6 fL (ref 80.0–100.0)
Platelets: 242 10*3/uL (ref 150–400)
RBC: 3.74 MIL/uL — ABNORMAL LOW (ref 4.22–5.81)
RDW: 14.6 % (ref 11.5–15.5)
WBC: 7.2 10*3/uL (ref 4.0–10.5)
nRBC: 0 % (ref 0.0–0.2)

## 2019-09-10 LAB — BRAIN NATRIURETIC PEPTIDE: B Natriuretic Peptide: 1542 pg/mL — ABNORMAL HIGH (ref 0.0–100.0)

## 2019-09-10 MED ORDER — ENTRESTO 49-51 MG PO TABS: 1.0000 | ORAL_TABLET | Freq: Two times a day (BID) | ORAL | 3 refills | Status: DC

## 2019-09-10 MED ORDER — CHLORHEXIDINE GLUCONATE 4 % EX LIQD
CUTANEOUS | Status: AC
  Filled 2019-09-10: qty 15

## 2019-09-10 MED ORDER — LIDOCAINE HCL 1 % IJ SOLN
INTRAMUSCULAR | Status: AC
  Filled 2019-09-10: qty 20

## 2019-09-10 MED ORDER — PERFLUTREN LIPID MICROSPHERE
1.0000 mL | INTRAVENOUS | Status: DC | PRN
  Administered 2019-09-10: 2 mL via INTRAVENOUS
  Filled 2019-09-10: qty 10

## 2019-09-10 NOTE — Procedures (Signed)
Pre procedural Dx: Poor venous access Post procedural Dx: Same  Successful removal of tunneled CVC.  EBL: None No immediate complications.  Ronny Bacon, MD Pager #: [phone]

## 2019-09-10 NOTE — Progress Notes (Signed)
ADVANCED HF CLINIC NOTE  Primary Care Provider: Redmond School, MD Primary Cardiologist: Sanda Klein, MD  HPI:  [name] is a57 y.o.malewith CAD s/p CABG in 2001 (LIMA-LAD, SVG to ramus, circumflex, and PDA), HTN, Chronic systolic HFdue to iCM, S/p st. Jude single chamber ICD, AAA s/p EVAR by Dr. Trula Slade, DM2 and HLD.  He had ananterior MI 2001 treated with coronary bypass grafting by Dr. Ladona Mow, His EF at that time was 35%. Hesubsequentlyunderwent ICD implantation for primary prevention 10/10.  Echo in 2013 EF 30-35%. Has not seen Dr. Loletha Grayer since 12/19.   Seen in ED with HF symptoms on 06/19/19. Treated with IV lasix. ECG at that time sinus with PVCs.  Admitted on 07/12/19 with acute/chronic systolic in setting of AFL with RVR.  EF10-15% due to tachy-induced CM .. Started on amio and IV lasix but developed hypotension and AKI. PICC placed and co-ox 37%. CVP 15. Started on milrinone 0.25. Chemically converted to NSR with amio. Diuresed with IV lasix. Gradually milrinone was weaned off but CO-OX dropped so milrinone was restarted. Tunneled PICC placed for home milrinone.   On the day of discharge (07/29/19) he had LHC that showed patents grafts with no interventional target. HF medications adjusted for d/c. Referred to [facility] for home milrinone. Plan to follow closely in the HF clinic. Will try and wean milrinone off in a few weeks.  Readmitted on 08/03/19 with severe hypoglycemia. Glipizide stopped.   Returns today for routine f/u. Milrinone stopped at last visit. Says he feels really good. Does not notice a difference. SBP running 120-130. Walking 1 mile per day. No CP or SOB/ No swelling.   Echo today 09/10/2019 EF 30-35% Personally reviewed   ROS: All systems negative except as listed in HPI, PMH and Problem List.  SH:  Social History   Socioeconomic History  . Marital status: Widowed    Spouse name: Not on file  . Number of children: Not on file  . Years of  education: Not on file  . Highest education level: Not on file  Occupational History  . Not on file  Tobacco Use  . Smoking status: Former Smoker    Years: 40.00  . Smokeless tobacco: Never Used  . Tobacco comment: quit smoking in 2001  Substance and Sexual Activity  . Alcohol use: No  . Drug use: No  . Sexual activity: Yes  Other Topics Concern  . Not on file  Social History Narrative  . Not on file   Social Determinants of Health   Financial Resource Strain:   . Difficulty of Paying Living Expenses:   Food Insecurity:   . Worried About Charity fundraiser in the Last Year:   . Arboriculturist in the Last Year:   Transportation Needs:   . Film/video editor (Medical):   [name] Lack of Transportation (Non-Medical):   Physical Activity:   . Days of Exercise per Week:   . Minutes of Exercise per Session:   Stress:   . Feeling of Stress :   Social Connections:   . Frequency of Communication with Friends and Family:   . Frequency of Social Gatherings with Friends and Family:   . Attends Religious Services:   . Active Member of Clubs or Organizations:   . Attends Archivist Meetings:   [name] Marital Status:   Intimate Partner Violence:   . Fear of Current or Ex-Partner:   . Emotionally Abused:   [name] Physically Abused:   .  Sexually Abused:     FH:  Family History  Problem Relation Age of Onset  . COPD Mother   . Heart disease Brother     Past Medical History:  Diagnosis Date  . Abdominal aortic aneurysm ([place])   . AICD (automatic cardioverter/defibrillator) present    St Jude  . Anemia    iron deficiency anemia,takes iron pill daily  . Arthritis   . Coronary artery disease    AWMI 2001  . Diverticulosis   . GERD (gastroesophageal reflux disease)    takes Protonix daily  . History of colon polyps    benign  . History of migraine 40 yrs ago  . History of shingles   . Hyperlipidemia    takes Atorvastatin and Niacin daily  . Hypertension    takes  Diovan,Spironolactone, and Carvedilol daily  . ICD (implantable cardioverter-defibrillator), single, in situ   . Ischemic cardiomyopathy   . Joint swelling   . LV dysfunction    takes Digoxin daily  . Myocardial infarction ([place]) 2001  . Pneumonia 40 yrs ago   hx of  . Shortness of breath dyspnea    with exertion.States he was a smoker for yr  . Status post coronary artery bypass grafting   . Type 2 diabetes mellitus (HCC)    takes Glipizide,Metformin,Invokana,and Lantus daily.Average fasting blood sugar runs about 140    Current Outpatient Medications  Medication Sig Dispense Refill  . albuterol (VENTOLIN HFA) 108 (90 Base) MCG/ACT inhaler Inhale 1-2 puffs into the lungs every 4 (four) hours as needed for wheezing or shortness of breath.     [name] amiodarone (PACERONE) 200 MG tablet Take 1 tablet (200 mg total) by mouth 2 (two) times daily. 60 tablet 3  . apixaban (ELIQUIS) 5 MG TABS tablet Take 1 tablet (5 mg total) by mouth 2 (two) times daily. 60 tablet 6  . atorvastatin (LIPITOR) 80 MG tablet Take 80 mg by mouth daily.    . digoxin (LANOXIN) 0.125 MG tablet Take 0.5 tablets (0.0625 mg total) by mouth daily. 45 tablet 3  . ferrous sulfate 325 (65 FE) MG tablet Take 325 mg by mouth daily with breakfast.    . FORA LANCETS MISC Test bid as directed. E11.65. (Patient taking differently: 1 each by Other route in the morning and at bedtime. ) 100 each 5  . glucose blood (FORA V10 BLOOD GLUCOSE TEST) test strip Use as instructed bid. E11.65 (Patient taking differently: 1 each by Other route in the morning and at bedtime. ) 100 each 5  . glucose blood test strip Use as instructed (Patient taking differently: 1 each by Other route See admin instructions. Use as instructed) 100 each 2  . insulin glargine (LANTUS SOLOSTAR) 100 UNIT/ML Solostar Pen Inject 25 Units into the skin at bedtime. 7.5 mL 0  . LANCETS ULTRA THIN MISC Test BG every morning (Patient taking differently: 1 each by Other route in  the morning. ) 100 each 5  . LITETOUCH PEN NEEDLES 31G X 8 MM MISC USE AT BEDTIME AS DIRECTED. (Patient taking differently: 1 each by Other route at bedtime. ) 50 each 5  . losartan (COZAAR) 25 MG tablet Take 1 tablet (25 mg total) by mouth 2 (two) times daily. 60 tablet 6  . metFORMIN (GLUCOPHAGE) 500 MG tablet Take 1,000 mg by mouth 2 (two) times daily with a meal.     . Multiple Vitamin (MULTIVITAMIN WITH MINERALS) TABS tablet Take 1 tablet by mouth daily.    [name]  omeprazole (PRILOSEC OTC) 20 MG tablet Take 20 mg by mouth daily.    [name] oxymetazoline (AFRIN) 0.05 % nasal spray Place 2 sprays into both nostrils 2 (two) times daily as needed for congestion.    . sennosides-docusate sodium (SENOKOT-S) 8.6-50 MG tablet Take 1 tablet by mouth daily.     No current facility-administered medications for this encounter.   Facility-Administered Medications Ordered in Other Encounters  Medication Dose Route Frequency Provider Last Rate Last Admin  . perflutren lipid microspheres (DEFINITY) IV suspension  1-10 mL Intravenous PRN Bensimhon, Shaune Pascal, MD   2 mL at 09/10/19 1435    Vitals:   09/10/19 1521  BP: 130/78  Pulse: 70  SpO2: 97%  Weight: 79.4 kg (175 lb)    PHYSICAL EXAM:  General:  Well appearing. No resp difficulty HEENT: normal Neck: supple. no JVD. Carotids 2+ bilat; no bruits. No lymphadenopathy or thryomegaly appreciated. Cor: PMI nondisplaced. Regular rate & rhythm. 2/6 SEM RSB  R chest PICC Lungs: clear Abdomen: soft, nontender, nondistended. No hepatosplenomegaly. No bruits or masses. Good bowel sounds. Extremities: no cyanosis, clubbing, rash, edema Neuro: alert & orientedx3, cranial nerves grossly intact. moves all 4 extremities w/o difficulty. Affect pleasant  NSR 69 LBBB Personally reviewed   ASSESSMENT & PLAN:  1. Acute on chronic systolic HF -> cardiogenic shock - due to mixed iCM/NICM. Has ICD - baseline EF 30-35% due to iCM. EF 4/21 EF10-15% due to tachy-induced CM  (atrial fibrillation with RVR).  - Initial co-ox 37% -> milrinone started 3/28 and titrated up to 0.375. - Milrinone stopped at last clinic visit.  - Echo today EF 30-35% (back to baseline with suppression of AF) - ICD interrogated in clinic. No VT/AF. No shocks. Volume ok Personally reviewed - Remains stable NYHA II - Volume status looks good.  - Continue digoxin 0.125 mg daily.  -Switch losartan to Entresto 49/51 bid  - Off spironolactone with hyperkalemia - no b-blocker with low output - LBBB, will likely be eventual CRT candidate. He has ICD. Refer to EP for AF ablation and possible CRT upgrade (d/w Dr. Recardo Evangelist) - I tried to remove tunneled PICC in clinic but was unable to do so. We contacted IR who removed for patient today.   2. Afib/flutter, Persistent - with RVR likely the source of worsening cardiomyopathy. - Maintaining NSR on amio - Remains on Eliqui No bleeding - Refer to EP for AF ablation and possible CRT upgrade (d/w Dr. Recardo Evangelist)  3. AKI - baseline creatinine ~1.2. Was 1.4 on d/c - Creatinine 1.3 on last visit 08/23/19 - repeat today  4. CAD s/p CABG - s/p anterior MI in 2001 followed by CABG - LHC 4/21 with patent grafts with no interventional targets.  - continue medical therapy - no s/s angina  Glori Bickers, MD  3:58 PM

## 2019-09-10 NOTE — Patient Instructions (Addendum)
Stop Losartan  Stop Digoxin  Start Entresto 49/51 mg Twice daily   Labs done today, your results will be available in MyChart, we will contact you for abnormal readings.  You have been referred to Dr Rayann Heman for ablation   Your physician recommends that you schedule a follow-up appointment in: 2 months  If you have any questions or concerns before your next appointment please send [place] a message through [place] or call our office at [phone].  At the [facility], you and your health needs are our priority. As part of our continuing mission to provide you with exceptional heart care, we have created designated Provider Care Teams. These Care Teams include your primary Cardiologist (physician) and Advanced Practice Providers (APPs- Physician Assistants and Nurse Practitioners) who all work together to provide you with the care you need, when you need it.   You may see any of the following providers on your designated Care Team at your next follow up: [name] Dr Glori Bickers . Dr Loralie Champagne . Darrick Grinder, NP . Lyda Jester, PA . Audry Riles, PharmD   Please be sure to bring in all your medications bottles to every appointment.    Cardiac Ablation  Cardiac ablation is a procedure to stop some heart tissue from causing problems. The heart has many electrical connections. Sometimes these connections make the heart beat very fast or irregularly. Removing some problem areas can improve the heart rhythm or make it normal. What happens before the procedure?  Follow instructions from your doctor about what you cannot eat or drink.  Ask your doctor about: ? Changing or stopping your normal medicines. This is important if you take diabetes medicines or blood thinners. ? Taking medicines such as aspirin and ibuprofen. These medicines can thin your blood. Do not take these medicines before your procedure if your doctor tells you not to.  Plan to have someone take you  home.  If you will be going home right after the procedure, plan to have someone with you for 24 hours. What happens during the procedure?  To lower your risk of infection: ? Your health care team will wash or sanitize their hands. ? Your skin will be washed with soap. ? Hair may be removed from your neck or groin.  An IV tube will be put into one of your veins.  You will be given a medicine to help you relax (sedative).  Skin on your neck or groin will be numbed.  A cut (incision) will be made in your neck or groin.  A needle will be put through your cut and into a vein in your neck or groin.  A tube (catheter) will be put into the needle. The tube will be moved to your heart. X-rays (fluoroscopy) will be used to help guide the tube.  Small devices (electrodes) on the tip of the tube will send out electrical currents.  Dye may be put through the tube. This helps your surgeon see your heart.  Electrical energy will be used to scar (ablate) some heart tissue. Your surgeon may use: ? Heat (radiofrequency energy). ? Laser energy. ? Extreme cold (cryoablation).  The tube will be taken out.  Pressure will be held on your cut. This helps stop bleeding.  A bandage (dressing) will be put on your cut. The procedure may vary. What happens after the procedure?  You will be monitored until your medicines have worn off.  Your cut will be watched for bleeding. You  will need to lie still for a few hours.  Do not drive for 24 hours or as long as your doctor tells you. Summary  Cardiac ablation is a procedure to stop some heart tissue from causing problems.  Electrical energy will be used to scar (ablate) some heart tissue. This information is not intended to replace advice given to you by your health care provider. Make sure you discuss any questions you have with your health care provider. Document Revised: 03/17/2017 Document Reviewed: 02/22/2016 Elsevier Patient Education  2020  [employer].

## 2019-09-10 NOTE — Progress Notes (Signed)
  Echocardiogram 2D Echocardiogram has been performed.  Jordan Ross 09/10/2019, 3:05 PM

## 2019-09-18 ENCOUNTER — Other Ambulatory Visit (HOSPITAL_COMMUNITY): Payer: Self-pay | Admitting: Internal Medicine

## 2019-09-23 ENCOUNTER — Telehealth: Payer: Self-pay

## 2019-09-24 NOTE — Telephone Encounter (Signed)
Spoke with pt regarding virtual appt on 09/25/19. Pt stated he did not have any questions at this time. Pt agreed and confirmed his virtual visit.

## 2019-09-25 ENCOUNTER — Other Ambulatory Visit: Payer: Self-pay

## 2019-09-25 ENCOUNTER — Ambulatory Visit (INDEPENDENT_AMBULATORY_CARE_PROVIDER_SITE_OTHER): Payer: Medicare Other | Admitting: *Deleted

## 2019-09-25 ENCOUNTER — Telehealth (INDEPENDENT_AMBULATORY_CARE_PROVIDER_SITE_OTHER): Payer: Medicare Other | Admitting: Internal Medicine

## 2019-09-25 ENCOUNTER — Encounter: Payer: Self-pay | Admitting: Internal Medicine

## 2019-09-25 VITALS — BP 131/61 | HR 68 | Ht 72.0 in | Wt 173.0 lb

## 2019-09-25 DIAGNOSIS — D6869 Other thrombophilia: Secondary | ICD-10-CM

## 2019-09-25 DIAGNOSIS — I447 Left bundle-branch block, unspecified: Secondary | ICD-10-CM

## 2019-09-25 DIAGNOSIS — I2510 Atherosclerotic heart disease of native coronary artery without angina pectoris: Secondary | ICD-10-CM | POA: Diagnosis not present

## 2019-09-25 DIAGNOSIS — I5022 Chronic systolic (congestive) heart failure: Secondary | ICD-10-CM

## 2019-09-25 DIAGNOSIS — I483 Typical atrial flutter: Secondary | ICD-10-CM | POA: Diagnosis not present

## 2019-09-25 DIAGNOSIS — I255 Ischemic cardiomyopathy: Secondary | ICD-10-CM | POA: Diagnosis not present

## 2019-09-25 LAB — CUP PACEART REMOTE DEVICE CHECK
Battery Remaining Longevity: 25 mo
Battery Remaining Percentage: 22 %
Battery Voltage: 2.78 V
Brady Statistic RV Percent Paced: 1 %
Date Time Interrogation Session: 20210609025505
HighPow Impedance: 35 Ohm
Implantable Lead Implant Date: 20101028
Implantable Lead Location: 753860
Implantable Pulse Generator Implant Date: 20101028
Lead Channel Impedance Value: 340 Ohm
Lead Channel Pacing Threshold Amplitude: 0.75 V
Lead Channel Pacing Threshold Pulse Width: 0.4 ms
Lead Channel Sensing Intrinsic Amplitude: 11.9 mV
Lead Channel Setting Pacing Amplitude: 2.5 V
Lead Channel Setting Pacing Pulse Width: 0.4 ms
Lead Channel Setting Sensing Sensitivity: 0.5 mV
Pulse Gen Serial Number: 737526

## 2019-09-25 NOTE — Progress Notes (Signed)
Electrophysiology TeleHealth Note  Due to national recommendations of social distancing due to Jordan Ross 19, an audio telehealth visit is felt to be most appropriate for this patient at this time.  Verbal consent was obtained by me for the telehealth visit today.  The patient does not have capability for a virtual visit.  A phone visit is therefore required today.   Date:  09/25/2019   ID:  [name], DOB 10-Dec-1945, MRN [id number]  Location: patient's home  Provider location:  [place]  Evaluation Performed: Follow-up visit  PCP:  Redmond School, MD   Electrophysiologist:  Dr Rayann Heman  Chief Complaint:  palpitations  History of Present Illness:    Jordan Ross is a 74 y.o. male who presents via telehealth conferencing today.  Since recent hospital discharge, the patient reports doing very well.   He is doing well with amiodarone.  Today, he denies symptoms of palpitations, chest pain, shortness of breath,  lower extremity edema, dizziness, presyncope, or syncope.  The patient is otherwise without complaint today.     Past Medical History:  Diagnosis Date  . Abdominal aortic aneurysm ([place])   . AICD (automatic cardioverter/defibrillator) present    [employer]  . Anemia    iron deficiency anemia,takes iron pill daily  . Arthritis   . Coronary artery disease    AWMI 2001  . Diverticulosis   . GERD (gastroesophageal reflux disease)    takes Protonix daily  . History of colon polyps    benign  . History of migraine 40 yrs ago  . History of shingles   . Hyperlipidemia    takes Atorvastatin and Niacin daily  . Hypertension    takes Diovan,Spironolactone, and Carvedilol daily  . ICD (implantable cardioverter-defibrillator), single, in situ   . Ischemic cardiomyopathy   . Joint swelling   . LV dysfunction    takes Digoxin daily  . Myocardial infarction ([place]) 2001  . Pneumonia 40 yrs ago   hx of  . Shortness of breath dyspnea    with exertion.States he was a  smoker for yr  . Status post coronary artery bypass grafting   . Type 2 diabetes mellitus ([facility])    takes Glipizide,Metformin,Invokana,and Lantus daily.Average fasting blood sugar runs about 140    Past Surgical History:  Procedure Laterality Date  . ABDOMINAL AORTIC ENDOVASCULAR STENT GRAFT N/A 05/22/2015   Procedure: ABDOMINAL AORTIC ENDOVASCULAR STENT GRAFT;  Surgeon: Serafina Mitchell, MD;  Location: [facility];  Service: Vascular;  Laterality: N/A;  . CARDIAC CATHETERIZATION  2001  . COLONOSCOPY N/A 03/21/2014   Procedure: COLONOSCOPY;  Surgeon: Rogene Houston, MD;  [facility];  Service: Endoscopy;  Laterality: N/A;  855 - moved to 12/4 @ 8:30 - Ann notified pt  . CORONARY ARTERY BYPASS GRAFT  2001   unsure if it was 3 or 4  . DOPPLER ECHOCARDIOGRAPHY     2011 38% EF.2013 EF 30-35%  . IR FLUORO GUIDE CV LINE RIGHT  07/26/2019  . IR REMOVAL TUN CV CATH W/O FL  09/10/2019  . IR US GUIDE VASC ACCESS RIGHT  07/26/2019  . LEFT HEART CATH AND CORS/GRAFTS ANGIOGRAPHY N/A 07/29/2019   Procedure: LEFT HEART CATH AND CORS/GRAFTS ANGIOGRAPHY;  Surgeon: Larey Dresser, MD;  Location: [facility];  Service: Cardiovascular;  Laterality: N/A;    Current Outpatient Medications  Medication Sig Dispense Refill  . albuterol (VENTOLIN HFA) 108 (90 Base) MCG/ACT inhaler Inhale 1-2 puffs into  the lungs every 4 (four) hours as needed for wheezing or shortness of breath.     [name] amiodarone (PACERONE) 200 MG tablet Take 1 tablet (200 mg total) by mouth 2 (two) times daily. 60 tablet 3  . apixaban (ELIQUIS) 5 MG TABS tablet Take 1 tablet (5 mg total) by mouth 2 (two) times daily. 60 tablet 6  . atorvastatin (LIPITOR) 80 MG tablet Take 80 mg by mouth daily.    . ferrous sulfate 325 (65 FE) MG tablet Take 325 mg by mouth daily with breakfast.    . FORA LANCETS MISC Test bid as directed. E11.65. 100 each 5  . glucose blood (FORA V10 BLOOD GLUCOSE TEST) test strip Use as instructed bid. E11.65 100 each 5    . glucose blood test strip Use as instructed 100 each 2  . LANCETS ULTRA THIN MISC Test BG every morning 100 each 5  . LITETOUCH PEN NEEDLES 31G X 8 MM MISC USE AT BEDTIME AS DIRECTED. 50 each 5  . metFORMIN (GLUCOPHAGE) 500 MG tablet Take 1,000 mg by mouth 2 (two) times daily with a meal.     . Multiple Vitamin (MULTIVITAMIN WITH MINERALS) TABS tablet Take 1 tablet by mouth daily.    [name] omeprazole (PRILOSEC OTC) 20 MG tablet Take 20 mg by mouth daily.    [name] oxymetazoline (AFRIN) 0.05 % nasal spray Place 2 sprays into both nostrils 2 (two) times daily as needed for congestion.    . sacubitril-valsartan (ENTRESTO) 49-51 MG Take 1 tablet by mouth 2 (two) times daily. 60 tablet 3  . sennosides-docusate sodium (SENOKOT-S) 8.6-50 MG tablet Take 1 tablet by mouth daily.    . insulin glargine (LANTUS SOLOSTAR) 100 UNIT/ML Solostar Pen Inject 25 Units into the skin at bedtime. 7.5 mL 0   No current facility-administered medications for this visit.    Allergies:   Patient has no known allergies.   Social History:  The patient  reports that he has quit smoking. He quit after 40.00 years of use. He has never used smokeless tobacco. He reports that he does not drink alcohol or use drugs.   ROS:  Please see the history of present illness.   All other systems are personally reviewed and negative.    Exam:    Vital Signs:  BP 131/61   Pulse 68   Ht 6' (1.829 m)   Wt 173 lb (78.5 kg)   BMI 23.46 kg/m   Well sounding, alert and conversant   Labs/Other Tests and Data Reviewed:    Recent Labs: 07/13/2019: TSH 1.873 07/29/2019: Magnesium 1.8 08/04/2019: ALT 28 09/10/2019: B Natriuretic Peptide 1,542.0; BUN 14; Creatinine, Ser 1.34; Hemoglobin 11.1; Platelets 242; Potassium 4.5; Sodium 140   Wt Readings from Last 3 Encounters:  09/25/19 173 lb (78.5 kg)  09/10/19 175 lb (79.4 kg)  08/23/19 177 lb 9.6 oz (80.6 kg)     Last device remote is reviewed from [facility] PDF which reveals normal device  function,    ASSESSMENT & PLAN:    1.  Typical atrial flutter and afib The patient has symptomatic, persistent typical appearing atrial flutter.  This is his primary arrhythmias.  He also has had atrial fibrillation. he is on amiodarone currently.  I have advised that he take amiodarone 200mg  daily. Chads2vasc score is at least 5.  he is anticoagulated with eliquis . Therapeutic strategies for afib including medicine and ablation were discussed in detail with the patient today. Risk, benefits, and alternatives to EP  study and radiofrequency ablation for afib were also discussed in detail today. These risks include but are not limited to stroke, bleeding, vascular damage, tamponade, perforation, damage to the esophagus, lungs, and other structures, pulmonary vein stenosis, worsening renal function, and death. The patient understands these risk and wishes to proceed.  We will therefore proceed with catheter ablation at the next available time.  Carto, ICE, anesthesia are requested for the procedure.  Will also obtain cardiac CT prior to the procedure to exclude LAA thrombus and further evaluate atrial anatomy.  2. Ischemic CM/ chronic systolic dysfunction Doing well s/p ICD LBBB with QRS 150 msec.  We will revisit consideration of CRT upgrade after recovered from ablation  3. Hypertensive cardiovascular disease Stable No change required today    Risks, benefits and potential toxicities for medications prescribed and/or refilled reviewed with patient today.     Patient Risk:  after full review of this patients clinical status, I feel that they are at high risk at this time.  Today, I have spent 20 minutes with the patient with telehealth technology discussing arrhythmia management .    Army Fossa, MD  09/25/2019 3:39 PM     [place] [address] [phone] (office) [phone] (fax)

## 2019-09-26 ENCOUNTER — Telehealth (INDEPENDENT_AMBULATORY_CARE_PROVIDER_SITE_OTHER): Payer: Medicare Other | Admitting: Cardiology

## 2019-09-26 ENCOUNTER — Encounter: Payer: Self-pay | Admitting: Cardiology

## 2019-09-26 VITALS — BP 132/78 | HR 70 | Ht 72.0 in | Wt 174.0 lb

## 2019-09-26 DIAGNOSIS — I2510 Atherosclerotic heart disease of native coronary artery without angina pectoris: Secondary | ICD-10-CM

## 2019-09-26 DIAGNOSIS — I10 Essential (primary) hypertension: Secondary | ICD-10-CM

## 2019-09-26 DIAGNOSIS — Z95810 Presence of automatic (implantable) cardiac defibrillator: Secondary | ICD-10-CM

## 2019-09-26 DIAGNOSIS — I255 Ischemic cardiomyopathy: Secondary | ICD-10-CM

## 2019-09-26 DIAGNOSIS — I5022 Chronic systolic (congestive) heart failure: Secondary | ICD-10-CM

## 2019-09-26 NOTE — Progress Notes (Signed)
Remote ICD transmission.   

## 2019-09-27 ENCOUNTER — Telehealth: Payer: Self-pay

## 2019-09-27 DIAGNOSIS — I4891 Unspecified atrial fibrillation: Secondary | ICD-10-CM

## 2019-09-27 DIAGNOSIS — I480 Paroxysmal atrial fibrillation: Secondary | ICD-10-CM

## 2019-09-27 NOTE — Telephone Encounter (Signed)
-----   Message from Stanton Kidney, RN sent at 09/27/2019  8:24 AM EDT -----  ----- Message ----- From: Thompson Grayer, MD Sent: 09/25/2019   3:44 PM EDT To: Stanton Kidney, RN  Afib ablation Carto. ICE, anesthesia  Cardiac CT prior to the procedure  Clarify amiodarone is 200mg  daily.

## 2019-09-27 NOTE — Telephone Encounter (Signed)
Pt was getting his oil changed.  Advised would send him some available dates via [place].  He will review and send a message.

## 2019-09-30 MED ORDER — AMIODARONE HCL 200 MG PO TABS
200.0000 mg | ORAL_TABLET | Freq: Every day | ORAL | 3 refills | Status: DC
Start: 2019-09-30 — End: 2019-12-06

## 2019-09-30 MED ORDER — METOPROLOL TARTRATE 50 MG PO TABS: ORAL_TABLET | ORAL | 0 refills | Status: AC

## 2019-09-30 NOTE — Telephone Encounter (Signed)
Pt scheduled for afib ablation on August 20  Mailing lab orders so Pt can get lab work at [facility] near him.  Advised to get August 2-3.  Pt written instruction given for covid test  Instructions mailed.  Need to schedule covid test in august

## 2019-10-01 NOTE — Progress Notes (Signed)
Unable to contact the patient  Kerin Ransom PA-C 10/01/2019 9:41 AM

## 2019-10-07 ENCOUNTER — Encounter (HOSPITAL_COMMUNITY): Payer: Self-pay

## 2019-10-07 ENCOUNTER — Telehealth: Payer: Self-pay

## 2019-10-07 NOTE — Telephone Encounter (Signed)
-----   Message from Sanda Klein, MD sent at 10/01/2019  3:41 PM EDT ----- Remote reviewed.   Not pacemaker dependent. Battery status is good. Lead measurements are stable. Heart rate histogram is favorable. No clinically significant episodes of high ventricular rate. Single lead device. Heart rate histograms suggest absence of atrial fibrillation (at least not with RVR) since last download. Thoracic impedance suggests trend towards hypervolemia.  Can we please call and see if he has dyspnea/edema. Enroll in device HF monitoring with [facility], please. Appointment with me in 1-2 months please, sooner if symptomatic.

## 2019-10-07 NOTE — Telephone Encounter (Signed)
Patient referred to [facility] by Dr Sallyanne Kuster.  His heart care is managed by Dr Haroldine Laws, Dr Sallyanne Kuster and Dr Rayann Heman.  Attempted ICM referral call and no answer.  Patient has sent my chart message on 10/07/2019 to Dr Haroldine Laws stating he has gained 4 lbs and wants to know if he should start Lasix again.  Will attempt to call back patient.

## 2019-10-11 NOTE — Telephone Encounter (Signed)
Covid test scheduled.

## 2019-10-18 ENCOUNTER — Ambulatory Visit (INDEPENDENT_AMBULATORY_CARE_PROVIDER_SITE_OTHER): Payer: Medicare Other | Admitting: Cardiovascular Disease

## 2019-10-18 ENCOUNTER — Other Ambulatory Visit: Payer: Self-pay

## 2019-10-18 ENCOUNTER — Encounter: Payer: Self-pay | Admitting: Cardiovascular Disease

## 2019-10-18 VITALS — BP 112/67 | HR 78 | Ht 72.0 in | Wt 178.6 lb

## 2019-10-18 DIAGNOSIS — I483 Typical atrial flutter: Secondary | ICD-10-CM

## 2019-10-18 DIAGNOSIS — I2510 Atherosclerotic heart disease of native coronary artery without angina pectoris: Secondary | ICD-10-CM

## 2019-10-18 DIAGNOSIS — Z95810 Presence of automatic (implantable) cardiac defibrillator: Secondary | ICD-10-CM

## 2019-10-18 DIAGNOSIS — I7121 Aneurysm of the ascending aorta, without rupture: Secondary | ICD-10-CM

## 2019-10-18 DIAGNOSIS — I4819 Other persistent atrial fibrillation: Secondary | ICD-10-CM

## 2019-10-18 DIAGNOSIS — Z95828 Presence of other vascular implants and grafts: Secondary | ICD-10-CM

## 2019-10-18 DIAGNOSIS — I712 Thoracic aortic aneurysm, without rupture: Secondary | ICD-10-CM

## 2019-10-18 DIAGNOSIS — I10 Essential (primary) hypertension: Secondary | ICD-10-CM

## 2019-10-18 DIAGNOSIS — I5022 Chronic systolic (congestive) heart failure: Secondary | ICD-10-CM

## 2019-10-18 DIAGNOSIS — Z5181 Encounter for therapeutic drug level monitoring: Secondary | ICD-10-CM

## 2019-10-18 DIAGNOSIS — E1159 Type 2 diabetes mellitus with other circulatory complications: Secondary | ICD-10-CM

## 2019-10-18 DIAGNOSIS — E7800 Pure hypercholesterolemia, unspecified: Secondary | ICD-10-CM

## 2019-10-18 DIAGNOSIS — Z79899 Other long term (current) drug therapy: Secondary | ICD-10-CM

## 2019-10-18 MED ORDER — ENTRESTO 97-103 MG PO TABS: 1.0000 | ORAL_TABLET | Freq: Two times a day (BID) | ORAL | 11 refills | Status: AC

## 2019-10-18 NOTE — Patient Instructions (Addendum)
Medication Instructions:  INCREASE the Entresto to 97-103 mg twice daily  *If you need a refill on your cardiac medications before your next appointment, please call your pharmacy*   Lab Work: None ordered If you have labs (blood work) drawn today and your tests are completely normal, you will receive your results only by: [name] MyChart Message (if you have MyChart) OR . A paper copy in the mail If you have any lab test that is abnormal or we need to change your treatment, we will call you to review the results.   Testing/Procedures: None ordered   Follow-Up: At [facility], you and your health needs are our priority.  As part of our continuing mission to provide you with exceptional heart care, we have created designated Provider Care Teams.  These Care Teams include your primary Cardiologist (physician) and Advanced Practice Providers (APPs -  Physician Assistants and Nurse Practitioners) who all work together to provide you with the care you need, when you need it.  We recommend signing up for the patient portal called "MyChart".  Sign up information is provided on this After Visit Summary.  MyChart is used to connect with patients for Virtual Visits (Telemedicine).  Patients are able to view lab/test results, encounter notes, upcoming appointments, etc.  Non-urgent messages can be sent to your provider as well.   To learn more about what you can do with MyChart, go to [email or web address].    Your next appointment:   Follow up with Dr. Sallyanne Kuster or an APP in 6 weeks (before your ablation on 8/20)

## 2019-10-21 ENCOUNTER — Encounter: Payer: Self-pay | Admitting: Cardiovascular Disease

## 2019-10-21 DIAGNOSIS — Z95828 Presence of other vascular implants and grafts: Secondary | ICD-10-CM | POA: Insufficient documentation

## 2019-10-21 NOTE — Progress Notes (Signed)
Cardiology Office Note    Date:  10/21/2019   ID:  JIANNI BATTEN, DOB 1945-05-05, MRN [id number]  PCP:  Redmond School, MD  Cardiologist:  Quay Burow, M.D. Sanda Klein, MD   Chief Complaint  Patient presents with  . Congestive Heart Failure  . Atrial Fibrillation    History of Present Illness:  ANTAEUS Ross is a 74 y.o. male with CAD s/p CABG, CHF, AAA s/p EVAR who had a recent protracted hospitalization for acute on chronic heart failure exacerbation with low cardiac output in the setting of persistent typical atrial flutter and atrial fibrillation.  He required intravenous inotrope support with milrinone for several weeks but has since weaned off.  He is a Actor.  He is doing much better.  He denies orthopnea or PND and has NYHA functional class II exertional dyspnea.  He has not had angina, palpitations, dizziness or syncope.  He is maintaining sinus rhythm on amiodarone and is compliant with Eliquis anticoagulation.  He is scheduled to undergo radiofrequency ablation of his atrial fibrillation on August 20 with Dr. Thompson Grayer.  He has a left bundle branch block that developed after implantation of his single-chamber ICD in 2010.  There is a big discrepancy between his home scale which today showed 169 pounds in the office scale which shows 178.6 pounds.  He has been stable at this weight for the last couple of weeks at home.  ECG today shows normal sinus rhythm.  Interrogation of his device shows normal function with an estimated battery longevity of 2 years.  He has not had any episodes of VT.  His heart rate histogram also suggest that he has not had any atrial fibrillation, although that cannot be said with certainty with a single-chamber device.  He has developed a nice separation between daytime and nighttime heart rates, consistent with good heart failure compensation.  His thoracic impedance ([facility]) has stayed within normal range since hospital  discharge.  Most recent labs show creatinine of 1.34 and potassium of 4.5.  Glycemic control is suboptimal with a hemoglobin A1c of 8.6%.  He presented with an anterior wall myocardial infarction in 2001 treated with coronary artery bypass grafting by Dr. Roxy Manns (LIMA to his LAD, SVGs to a ramus branch, circumflex and PDA). His EF was in the 35% range. He underwent ICD implantation (single-chamber St. Jude Fortify) for primary prevention October 2010. He has not received either appropriate or inappropriate shocks.  During hospitalization for heart failure exacerbation due to persistent atrial fibrillation in March 2021 EF was less than 20%.  The LV was also moderately dilated with an end diastolic diameter of 6.5 cm, but repeat echocardiogram in May 2021 showed returned to baseline EF of 30-35% and slight reduction left ventricular end-diastolic diameter at 6.1 cm.  Coronary angiography in April 2021 showed occluded native coronaries with patent LIMA to LAD and patent sequential SVG to ramus and OM, left to right collaterals to PDA.  His other problems include a known abdominal aortic aneurysm s/p EVAR 05/2015, bilateral renal artery stenosis, hypertension, hyperlipidemia and non-insulin-requiring diabetes. He has chronic shortness of breath, likely related to a history of tobacco abuse, having smoked 40-pack-years, quit 2001 at the time of his open heart surgery. The stress Myoview performed 04/07/11 showed anterior scar without ischemia.  Past Medical History:  Diagnosis Date  . Abdominal aortic aneurysm ([place])   . AICD (automatic cardioverter/defibrillator) present    St Jude  . Anemia    iron  deficiency anemia,takes iron pill daily  . Arthritis   . Coronary artery disease    AWMI 2001  . Diverticulosis   . GERD (gastroesophageal reflux disease)    takes Protonix daily  . History of colon polyps    benign  . History of migraine 40 yrs ago  . History of shingles   . Hyperlipidemia    takes  Atorvastatin and Niacin daily  . Hypertension    takes Diovan,Spironolactone, and Carvedilol daily  . ICD (implantable cardioverter-defibrillator), single, in situ   . Ischemic cardiomyopathy   . Joint swelling   . LV dysfunction    takes Digoxin daily  . Myocardial infarction ([place]) 2001  . Pneumonia 40 yrs ago   hx of  . Shortness of breath dyspnea    with exertion.States he was a smoker for yr  . Status post coronary artery bypass grafting   . Type 2 diabetes mellitus (HCC)    takes Glipizide,Metformin,Invokana,and Lantus daily.Average fasting blood sugar runs about 140    Past Surgical History:  Procedure Laterality Date  . ABDOMINAL AORTIC ENDOVASCULAR STENT GRAFT N/A 05/22/2015   Procedure: ABDOMINAL AORTIC ENDOVASCULAR STENT GRAFT;  Surgeon: Serafina Mitchell, MD;  Location: [facility];  Service: Vascular;  Laterality: N/A;  . CARDIAC CATHETERIZATION  2001  . COLONOSCOPY N/A 03/21/2014   Procedure: COLONOSCOPY;  Surgeon: Rogene Houston, MD;  [facility];  Service: Endoscopy;  Laterality: N/A;  855 - moved to 12/4 @ 8:30 - Ann notified pt  . CORONARY ARTERY BYPASS GRAFT  2001   unsure if it was 3 or 4  . DOPPLER ECHOCARDIOGRAPHY     2011 38% EF.2013 EF 30-35%  . IR FLUORO GUIDE CV LINE RIGHT  07/26/2019  . IR REMOVAL TUN CV CATH W/O FL  09/10/2019  . IR US GUIDE VASC ACCESS RIGHT  07/26/2019  . LEFT HEART CATH AND CORS/GRAFTS ANGIOGRAPHY N/A 07/29/2019   Procedure: LEFT HEART CATH AND CORS/GRAFTS ANGIOGRAPHY;  Surgeon: Jordan Dresser, MD;  Location: [facility];  Service: Cardiovascular;  Laterality: N/A;    Current Medications: Outpatient Medications Prior to Visit  Medication Sig Dispense Refill  . albuterol (VENTOLIN HFA) 108 (90 Base) MCG/ACT inhaler Inhale 1-2 puffs into the lungs every 4 (four) hours as needed for wheezing or shortness of breath.     [name] amiodarone (PACERONE) 200 MG tablet Take 1 tablet (200 mg total) by mouth daily. 90 tablet 3  . apixaban  (ELIQUIS) 5 MG TABS tablet Take 1 tablet (5 mg total) by mouth 2 (two) times daily. 60 tablet 6  . atorvastatin (LIPITOR) 80 MG tablet Take 80 mg by mouth daily.    . ferrous sulfate 325 (65 FE) MG tablet Take 325 mg by mouth daily with breakfast.    . FORA LANCETS MISC Test bid as directed. E11.65. 100 each 5  . glucose blood (FORA V10 BLOOD GLUCOSE TEST) test strip Use as instructed bid. E11.65 100 each 5  . glucose blood test strip Use as instructed 100 each 2  . LANCETS ULTRA THIN MISC Test BG every morning 100 each 5  . LITETOUCH PEN NEEDLES 31G X 8 MM MISC USE AT BEDTIME AS DIRECTED. 50 each 5  . metFORMIN (GLUCOPHAGE) 500 MG tablet Take 1,000 mg by mouth 2 (two) times daily with a meal.     . metoprolol tartrate (LOPRESSOR) 50 MG tablet Take one tablet by mouth 2 hours prior to cardiac CT 1 tablet 0  .  Multiple Vitamin (MULTIVITAMIN WITH MINERALS) TABS tablet Take 1 tablet by mouth daily.    [name] omeprazole (PRILOSEC OTC) 20 MG tablet Take 20 mg by mouth daily.    [name] oxymetazoline (AFRIN) 0.05 % nasal spray Place 2 sprays into both nostrils 2 (two) times daily as needed for congestion.    . sennosides-docusate sodium (SENOKOT-S) 8.6-50 MG tablet Take 1 tablet by mouth daily.    . sacubitril-valsartan (ENTRESTO) 49-51 MG Take 1 tablet by mouth 2 (two) times daily. 60 tablet 3  . insulin glargine (LANTUS SOLOSTAR) 100 UNIT/ML Solostar Pen Inject 25 Units into the skin at bedtime. 7.5 mL 0   No facility-administered medications prior to visit.     Allergies:   Patient has no known allergies.   Social History   Socioeconomic History  . Marital status: Widowed    Spouse name: Not on file  . Number of children: Not on file  . Years of education: Not on file  . Highest education level: Not on file  Occupational History  . Not on file  Tobacco Use  . Smoking status: Former Smoker    Years: 40.00  . Smokeless tobacco: Never Used  . Tobacco comment: quit smoking in 2001  Vaping Use  .  Vaping Use: Never used  Substance and Sexual Activity  . Alcohol use: No  . Drug use: No  . Sexual activity: Yes  Other Topics Concern  . Not on file  Social History Narrative  . Not on file   Social Determinants of Health   Financial Resource Strain:   . Difficulty of Paying Living Expenses:   Food Insecurity:   . Worried About Charity fundraiser in the Last Year:   . Arboriculturist in the Last Year:   Transportation Needs:   . Film/video editor (Medical):   [name] Lack of Transportation (Non-Medical):   Physical Activity:   . Days of Exercise per Week:   . Minutes of Exercise per Session:   Stress:   . Feeling of Stress :   Social Connections:   . Frequency of Communication with Friends and Family:   . Frequency of Social Gatherings with Friends and Family:   . Attends Religious Services:   . Active Member of Clubs or Organizations:   . Attends Archivist Meetings:   [name] Marital Status:      Family History:  The patient's family history includes COPD in his mother; Heart disease in his brother.   ROS:   Please see the history of present illness.    ROS all other systems are reviewed and are negative PHYSICAL EXAM:   VS:  BP 112/67   Pulse 78   Ht 6' (1.829 m)   Wt 178 lb 9.6 oz (81 kg)   SpO2 100%   BMI 24.22 kg/m     General: Alert, oriented x3, no distress, healthy left subclavian pacemaker site Head: no evidence of trauma, PERRL, EOMI, no exophtalmos or lid lag, no myxedema, no xanthelasma; normal ears, nose and oropharynx Neck: normal jugular venous pulsations and no hepatojugular reflux; brisk carotid pulses without delay and no carotid bruits Chest: clear to auscultation, no signs of consolidation by percussion or palpation, normal fremitus, symmetrical and full respiratory excursions Cardiovascular: Inferolaterally displaced apical impulse, regular rhythm, normal first and second heart sounds, no murmurs, rubs or gallops Abdomen: no tenderness  or distention, no masses by palpation, no abnormal pulsatility or arterial bruits, normal bowel sounds, no  hepatosplenomegaly Extremities: no clubbing, cyanosis or edema; 2+ radial, ulnar and brachial pulses bilaterally; 2+ right femoral, posterior tibial and dorsalis pedis pulses; 2+ left femoral, posterior tibial and dorsalis pedis pulses; no subclavian or femoral bruits Neurological: grossly nonfocal Psych: Normal mood and affect   Wt Readings from Last 3 Encounters:  10/18/19 178 lb 9.6 oz (81 kg)  09/26/19 174 lb (78.9 kg)  09/25/19 173 lb (78.5 kg)    Studies/Labs Reviewed:   EKG:  EKG is ordered today.  Shows normal sinus rhythm, left atrial abnormality, left bundle branch block  Recent Labs: 07/13/2019: TSH 1.873 07/29/2019: Magnesium 1.8 08/04/2019: ALT 28 09/10/2019: B Natriuretic Peptide 1,542.0; BUN 14; Creatinine, Ser 1.34; Hemoglobin 11.1; Platelets 242; Potassium 4.5; Sodium 140   Lipid Panel    Component Value Date/Time   CHOL 126 12/23/2016 0943   TRIG 136 12/23/2016 0943   HDL 41 12/23/2016 0943   CHOLHDL 3.1 12/23/2016 0943   CHOLHDL 7.8 (H) 06/14/2016 1458   VLDL NOT CALC 06/14/2016 1458   LDLCALC 58 12/23/2016 0943   Labs from 03/26/2019 Total cholesterol 171, HDL 52, LDL 92, triglycerides 153  ASSESSMENT:    1. Chronic systolic heart failure ([place])   2. Typical atrial flutter (HCC)   3. Persistent atrial fibrillation ([place])   4. Coronary artery disease involving native coronary artery of native heart without angina pectoris   5. ICD (implantable cardioverter-defibrillator), single, in situ   6. History of endovascular stent graft for abdominal aortic aneurysm (AAA)   7. Ascending aortic aneurysm ([place])   8. Essential hypertension   9. Hypercholesterolemia   10. Type 2 diabetes mellitus with vascular disease ([place])   11. Encounter for monitoring amiodarone therapy     PLAN:  In order of problems listed above:  1. CHF: Severe decompensation with  shocklike hemodynamic parameters in March, in the setting of persistent atrial fibrillation with RVR excellent recovery suggest that he had a component of tachycardia related cardiomyopathy, superimposed on pre-existing ischemic ventricular dysfunction.  Has been off intravenous inotropes and is back to NYHA functional class II.  Appears clinically euvolemic and euvolemia is also supported by his defibrillator thoracic impedance monitor.  "Dry weight" on his home scale appears to be just under 170 pounds, but our office scale shows weights that are 9-10 pounds higher than that.  He reports at home his systolic blood pressure is consistently 120-130 range.  We will increase his dose of Entresto to the maximum target dose today. 2. Atrial flutter: dominant arrhythmia although he also has atrial fibrillation 3. Afib: scheduled for ablation in August w Dr. Rayann Heman. 4. CAD s/p CABG: Asymptomatic, no angina pectoris.  Recent coronary angiography shows patent LIMA to LAD and SVG to ramus-OM with chronic occlusion of all 3 major coronary arteries and collateral filling of the distal branches of the RCA. 5. ICD: Treatment.  Continue remote downloads every 3 months.  Option for upgrade to dual chamber CRT-D device.  Less than chances of improvement after CRT however: He does not have a typical LBBB, QRS is right at 150 ms major etiology of cardiomyopathy is ischemic. 6. AAA s/p EVAR: followed by Dr. Trula Slade.  Last AAA ultrasound November 2020, is largely unchanged from the one performed a year earlier (maximum diameter 4.3 cm, no evidence of endoleak).   7.  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  Bovine arch.  It  is probably time to reevaluate this with a CT, but I do not think he be a good candidate for repeat sternotomy even if we found a large aneurysm. 8. HTN: Excellent control.  I do think he will tolerate the  higher dose of Entresto.  Prefer metoprolol to carvedilol for better ventricular rate control in case of breakthrough atrial fibrillation. 9. HLP: LDL cholesterol was rather high when checked in December 2020.  He is on maximum dose atorvastatin.  Work on improving glycemic control.  If this does not bring his LDL cholesterol to less than 70 would add ezetimibe. 10. DM: Shoot for target hemoglobin A1c 6-7%. 11. Amiodarone: Discussed the need to monitor liver and thyroid function tests every 6 months, avoid excessive sun exposure, promptly report unexpected respiratory symptoms.  Will be due for repeat labs in September, unless we discontinue the amiodarone following his ablation.  Medication Adjustments/Labs and Tests Ordered: Current medicines are reviewed at length with the patient today.  Concerns regarding medicines are outlined above.  Medication changes, Labs and Tests ordered today are listed in the Patient Instructions below. Patient Instructions  Medication Instructions:  INCREASE the Entresto to 97-103 mg twice daily  *If you need a refill on your cardiac medications before your next appointment, please call your pharmacy*   Lab Work: None ordered If you have labs (blood work) drawn today and your tests are completely normal, you will receive your results only by: [name] MyChart Message (if you have MyChart) OR . A paper copy in the mail If you have any lab test that is abnormal or we need to change your treatment, we will call you to review the results.   Testing/Procedures: None ordered   Follow-Up: At [facility], you and your health needs are our priority.  As part of our continuing mission to provide you with exceptional heart care, we have created designated Provider Care Teams.  These Care Teams include your primary Cardiologist (physician) and Advanced Practice Providers (APPs -  Physician Assistants and Nurse Practitioners) who all work together to provide you with the  care you need, when you need it.  We recommend signing up for the patient portal called "MyChart".  Sign up information is provided on this After Visit Summary.  MyChart is used to connect with patients for Virtual Visits (Telemedicine).  Patients are able to view lab/test results, encounter notes, upcoming appointments, etc.  Non-urgent messages can be sent to your provider as well.   To learn more about what you can do with MyChart, go to [email or web address].    Your next appointment:   Follow up with Dr. Sallyanne Kuster or an APP in 6 weeks (before your ablation on 8/20)     Signed, Sanda Klein, MD  10/21/2019 11:27 AM    [address], [place], [place]  [address] Phone: [phone]; Fax: [phone]

## 2019-10-23 ENCOUNTER — Other Ambulatory Visit: Payer: Self-pay

## 2019-10-23 ENCOUNTER — Encounter: Payer: Self-pay | Admitting: Pulmonary Disease

## 2019-10-23 ENCOUNTER — Ambulatory Visit (INDEPENDENT_AMBULATORY_CARE_PROVIDER_SITE_OTHER): Payer: Medicare Other | Admitting: Pulmonary Disease

## 2019-10-23 VITALS — BP 118/76 | HR 76 | Temp 98.0°F | Ht 72.0 in | Wt 181.0 lb

## 2019-10-23 DIAGNOSIS — I5042 Chronic combined systolic (congestive) and diastolic (congestive) heart failure: Secondary | ICD-10-CM | POA: Diagnosis not present

## 2019-10-23 DIAGNOSIS — R0600 Dyspnea, unspecified: Secondary | ICD-10-CM | POA: Diagnosis not present

## 2019-10-23 DIAGNOSIS — R0609 Other forms of dyspnea: Secondary | ICD-10-CM

## 2019-10-23 DIAGNOSIS — Z87891 Personal history of nicotine dependence: Secondary | ICD-10-CM | POA: Diagnosis not present

## 2019-10-23 DIAGNOSIS — R053 Chronic cough: Secondary | ICD-10-CM

## 2019-10-23 DIAGNOSIS — J849 Interstitial pulmonary disease, unspecified: Secondary | ICD-10-CM

## 2019-10-23 DIAGNOSIS — R05 Cough: Secondary | ICD-10-CM

## 2019-10-23 NOTE — Patient Instructions (Signed)
Will schedule pulmonary function test and high resolution CT chest  Follow up in 4 weeks

## 2019-10-23 NOTE — Progress Notes (Signed)
[facility]  Chief Complaint  Patient presents with  . Pulmoonary Consult    Referred by Dr. Gerarda Fraction for eval of interstitial PNA and dyspnea- previously seen by Dr Alva Garnet. Pt c/o DOE for approx 20 years. He gets SOB when he walks up a flight of stairs or outside in his yard.     Constitutional:  BP 118/76 (BP Location: Left Arm, Cuff Size: Normal)   Pulse 76   Temp 98 F (36.7 C) (Oral)   Ht 6' (1.829 m)   Wt 181 lb (82.1 kg)   SpO2 100% Comment: on RA  BMI 24.55 kg/m   Past Medical History:  AAA s/p endovascular stent graft, Chronic combined CHF, s/p AICD, Anemia, CAD s/p CABG, OA, Diverticulosis, GERD, Colon polyps, Migraine HA, Shingles, HLD, HTN, PNA, DM type 2, persistent A fib/flutter  Summary:  Jordan Ross is a 74 y.o. male former smoker with dyspnea and increased interstitial markings on chest xray.  Subjective:   He has noticed trouble with his breathing for years.  He was in hospital in March 2021 for CHF exacerbation, cardiogenic shock and A fib with RVR.  His chest xray from then showed diffuse increased interstitial markings.  He can walk on level ground w/o too much trouble.  He gets winded after walking up a flight of stairs.  He recovers after resting for about 5 minutes.  He feels his breathing is okay while asleep.  He gets cough with clear sputum in the morning, and sometimes later in the day.  He has been using albuterol bid and this helps.  He was never told he has COPD.  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  He quit smoking after he had a heart attack.    He denies fever, sweats, hemoptysis, chest pain, gland swelling.  Has chronic mild left leg swelling after fracturing his leg.  He is not aware of exposure to asbestos.  Labs from 09/10/19 showed Hb 11, PLT 242, WBC 7.2, Na 140, CO2 24, creatinine 1.34, Ca 9.2, BNP 1542.   Physical Exam:   Appearance - well kempt  ENMT - no sinus  tenderness, no nasal discharge, no oral exudate, Mallampati 2, poor dentition, wears dentures  Respiratory - no wheeze, or rales  CV - regular rate and rhythm, 2/6 SM  GI - soft, non tender  Lymph - no adenopathy noted in neck  Ext - no edema  Skin - no rashes  Neuro - normal strength, oriented x 3  Psych - normal mood and affect  Discussion:  He has dyspnea on exertion.  He was found to have increased interstitial marking on recent chest xray.  He has extensive prior history of smoking.  He has combined CHF and a fib/flutter.  He likely has COPD.  He could also have interstitial lung disease, but this is less certain.  Assessment/Plan:   Dyspnea on exertion. - will arrange for pulmonary function test and high resolution CT chest to further assess  History of tobacco abuse with chronic cough. - continue albuterol bid - will adjust inhaler regimen if needed after review of his PFT  Chronic combined CHF, A fib/flutter, valvular heart disease, ischemic cardiomyopathy. - followed by cardiology  A total of  48 minutes spent addressing patient care issues on day of visit.  Follow up:  Patient Instructions  Will schedule pulmonary function test and high resolution CT chest  Follow up in 4 weeks  Signature:  Chesley Mires, MD [place] Pager: [phone] 10/23/2019, 11:53 AM  Flow Sheet     Pulmonary tests:    Cardiac tests:   Echo 09/10/19 >> EF 30 to 35%, grade 2 DD, normal PASP, mod MR, aortic root 43 mm  Medications:   Allergies as of 10/23/2019   No Known Allergies     Medication List       Accurate as of October 23, 2019 11:53 AM. If you have any questions, ask your nurse or doctor.        albuterol 108 (90 Base) MCG/ACT inhaler Commonly known as: VENTOLIN HFA Inhale 1-2 puffs into the lungs every 4 (four) hours as needed for wheezing or shortness of breath.   amiodarone 200 MG tablet Commonly known as: PACERONE Take 1 tablet  (200 mg total) by mouth daily.   apixaban 5 MG Tabs tablet Commonly known as: ELIQUIS Take 1 tablet (5 mg total) by mouth 2 (two) times daily.   atorvastatin 80 MG tablet Commonly known as: LIPITOR Take 80 mg by mouth daily.   Entresto 97-103 MG Generic drug: sacubitril-valsartan Take 1 tablet by mouth 2 (two) times daily.   ferrous sulfate 325 (65 FE) MG tablet Take 325 mg by mouth daily with breakfast.   glucose blood test strip Use as instructed   glucose blood test strip Commonly known as: FORA V10 Blood Glucose Test Use as instructed bid. E11.65   Lancets Ultra Thin Misc Test BG every morning   FORA Lancets Misc Test bid as directed. E11.65.   Lantus SoloStar 100 UNIT/ML Solostar Pen Generic drug: insulin glargine Inject 25 Units into the skin at bedtime.   Litetouch Pen Needles 31G X 8 MM Misc Generic drug: Insulin Pen Needle USE AT BEDTIME AS DIRECTED.   metFORMIN 500 MG tablet Commonly known as: GLUCOPHAGE Take 1,000 mg by mouth 2 (two) times daily with a meal.   metoprolol tartrate 50 MG tablet Commonly known as: LOPRESSOR Take one tablet by mouth 2 hours prior to cardiac CT   multivitamin with minerals Tabs tablet Take 1 tablet by mouth daily.   omeprazole 20 MG tablet Commonly known as: PRILOSEC OTC Take 20 mg by mouth daily.   oxymetazoline 0.05 % nasal spray Commonly known as: AFRIN Place 2 sprays into both nostrils 2 (two) times daily as needed for congestion.   sennosides-docusate sodium 8.6-50 MG tablet Commonly known as: SENOKOT-S Take 1 tablet by mouth daily.       Past Surgical History:  He  has a past surgical history that includes Colonoscopy (N/A, 03/21/2014); doppler echocardiography; Coronary artery bypass graft (2001); Cardiac catheterization (2001); Abdominal aortic endovascular stent graft (N/A, 05/22/2015); IR Fluoro Guide CV Line Right (07/26/2019); IR US Guide Vasc Access Right (07/26/2019); LEFT HEART CATH AND CORS/GRAFTS  ANGIOGRAPHY (N/A, 07/29/2019); and IR Removal Tun Cv Cath W/O FL (09/10/2019).  Family History:  His family history includes COPD in his mother; Heart disease in his brother.  Social History:  He  reports that he quit smoking about 20 years ago. He has a 20.00 pack-year smoking history. He has never used smokeless tobacco. He reports that he does not drink alcohol and does not use drugs.

## 2019-11-05 NOTE — Telephone Encounter (Signed)
Attempted ICM intro call to patient and left message for return call with direct number.

## 2019-11-11 ENCOUNTER — Other Ambulatory Visit: Payer: Self-pay

## 2019-11-11 ENCOUNTER — Other Ambulatory Visit (HOSPITAL_COMMUNITY)
Admission: RE | Admit: 2019-11-11 | Discharge: 2019-11-11 | Disposition: A | Discharge status: Home / Self Care | Payer: Medicare Other | Source: Ambulatory Visit | Attending: Pulmonary Disease | Admitting: Pulmonary Disease

## 2019-11-11 DIAGNOSIS — Z20822 Contact with and (suspected) exposure to covid-19: Secondary | ICD-10-CM | POA: Insufficient documentation

## 2019-11-11 DIAGNOSIS — Z01812 Encounter for preprocedural laboratory examination: Secondary | ICD-10-CM | POA: Diagnosis not present

## 2019-11-11 LAB — SARS CORONAVIRUS 2 (TAT 6-24 HRS): SARS Coronavirus 2: NEGATIVE

## 2019-11-13 ENCOUNTER — Ambulatory Visit (HOSPITAL_COMMUNITY)
Admission: RE | Admit: 2019-11-13 | Discharge: 2019-11-13 | Disposition: A | Discharge status: Home / Self Care | Payer: Medicare Other | Source: Ambulatory Visit | Attending: Pulmonary Disease | Admitting: Pulmonary Disease

## 2019-11-13 ENCOUNTER — Telehealth: Payer: Self-pay

## 2019-11-13 ENCOUNTER — Other Ambulatory Visit: Payer: Self-pay

## 2019-11-13 DIAGNOSIS — Z79899 Other long term (current) drug therapy: Secondary | ICD-10-CM

## 2019-11-13 DIAGNOSIS — J849 Interstitial pulmonary disease, unspecified: Secondary | ICD-10-CM | POA: Insufficient documentation

## 2019-11-13 LAB — PULMONARY FUNCTION TEST
DL/VA % pred: 80 %
DL/VA: 3.22 ml/min/mmHg/L
DLCO unc % pred: 48 %
DLCO unc: 13.17 ml/min/mmHg
FEF 25-75 Post: 1.99 L/sec
FEF 25-75 Pre: 1.28 L/sec
FEF2575-%Change-Post: 55 %
FEF2575-%Pred-Post: 76 %
FEF2575-%Pred-Pre: 49 %
FEV1-%Change-Post: 7 %
FEV1-%Pred-Post: 62 %
FEV1-%Pred-Pre: 57 %
FEV1-Post: 2.16 L
FEV1-Pre: 2 L
FEV1FVC-%Change-Post: 5 %
FEV1FVC-%Pred-Pre: 98 %
FEV6-%Change-Post: 4 %
FEV6-%Pred-Post: 63 %
FEV6-%Pred-Pre: 60 %
FEV6-Post: 2.84 L
FEV6-Pre: 2.72 L
FEV6FVC-%Change-Post: 2 %
FEV6FVC-%Pred-Post: 105 %
FEV6FVC-%Pred-Pre: 103 %
FVC-%Change-Post: 2 %
FVC-%Pred-Post: 60 %
FVC-%Pred-Pre: 58 %
FVC-Post: 2.84 L
FVC-Pre: 2.78 L
Post FEV1/FVC ratio: 76 %
Post FEV6/FVC ratio: 100 %
Pre FEV1/FVC ratio: 72 %
Pre FEV6/FVC Ratio: 98 %
RV % pred: 86 %
RV: 2.23 L
TLC % pred: 69 %
TLC: 5.15 L

## 2019-11-13 MED ORDER — ALBUTEROL SULFATE (2.5 MG/3ML) 0.083% IN NEBU
2.5000 mg | INHALATION_SOLUTION | Freq: Once | RESPIRATORY_TRACT | Status: AC
  Administered 2019-11-13: 2.5 mg via RESPIRATORY_TRACT

## 2019-11-13 NOTE — Telephone Encounter (Addendum)
Patient returned call.  Provided ICM intro and patient agreeable to monthly ICM follow up.  Explained ICM monthly remote transmissions.  Provided ICM number and encouraged to call.

## 2019-11-13 NOTE — Telephone Encounter (Signed)
As soon as I read your message.... TY

## 2019-11-13 NOTE — Telephone Encounter (Addendum)
Patient returned ICM intro call and agreed to [facility] monthly follow up.  He reports he feels like he is retaining fluid at this time.    Patient currently experiencing:  swelling of both ankles   shortness of breath when climbing stairs (okay walking on flat surfaces)   weight gain from 169 lbs on 10/15/2019 to 182 lbs on 11/13/2019.    Routed to Dr Haroldine Laws for review and recommendations regarding Lasix and symptoms.  Dr Haroldine Laws please advise:   Pt confirms his is taking Lasix 20 mg daily but it is not listed in Epic medications.   1.  Do you want patient to continue on Lasix 20 mg daily for long term maintenance dosage? 2.  IF so, do you want to make a temporary Lasix adjustment for his current symptoms.     11/13/2019 ICM remote transmission results 11/13/2019 [facility] Impedance suggest normal fluid levels since 11/06/2019.   Impedance suggests possible fluid accumulation from 10/21/2019 to 11/05/2019.

## 2019-11-13 NOTE — Telephone Encounter (Signed)
Attempted ICM intro call to patient and left message for return call with direct number.   Patient has office visit with Dr Sallyanne Kuster on 11/25/2019.  Will advise Dr Sallyanne Kuster that unable to reach for ICM enrollment.

## 2019-11-14 ENCOUNTER — Other Ambulatory Visit: Payer: Self-pay | Admitting: Emergency Medicine

## 2019-11-14 DIAGNOSIS — Z79899 Other long term (current) drug therapy: Secondary | ICD-10-CM

## 2019-11-14 MED ORDER — FUROSEMIDE 20 MG PO TABS: 40.0000 mg | ORAL_TABLET | Freq: Every day | ORAL | 0 refills | Status: DC

## 2019-11-14 MED ORDER — METOLAZONE 2.5 MG PO TABS
2.5000 mg | ORAL_TABLET | ORAL | 3 refills | Status: DC
Start: 2019-11-14 — End: 2019-11-22

## 2019-11-14 MED ORDER — POTASSIUM CHLORIDE CRYS ER 20 MEQ PO TBCR: 20.0000 meq | EXTENDED_RELEASE_TABLET | Freq: Every day | ORAL | 3 refills | Status: DC

## 2019-11-14 NOTE — Addendum Note (Signed)
Addended by: Drake Leach on: 11/14/2019 05:25 PM   Modules accepted: Orders

## 2019-11-14 NOTE — Telephone Encounter (Signed)
Patient contacted and given new medication regimen. He will increase his Lasix to 40 mg daily  and add KCL 20 meQ daily. He will take the metolazone 2.5 mg with 40 mEQ of KCL today as ordered. BMET will be drawn at [facility] in [place] on Monday. Confirmed the pharmacy patient uses is [facility] in [place]. Patient to call if he has any questions about his medications. RX for meds and lab orders sent.

## 2019-11-14 NOTE — Telephone Encounter (Signed)
Increase lasix to 40 daily. Add kcl 20.   Have him take one dose of metolazone 2.5 with kcl 40 today.   Repeat check on Monday.  BMET Monday.   Call us if getting worse

## 2019-11-22 ENCOUNTER — Ambulatory Visit (INDEPENDENT_AMBULATORY_CARE_PROVIDER_SITE_OTHER): Payer: Medicare Other

## 2019-11-22 ENCOUNTER — Telehealth: Payer: Self-pay

## 2019-11-22 DIAGNOSIS — I5022 Chronic systolic (congestive) heart failure: Secondary | ICD-10-CM

## 2019-11-22 DIAGNOSIS — Z95810 Presence of automatic (implantable) cardiac defibrillator: Secondary | ICD-10-CM

## 2019-11-22 LAB — CUP PACEART REMOTE DEVICE CHECK
Battery Remaining Longevity: 22 mo
Battery Remaining Percentage: 18 %
Battery Voltage: 2.75 V
Brady Statistic RV Percent Paced: 1 %
Date Time Interrogation Session: 20210806031310
HighPow Impedance: 35 Ohm
Implantable Lead Implant Date: 20101028
Implantable Lead Location: 753860
Implantable Pulse Generator Implant Date: 20101028
Lead Channel Impedance Value: 340 Ohm
Lead Channel Pacing Threshold Amplitude: 1 V
Lead Channel Pacing Threshold Pulse Width: 0.4 ms
Lead Channel Sensing Intrinsic Amplitude: 7.3 mV
Lead Channel Setting Pacing Amplitude: 2.5 V
Lead Channel Setting Pacing Pulse Width: 0.4 ms
Lead Channel Setting Sensing Sensitivity: 0.5 mV
Pulse Gen Serial Number: 737526

## 2019-11-22 NOTE — Progress Notes (Signed)
EPIC Encounter for ICM Monitoring  Patient Name: Jordan Ross is a 74 y.o. male Date: 11/22/2019 Primary Care Physican: Redmond School, MD Primary Cardiologist: Croitoru/Bensimhon Electrophysiologist: Allred Weight:  unknown       1st ICM Remote Transmission.  Attempted call to patient and unable to reach.  Left detailed message per DPR regarding transmission. Transmission reviewed.    CorVue thoracic impedance returned to normal after patient was instructed to take Lasix 40 mg daily and took 1 time Metolazone dosage of 2.5 as ordered by Dr Haroldine Laws (see ICM referral call).   Prescribed:  Furosemide 20 mg take 2 tablets (40 mg total) by mouth daily  Potassium 20 mEq take 1 tablet daily  Recommendations: Left voice mail with ICM number and encouraged to call if experiencing any fluid symptoms.  Follow-up plan: ICM clinic phone appointment on 12/26/2019.   91 day device clinic remote transmission 12/25/2019.    EP/Cardiology Office Visits: 11/25/2019 with Dr. Sallyanne Kuster.    Copy of ICM check sent to Dr. Rayann Heman.   3 month ICM trend: 11/22/2019    1 Year ICM trend:       Rosalene Billings, RN 11/22/2019 4:53 PM

## 2019-11-22 NOTE — Telephone Encounter (Signed)
Remote ICM transmission received.  Attempted call to patient regarding ICM remote transmission and left detailed message per DPR.  Advised to return call for any fluid symptoms or questions.  

## 2019-11-25 ENCOUNTER — Other Ambulatory Visit: Payer: Self-pay

## 2019-11-25 ENCOUNTER — Encounter: Payer: Self-pay | Admitting: Cardiovascular Disease

## 2019-11-25 ENCOUNTER — Ambulatory Visit (INDEPENDENT_AMBULATORY_CARE_PROVIDER_SITE_OTHER): Payer: Medicare Other | Admitting: Cardiovascular Disease

## 2019-11-25 VITALS — BP 110/70 | HR 79 | Ht 72.0 in | Wt 169.4 lb

## 2019-11-25 DIAGNOSIS — I5022 Chronic systolic (congestive) heart failure: Secondary | ICD-10-CM | POA: Diagnosis not present

## 2019-11-25 DIAGNOSIS — E7800 Pure hypercholesterolemia, unspecified: Secondary | ICD-10-CM

## 2019-11-25 DIAGNOSIS — I7121 Aneurysm of the ascending aorta, without rupture: Secondary | ICD-10-CM

## 2019-11-25 DIAGNOSIS — I483 Typical atrial flutter: Secondary | ICD-10-CM | POA: Diagnosis not present

## 2019-11-25 DIAGNOSIS — I4819 Other persistent atrial fibrillation: Secondary | ICD-10-CM

## 2019-11-25 DIAGNOSIS — I712 Thoracic aortic aneurysm, without rupture: Secondary | ICD-10-CM

## 2019-11-25 DIAGNOSIS — E1159 Type 2 diabetes mellitus with other circulatory complications: Secondary | ICD-10-CM

## 2019-11-25 DIAGNOSIS — Z5181 Encounter for therapeutic drug level monitoring: Secondary | ICD-10-CM

## 2019-11-25 DIAGNOSIS — J449 Chronic obstructive pulmonary disease, unspecified: Secondary | ICD-10-CM

## 2019-11-25 DIAGNOSIS — I2510 Atherosclerotic heart disease of native coronary artery without angina pectoris: Secondary | ICD-10-CM

## 2019-11-25 DIAGNOSIS — Z95810 Presence of automatic (implantable) cardiac defibrillator: Secondary | ICD-10-CM

## 2019-11-25 DIAGNOSIS — Z79899 Other long term (current) drug therapy: Secondary | ICD-10-CM

## 2019-11-25 DIAGNOSIS — I10 Essential (primary) hypertension: Secondary | ICD-10-CM

## 2019-11-25 DIAGNOSIS — Z95828 Presence of other vascular implants and grafts: Secondary | ICD-10-CM

## 2019-11-25 MED ORDER — METOLAZONE 2.5 MG PO TABS
2.5000 mg | ORAL_TABLET | ORAL | 0 refills | Status: AC | PRN
Start: 2019-11-25 — End: 2020-02-23

## 2019-11-25 NOTE — Patient Instructions (Signed)
Medication Instructions:  METOLAZONE: Take one 2.5 mg tablet 30 minutes before the Furosemide only as directed by Dr. Sallyanne Kuster.   *If you need a refill on your cardiac medications before your next appointment, please call your pharmacy*   Lab Work: None ordered If you have labs (blood work) drawn today and your tests are completely normal, you will receive your results only by: [name] MyChart Message (if you have MyChart) OR . A paper copy in the mail If you have any lab test that is abnormal or we need to change your treatment, we will call you to review the results.   Testing/Procedures: None ordered   Follow-Up: At [facility], you and your health needs are our priority.  As part of our continuing mission to provide you with exceptional heart care, we have created designated Provider Care Teams.  These Care Teams include your primary Cardiologist (physician) and Advanced Practice Providers (APPs -  Physician Assistants and Nurse Practitioners) who all work together to provide you with the care you need, when you need it.  We recommend signing up for the patient portal called "MyChart".  Sign up information is provided on this After Visit Summary.  MyChart is used to connect with patients for Virtual Visits (Telemedicine).  Patients are able to view lab/test results, encounter notes, upcoming appointments, etc.  Non-urgent messages can be sent to your provider as well.   To learn more about what you can do with MyChart, go to [email or web address].    Your next appointment:   3 month(s) on a pacer day  The format for your next appointment:   In Person  Provider:   Sanda Klein, MD

## 2019-11-26 NOTE — Progress Notes (Signed)
Cardiology Office Note    Date:  11/26/2019   ID:  Jordan Ross, DOB May 03, 1945, MRN [id number]  PCP:  Redmond School, MD  Cardiologist:  Quay Burow, M.D. Sanda Klein, MD   No chief complaint on file.   History of Present Illness:  Jordan Ross is a 74 y.o. male with CAD s/p CABG, CHF, AAA s/p EVAR who had a recent protracted hospitalization for acute on chronic heart failure exacerbation with low cardiac output in the setting of persistent typical atrial flutter and atrial fibrillation.  He required intravenous inotrope support with milrinone for several weeks but has since weaned off.  He is a Actor.  He is doing reasonably well and has not required rehospitalization or adjustment in his dose of diuretics.  He is only taking 40 mg of furosemide daily.  He has NYHA functional class II-3 dyspnea.  He has a single-chamber defibrillator so we cannot be sure that he has not had any recurrent atrial arrhythmia, but his average ventricular rate has been relatively stable and low.  He was never aware of palpitations.  He has intermittent problems with edema, always a little worse in his right ankle, but does not have orthopnea or PND.  He has not had any falls, injuries or bleeding problems and is compliant with Eliquis anticoagulation.  He also takes amiodarone.  He is scheduled for atrial fibrillation ablation later this month, with Dr. Rayann Ross.  He recently underwent pulmonary function test that showed matching reduction in FVC and FEV1 to about 60% of predicted with mild improvement with bronchodilators.  He has a follow-up appointment with his pulmonologist Dr. Halford Ross later this month as well.  We would all prefer that he come off amiodarone.  He has a left bundle branch block, that developed subsequent to implantation of a single-chamber defibrillator in 2010.  He is in sinus rhythm with frequent PVCs today.  As before, there is a significant discrepancy between his home scale in  our office scale, off by 6.4 pounds today.  He weighs 163 pounds on his home scale.  Interrogation of his device shows normal function.  His Corvue has oscillated some but is currently in normal range.  He has not had any episodes of high ventricular rate.  That mentioned his average ventricular rate is fairly steady suggesting that he has not had atrial fibrillation, at least not any lengthy episodes.  He presented with an anterior wall myocardial infarction in 2001 treated with coronary artery bypass grafting by Dr. Roxy Ross (LIMA to his LAD, SVGs to a ramus branch, circumflex and PDA). His EF was in the 35% range. He underwent ICD implantation (single-chamber St. Jude Fortify) for primary prevention October 2010. He has not received either appropriate or inappropriate shocks.  During hospitalization for heart failure exacerbation due to persistent atrial fibrillation in March 2021 EF was less than 20%.  The LV was also moderately dilated with an end diastolic diameter of 6.5 cm, but repeat echocardiogram in May 2021 showed returned to baseline EF of 30-35% and slight reduction left ventricular end-diastolic diameter at 6.1 cm.  Coronary angiography in April 2021 showed occluded native coronaries with patent LIMA to LAD and patent sequential SVG to ramus and OM, left to right collaterals to PDA.  His other problems include a known abdominal aortic aneurysm s/p EVAR 05/2015, bilateral renal artery stenosis, hypertension, hyperlipidemia and non-insulin-requiring diabetes. He has chronic shortness of breath, likely related to a history of tobacco abuse, having smoked  40-pack-years, quit 2001 at the time of his open heart surgery. The stress Myoview performed 04/07/11 showed anterior scar without ischemia.  Past Medical History:  Diagnosis Date   Abdominal aortic aneurysm ([place])    AICD (automatic cardioverter/defibrillator) present    St Jude   Anemia    iron deficiency anemia,takes iron pill daily    Arthritis    Coronary artery disease    AWMI 2001   Diverticulosis    GERD (gastroesophageal reflux disease)    takes Protonix daily   History of colon polyps    benign   History of migraine 40 yrs ago   History of shingles    Hyperlipidemia    takes Atorvastatin and Niacin daily   Hypertension    takes Diovan,Spironolactone, and Carvedilol daily   ICD (implantable cardioverter-defibrillator), single, in situ    Ischemic cardiomyopathy    Joint swelling    LV dysfunction    takes Digoxin daily   Myocardial infarction ([place]) 2001   Pneumonia 40 yrs ago   hx of   Shortness of breath dyspnea    with exertion.States he was a smoker for yr   Status post coronary artery bypass grafting    Type 2 diabetes mellitus ([place])    takes Glipizide,Metformin,Invokana,and Lantus daily.Average fasting blood sugar runs about 140    Past Surgical History:  Procedure Laterality Date   ABDOMINAL AORTIC ENDOVASCULAR STENT GRAFT N/A 05/22/2015   Procedure: ABDOMINAL AORTIC ENDOVASCULAR STENT GRAFT;  Surgeon: Jordan Mitchell, MD;  Location: [facility];  Service: Vascular;  Laterality: N/A;   CARDIAC CATHETERIZATION  2001   COLONOSCOPY N/A 03/21/2014   Procedure: COLONOSCOPY;  Surgeon: Jordan Houston, MD;  [facility];  Service: Endoscopy;  Laterality: N/A;  855 - moved to 12/4 @ 8:30 - Ann notified pt   CORONARY ARTERY BYPASS GRAFT  2001   unsure if it was 3 or 4   DOPPLER ECHOCARDIOGRAPHY     2011 38% EF.2013 EF 30-35%   IR FLUORO GUIDE CV LINE RIGHT  07/26/2019   IR REMOVAL TUN CV CATH W/O FL  09/10/2019   IR US GUIDE VASC ACCESS RIGHT  07/26/2019   LEFT HEART CATH AND CORS/GRAFTS ANGIOGRAPHY N/A 07/29/2019   Procedure: LEFT HEART CATH AND CORS/GRAFTS ANGIOGRAPHY;  Surgeon: Jordan Dresser, MD;  Location: [facility];  Service: Cardiovascular;  Laterality: N/A;    Current Medications: Outpatient Medications Prior to Visit  Medication Sig Dispense Refill    albuterol (VENTOLIN HFA) 108 (90 Base) MCG/ACT inhaler Inhale 1-2 puffs into the lungs every 4 (four) hours as needed for wheezing or shortness of breath.      amiodarone (PACERONE) 200 MG tablet Take 1 tablet (200 mg total) by mouth daily. 90 tablet 3   apixaban (ELIQUIS) 5 MG TABS tablet Take 1 tablet (5 mg total) by mouth 2 (two) times daily. 60 tablet 6   atorvastatin (LIPITOR) 80 MG tablet Take 80 mg by mouth daily.     ferrous sulfate 325 (65 FE) MG tablet Take 325 mg by mouth daily with breakfast.     FORA LANCETS MISC Test bid as directed. E11.65. 100 each 5   furosemide (LASIX) 20 MG tablet Take 2 tablets (40 mg total) by mouth daily. 180 tablet 0   glucose blood (FORA V10 BLOOD GLUCOSE TEST) test strip Use as instructed bid. E11.65 100 each 5   glucose blood test strip Use as instructed 100 each 2   insulin  glargine (LANTUS) 100 UNIT/ML injection Inject 25 Units into the skin at bedtime as needed (High blood sugar).     LANCETS ULTRA THIN MISC Test BG every morning 100 each 5   LITETOUCH PEN NEEDLES 31G X 8 MM MISC USE AT BEDTIME AS DIRECTED. 50 each 5   metFORMIN (GLUCOPHAGE) 500 MG tablet Take 1,000 mg by mouth 2 (two) times daily with a meal.      metoprolol tartrate (LOPRESSOR) 50 MG tablet Take one tablet by mouth 2 hours prior to cardiac CT 1 tablet 0   Multiple Vitamin (MULTIVITAMIN WITH MINERALS) TABS tablet Take 1 tablet by mouth daily.     omeprazole (PRILOSEC OTC) 20 MG tablet Take 20 mg by mouth daily.     oxymetazoline (AFRIN) 0.05 % nasal spray Place 2 sprays into both nostrils 2 (two) times daily as needed for congestion.     sacubitril-valsartan (ENTRESTO) 97-103 MG Take 1 tablet by mouth 2 (two) times daily. 60 tablet 11   sennosides-docusate sodium (SENOKOT-S) 8.6-50 MG tablet Take 1 tablet by mouth daily.     potassium chloride SA (KLOR-CON) 20 MEQ tablet Take 1 tablet (20 mEq total) by mouth daily. 90 tablet 3   No facility-administered  medications prior to visit.     Allergies:   Patient has no known allergies.   Social History   Socioeconomic History   Marital status: Widowed    Spouse name: Not on file   Number of children: Not on file   Years of education: Not on file   Highest education level: Not on file  Occupational History   Not on file  Tobacco Use   Smoking status: Former Smoker    Packs/day: 0.50    Years: 40.00    Pack years: 20.00    Quit date: 04/19/1999    Years since quitting: 20.6   Smokeless tobacco: Never Used  Scientific laboratory technician Use: Never used  Substance and Sexual Activity   Alcohol use: No   Drug use: No   Sexual activity: Yes  Other Topics Concern   Not on file  Social History Narrative   Not on file   Social Determinants of Health   Financial Resource Strain:    Difficulty of Paying Living Expenses:   Food Insecurity:    Worried About Charity fundraiser in the Last Year:    Arboriculturist in the Last Year:   Transportation Needs:    Film/video editor (Medical):    Lack of Transportation (Non-Medical):   Physical Activity:    Days of Exercise per Week:    Minutes of Exercise per Session:   Stress:    Feeling of Stress :   Social Connections:    Frequency of Communication with Friends and Family:    Frequency of Social Gatherings with Friends and Family:    Attends Religious Services:    Active Member of Clubs or Organizations:    Attends Archivist Meetings:    Marital Status:      Family History:  The patient's family history includes COPD in his mother; Heart disease in his brother.   ROS:   Please see the history of present illness.    ROS all other systems are reviewed and are negative PHYSICAL EXAM:   VS:  BP 110/70    Pulse 79    Ht 6' (1.829 m)    Wt 169 lb 6.4 oz (76.8 kg)    SpO2  100%    BMI 22.97 kg/m     General: Alert, oriented x3, no distress, healthy left subclavian pacemaker site. Head: no evidence  of trauma, PERRL, EOMI, no exophtalmos or lid lag, no myxedema, no xanthelasma; normal ears, nose and oropharynx Neck: normal jugular venous pulsations and no hepatojugular reflux; brisk carotid pulses without delay and no carotid bruits Chest: clear to auscultation, no signs of consolidation by percussion or palpation, normal fremitus, symmetrical and full respiratory excursions Cardiovascular: normal position and quality of the apical impulse, regular rhythm, normal first and second heart sounds, no murmurs, rubs or gallops Abdomen: no tenderness or distention, no masses by palpation, no abnormal pulsatility or arterial bruits, normal bowel sounds, no hepatosplenomegaly Extremities: no clubbing, cyanosis, has 1+ edema of the right ankle, but no edema on the left; 2+ radial, ulnar and brachial pulses bilaterally; 2+ right femoral, posterior tibial and dorsalis pedis pulses; 2+ left femoral, posterior tibial and dorsalis pedis pulses; no subclavian or femoral bruits Neurological: grossly nonfocal Psych: Normal mood and affect   Wt Readings from Last 3 Encounters:  11/25/19 169 lb 6.4 oz (76.8 kg)  10/23/19 181 lb (82.1 kg)  10/18/19 178 lb 9.6 oz (81 kg)    Studies/Labs Reviewed:  PFTs from 11/05/2019 EKG:  EKG is ordered today.  It shows sinus rhythm with PVCs, left atrial normality, nonspecific intraventricular conduction delay (atypical LBBB), QRS 154 ms, left axis deviation, QTC 504 ms  Recent Labs: 07/13/2019: TSH 1.873 07/29/2019: Magnesium 1.8 08/04/2019: ALT 28 09/10/2019: B Natriuretic Peptide 1,542.0; BUN 14; Creatinine, Ser 1.34; Hemoglobin 11.1; Platelets 242; Potassium 4.5; Sodium 140   Lipid Panel    Component Value Date/Time   CHOL 126 12/23/2016 0943   TRIG 136 12/23/2016 0943   HDL 41 12/23/2016 0943   CHOLHDL 3.1 12/23/2016 0943   CHOLHDL 7.8 (H) 06/14/2016 1458   VLDL NOT CALC 06/14/2016 1458   LDLCALC 58 12/23/2016 0943    ASSESSMENT:    1. Chronic systolic heart  failure ([place])   2. Typical atrial flutter ([facility])   3. Persistent atrial fibrillation ([place])   4. Chronic obstructive pulmonary disease, unspecified COPD type ([place])   5. Coronary artery disease involving native coronary artery of native heart without angina pectoris   6. ICD (implantable cardioverter-defibrillator), single, in situ   7. History of endovascular stent graft for abdominal aortic aneurysm (AAA)   8. Ascending aortic aneurysm ([place])   9. Essential hypertension   10. Hypercholesterolemia   11. Type 2 diabetes mellitus with vascular disease ([place])   12. Encounter for monitoring amiodarone therapy     PLAN:  In order of problems listed above:  1. CHF: Severe decompensation with shocklike hemodynamic parameters in March, in the setting of persistent atrial fibrillation with RVR.  He has had subsequent excellent recovery suggest that he had a component of tachycardia related cardiomyopathy, superimposed on pre-existing ischemic ventricular dysfunction.  Has been off intravenous inotropes and is back to NYHA functional class II.  Appears clinically euvolemic and euvolemia is also supported by his defibrillator thoracic impedance monitor.  His home scale shows weights are 6-7 pounds less than our office scale.  On maximum dose Entresto as well as beta-blockers.  Consider switching to metoprolol succinate.  He has a small supply of metolazone 2.5 mg tablets ordered to use only as instructed for abrupt weight gain. 2. Atrial flutter: dominant arrhythmia although he also has atrial fibrillation 3. Afib: scheduled for ablation on August 20 w Dr. Rayann Ross.  4. Obstructive/restrictive lung disease: After his ablation would like to discontinue his amiodarone.  Has follow-up with Dr. Halford Ross on August 16. 5. CAD s/p CABG: Denies angina pectoris.  Graft dependent.  Recent coronary angiography shows patent LIMA to LAD and SVG to ramus-OM with chronic occlusion of all 3 major coronary arteries and collateral  filling of the distal branches of the RCA. 6. ICD: He has not received VT therapy.  Consider upgrade to dual-chamber CRT-D device (but he has primarily ischemic cardiomyopathy and does not have a typical LBBB, although his QRS is now well over 150 ms). 7. AAA s/p EVAR: followed by Dr. Trula Slade.  Last AAA ultrasound November 2020, is largely unchanged from the one performed a year earlier (maximum diameter 4.3 cm, no evidence of endoleak).   8.  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  Bovine arch.  It is probably time to reevaluate this with a CT, but I do not think he be a good candidate for repeat sternotomy even if we found a large aneurysm. 9. HTN: Excellent control.   10. HLP: LDL cholesterol was rather high when checked in December 2020.  He is on maximum dose atorvastatin.  Work on improving glycemic control.  If this does not bring his LDL cholesterol to less than 70 would add ezetimibe. 11. DM: Shoot for target hemoglobin A1c 6-7%. 12. Amiodarone: Discussed the need to monitor liver and thyroid function tests every 6 months, avoid excessive sun exposure, promptly report unexpected respiratory symptoms.  Will be due for repeat labs in September, unless we discontinue the amiodarone following his ablation.  Medication Adjustments/Labs and Tests Ordered: Current medicines are reviewed at length with the patient today.  Concerns regarding medicines are outlined above.  Medication changes, Labs and Tests ordered today are listed in the Patient Instructions below. Patient Instructions  Medication Instructions:  METOLAZONE: Take one 2.5 mg tablet 30 minutes before the Furosemide only as directed by Dr. Sallyanne Kuster.   *If you need a refill on your cardiac medications before your next appointment, please call your pharmacy*   Lab Work: None ordered If you have labs (blood work) drawn today and your  tests are completely normal, you will receive your results only by:  [place] (if you have MyChart) OR  A paper copy in the mail If you have any lab test that is abnormal or we need to change your treatment, we will call you to review the results.   Testing/Procedures: None ordered   Follow-Up: At [facility], you and your health needs are our priority.  As part of our continuing mission to provide you with exceptional heart care, we have created designated Provider Care Teams.  These Care Teams include your primary Cardiologist (physician) and Advanced Practice Providers (APPs -  Physician Assistants and Nurse Practitioners) who all work together to provide you with the care you need, when you need it.  We recommend signing up for the patient portal called "MyChart".  Sign up information is provided on this After Visit Summary.  MyChart is used to connect with patients for Virtual Visits (Telemedicine).  Patients are able to view lab/test results, encounter notes, upcoming appointments, etc.  Non-urgent messages can be sent to your provider as well.   To learn more about what you can do with MyChart, go to [email or web address].    Your next appointment:   3 month(s) on a  pacer day  The format for your next appointment:   In Person  Provider:   Sanda Klein, MD      Signed, Sanda Klein, MD  11/26/2019 5:37 PM    [address], [place], [place]  [address] Phone: [phone]; Fax: [phone]

## 2019-11-28 ENCOUNTER — Other Ambulatory Visit: Payer: Self-pay

## 2019-11-28 ENCOUNTER — Other Ambulatory Visit: Payer: Medicare Other

## 2019-11-28 ENCOUNTER — Telehealth (HOSPITAL_COMMUNITY): Payer: Self-pay | Admitting: *Deleted

## 2019-11-28 ENCOUNTER — Ambulatory Visit (HOSPITAL_COMMUNITY)
Admission: RE | Admit: 2019-11-28 | Discharge: 2019-11-28 | Disposition: A | Discharge status: Home / Self Care | Payer: Medicare Other | Source: Ambulatory Visit | Attending: Pulmonary Disease | Admitting: Pulmonary Disease

## 2019-11-28 DIAGNOSIS — J849 Interstitial pulmonary disease, unspecified: Secondary | ICD-10-CM | POA: Diagnosis not present

## 2019-11-28 DIAGNOSIS — Z79899 Other long term (current) drug therapy: Secondary | ICD-10-CM | POA: Diagnosis not present

## 2019-11-28 DIAGNOSIS — I2510 Atherosclerotic heart disease of native coronary artery without angina pectoris: Secondary | ICD-10-CM | POA: Diagnosis not present

## 2019-11-28 DIAGNOSIS — I712 Thoracic aortic aneurysm, without rupture: Secondary | ICD-10-CM | POA: Diagnosis not present

## 2019-11-28 DIAGNOSIS — I700 Atherosclerosis of aorta: Secondary | ICD-10-CM | POA: Diagnosis not present

## 2019-11-28 DIAGNOSIS — J84112 Idiopathic pulmonary fibrosis: Secondary | ICD-10-CM | POA: Diagnosis not present

## 2019-11-28 DIAGNOSIS — I4819 Other persistent atrial fibrillation: Secondary | ICD-10-CM

## 2019-11-28 NOTE — Telephone Encounter (Signed)
Attempted to call patient regarding upcoming cardiac CT appointment. Left message on voicemail with name and callback number  Simonne Boulos Tai RN Navigator Cardiac Imaging [facility] [phone] Office [phone] Cell  

## 2019-11-28 NOTE — Telephone Encounter (Signed)
Pt returning call regarding upcoming cardiac imaging study; pt verbalizes understanding of appt date/time, parking situation and where to check in, pre-test NPO status and medications ordered, and verified current allergies; name and call back number provided for further questions should they arise  Verdell Kincannon Tai RN Navigator Cardiac Imaging [facility] [phone] office [phone] cell  

## 2019-11-28 NOTE — Addendum Note (Signed)
Addended by: Saddie Benders E on: 11/28/2019 01:10 PM   Modules accepted: Orders

## 2019-11-28 NOTE — Telephone Encounter (Signed)
Called patient to confirm lab work today at [address] office after [place] with Cardiac imaging spoke to him about getting labs today.    Left voicemail for patient to call back

## 2019-11-28 NOTE — Telephone Encounter (Signed)
Confirmed labs with Daughter for today.

## 2019-11-29 ENCOUNTER — Telehealth (HOSPITAL_COMMUNITY): Payer: Self-pay | Admitting: *Deleted

## 2019-11-29 LAB — BASIC METABOLIC PANEL
BUN/Creatinine Ratio: 16 (ref 10–24)
BUN: 27 mg/dL (ref 8–27)
CO2: 26 mmol/L (ref 20–29)
Calcium: 9.7 mg/dL (ref 8.6–10.2)
Chloride: 99 mmol/L (ref 96–106)
Creatinine, Ser: 1.64 mg/dL — ABNORMAL HIGH (ref 0.76–1.27)
GFR calc Af Amer: 48 mL/min/{1.73_m2} — ABNORMAL LOW (ref >59)
GFR calc non Af Amer: 41 mL/min/{1.73_m2} — ABNORMAL LOW (ref >59)
Glucose: 127 mg/dL — ABNORMAL HIGH (ref 65–99)
Potassium: 5 mmol/L (ref 3.5–5.2)
Sodium: 142 mmol/L (ref 134–144)

## 2019-11-29 NOTE — Telephone Encounter (Signed)
Per pt request, reaching out to pt's daughter to inform her that her father will need to arrive to the hospital at 11:30am on Monday August 16 in order to receive a fluid infusion prior to cardiac CT scan.  Pt's daughter verbalized understanding.  Burley Saver, RN Navigator, cardiac imagaing Office: [phone]

## 2019-12-02 ENCOUNTER — Telehealth: Payer: Self-pay | Admitting: Pulmonary Disease

## 2019-12-02 ENCOUNTER — Ambulatory Visit (HOSPITAL_COMMUNITY)
Admission: RE | Admit: 2019-12-02 | Discharge: 2019-12-02 | Disposition: A | Discharge status: Home / Self Care | Payer: Medicare Other | Source: Ambulatory Visit | Attending: Internal Medicine | Admitting: Internal Medicine

## 2019-12-02 ENCOUNTER — Encounter (HOSPITAL_COMMUNITY)
Admission: RE | Admit: 2019-12-02 | Discharge: 2019-12-02 | Disposition: A | Discharge status: Home / Self Care | Payer: Medicare Other | Source: Ambulatory Visit | Attending: Internal Medicine | Admitting: Internal Medicine

## 2019-12-02 ENCOUNTER — Other Ambulatory Visit: Payer: Self-pay

## 2019-12-02 ENCOUNTER — Ambulatory Visit: Payer: Medicare Other | Admitting: Pulmonary Disease

## 2019-12-02 DIAGNOSIS — I4891 Unspecified atrial fibrillation: Secondary | ICD-10-CM | POA: Diagnosis not present

## 2019-12-02 LAB — BASIC METABOLIC PANEL
Anion gap: 11 (ref 5–15)
BUN: 26 mg/dL — ABNORMAL HIGH (ref 8–23)
CO2: 26 mmol/L (ref 22–32)
Calcium: 9.8 mg/dL (ref 8.9–10.3)
Chloride: 102 mmol/L (ref 98–111)
Creatinine, Ser: 1.71 mg/dL — ABNORMAL HIGH (ref 0.61–1.24)
GFR calc Af Amer: 45 mL/min — ABNORMAL LOW (ref >60)
GFR calc non Af Amer: 39 mL/min — ABNORMAL LOW (ref >60)
Glucose, Bld: 194 mg/dL — ABNORMAL HIGH (ref 70–99)
Potassium: 4.6 mmol/L (ref 3.5–5.1)
Sodium: 139 mmol/L (ref 135–145)

## 2019-12-02 MED ORDER — SODIUM CHLORIDE 0.9 % WEIGHT BASED INFUSION: 1.0000 mL/kg/h | INTRAVENOUS | Status: DC

## 2019-12-02 MED ORDER — IOHEXOL 350 MG/ML SOLN
80.0000 mL | Freq: Once | INTRAVENOUS | Status: AC | PRN
  Administered 2019-12-02: 80 mL via INTRAVENOUS

## 2019-12-02 MED ORDER — SODIUM CHLORIDE 0.9 % WEIGHT BASED INFUSION: 3.0000 mL/kg/h | INTRAVENOUS | Status: AC

## 2019-12-02 NOTE — Telephone Encounter (Signed)
HRCT chest 11/29/19 >> ascending aorta 4.2 cm, atherosclerosis; peripheral and basilar predominant interstitial coarsening, subpleural reticulation, mild GGO and traction BTX - non UIP pattern; mild centrilobular and paraseptal emphysema, 4 mm nodule RUL, mild air trapping   Pt was scheduled for ROV on 12/02/19 to review CT results, but apparently appointment was cancelled.  Please reschedule ROV with me or NP.

## 2019-12-02 NOTE — Progress Notes (Signed)
IV fluids of NS started at 1130 in Short Stay Procedural.  Unable to scan in [facility].  Rate was 230/1 hour and then 77/hr.

## 2019-12-03 ENCOUNTER — Ambulatory Visit (HOSPITAL_COMMUNITY): Payer: Medicare Other

## 2019-12-04 ENCOUNTER — Other Ambulatory Visit (HOSPITAL_COMMUNITY)
Admission: RE | Admit: 2019-12-04 | Discharge: 2019-12-04 | Disposition: A | Discharge status: Home / Self Care | Payer: Medicare Other | Source: Ambulatory Visit | Attending: Internal Medicine | Admitting: Internal Medicine

## 2019-12-04 DIAGNOSIS — Z01812 Encounter for preprocedural laboratory examination: Secondary | ICD-10-CM | POA: Insufficient documentation

## 2019-12-04 DIAGNOSIS — Z20822 Contact with and (suspected) exposure to covid-19: Secondary | ICD-10-CM | POA: Insufficient documentation

## 2019-12-04 LAB — SARS CORONAVIRUS 2 (TAT 6-24 HRS): SARS Coronavirus 2: NEGATIVE

## 2019-12-04 NOTE — Telephone Encounter (Signed)
ATC patient unable to reach LM to call back office (x1)  

## 2019-12-05 NOTE — Progress Notes (Signed)
Instructed patient on the following items: Arrival time 0830 Nothing to eat or drink after midnight No meds AM of procedure Responsible person to drive you home and stay with you for 24 hrs  Have you missed any doses of anti-coagulant on Eliquis, hasn't missed any doses,  Instructed patient to take both doses of Eliquis today.

## 2019-12-06 ENCOUNTER — Ambulatory Visit (HOSPITAL_COMMUNITY)
Admission: RE | Admit: 2019-12-06 | Discharge: 2019-12-06 | Disposition: A | Discharge status: Home / Self Care | Payer: Medicare Other | Attending: Internal Medicine | Admitting: Internal Medicine

## 2019-12-06 ENCOUNTER — Encounter (HOSPITAL_COMMUNITY): Payer: Self-pay | Admitting: Internal Medicine

## 2019-12-06 ENCOUNTER — Other Ambulatory Visit: Payer: Self-pay

## 2019-12-06 ENCOUNTER — Encounter (HOSPITAL_COMMUNITY)
Admission: RE | Disposition: A | Discharge status: Home / Self Care | Payer: Medicare Other | Source: Home / Self Care | Attending: Internal Medicine

## 2019-12-06 ENCOUNTER — Ambulatory Visit (HOSPITAL_COMMUNITY): Payer: Medicare Other | Admitting: Anesthesiology

## 2019-12-06 DIAGNOSIS — I110 Hypertensive heart disease with heart failure: Secondary | ICD-10-CM | POA: Diagnosis not present

## 2019-12-06 DIAGNOSIS — I714 Abdominal aortic aneurysm, without rupture: Secondary | ICD-10-CM | POA: Diagnosis not present

## 2019-12-06 DIAGNOSIS — E119 Type 2 diabetes mellitus without complications: Secondary | ICD-10-CM | POA: Diagnosis not present

## 2019-12-06 DIAGNOSIS — Z87891 Personal history of nicotine dependence: Secondary | ICD-10-CM | POA: Insufficient documentation

## 2019-12-06 DIAGNOSIS — I5022 Chronic systolic (congestive) heart failure: Secondary | ICD-10-CM | POA: Diagnosis not present

## 2019-12-06 DIAGNOSIS — Z95810 Presence of automatic (implantable) cardiac defibrillator: Secondary | ICD-10-CM | POA: Insufficient documentation

## 2019-12-06 DIAGNOSIS — Z79899 Other long term (current) drug therapy: Secondary | ICD-10-CM | POA: Diagnosis not present

## 2019-12-06 DIAGNOSIS — E785 Hyperlipidemia, unspecified: Secondary | ICD-10-CM | POA: Diagnosis not present

## 2019-12-06 DIAGNOSIS — I2510 Atherosclerotic heart disease of native coronary artery without angina pectoris: Secondary | ICD-10-CM | POA: Diagnosis not present

## 2019-12-06 DIAGNOSIS — Z794 Long term (current) use of insulin: Secondary | ICD-10-CM | POA: Diagnosis not present

## 2019-12-06 DIAGNOSIS — I255 Ischemic cardiomyopathy: Secondary | ICD-10-CM | POA: Diagnosis not present

## 2019-12-06 DIAGNOSIS — I483 Typical atrial flutter: Secondary | ICD-10-CM | POA: Diagnosis not present

## 2019-12-06 DIAGNOSIS — Z951 Presence of aortocoronary bypass graft: Secondary | ICD-10-CM | POA: Insufficient documentation

## 2019-12-06 DIAGNOSIS — K219 Gastro-esophageal reflux disease without esophagitis: Secondary | ICD-10-CM | POA: Diagnosis not present

## 2019-12-06 DIAGNOSIS — D509 Iron deficiency anemia, unspecified: Secondary | ICD-10-CM | POA: Diagnosis not present

## 2019-12-06 DIAGNOSIS — I4891 Unspecified atrial fibrillation: Secondary | ICD-10-CM | POA: Diagnosis not present

## 2019-12-06 DIAGNOSIS — Z7901 Long term (current) use of anticoagulants: Secondary | ICD-10-CM | POA: Insufficient documentation

## 2019-12-06 DIAGNOSIS — I10 Essential (primary) hypertension: Secondary | ICD-10-CM | POA: Insufficient documentation

## 2019-12-06 DIAGNOSIS — I252 Old myocardial infarction: Secondary | ICD-10-CM | POA: Diagnosis not present

## 2019-12-06 DIAGNOSIS — I480 Paroxysmal atrial fibrillation: Secondary | ICD-10-CM | POA: Diagnosis not present

## 2019-12-06 HISTORY — PX: ATRIAL FIBRILLATION ABLATION: EP1191

## 2019-12-06 LAB — CBC
HCT: 34.1 % — ABNORMAL LOW (ref 39.0–52.0)
Hemoglobin: 10.3 g/dL — ABNORMAL LOW (ref 13.0–17.0)
MCH: 26.9 pg (ref 26.0–34.0)
MCHC: 30.2 g/dL (ref 30.0–36.0)
MCV: 89 fL (ref 80.0–100.0)
Platelets: 218 10*3/uL (ref 150–400)
RBC: 3.83 MIL/uL — ABNORMAL LOW (ref 4.22–5.81)
RDW: 15.7 % — ABNORMAL HIGH (ref 11.5–15.5)
WBC: 7.3 10*3/uL (ref 4.0–10.5)
nRBC: 0 % (ref 0.0–0.2)

## 2019-12-06 LAB — POCT ACTIVATED CLOTTING TIME
Activated Clotting Time: 241 seconds
Activated Clotting Time: 279 seconds
Activated Clotting Time: 312 seconds

## 2019-12-06 LAB — GLUCOSE, CAPILLARY
Glucose-Capillary: 147 mg/dL — ABNORMAL HIGH (ref 70–99)
Glucose-Capillary: 170 mg/dL — ABNORMAL HIGH (ref 70–99)

## 2019-12-06 SURGERY — ATRIAL FIBRILLATION ABLATION
Anesthesia: General

## 2019-12-06 MED ORDER — HEPARIN SODIUM (PORCINE) 1000 UNIT/ML IJ SOLN
INTRAMUSCULAR | Status: AC
  Filled 2019-12-06: qty 1

## 2019-12-06 MED ORDER — LIDOCAINE 2% (20 MG/ML) 5 ML SYRINGE
INTRAMUSCULAR | Status: DC | PRN
  Administered 2019-12-06: 60 mg via INTRAVENOUS

## 2019-12-06 MED ORDER — ETOMIDATE 2 MG/ML IV SOLN
INTRAVENOUS | Status: DC | PRN
  Administered 2019-12-06: 20 mg via INTRAVENOUS

## 2019-12-06 MED ORDER — SODIUM CHLORIDE 0.9 % IV SOLN: INTRAVENOUS | Status: DC

## 2019-12-06 MED ORDER — DEXAMETHASONE SODIUM PHOSPHATE 10 MG/ML IJ SOLN
INTRAMUSCULAR | Status: DC | PRN
  Administered 2019-12-06: 5 mg via INTRAVENOUS

## 2019-12-06 MED ORDER — SUGAMMADEX SODIUM 200 MG/2ML IV SOLN
INTRAVENOUS | Status: DC | PRN
  Administered 2019-12-06: 100 mg via INTRAVENOUS
  Administered 2019-12-06: 200 mg via INTRAVENOUS

## 2019-12-06 MED ORDER — SODIUM CHLORIDE 0.9% FLUSH: 3.0000 mL | Freq: Two times a day (BID) | INTRAVENOUS | Status: DC

## 2019-12-06 MED ORDER — PHENYLEPHRINE HCL-NACL 10-0.9 MG/250ML-% IV SOLN
INTRAVENOUS | Status: DC | PRN
  Administered 2019-12-06: 40 ug/min via INTRAVENOUS

## 2019-12-06 MED ORDER — PROTAMINE SULFATE 10 MG/ML IV SOLN
INTRAVENOUS | Status: DC | PRN
  Administered 2019-12-06: 10 mg via INTRAVENOUS
  Administered 2019-12-06: 20 mg via INTRAVENOUS
  Administered 2019-12-06: 10 mg via INTRAVENOUS

## 2019-12-06 MED ORDER — FENTANYL CITRATE (PF) 250 MCG/5ML IJ SOLN
INTRAMUSCULAR | Status: DC | PRN
  Administered 2019-12-06: 50 ug via INTRAVENOUS

## 2019-12-06 MED ORDER — HEPARIN (PORCINE) IN NACL 1000-0.9 UT/500ML-% IV SOLN
INTRAVENOUS | Status: DC | PRN
  Administered 2019-12-06: 500 mL

## 2019-12-06 MED ORDER — HEPARIN SODIUM (PORCINE) 1000 UNIT/ML IJ SOLN
INTRAMUSCULAR | Status: DC | PRN
  Administered 2019-12-06 (×2): 5000 [IU] via INTRAVENOUS
  Administered 2019-12-06: 2000 [IU] via INTRAVENOUS

## 2019-12-06 MED ORDER — ROCURONIUM BROMIDE 10 MG/ML (PF) SYRINGE
PREFILLED_SYRINGE | INTRAVENOUS | Status: DC | PRN
  Administered 2019-12-06: 10 mg via INTRAVENOUS
  Administered 2019-12-06: 20 mg via INTRAVENOUS
  Administered 2019-12-06: 50 mg via INTRAVENOUS

## 2019-12-06 MED ORDER — HEPARIN SODIUM (PORCINE) 1000 UNIT/ML IJ SOLN
INTRAMUSCULAR | Status: DC | PRN
  Administered 2019-12-06: 14000 [IU] via INTRAVENOUS
  Administered 2019-12-06: 1000 [IU] via INTRAVENOUS

## 2019-12-06 MED ORDER — PROPOFOL 10 MG/ML IV BOLUS
INTRAVENOUS | Status: DC | PRN
  Administered 2019-12-06: 60 mg via INTRAVENOUS

## 2019-12-06 MED ORDER — MIDAZOLAM HCL 5 MG/5ML IJ SOLN
INTRAMUSCULAR | Status: DC | PRN
  Administered 2019-12-06: 1 mg via INTRAVENOUS

## 2019-12-06 MED ORDER — ONDANSETRON HCL 4 MG/2ML IJ SOLN
INTRAMUSCULAR | Status: DC | PRN
  Administered 2019-12-06: 4 mg via INTRAVENOUS

## 2019-12-06 SURGICAL SUPPLY — 21 items
BLANKET WARM UNDERBOD FULL ACC (MISCELLANEOUS) ×2 IMPLANT
CATH 8FR REPROCESSED SOUNDSTAR (CATHETERS) ×2 IMPLANT
CATH 8FR SOUNDSTAR REPROCESSED (CATHETERS) IMPLANT
CATH MAPPNG PENTARAY F 2-6-2MM (CATHETERS) IMPLANT
CATH SMTCH THERMOCOOL SF DF (CATHETERS) ×1 IMPLANT
CATH WEBSTER BI DIR CS D-F CRV (CATHETERS) ×1 IMPLANT
COVER SWIFTLINK CONNECTOR (BAG) ×2 IMPLANT
DEVICE CLOSURE PERCLS PRGLD 6F (VASCULAR PRODUCTS) IMPLANT
NDL BAYLIS TRANSSEPTAL 71CM (NEEDLE) IMPLANT
NEEDLE BAYLIS TRANSSEPTAL 71CM (NEEDLE) ×2 IMPLANT
PACK EP LATEX FREE (CUSTOM PROCEDURE TRAY) ×2
PACK EP LF (CUSTOM PROCEDURE TRAY) ×1 IMPLANT
PAD PRO RADIOLUCENT 2001M-C (PAD) ×2 IMPLANT
PATCH CARTO3 (PAD) ×1 IMPLANT
PENTARAY F 2-6-2MM (CATHETERS) ×2
PERCLOSE PROGLIDE 6F (VASCULAR PRODUCTS) ×8
SHEATH PINNACLE 7F 10CM (SHEATH) ×2 IMPLANT
SHEATH PINNACLE 9F 10CM (SHEATH) ×1 IMPLANT
SHEATH PROBE COVER 6X72 (BAG) ×1 IMPLANT
SHEATH SWARTZ TS SL2 63CM 8.5F (SHEATH) ×1 IMPLANT
TUBING SMART ABLATE COOLFLOW (TUBING) ×1 IMPLANT

## 2019-12-06 NOTE — Progress Notes (Signed)
Discharge instructions reviewed with patient and family. Verbalized understanding. 

## 2019-12-06 NOTE — Anesthesia Preprocedure Evaluation (Addendum)
Anesthesia Evaluation  Patient identified by MRN, date of birth, ID band Patient awake    Reviewed: Allergy & Precautions, NPO status , Patient's Chart, lab work & pertinent test results, reviewed documented beta blocker date and time   Airway Mallampati: II  TM Distance: >3 FB Neck ROM: Full    Dental  (+) Edentulous Upper, Poor Dentition, Missing, Loose, Dental Advisory Given   Pulmonary shortness of breath and with exertion, pneumonia, resolved, former smoker,    breath sounds clear to auscultation + decreased breath sounds      Cardiovascular Exercise Tolerance: Poor hypertension, Pt. on medications and Pt. on home beta blockers + CAD, + Past MI, + Peripheral Vascular Disease, +CHF and + DOE  + dysrhythmias Atrial Fibrillation + Cardiac Defibrillator  Rhythm:Irregular Rate:Normal  Hx/o Bilateral Renal artery stenosis Hx/o AAA S/P endovascular stent AWMI 2001 CABG 2001  Echo 08/21/19 1. Small linear apical filling defect seen with Definity contrast- suspect apical false tendon.. Left ventricular ejection fraction, by estimation, is 30 to 35%. The left ventricle has moderately decreased  function. The left ventricle demonstrates regional wall motion abnormalities (see scoring diagram/findings for description). The left ventricular internal cavity size was severely dilated. Left ventricular diastolic parameters are consistent with Grade  II diastolic dysfunction (pseudonormalization). Elevated left ventricular end-diastolic pressure. There is severe akinesis of the left ventricular, entire anterior wall,  apical segment, inferoapical and anteroseptal wall.  2. Right ventricular systolic function is moderately reduced. The right ventricular size is normal. There is normal pulmonary artery systolic pressure.  3. Left atrial size was mildly dilated.  4. The mitral valve is abnormal. Moderate mitral valve regurgitation.  5. The aortic  valve is tricuspid. Aortic valve regurgitation is trivial. Aortic valve mean gradient measures 10.3 mmHg. Aortic valve Vmax measures 2.11 m/s.  6. Aortic dilatation noted. Aneurysm of the ascending aorta, measuring 43 mm. There is mild dilatation of the aortic root measuring 42 mm.  7. The inferior vena cava is normal in size with greater than 50% respiratory variability, suggesting right atrial pressure of 3 mmHg.   Comparison(s): Changes from prior study are noted. 07/13/19: LVEF <20%.   Cardiac Cath 07/29/19 Left Main: Short, no significant disease. Left Anterior Descending: Occluded ostially. Patent LIMA-LAD. The LAD provides collaterals to the PDA territory. Ramus Intermedius: Occluded ostially. Small vessel. Patent sequential SVG to ramus and PLOM. Left Circumflex: Diffuse severe disease in the mid to distal vessel. Patent sequential SVG to ramus and PLOM. Right Coronary Artery: Native RCA not injected, known to be proximally occluded. 40% stenosis mid SVG-distal RCA. The native PDA is small, appears occluded distally. There are collaterals to the PDA area from the LAD.     Neuro/Psych negative neurological ROS  negative psych ROS   GI/Hepatic Neg liver ROS, GERD  Medicated and Controlled,  Endo/Other  diabetes  Renal/GU negative Renal ROS  negative genitourinary   Musculoskeletal  (+) Arthritis , Osteoarthritis,    Abdominal   Peds  Hematology  (+) anemia , Eliquis therapy- last dose 8/19 am   Anesthesia Other Findings   Reproductive/Obstetrics                            Anesthesia Physical Anesthesia Plan  ASA: III  Anesthesia Plan: General   Post-op Pain Management:    Induction: Intravenous  PONV Risk Score and Plan: 2 and Treatment may vary due to age or medical condition and Ondansetron  Airway  Management Planned: Oral ETT  Additional Equipment:   Intra-op Plan:   Post-operative Plan: Extubation in OR  Informed Consent: I  have reviewed the patients History and Physical, chart, labs and discussed the procedure including the risks, benefits and alternatives for the proposed anesthesia with the patient or authorized representative who has indicated his/her understanding and acceptance.     Dental advisory given  Plan Discussed with: CRNA and Anesthesiologist  Anesthesia Plan Comments:       Anesthesia Quick Evaluation

## 2019-12-06 NOTE — Anesthesia Procedure Notes (Signed)
Procedure Name: Intubation Date/Time: 12/06/2019 10:37 AM Performed by: Mariea Clonts, CRNA Pre-anesthesia Checklist: Patient identified, Emergency Drugs available, Suction available and Patient being monitored Patient Re-evaluated:Patient Re-evaluated prior to induction Oxygen Delivery Method: Circle System Utilized Preoxygenation: Pre-oxygenation with 100% oxygen Induction Type: IV induction Ventilation: Mask ventilation without difficulty Laryngoscope Size: Miller and 2 Grade View: Grade I Tube type: Oral Tube size: 7.5 mm Number of attempts: 1 Airway Equipment and Method: Stylet and Oral airway Placement Confirmation: ETT inserted through vocal cords under direct vision,  positive ETCO2 and breath sounds checked- equal and bilateral Tube secured with: Tape Dental Injury: Teeth and Oropharynx as per pre-operative assessment

## 2019-12-06 NOTE — Anesthesia Postprocedure Evaluation (Signed)
Anesthesia Post Note  Patient: Jordan Ross  Procedure(s) Performed: ATRIAL FIBRILLATION ABLATION (N/A )     Patient location during evaluation: PACU Anesthesia Type: General Level of consciousness: awake and alert and oriented Pain management: pain level controlled Vital Signs Assessment: post-procedure vital signs reviewed and stable Respiratory status: spontaneous breathing, nonlabored ventilation and respiratory function stable Cardiovascular status: blood pressure returned to baseline and stable Postop Assessment: no apparent nausea or vomiting Anesthetic complications: no   No complications documented.  Last Vitals:  Vitals:   12/06/19 1400 12/06/19 1405  BP: 113/74 109/75  Pulse: 60 61  Resp: 13 15  Temp:    SpO2: 97% (!) 89%    Last Pain:  Vitals:   12/06/19 1356  TempSrc: Temporal  PainSc:                  Delmus Warwick A.

## 2019-12-06 NOTE — Discharge Instructions (Signed)
Femoral Site Care This sheet gives you information about how to care for yourself after your procedure. Your health care provider may also give you more specific instructions. If you have problems or questions, contact your health care provider. What can I expect after the procedure? After the procedure, it is common to have:  Bruising that usually fades within 1-2 weeks.  Tenderness at the site. Follow these instructions at home: Wound care  Follow instructions from your health care provider about how to take care of your insertion site. Make sure you: ? Wash your hands with soap and water before you change your bandage (dressing). If soap and water are not available, use hand sanitizer. ? Change your dressing as told by your health care provider. ? Leave stitches (sutures), skin glue, or adhesive strips in place. These skin closures may need to stay in place for 2 weeks or longer. If adhesive strip edges start to loosen and curl up, you may trim the loose edges. Do not remove adhesive strips completely unless your health care provider tells you to do that.  Do not take baths, swim, or use a hot tub until your health care provider approves.  You may shower 24-48 hours after the procedure or as told by your health care provider. ? Gently wash the site with plain soap and water. ? Pat the area dry with a clean towel. ? Do not rub the site. This may cause bleeding.  Do not apply powder or lotion to the site. Keep the site clean and dry.  Check your femoral site every day for signs of infection. Check for: ? Redness, swelling, or pain. ? Fluid or blood. ? Warmth. ? Pus or a bad smell. Activity  For the first 2-3 days after your procedure, or as long as directed: ? Avoid climbing stairs as much as possible. ? Do not squat.  Do not lift anything that is heavier than 10 lb (4.5 kg), or the limit that you are told, until your health care provider says that it is safe.  Rest as  directed. ? Avoid sitting for a long time without moving. Get up to take short walks every 1-2 hours.  Do not drive for 24 hours if you were given a medicine to help you relax (sedative). General instructions  Take over-the-counter and prescription medicines only as told by your health care provider.  Keep all follow-up visits as told by your health care provider. This is important. Contact a health care provider if you have:  A fever or chills.  You have redness, swelling, or pain around your insertion site. Get help right away if:  The catheter insertion area swells very fast.  You pass out.  You suddenly start to sweat or your skin gets clammy.  The catheter insertion area is bleeding, and the bleeding does not stop when you hold steady pressure on the area.  The area near or just beyond the catheter insertion site becomes pale, cool, tingly, or numb. These symptoms may represent a serious problem that is an emergency. Do not wait to see if the symptoms will go away. Get medical help right away. Call your local emergency services (911 in the [place] Do not drive yourself to the hospital. Summary  After the procedure, it is common to have bruising that usually fades within 1-2 weeks.  Check your femoral site every day for signs of infection.  Do not lift anything that is heavier than 10 lb (4.5 kg), or the   limit that you are told, until your health care provider says that it is safe. This information is not intended to replace advice given to you by your health care provider. Make sure you discuss any questions you have with your health care provider. Document Revised: 04/17/2017 Document Reviewed: 04/17/2017 Elsevier Patient Education  2020 [employer]  

## 2019-12-06 NOTE — Transfer of Care (Signed)
Immediate Anesthesia Transfer of Care Note  Patient: Jordan Ross  Procedure(s) Performed: ATRIAL FIBRILLATION ABLATION (N/A )  Patient Location: PACU  Anesthesia Type:General  Level of Consciousness: awake, alert  and oriented  Airway & Oxygen Therapy: Patient Spontanous Breathing and Patient connected to nasal cannula oxygen  Post-op Assessment: Report given to RN, Post -op Vital signs reviewed and stable and Patient moving all extremities X 4  Post vital signs: Reviewed and stable  Last Vitals:  Vitals Value Taken Time  BP 113/76 12/06/19 1341  Temp 36.4 C 12/06/19 1326  Pulse 60 12/06/19 1345  Resp 14 12/06/19 1345  SpO2 98 % 12/06/19 1345  Vitals shown include unvalidated device data.  Last Pain:  Vitals:   12/06/19 1326  TempSrc: Temporal  PainSc: Asleep         Complications: No complications documented.

## 2019-12-06 NOTE — H&P (Signed)
Chief Complaint:  palpitations  History of Present Illness:    Jordan Ross is a 74 y.o. male who presents for ablation of atrial flutter and atrial fibrillation..  Since recent hospital discharge, the patient reports doing very well.   He is doing well with amiodarone. recent CT is suggestive of fibrotic lung changes.  Dr Croitoru's hope is to be able to stop amiodarone eventually. Today, he denies symptoms of palpitations, chest pain, shortness of breath,  lower extremity edema, dizziness, presyncope, or syncope.  The patient is otherwise without complaint today.         Past Medical History:  Diagnosis Date  . Abdominal aortic aneurysm ([place])   . AICD (automatic cardioverter/defibrillator) present    [employer]  . Anemia    iron deficiency anemia,takes iron pill daily  . Arthritis   . Coronary artery disease    AWMI 2001  . Diverticulosis   . GERD (gastroesophageal reflux disease)    takes Protonix daily  . History of colon polyps    benign  . History of migraine 40 yrs ago  . History of shingles   . Hyperlipidemia    takes Atorvastatin and Niacin daily  . Hypertension    takes Diovan,Spironolactone, and Carvedilol daily  . ICD (implantable cardioverter-defibrillator), single, in situ   . Ischemic cardiomyopathy   . Joint swelling   . LV dysfunction    takes Digoxin daily  . Myocardial infarction ([place]) 2001  . Pneumonia 40 yrs ago   hx of  . Shortness of breath dyspnea    with exertion.States he was a smoker for yr  . Status post coronary artery bypass grafting   . Type 2 diabetes mellitus ([facility])    takes Glipizide,Metformin,Invokana,and Lantus daily.Average fasting blood sugar runs about 140         Past Surgical History:  Procedure Laterality Date  . ABDOMINAL AORTIC ENDOVASCULAR STENT GRAFT N/A 05/22/2015   Procedure: ABDOMINAL AORTIC ENDOVASCULAR STENT GRAFT;  Surgeon: Serafina Mitchell, MD;  Location: [facility];  Service: Vascular;   Laterality: N/A;  . CARDIAC CATHETERIZATION  2001  . COLONOSCOPY N/A 03/21/2014   Procedure: COLONOSCOPY;  Surgeon: Rogene Houston, MD;  [facility];  Service: Endoscopy;  Laterality: N/A;  855 - moved to 12/4 @ 8:30 - Ann notified pt  . CORONARY ARTERY BYPASS GRAFT  2001   unsure if it was 3 or 4  . DOPPLER ECHOCARDIOGRAPHY     2011 38% EF.2013 EF 30-35%  . IR FLUORO GUIDE CV LINE RIGHT  07/26/2019  . IR REMOVAL TUN CV CATH W/O FL  09/10/2019  . IR US GUIDE VASC ACCESS RIGHT  07/26/2019  . LEFT HEART CATH AND CORS/GRAFTS ANGIOGRAPHY N/A 07/29/2019   Procedure: LEFT HEART CATH AND CORS/GRAFTS ANGIOGRAPHY;  Surgeon: Larey Dresser, MD;  Location: [facility];  Service: Cardiovascular;  Laterality: N/A;          Current Outpatient Medications  Medication Sig Dispense Refill  . albuterol (VENTOLIN HFA) 108 (90 Base) MCG/ACT inhaler Inhale 1-2 puffs into the lungs every 4 (four) hours as needed for wheezing or shortness of breath.     [name] amiodarone (PACERONE) 200 MG tablet Take 1 tablet (200 mg total) by mouth 2 (two) times daily. 60 tablet 3  . apixaban (ELIQUIS) 5 MG TABS tablet Take 1 tablet (5 mg total) by mouth 2 (two) times daily. 60 tablet 6  . atorvastatin (LIPITOR) 80 MG tablet Take 80  mg by mouth daily.    . ferrous sulfate 325 (65 FE) MG tablet Take 325 mg by mouth daily with breakfast.    . FORA LANCETS MISC Test bid as directed. E11.65. 100 each 5  . glucose blood (FORA V10 BLOOD GLUCOSE TEST) test strip Use as instructed bid. E11.65 100 each 5  . glucose blood test strip Use as instructed 100 each 2  . LANCETS ULTRA THIN MISC Test BG every morning 100 each 5  . LITETOUCH PEN NEEDLES 31G X 8 MM MISC USE AT BEDTIME AS DIRECTED. 50 each 5  . metFORMIN (GLUCOPHAGE) 500 MG tablet Take 1,000 mg by mouth 2 (two) times daily with a meal.     . Multiple Vitamin (MULTIVITAMIN WITH MINERALS) TABS tablet Take 1 tablet by mouth daily.    [name] omeprazole  (PRILOSEC OTC) 20 MG tablet Take 20 mg by mouth daily.    [name] oxymetazoline (AFRIN) 0.05 % nasal spray Place 2 sprays into both nostrils 2 (two) times daily as needed for congestion.    . sacubitril-valsartan (ENTRESTO) 49-51 MG Take 1 tablet by mouth 2 (two) times daily. 60 tablet 3  . sennosides-docusate sodium (SENOKOT-S) 8.6-50 MG tablet Take 1 tablet by mouth daily.    . insulin glargine (LANTUS SOLOSTAR) 100 UNIT/ML Solostar Pen Inject 25 Units into the skin at bedtime. 7.5 mL 0   No current facility-administered medications for this visit.    Allergies:   Patient has no known allergies.   Social History:  The patient  reports that he has quit smoking. He quit after 40.00 years of use. He has never used smokeless tobacco. He reports that he does not drink alcohol or use drugs.   ROS:  Please see the history of present illness.   All other systems are personally reviewed and negative.   Physical Exam: Vitals:   12/06/19 0818  BP: 113/76  Pulse: 66  Resp: 18  Temp: 97.6 F (36.4 C)  TempSrc: Oral  SpO2: 100%  Weight: 74.8 kg  Height: 6' (1.829 m)    GEN- The patient is chronically ill appearing, alert and oriented x 3 today.   Head- normocephalic, atraumatic Eyes-  Sclera clear, conjunctiva pink Ears- hearing intact Oropharynx- clear Neck- supple, Lungs-  normal work of breathing Heart- Regular rate and rhythm  GI- soft  Extremities- no clubbing, cyanosis, or edema,  MS- no significant deformity or atrophy Skin- no rash or lesion Psych- euthymic mood, full affect Neuro- strength and sensation are intact   Labs/Other Tests and Data Reviewed:    ASSESSMENT & PLAN:    1.  Typical atrial flutter and afib The patient has symptomatic, persistent typical appearing atrial flutter.  This is his primary arrhythmias.  He also has had atrial fibrillation. he is on amiodarone currently.  Chads2vasc score is at least 5.  he is anticoagulated with eliquis . He  reports compliance with eliquis without interruption. Cardiac CT was reviewed with him today. Risk, benefits, and alternatives to EP study and radiofrequency ablation for afib were also discussed in detail today. These risks include but are not limited to stroke, bleeding, vascular damage, tamponade, perforation, damage to the esophagus, lungs, and other structures, pulmonary vein stenosis, worsening renal function, and death. The patient understands these risk and wishes to proceed.     Thompson Grayer MD, [place] 12/06/2019 10:01 AM

## 2019-12-09 ENCOUNTER — Encounter (HOSPITAL_COMMUNITY): Payer: Self-pay | Admitting: Internal Medicine

## 2019-12-20 NOTE — Telephone Encounter (Signed)
Have we been able to schedule him for follow up appointment to review CT scan?

## 2019-12-20 NOTE — Telephone Encounter (Signed)
Pt had an appt scheduled with [facility] 8/16 in [place] but appt was cancelled. Attempted to call pt but unable to reach. Left message for him to return call.

## 2019-12-25 ENCOUNTER — Ambulatory Visit (INDEPENDENT_AMBULATORY_CARE_PROVIDER_SITE_OTHER): Payer: Medicare Other | Admitting: *Deleted

## 2019-12-25 DIAGNOSIS — I255 Ischemic cardiomyopathy: Secondary | ICD-10-CM | POA: Diagnosis not present

## 2019-12-25 LAB — CUP PACEART REMOTE DEVICE CHECK
Battery Remaining Longevity: 19 mo
Battery Remaining Percentage: 16 %
Battery Voltage: 2.74 V
Brady Statistic RV Percent Paced: 1 %
Date Time Interrogation Session: 20210908032321
HighPow Impedance: 39 Ohm
Implantable Lead Implant Date: 20101028
Implantable Lead Location: 753860
Implantable Pulse Generator Implant Date: 20101028
Lead Channel Impedance Value: 340 Ohm
Lead Channel Pacing Threshold Amplitude: 1.25 V
Lead Channel Pacing Threshold Pulse Width: 0.4 ms
Lead Channel Sensing Intrinsic Amplitude: 9.7 mV
Lead Channel Setting Pacing Amplitude: 2.5 V
Lead Channel Setting Pacing Pulse Width: 0.4 ms
Lead Channel Setting Sensing Sensitivity: 0.5 mV
Pulse Gen Serial Number: 737526

## 2019-12-27 ENCOUNTER — Telehealth: Payer: Self-pay

## 2019-12-27 ENCOUNTER — Ambulatory Visit (INDEPENDENT_AMBULATORY_CARE_PROVIDER_SITE_OTHER): Payer: Medicare Other

## 2019-12-27 DIAGNOSIS — I5022 Chronic systolic (congestive) heart failure: Secondary | ICD-10-CM

## 2019-12-27 DIAGNOSIS — Z95810 Presence of automatic (implantable) cardiac defibrillator: Secondary | ICD-10-CM

## 2019-12-27 NOTE — Progress Notes (Signed)
Remote ICD transmission.   

## 2019-12-27 NOTE — Progress Notes (Signed)
EPIC Encounter for ICM Monitoring  Patient Name: Jordan Ross is a 74 y.o. male Date: 12/27/2019 Primary Care Physican: Redmond School, MD Primary Cardiologist: Croitoru/Bensimhon Electrophysiologist: Croitoru Weight:  unknown                                                           Attempted call to patient and unable to reach.  Left detailed message per DPR regarding transmission. Transmission reviewed.    CorVue thoracic impedance normal but was suggesting possible fluid accumulation from 8/20-9/2.   Prescribed:  Furosemide 20 mg take 2 tablets (40 mg total) by mouth daily  Potassium 20 mEq take 1 tablet daily  Recommendations: Left voice mail with ICM number and encouraged to call if experiencing any fluid symptoms.  Follow-up plan: ICM clinic phone appointment on 01/27/2020.   91 day device clinic remote transmission 03/25/2020.    EP/Cardiology Office Visits: 02/24/2020 with Dr. Sallyanne Kuster.  03/23/2020 with Dr Rayann Heman follow up on ablation   Copy of ICM check sent to Dr. Sallyanne Kuster.   3 month ICM trend: 12/25/2019    1 Year ICM trend:       Rosalene Billings, RN 12/27/2019 3:02 PM

## 2019-12-27 NOTE — Progress Notes (Signed)
TY

## 2019-12-27 NOTE — Telephone Encounter (Signed)
Remote ICM transmission received.  Attempted call to patient regarding ICM remote transmission and left detailed message per DPR.  Advised to return call for any fluid symptoms or questions.  

## 2020-01-27 ENCOUNTER — Ambulatory Visit (INDEPENDENT_AMBULATORY_CARE_PROVIDER_SITE_OTHER): Payer: Medicare Other

## 2020-01-27 ENCOUNTER — Telehealth: Payer: Self-pay

## 2020-01-27 DIAGNOSIS — I5022 Chronic systolic (congestive) heart failure: Secondary | ICD-10-CM | POA: Diagnosis not present

## 2020-01-27 DIAGNOSIS — Z95810 Presence of automatic (implantable) cardiac defibrillator: Secondary | ICD-10-CM | POA: Diagnosis not present

## 2020-01-27 NOTE — Progress Notes (Signed)
EPIC Encounter for ICM Monitoring  Patient Name: Jordan Ross is a 74 y.o. male Date: 01/27/2020 Primary Care Physican: Redmond School, MD Primary Cardiologist:Croitoru/Bensimhon Electrophysiologist:Croitoru Weight: unknown   Attempted call to patient and unable to reach.  Left detailed message per DPR regarding transmission. Transmission reviewed.   CorVue thoracic impedancenormal.  Prescribed:  Furosemide20 mg take 2 tablets (40 mg total) by mouth daily  Metolazone 2.5 mg Take 1 tablet (2.5 mg total) by mouth as needed (Take only as directed by the provider).  5 tablets ordered  Labs: 12/02/2019 Creatinine 1.71, BUN 26, Potassium 4.6, Sodium 139, GFR 39-45 11/28/2019 Creatinine 1.64, BUN 27, Potassium 5.0, Sodium 142, GFR 41-48  09/10/2019 Creatinine 1.34, BUN 14, Potassium 4.5, Sodium 140, GFR 53->60  A complete set of results can be found in Results Review.  Recommendations:Left voice mail with ICM number and encouraged to call if experiencing any fluid symptoms.  Follow-up plan: ICM clinic phone appointment on11/16/2021. 91 day device clinic remote transmission 03/25/2020.   EP/Cardiology Office Visits:02/24/2020 with Dr.Croitoru.  03/23/2020 with Dr Rayann Heman follow up on ablation  Copy of ICM check sent to Dr.Croitoru.   3 month ICM trend: 01/27/2020    1 Year ICM trend:       Rosalene Billings, RN 01/27/2020 2:26 PM

## 2020-01-27 NOTE — Progress Notes (Signed)
Great!

## 2020-01-27 NOTE — Telephone Encounter (Signed)
Remote ICM transmission received.  Attempted call to patient regarding ICM remote transmission and left detailed message per DPR.  Advised to return call for any fluid symptoms or questions. Next ICM remote transmission scheduled 03/03/2020.     

## 2020-02-17 ENCOUNTER — Other Ambulatory Visit (HOSPITAL_COMMUNITY): Payer: Self-pay | Admitting: Internal Medicine

## 2020-02-20 ENCOUNTER — Other Ambulatory Visit: Payer: Self-pay | Admitting: Cardiovascular Disease

## 2020-02-24 ENCOUNTER — Other Ambulatory Visit: Payer: Self-pay

## 2020-02-24 ENCOUNTER — Encounter: Payer: Self-pay | Admitting: Cardiovascular Disease

## 2020-02-24 ENCOUNTER — Ambulatory Visit (INDEPENDENT_AMBULATORY_CARE_PROVIDER_SITE_OTHER): Payer: Medicare Other | Admitting: Cardiovascular Disease

## 2020-02-24 VITALS — BP 97/74 | HR 82 | Ht 72.0 in | Wt 173.8 lb

## 2020-02-24 DIAGNOSIS — E7800 Pure hypercholesterolemia, unspecified: Secondary | ICD-10-CM

## 2020-02-24 DIAGNOSIS — I255 Ischemic cardiomyopathy: Secondary | ICD-10-CM

## 2020-02-24 DIAGNOSIS — I5022 Chronic systolic (congestive) heart failure: Secondary | ICD-10-CM

## 2020-02-24 DIAGNOSIS — E1159 Type 2 diabetes mellitus with other circulatory complications: Secondary | ICD-10-CM

## 2020-02-24 DIAGNOSIS — I2510 Atherosclerotic heart disease of native coronary artery without angina pectoris: Secondary | ICD-10-CM

## 2020-02-24 DIAGNOSIS — I7121 Aneurysm of the ascending aorta, without rupture: Secondary | ICD-10-CM

## 2020-02-24 DIAGNOSIS — I10 Essential (primary) hypertension: Secondary | ICD-10-CM

## 2020-02-24 DIAGNOSIS — I712 Thoracic aortic aneurysm, without rupture: Secondary | ICD-10-CM

## 2020-02-24 DIAGNOSIS — J449 Chronic obstructive pulmonary disease, unspecified: Secondary | ICD-10-CM | POA: Diagnosis not present

## 2020-02-24 DIAGNOSIS — Z8679 Personal history of other diseases of the circulatory system: Secondary | ICD-10-CM

## 2020-02-24 DIAGNOSIS — Z95810 Presence of automatic (implantable) cardiac defibrillator: Secondary | ICD-10-CM

## 2020-02-24 DIAGNOSIS — Z95828 Presence of other vascular implants and grafts: Secondary | ICD-10-CM

## 2020-02-24 NOTE — Progress Notes (Signed)
Cardiology Office Note    Date:  02/25/2020   ID:  ADRIANE GUGLIELMO, DOB 1945-07-02, MRN [id number]  PCP:  Redmond School, MD  Cardiologist:  Quay Burow, M.D. Sanda Klein, MD   Chief Complaint  Patient presents with  . Congestive Heart Failure    History of Present Illness:  JACOBE STUDY is a 74 y.o. male with CAD s/p CABG, CHF, AAA s/p EVAR who had a recent protracted hospitalization for acute on chronic heart failure exacerbation with low cardiac output in the setting of persistent typical atrial flutter and atrial fibrillation in early 2021.  He required intravenous inotrope support with milrinone for several weeks but has since weaned off.  He is a Actor.   He had an cavotricuspid isthmus ablation for atrial flutter as well as atrial fibrillation ablation procedure in August 20 and is no longer taking amiodarone.  He has a single-chamber defibrillator so this cannot provide detailed proof against recurrence of atrial fibrillation, but his ventricular rates have been in normal range and fairly steady.  He reports doing quite well.  He has not had issues with orthopnea, PND or edema and has not had to take any of his "as needed" metolazone.  He remains off beta-blocker since he had issues with low cardiac output.  He is on the maximum dose of Entresto.  He denies dizziness or syncope although his blood pressure remains relatively low.  He has not had problems with palpitations.  Interrogation of his device shows no need for ventricular pacing and no episodes of ventricular tachycardia or ventricular fibrillation.  Generator and lead parameters are normal.  His c thoracic impedance has been very stable over the last 3 months, consistent with euvolemia.  He denies any falls, injuries or bleeding problems and is compliant with Eliquis.  He has gained about 4-5 pounds since his last appointment, but is also wearing heavier clothes due to the cold weather.  His home scale  typically shows about 6 pounds less than our office scale.   He presented with an anterior wall myocardial infarction in 2001 treated with coronary artery bypass grafting by Dr. Roxy Manns (LIMA to his LAD, SVGs to a ramus branch, circumflex and PDA). His EF was in the 35% range. He underwent ICD implantation (single-chamber St. Jude Fortify) for primary prevention October 2010. He has not received either appropriate or inappropriate shocks.  During hospitalization for heart failure exacerbation due to persistent atrial fibrillation in March 2021 EF was less than 20%.  The LV was also moderately dilated with an end diastolic diameter of 6.5 cm, but repeat echocardiogram in May 2021 showed returned to baseline EF of 30-35% and slight reduction left ventricular end-diastolic diameter at 6.1 cm.  Coronary angiography in April 2021 showed occluded native coronaries with patent LIMA to LAD and patent sequential SVG to ramus and OM, left to right collaterals to PDA.  His other problems include a known abdominal aortic aneurysm s/p EVAR 05/2015, bilateral renal artery stenosis, hypertension, hyperlipidemia and non-insulin-requiring diabetes. He has chronic shortness of breath, likely related to a history of tobacco abuse, having smoked 40-pack-years, quit 2001 at the time of his open heart surgery. The stress Myoview performed 04/07/11 showed anterior scar without ischemia.  Past Medical History:  Diagnosis Date  . Abdominal aortic aneurysm ([place])   . AICD (automatic cardioverter/defibrillator) present    St Jude  . Anemia    iron deficiency anemia,takes iron pill daily  . Arthritis   .  Coronary artery disease    AWMI 2001  . Diverticulosis   . GERD (gastroesophageal reflux disease)    takes Protonix daily  . History of colon polyps    benign  . History of migraine 40 yrs ago  . History of shingles   . Hyperlipidemia    takes Atorvastatin and Niacin daily  . Hypertension    takes Diovan,Spironolactone,  and Carvedilol daily  . ICD (implantable cardioverter-defibrillator), single, in situ   . Ischemic cardiomyopathy   . Joint swelling   . LV dysfunction    takes Digoxin daily  . Myocardial infarction ([place]) 2001  . Pneumonia 40 yrs ago   hx of  . Shortness of breath dyspnea    with exertion.States he was a smoker for yr  . Status post coronary artery bypass grafting   . Type 2 diabetes mellitus (HCC)    takes Glipizide,Metformin,Invokana,and Lantus daily.Average fasting blood sugar runs about 140    Past Surgical History:  Procedure Laterality Date  . ABDOMINAL AORTIC ENDOVASCULAR STENT GRAFT N/A 05/22/2015   Procedure: ABDOMINAL AORTIC ENDOVASCULAR STENT GRAFT;  Surgeon: Serafina Mitchell, MD;  Location: [facility];  Service: Vascular;  Laterality: N/A;  . ATRIAL FIBRILLATION ABLATION N/A 12/06/2019   Procedure: ATRIAL FIBRILLATION ABLATION;  Surgeon: Thompson Grayer, MD;  Location: [facility];  Service: Cardiovascular;  Laterality: N/A;  . CARDIAC CATHETERIZATION  2001  . COLONOSCOPY N/A 03/21/2014   Procedure: COLONOSCOPY;  Surgeon: Rogene Houston, MD;  [facility];  Service: Endoscopy;  Laterality: N/A;  855 - moved to 12/4 @ 8:30 - Ann notified pt  . CORONARY ARTERY BYPASS GRAFT  2001   unsure if it was 3 or 4  . DOPPLER ECHOCARDIOGRAPHY     2011 38% EF.2013 EF 30-35%  . IR FLUORO GUIDE CV LINE RIGHT  07/26/2019  . IR REMOVAL TUN CV CATH W/O FL  09/10/2019  . IR US GUIDE VASC ACCESS RIGHT  07/26/2019  . LEFT HEART CATH AND CORS/GRAFTS ANGIOGRAPHY N/A 07/29/2019   Procedure: LEFT HEART CATH AND CORS/GRAFTS ANGIOGRAPHY;  Surgeon: Larey Dresser, MD;  Location: [facility];  Service: Cardiovascular;  Laterality: N/A;    Current Medications: Outpatient Medications Prior to Visit  Medication Sig Dispense Refill  . albuterol (VENTOLIN HFA) 108 (90 Base) MCG/ACT inhaler Inhale 1-2 puffs into the lungs every 4 (four) hours as needed for wheezing or shortness of breath.      [name] apixaban (ELIQUIS) 5 MG TABS tablet Take 1 tablet (5 mg total) by mouth 2 (two) times daily. 60 tablet 6  . atorvastatin (LIPITOR) 80 MG tablet Take 80 mg by mouth daily.    . ferrous sulfate 325 (65 FE) MG tablet Take 325 mg by mouth daily with breakfast.    . FORA LANCETS MISC Test bid as directed. E11.65. 100 each 5  . glucose blood (FORA V10 BLOOD GLUCOSE TEST) test strip Use as instructed bid. E11.65 100 each 5  . glucose blood test strip Use as instructed 100 each 2  . insulin glargine (LANTUS) 100 UNIT/ML injection Inject 25 Units into the skin at bedtime as needed (High blood sugar).    [name] LANCETS ULTRA THIN MISC Test BG every morning 100 each 5  . LITETOUCH PEN NEEDLES 31G X 8 MM MISC USE AT BEDTIME AS DIRECTED. 50 each 5  . metFORMIN (GLUCOPHAGE) 500 MG tablet Take 1,000 mg by mouth 2 (two) times daily with a meal.     .  metoprolol tartrate (LOPRESSOR) 50 MG tablet Take one tablet by mouth 2 hours prior to cardiac CT 1 tablet 0  . Multiple Vitamin (MULTIVITAMIN WITH MINERALS) TABS tablet Take 1 tablet by mouth daily.    [name] omeprazole (PRILOSEC OTC) 20 MG tablet Take 20 mg by mouth daily.    [name] oxymetazoline (AFRIN) 0.05 % nasal spray Place 2 sprays into both nostrils 2 (two) times daily as needed for congestion.    . sacubitril-valsartan (ENTRESTO) 97-103 MG Take 1 tablet by mouth 2 (two) times daily. 60 tablet 11  . sennosides-docusate sodium (SENOKOT-S) 8.6-50 MG tablet Take 1 tablet by mouth daily.    . furosemide (LASIX) 20 MG tablet Take 2 tablets (40 mg total) by mouth daily. 180 tablet 0  . metolazone (ZAROXOLYN) 2.5 MG tablet Take 1 tablet (2.5 mg total) by mouth as needed (Take only as directed by  the provider). 5 tablet 0   No facility-administered medications prior to visit.     Allergies:   Patient has no known allergies.   Social History   Socioeconomic History  . Marital status: Widowed    Spouse name: Not on file  . Number of children: Not on file  . Years of  education: Not on file  . Highest education level: Not on file  Occupational History  . Not on file  Tobacco Use  . Smoking status: Former Smoker    Packs/day: 0.50    Years: 40.00    Pack years: 20.00    Quit date: 04/19/1999    Years since quitting: 20.8  . Smokeless tobacco: Never Used  Vaping Use  . Vaping Use: Never used  Substance and Sexual Activity  . Alcohol use: No  . Drug use: No  . Sexual activity: Yes  Other Topics Concern  . Not on file  Social History Narrative  . Not on file   Social Determinants of Health   Financial Resource Strain:   . Difficulty of Paying Living Expenses: Not on file  Food Insecurity:   . Worried About Charity fundraiser in the Last Year: Not on file  . Ran Out of Food in the Last Year: Not on file  Transportation Needs:   . Lack of Transportation (Medical): Not on file  . Lack of Transportation (Non-Medical): Not on file  Physical Activity:   . Days of Exercise per Week: Not on file  . Minutes of Exercise per Session: Not on file  Stress:   . Feeling of Stress : Not on file  Social Connections:   . Frequency of Communication with Friends and Family: Not on file  . Frequency of Social Gatherings with Friends and Family: Not on file  . Attends Religious Services: Not on file  . Active Member of Clubs or Organizations: Not on file  . Attends Archivist Meetings: Not on file  . Marital Status: Not on file     Family History:  The patient's family history includes COPD in his mother; Heart disease in his brother.   ROS:   Please see the history of present illness.    ROS All other systems are reviewed and are negative.  PHYSICAL EXAM:   VS:  BP 97/74   Pulse 82   Ht 6' (1.829 m)   Wt 173 lb 12.8 oz (78.8 kg)   SpO2 99%   BMI 23.57 kg/m      General: Alert, oriented x3, no distress, healthy left subclavian pacemaker site. Head: no  evidence of trauma, PERRL, EOMI, no exophtalmos or lid lag, no myxedema, no  xanthelasma; normal ears, nose and oropharynx Neck: normal jugular venous pulsations and no hepatojugular reflux; brisk carotid pulses without delay and no carotid bruits Chest: clear to auscultation, no signs of consolidation by percussion or palpation, normal fremitus, symmetrical and full respiratory excursions Cardiovascular: normal position and quality of the apical impulse, regular rhythm, normal first and second heart sounds, no murmurs, rubs or gallops Abdomen: no tenderness or distention, no masses by palpation, no abnormal pulsatility or arterial bruits, normal bowel sounds, no hepatosplenomegaly Extremities: no clubbing, cyanosis or edema; 2+ radial, ulnar and brachial pulses bilaterally; 2+ right femoral, posterior tibial and dorsalis pedis pulses; 2+ left femoral, posterior tibial and dorsalis pedis pulses; no subclavian or femoral bruits Neurological: grossly nonfocal Psych: Normal mood and affect    Wt Readings from Last 3 Encounters:  02/24/20 173 lb 12.8 oz (78.8 kg)  12/06/19 165 lb (74.8 kg)  11/25/19 169 lb 6.4 oz (76.8 kg)    Studies/Labs Reviewed:  PFTs from 11/05/2019 EKG:  EKG is ordered today.  It shows sinus rhythm with PACs and a nonspecific intraventricular conduction delay with a QRS duration of 140 ms.  QTC 493 ms.  Recent Labs: 07/13/2019: TSH 1.873 07/29/2019: Magnesium 1.8 09/10/2019: B Natriuretic Peptide 1,542.0 12/06/2019: Hemoglobin 10.3; Platelets 218 02/24/2020: ALT 19; BUN 16; Creatinine, Ser 1.64; Potassium 6.0; Sodium 142   Lipid Panel    Component Value Date/Time   CHOL 126 12/23/2016 0943   TRIG 136 12/23/2016 0943   HDL 41 12/23/2016 0943   CHOLHDL 3.1 12/23/2016 0943   CHOLHDL 7.8 (H) 06/14/2016 1458   VLDL NOT CALC 06/14/2016 1458   LDLCALC 58 12/23/2016 0943    ASSESSMENT:    1. Chronic systolic heart failure ([place])   2. History of atrial fibrillation   3. Chronic obstructive pulmonary disease, unspecified COPD type ([place])   4.  Coronary artery disease involving native coronary artery of native heart without angina pectoris   5. ICD (implantable cardioverter-defibrillator), single, in situ   6. History of endovascular stent graft for abdominal aortic aneurysm (AAA)   7. Ascending aortic aneurysm ([place])   8. Essential hypertension   9. Type 2 diabetes mellitus with vascular disease ([place])   10. Hypercholesterolemia     PLAN:  In order of problems listed above:  1. CHF: He is recovered really well from his severe decompensation with shocklike hemodynamic parameters in March, in the setting of persistent atrial fibrillation with RVR.  He has had subsequent excellent recovery suggest that he had a component of tachycardia related cardiomyopathy, superimposed on pre-existing ischemic ventricular dysfunction.  He is on maximum dose Entresto.  We could consider restarting his beta-blocker.   He has a small supply of metolazone 2.5 mg tablets ordered to use only as instructed for abrupt weight gain, but has not needed it. 2. Atrial flutter/fibrillation: No apparent recurrence since in his ablation procedure in October. 3. Obstructive/restrictive lung disease: Off amiodarone now.  Seeing Dr. Halford Chessman. 4. CAD s/p CABG: Asymptomatic/no angina..  Graft dependent.  Recent coronary angiography shows patent LIMA to LAD and SVG to ramus-OM with chronic occlusion of all 3 major coronary arteries and collateral filling of the distal branches of the RCA. 5. ICD: Normal device function.  He has not received VT therapy.  Consider upgrade to dual-chamber CRT-D device (but benefit would be questionable since he he has primarily ischemic cardiomyopathy, he does not have a typical  LBBB and his QRS is now shortened to 140 ms).  Since he is clinically better, will defer that decision until any future deterioration. 6. AAA s/p EVAR: followed by Dr. Trula Slade.  Last AAA ultrasound November 2020, is largely unchanged from the one performed a year earlier  (maximum diameter 4.3 cm, no evidence of endoleak).   7.  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  Bovine arch.  I do not know whether it would serve any purpose to follow this with routine CTs annually, since I do not think he be a good candidate for repeat sternotomy even if we found a large aneurysm. 8. HTN: Now with borderline low blood pressure on maximum dose Entresto. 9. HLP: Due for recheck with his PCP in December.  Target LDL less than 70 10. DM: Shoot for target hemoglobin A1c 6-7%.  Control is recently improving, but not yet ideal.   Medication Adjustments/Labs and Tests Ordered: Current medicines are reviewed at length with the patient today.  Concerns regarding medicines are outlined above.  Medication changes, Labs and Tests ordered today are listed in the Patient Instructions below. Patient Instructions  Medication Instructions:  No changes *If you need a refill on your cardiac medications before your next appointment, please call your pharmacy*   Lab Work: Your provider would like for you to have the following labs today: CMET and A1C  If you have labs (blood work) drawn today and your tests are completely normal, you will receive your results only by: [name] MyChart Message (if you have MyChart) OR . A paper copy in the mail If you have any lab test that is abnormal or we need to change your treatment, we will call you to review the results.   Testing/Procedures: None ordered   Follow-Up: At [facility], you and your health needs are our priority.  As part of our continuing mission to provide you with exceptional heart care, we have created designated Provider Care Teams.  These Care Teams include your primary Cardiologist (physician) and Advanced Practice Providers (APPs -  Physician Assistants and Nurse Practitioners) who all work  together to provide you with the care you need, when you need it.  We recommend signing up for the patient portal called "MyChart".  Sign up information is provided on this After Visit Summary.  MyChart is used to connect with patients for Virtual Visits (Telemedicine).  Patients are able to view lab/test results, encounter notes, upcoming appointments, etc.  Non-urgent messages can be sent to your provider as well.   To learn more about what you can do with MyChart, go to [email or web address].    Your next appointment:   3 month(s)  The format for your next appointment:   In Person  Provider:   Sanda Klein, MD       Signed, Sanda Klein, MD  02/25/2020 6:36 PM    [address], [place], [place]  [address] Phone: [phone]; Fax: [phone]

## 2020-02-24 NOTE — Patient Instructions (Signed)
Medication Instructions:  No changes *If you need a refill on your cardiac medications before your next appointment, please call your pharmacy*   Lab Work: Your provider would like for you to have the following labs today: CMET and A1C  If you have labs (blood work) drawn today and your tests are completely normal, you will receive your results only by: [name] MyChart Message (if you have MyChart) OR . A paper copy in the mail If you have any lab test that is abnormal or we need to change your treatment, we will call you to review the results.   Testing/Procedures: None ordered   Follow-Up: At [facility], you and your health needs are our priority.  As part of our continuing mission to provide you with exceptional heart care, we have created designated Provider Care Teams.  These Care Teams include your primary Cardiologist (physician) and Advanced Practice Providers (APPs -  Physician Assistants and Nurse Practitioners) who all work together to provide you with the care you need, when you need it.  We recommend signing up for the patient portal called "MyChart".  Sign up information is provided on this After Visit Summary.  MyChart is used to connect with patients for Virtual Visits (Telemedicine).  Patients are able to view lab/test results, encounter notes, upcoming appointments, etc.  Non-urgent messages can be sent to your provider as well.   To learn more about what you can do with MyChart, go to [email or web address].    Your next appointment:   3 month(s)  The format for your next appointment:   In Person  Provider:   Sanda Klein, MD

## 2020-02-25 ENCOUNTER — Telehealth: Payer: Self-pay

## 2020-02-25 ENCOUNTER — Encounter: Payer: Self-pay | Admitting: Cardiovascular Disease

## 2020-02-25 DIAGNOSIS — E875 Hyperkalemia: Secondary | ICD-10-CM

## 2020-02-25 DIAGNOSIS — I5022 Chronic systolic (congestive) heart failure: Secondary | ICD-10-CM

## 2020-02-25 LAB — COMPREHENSIVE METABOLIC PANEL
ALT: 19 IU/L (ref 0–44)
AST: 31 IU/L (ref 0–40)
Albumin/Globulin Ratio: 1.6 (ref 1.2–2.2)
Albumin: 4.2 g/dL (ref 3.7–4.7)
Alkaline Phosphatase: 99 IU/L (ref 44–121)
BUN/Creatinine Ratio: 10 (ref 10–24)
BUN: 16 mg/dL (ref 8–27)
Bilirubin Total: 0.8 mg/dL (ref 0.0–1.2)
CO2: 23 mmol/L (ref 20–29)
Calcium: 10.4 mg/dL — ABNORMAL HIGH (ref 8.6–10.2)
Chloride: 105 mmol/L (ref 96–106)
Creatinine, Ser: 1.64 mg/dL — ABNORMAL HIGH (ref 0.76–1.27)
GFR calc Af Amer: 48 mL/min/{1.73_m2} — ABNORMAL LOW (ref >59)
GFR calc non Af Amer: 41 mL/min/{1.73_m2} — ABNORMAL LOW (ref >59)
Globulin, Total: 2.6 g/dL (ref 1.5–4.5)
Glucose: 148 mg/dL — ABNORMAL HIGH (ref 65–99)
Potassium: 6 mmol/L (ref 3.5–5.2)
Sodium: 142 mmol/L (ref 134–144)
Total Protein: 6.8 g/dL (ref 6.0–8.5)

## 2020-02-25 LAB — HEMOGLOBIN A1C
Est. average glucose Bld gHb Est-mCnc: 174 mg/dL
Hgb A1c MFr Bld: 7.7 % — ABNORMAL HIGH (ref 4.8–5.6)

## 2020-02-25 NOTE — Telephone Encounter (Signed)
Patient made aware of results and verbalized understanding.  He will go to a [facility] closer to his house either today or tomorrow. He stated that he has been taking an over the counter potassium supplement but has been out the last two weeks. He has been advised to not take any and to watch his potassium intake until the labs are rechecked.

## 2020-02-25 NOTE — Telephone Encounter (Signed)
Critical results potassium 6.0 from [facility] drawn collection date 02/24/20

## 2020-02-25 NOTE — Telephone Encounter (Signed)
Left a message for the patient to call back.  Potassium is high, but may be spurious. Please recheck BMET at a lab closer to his home, such as in [place]. Please confirm he is not taking potassium supplements.  Kidney function similar to 2 months ago.  Glucose control has improved, best it has been in last 4 years, but still not quite at goal (prefer A1c<7%).

## 2020-02-28 ENCOUNTER — Telehealth: Payer: Self-pay | Admitting: *Deleted

## 2020-02-28 DIAGNOSIS — I5022 Chronic systolic (congestive) heart failure: Secondary | ICD-10-CM

## 2020-02-28 DIAGNOSIS — E875 Hyperkalemia: Secondary | ICD-10-CM

## 2020-02-28 LAB — BASIC METABOLIC PANEL
BUN/Creatinine Ratio: 14 (ref 10–24)
BUN: 22 mg/dL (ref 8–27)
CO2: 26 mmol/L (ref 20–29)
Calcium: 9.9 mg/dL (ref 8.6–10.2)
Chloride: 102 mmol/L (ref 96–106)
Creatinine, Ser: 1.6 mg/dL — ABNORMAL HIGH (ref 0.76–1.27)
GFR calc Af Amer: 49 mL/min/{1.73_m2} — ABNORMAL LOW (ref >59)
GFR calc non Af Amer: 42 mL/min/{1.73_m2} — ABNORMAL LOW (ref >59)
Glucose: 140 mg/dL — ABNORMAL HIGH (ref 65–99)
Potassium: 5.4 mmol/L — ABNORMAL HIGH (ref 3.5–5.2)
Sodium: 144 mmol/L (ref 134–144)

## 2020-02-28 MED ORDER — FUROSEMIDE 20 MG PO TABS
60.0000 mg | ORAL_TABLET | Freq: Every day | ORAL | 0 refills | Status: AC
Start: 2020-02-28 — End: 2020-05-28

## 2020-02-28 NOTE — Telephone Encounter (Signed)
Patient made aware of results and verbalized understanding.  Lab orders placed and Furosemide 60 mg once daily has been sent in for him since he was running low.

## 2020-02-28 NOTE — Telephone Encounter (Signed)
-----   Message from Sanda Klein, MD sent at 02/28/2020 11:34 AM EST ----- Kidney function is unchanged and the potassium is still slightly high. Please take an extra 20 mg of furosemide daily (that is increased to 60 mg daily) and recheck a basic metabolic panel in 2 weeks.

## 2020-03-03 ENCOUNTER — Ambulatory Visit (INDEPENDENT_AMBULATORY_CARE_PROVIDER_SITE_OTHER): Payer: Medicare Other

## 2020-03-03 DIAGNOSIS — Z95810 Presence of automatic (implantable) cardiac defibrillator: Secondary | ICD-10-CM

## 2020-03-03 DIAGNOSIS — I5022 Chronic systolic (congestive) heart failure: Secondary | ICD-10-CM | POA: Diagnosis not present

## 2020-03-06 NOTE — Progress Notes (Signed)
EPIC Encounter for ICM Monitoring  Patient Name: Jordan Ross is a 74 y.o. male Date: 03/06/2020 Primary Care Physican: Redmond School, MD Primary Cardiologist:Croitoru/Bensimhon Electrophysiologist:Croitoru Weight: unknown (does not weigh at home)   Spoke with patient and reports feeling well at this time.  Denies fluid symptoms.    CorVue thoracic impedancenormal.  Prescribed:  Furosemide20 mg take 3 tablets (60 mg total) by mouth daily  Metolazone 2.5 mg Take 1 tablet (2.5 mg total) by mouth as needed (Take only as directed by the provider).  5 tablets ordered  Labs: 02/27/2020 Creatinine 1.60, BUN 22, Potassium 5.4, Sodium 144, GFR 42-49 02/24/2020 Creatinine 1.64, BUN 16, Potassium 6.0, Sodium 142, GFR 41-48 12/02/2019 Creatinine 1.71, BUN 26, Potassium 4.6, Sodium 139, GFR 39-45 11/28/2019 Creatinine 1.64, BUN 27, Potassium 5.0, Sodium 142, GFR 41-48  09/10/2019 Creatinine 1.34, BUN 14, Potassium 4.5, Sodium 140, GFR 53->60  A complete set of results can be found in Results Review.  Recommendations:No changes and encouraged to call if experiencing any fluid symptoms.  Follow-up plan: ICM clinic phone appointment on12/20/2021. 91 day device clinic remote transmission12/11/2019.   EP/Cardiology Office Visits:03/31/2021 with Dr.Croitoru.03/23/2020 with Dr Rayann Heman follow up on ablation  Copy of ICM check sent to Dr.Croitoru.  3 month ICM trend: 03/03/2020    1 Year ICM trend:       Rosalene Billings, RN 03/06/2020 10:16 AM

## 2020-03-06 NOTE — Progress Notes (Signed)
Great.  Thanks

## 2020-03-09 ENCOUNTER — Encounter: Payer: Self-pay | Admitting: *Deleted

## 2020-03-23 ENCOUNTER — Encounter: Payer: Medicare Other | Admitting: Internal Medicine

## 2020-03-25 ENCOUNTER — Ambulatory Visit (INDEPENDENT_AMBULATORY_CARE_PROVIDER_SITE_OTHER): Payer: Medicare Other

## 2020-03-25 DIAGNOSIS — I255 Ischemic cardiomyopathy: Secondary | ICD-10-CM | POA: Diagnosis not present

## 2020-03-27 LAB — CUP PACEART REMOTE DEVICE CHECK
Battery Remaining Longevity: 16 mo
Battery Remaining Percentage: 13 %
Battery Voltage: 2.71 V
Brady Statistic RV Percent Paced: 1 %
Date Time Interrogation Session: 20211209205705
HighPow Impedance: 43 Ohm
Implantable Lead Implant Date: 20101028
Implantable Lead Location: 753860
Implantable Pulse Generator Implant Date: 20101028
Lead Channel Impedance Value: 360 Ohm
Lead Channel Pacing Threshold Amplitude: 1.25 V
Lead Channel Pacing Threshold Pulse Width: 0.4 ms
Lead Channel Sensing Intrinsic Amplitude: 6.7 mV
Lead Channel Setting Pacing Amplitude: 2.5 V
Lead Channel Setting Pacing Pulse Width: 0.4 ms
Lead Channel Setting Sensing Sensitivity: 0.5 mV
Pulse Gen Serial Number: 737526

## 2020-04-06 ENCOUNTER — Ambulatory Visit (INDEPENDENT_AMBULATORY_CARE_PROVIDER_SITE_OTHER): Payer: Medicare Other

## 2020-04-06 DIAGNOSIS — I5022 Chronic systolic (congestive) heart failure: Secondary | ICD-10-CM

## 2020-04-06 DIAGNOSIS — Z95810 Presence of automatic (implantable) cardiac defibrillator: Secondary | ICD-10-CM | POA: Diagnosis not present

## 2020-04-06 NOTE — Progress Notes (Signed)
Remote ICD transmission.   

## 2020-04-07 ENCOUNTER — Telehealth: Payer: Self-pay

## 2020-04-07 NOTE — Progress Notes (Signed)
EPIC Encounter for ICM Monitoring  Patient Name: Jordan Ross is a 74 y.o. male Date: 04/07/2020 Primary Care Physican: Redmond School, MD Primary Cardiologist:Croitoru/Bensimhon Electrophysiologist:Croitoru Weight: unknown (does not weigh at home)   Attempted call to patient and unable to reach.  Left detailed message per DPR regarding transmission. Transmission reviewed.   CorVue thoracic impedancenormal.  Prescribed:  Furosemide20 mg take 3 tablets (60 mg total) by mouth daily  Metolazone 2.5 mgTake 1 tablet (2.5 mg total) by mouth as needed (Take only as directed by the provider).5 tablets ordered  Labs: 02/27/2020 Creatinine 1.60, BUN 22, Potassium 5.4, Sodium 144, GFR 42-49 02/24/2020 Creatinine 1.64, BUN 16, Potassium 6.0, Sodium 142, GFR 41-48 12/02/2019 Creatinine1.71, BUN26, Potassium4.6, Sodium139, [id number] 11/28/2019 Creatinine1.64, BUN27, Potassium5.0, [id number], QTM22-63  09/10/2019 Creatinine1.34, BUN14, Potassium4.5, Sodium140, GFR53->60 A complete set of results can be found in Results Review.  Recommendations:Left voice mail with ICM number and encouraged to call if experiencing any fluid symptoms.  Follow-up plan: ICM clinic phone appointment on2/05/2020. 91 day device clinic remote transmission3/12/2020.   EP/Cardiology Office Visits:06/01/2020 with Dr.Croitoru.04/20/2020 with Dr Rayann Heman follow up on ablation  Copy of ICM check sent to Dr.Croitoru.   3 month ICM trend: 04/06/2020    1 Year ICM trend:       Rosalene Billings, RN 04/07/2020 8:20 AM

## 2020-04-07 NOTE — Telephone Encounter (Signed)
Remote ICM transmission received.  Attempted call to patient regarding ICM remote transmission and left detailed message per DPR.  Advised to return call for any fluid symptoms or questions.  

## 2020-04-08 NOTE — Progress Notes (Signed)
TY

## 2020-04-20 ENCOUNTER — Encounter: Payer: Medicare Other | Admitting: Internal Medicine

## 2020-04-22 ENCOUNTER — Encounter: Payer: Self-pay | Admitting: *Deleted

## 2020-04-29 DIAGNOSIS — E162 Hypoglycemia, unspecified: Secondary | ICD-10-CM | POA: Diagnosis not present

## 2020-04-29 DIAGNOSIS — J9811 Atelectasis: Secondary | ICD-10-CM | POA: Diagnosis not present

## 2020-04-29 DIAGNOSIS — T68XXXA Hypothermia, initial encounter: Secondary | ICD-10-CM | POA: Diagnosis not present

## 2020-04-29 DIAGNOSIS — A419 Sepsis, unspecified organism: Secondary | ICD-10-CM | POA: Diagnosis not present

## 2020-04-29 DIAGNOSIS — R918 Other nonspecific abnormal finding of lung field: Secondary | ICD-10-CM | POA: Diagnosis not present

## 2020-04-29 DIAGNOSIS — I959 Hypotension, unspecified: Secondary | ICD-10-CM | POA: Diagnosis not present

## 2020-04-29 DIAGNOSIS — E875 Hyperkalemia: Secondary | ICD-10-CM | POA: Diagnosis not present

## 2020-04-29 DIAGNOSIS — E11649 Type 2 diabetes mellitus with hypoglycemia without coma: Secondary | ICD-10-CM | POA: Diagnosis not present

## 2020-04-29 DIAGNOSIS — Z20822 Contact with and (suspected) exposure to covid-19: Secondary | ICD-10-CM | POA: Diagnosis not present

## 2020-04-29 DIAGNOSIS — I499 Cardiac arrhythmia, unspecified: Secondary | ICD-10-CM | POA: Diagnosis not present

## 2020-04-29 DIAGNOSIS — R0902 Hypoxemia: Secondary | ICD-10-CM | POA: Diagnosis not present

## 2020-04-29 DIAGNOSIS — K8020 Calculus of gallbladder without cholecystitis without obstruction: Secondary | ICD-10-CM | POA: Diagnosis not present

## 2020-04-29 DIAGNOSIS — R4182 Altered mental status, unspecified: Secondary | ICD-10-CM | POA: Diagnosis not present

## 2020-04-29 DIAGNOSIS — M4856XA Collapsed vertebra, not elsewhere classified, lumbar region, initial encounter for fracture: Secondary | ICD-10-CM | POA: Diagnosis not present

## 2020-04-29 DIAGNOSIS — R109 Unspecified abdominal pain: Secondary | ICD-10-CM | POA: Diagnosis not present

## 2020-04-29 DIAGNOSIS — I517 Cardiomegaly: Secondary | ICD-10-CM | POA: Diagnosis not present

## 2020-04-29 DIAGNOSIS — E872 Acidosis: Secondary | ICD-10-CM | POA: Diagnosis not present

## 2020-04-29 DIAGNOSIS — K529 Noninfective gastroenteritis and colitis, unspecified: Secondary | ICD-10-CM | POA: Diagnosis not present

## 2020-04-29 DIAGNOSIS — N179 Acute kidney failure, unspecified: Secondary | ICD-10-CM | POA: Diagnosis not present

## 2020-04-29 DIAGNOSIS — R410 Disorientation, unspecified: Secondary | ICD-10-CM | POA: Diagnosis not present

## 2020-04-29 DIAGNOSIS — K6389 Other specified diseases of intestine: Secondary | ICD-10-CM | POA: Diagnosis not present

## 2020-04-29 DIAGNOSIS — Z794 Long term (current) use of insulin: Secondary | ICD-10-CM | POA: Diagnosis not present

## 2020-04-29 DIAGNOSIS — E161 Other hypoglycemia: Secondary | ICD-10-CM | POA: Diagnosis not present

## 2020-04-29 DIAGNOSIS — R7989 Other specified abnormal findings of blood chemistry: Secondary | ICD-10-CM | POA: Diagnosis not present

## 2020-04-30 DIAGNOSIS — I517 Cardiomegaly: Secondary | ICD-10-CM | POA: Diagnosis not present

## 2020-04-30 DIAGNOSIS — J9811 Atelectasis: Secondary | ICD-10-CM | POA: Diagnosis not present

## 2020-05-01 DIAGNOSIS — R109 Unspecified abdominal pain: Secondary | ICD-10-CM | POA: Diagnosis not present

## 2020-05-11 ENCOUNTER — Encounter: Payer: Medicare HMO | Admitting: Internal Medicine

## 2020-05-19 DEATH — deceased

## 2020-06-01 ENCOUNTER — Encounter: Payer: Medicare HMO | Admitting: Cardiovascular Disease

## 2021-09-25 IMAGING — DX DG CHEST 2V
2 series · 2 of 2 positions shown · non-contrast
Comparison: 05/07/2019, 03/19/2018

CLINICAL DATA: Dyspnea

EXAM:
CHEST - 2 VIEW

[chest pa]
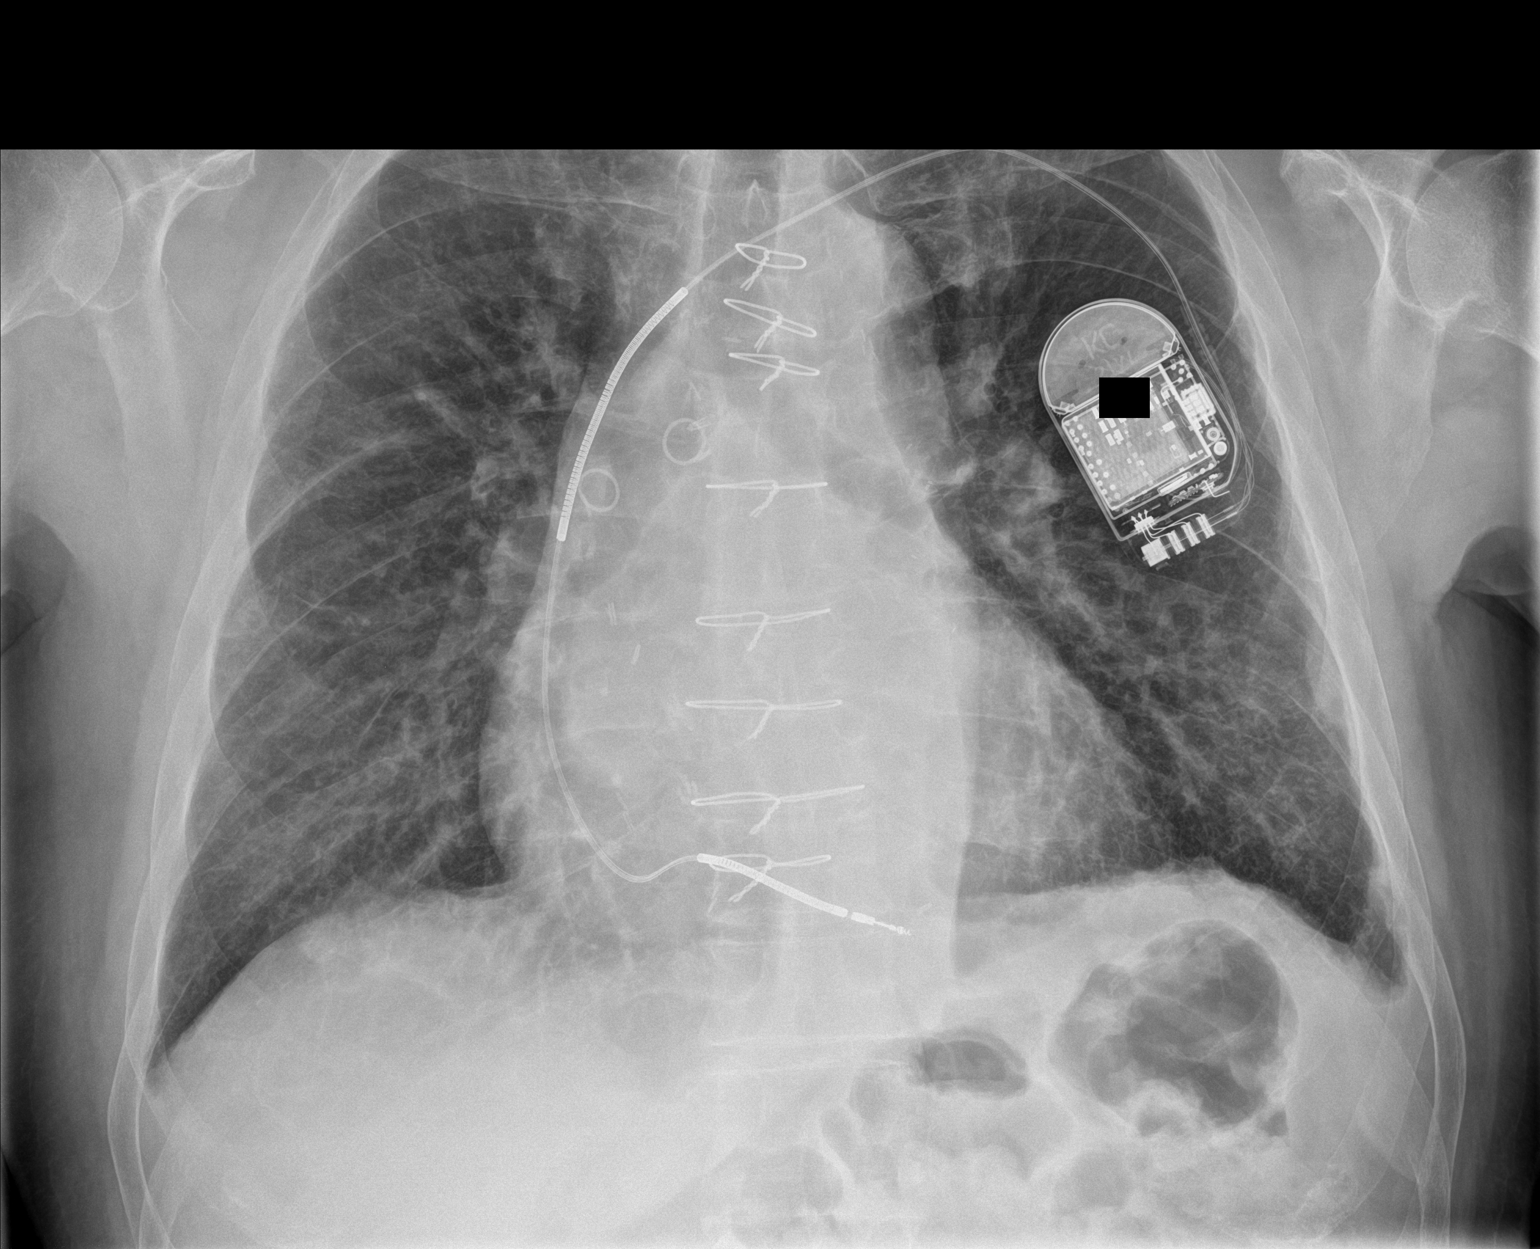

[chest lat]
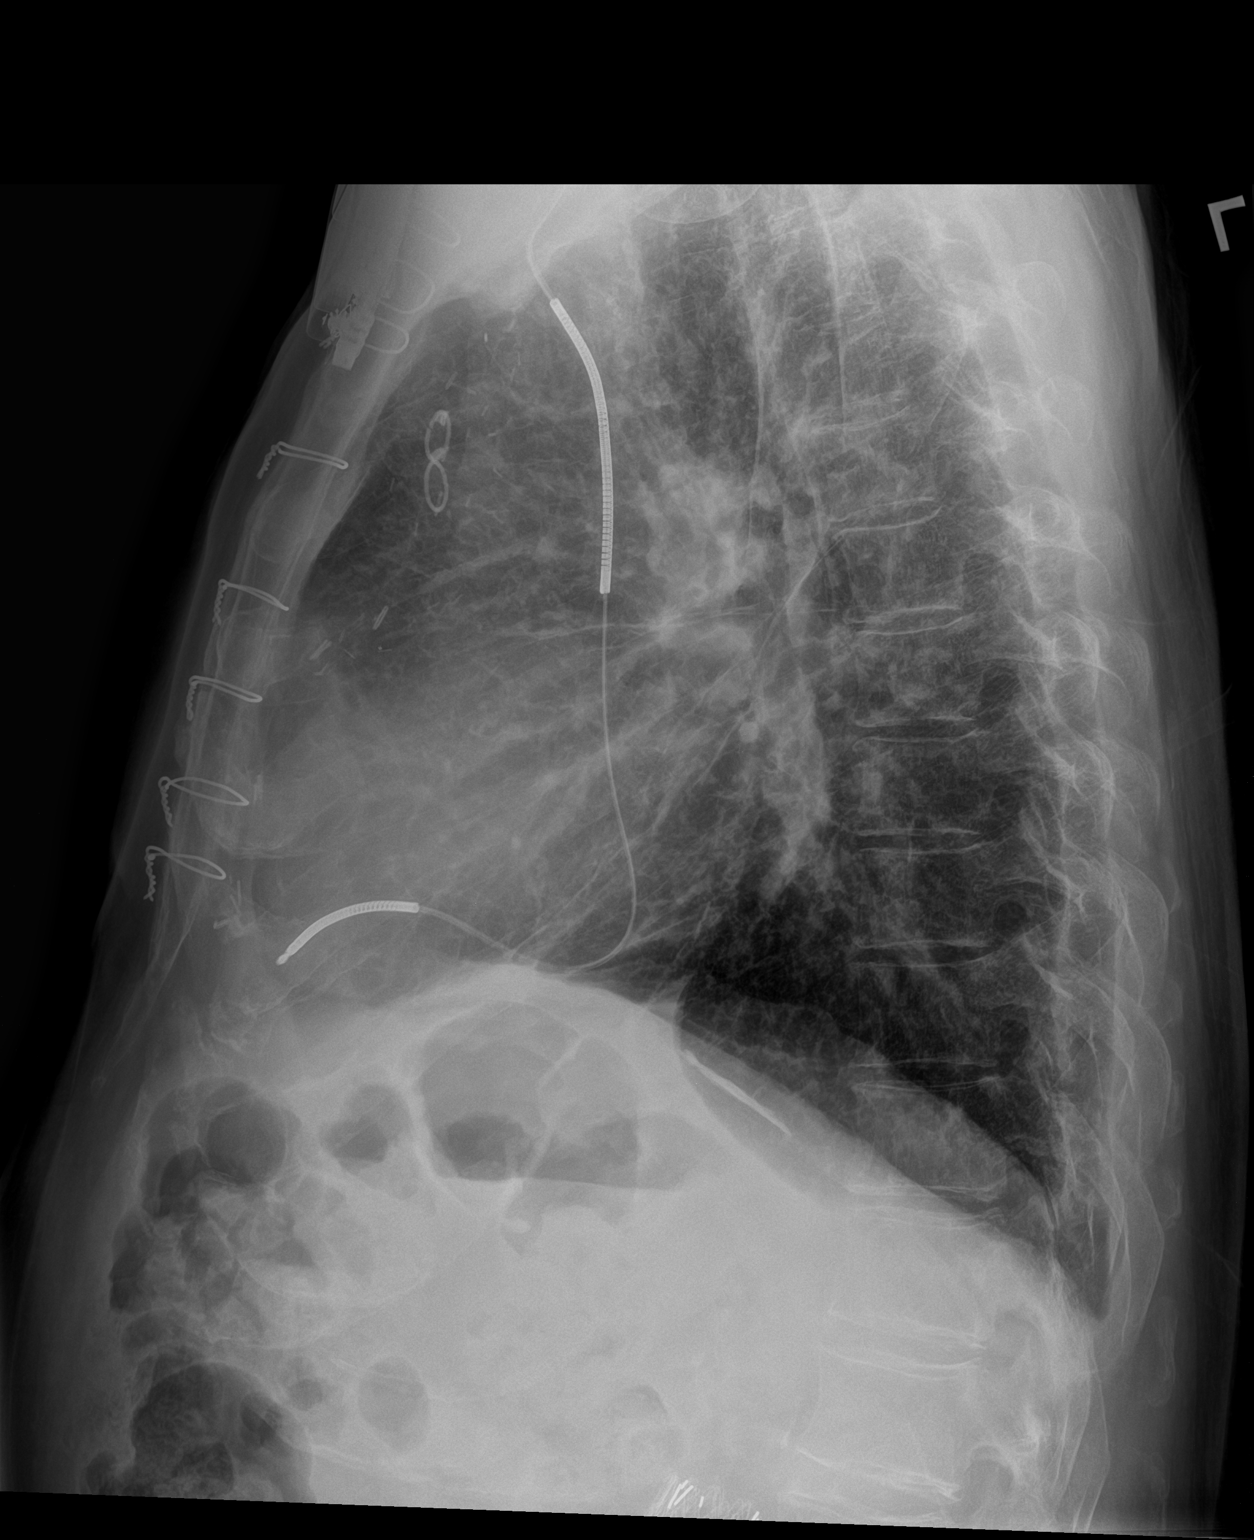

[2 of 2 positions shown; findings below may reference images not displayed]

FINDINGS: Post sternotomy changes. Left-sided pacing device as before.
Cardiomegaly. No significant effusion. Diffuse bilateral
interstitial opacities without significant change since 05/07/2019.
IMPRESSION: 1. Diffuse increased interstitial opacity bilaterally without
significant change since 05/07/2019, suspect for acute interstitial
inflammatory process on underlying chronic disease.
2. Cardiomegaly

## 2021-09-27 IMAGING — DX DG CHEST 2V
2 series · 2 of 2 positions shown · non-contrast
Comparison: 06/17/2019

CLINICAL DATA: Follow-up

EXAM:
CHEST - 2 VIEW

[chest pa]
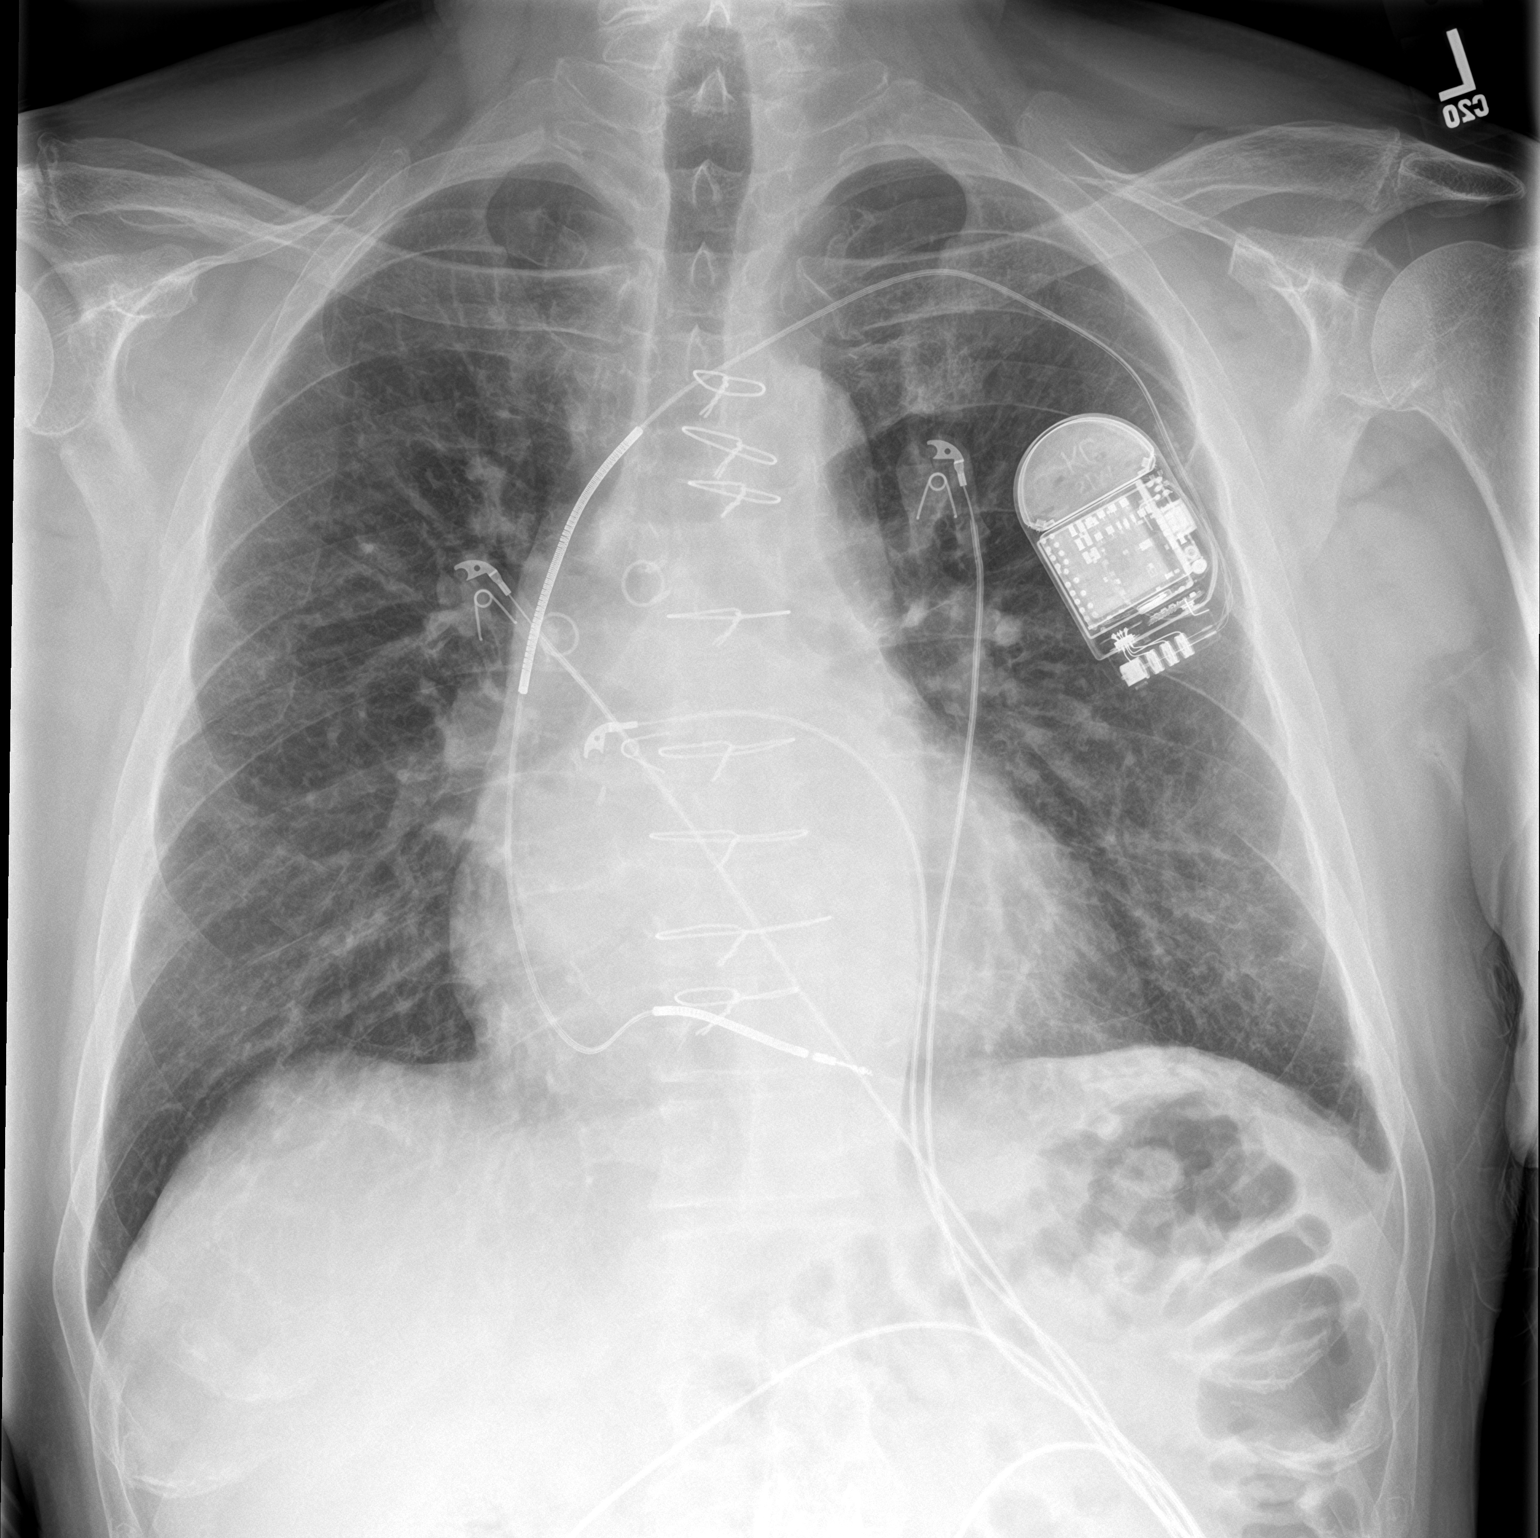

[chest lat]
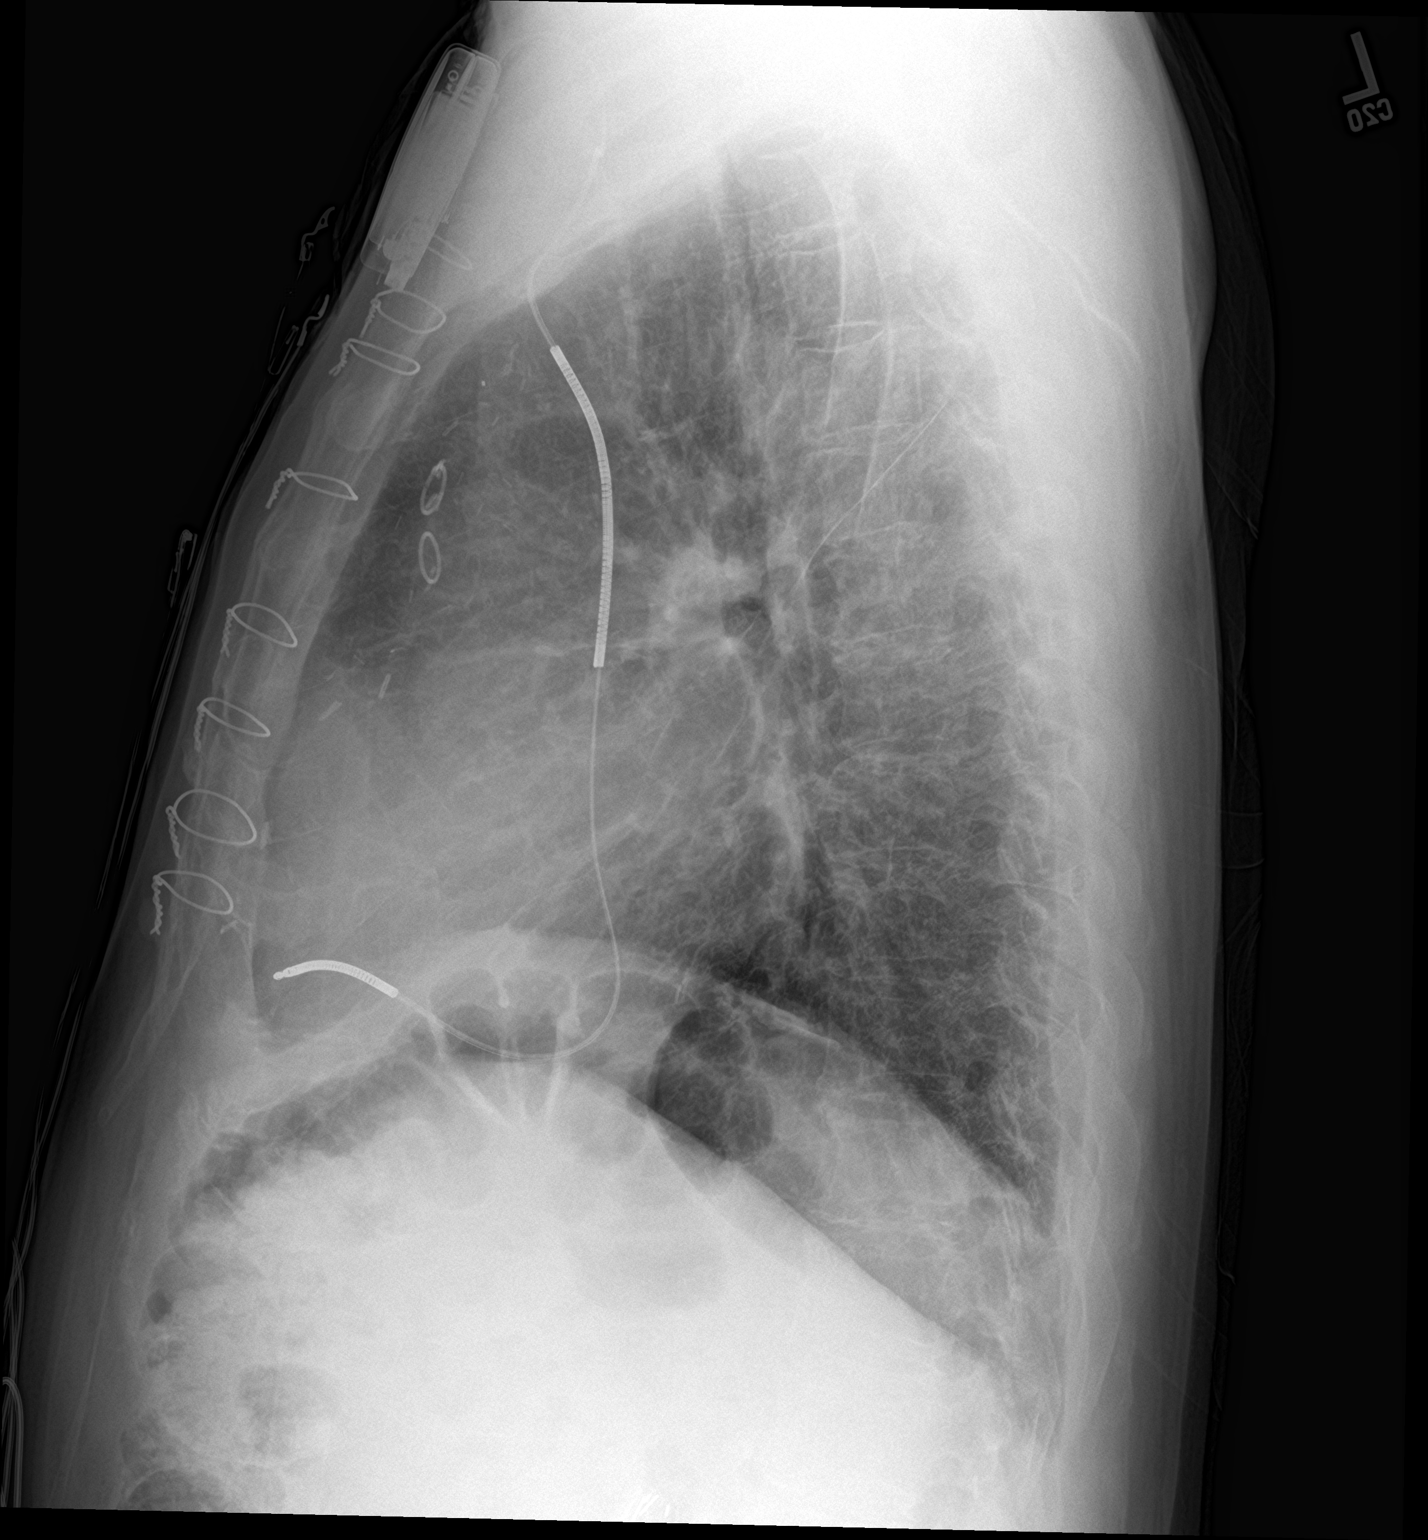

[2 of 2 positions shown; findings below may reference images not displayed]

FINDINGS: Cardiomegaly status post median sternotomy and CABG with left chest
pacer defibrillator. Mild, diffuse interstitial pulmonary opacity,
slightly improved compared to prior examination. Visualized skeletal
structures are unremarkable.
IMPRESSION: Cardiomegaly with mild, diffuse interstitial pulmonary opacity
slightly improved compared to prior examination, consistent with
improved edema. No new or focal airspace opacity.

## 2021-11-03 IMAGING — US IR FLUORO GUIDE CV LINE*R*
1 series · 1 of 1 positions shown · non-contrast
Comparison: none

INDICATION: CHF, chronic Milrinone access

[Series 1: ir fluoro guide cv line*right* · 1 of 1 slices shown]
[im 1/1]
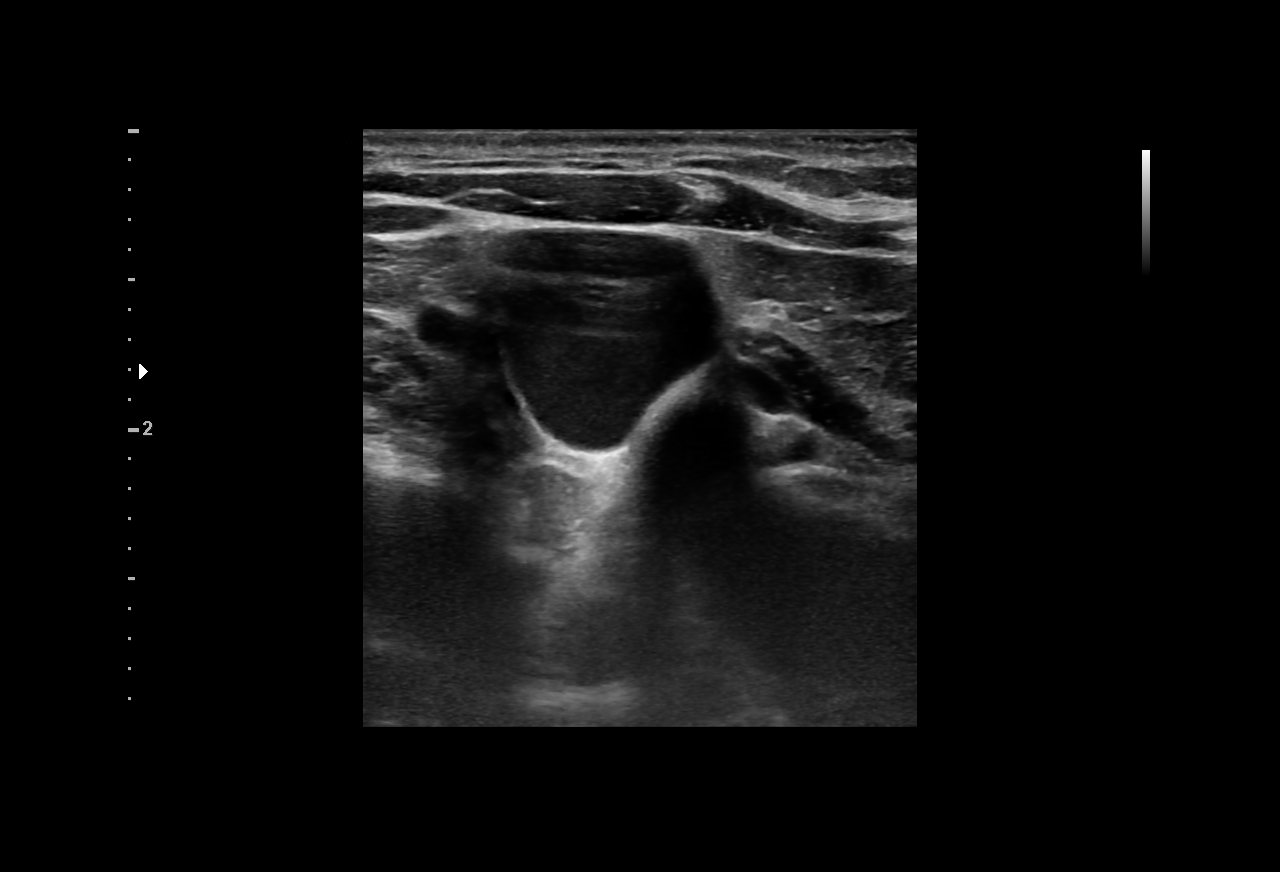

[1 of 1 positions shown; findings below may reference images not displayed]

EXAM:
TUNNELED PICC LINE WITH ULTRASOUND AND FLUOROSCOPIC GUIDANCE

MEDICATIONS:
1% lidocaine local.

ANESTHESIA/SEDATION:
Moderate Sedation Time:  None.

The patient was continuously monitored during the procedure by the
interventional radiology nurse under my direct supervision.

FLUOROSCOPY TIME:  Fluoroscopy Time: 0 minutes 18 seconds (1 mGy).

COMPLICATIONS:
None immediate.



After creating a small venotomy incision, a micropuncture kit was
utilized to access the right internal jugular vein under direct,
real-time ultrasound guidance after the overlying soft tissues were
anesthetized with 1% lidocaine with epinephrine. Ultrasound image
documentation was performed. The microwire was kinked to measure
appropriate catheter length. The micropuncture sheath was exchanged
for a peel-away sheath over a guidewire. A 5 French dual lumen
tunneled PICC measuring 27 cm was tunneled in a retrograde fashion
from the anterior chest wall to the venotomy incision.

The catheter was then placed through the peel-away sheath with tip
ultimately positioned at the superior caval-atrial junction. Final
catheter positioning was confirmed and documented with a spot
radiographic image. The catheter aspirates and flushes normally. The
catheter was flushed with appropriate volume heparin dwells.

The catheter exit site was secured with a 2-0 Ethilon retention
suture. The venotomy incision was closed with Dermabond. Dressings
were applied. The patient tolerated the procedure well without
immediate post procedural complication.
FINDINGS: After catheter placement, the tip lies within the superior
cavoatrial junction. The catheter aspirates and flushes normally and
is ready for immediate use.
IMPRESSION: Successful placement of 27cm dual lumen tunneled PICC catheter via
the right internal jugular vein with tip terminating at the superior
caval atrial junction. The catheter is ready for immediate use.
